# Patient Record
Sex: Female | Born: 1970 | Race: White | Hispanic: No | Smoking: Current every day smoker
Health system: Southern US, Community
[De-identification: ages and names within clinical notes are randomized; demographics above are authoritative.]

## PROBLEM LIST (undated history)

## (undated) DIAGNOSIS — Z720 Tobacco use: Secondary | ICD-10-CM

## (undated) DIAGNOSIS — E785 Hyperlipidemia, unspecified: Secondary | ICD-10-CM

## (undated) DIAGNOSIS — E669 Obesity, unspecified: Secondary | ICD-10-CM

## (undated) DIAGNOSIS — E119 Type 2 diabetes mellitus without complications: Secondary | ICD-10-CM

## (undated) DIAGNOSIS — K219 Gastro-esophageal reflux disease without esophagitis: Secondary | ICD-10-CM

## (undated) DIAGNOSIS — F32A Depression, unspecified: Secondary | ICD-10-CM

---

## 2020-11-25 HISTORY — PX: ABCESS DRAINAGE: SHX399

## 2020-12-28 ENCOUNTER — Inpatient Hospital Stay (HOSPITAL_COMMUNITY)
Admission: AD | Admit: 2020-12-28 | Discharge: 2021-01-19 | DRG: 853 | Disposition: A | Discharge status: Home Health | Payer: Medicaid Other | Source: Other Acute Inpatient Hospital | Attending: Family Medicine | Admitting: Family Medicine

## 2020-12-28 ENCOUNTER — Inpatient Hospital Stay (HOSPITAL_COMMUNITY): Payer: Medicaid Other | Admitting: Anesthesiology

## 2020-12-28 ENCOUNTER — Inpatient Hospital Stay (HOSPITAL_COMMUNITY): Payer: Medicaid Other

## 2020-12-28 ENCOUNTER — Encounter (HOSPITAL_COMMUNITY): Payer: Self-pay | Admitting: Critical Care Medicine

## 2020-12-28 ENCOUNTER — Encounter (HOSPITAL_COMMUNITY)
Admission: AD | Disposition: A | Discharge status: Home Health | Payer: Self-pay | Source: Other Acute Inpatient Hospital | Attending: Pulmonary Disease

## 2020-12-28 DIAGNOSIS — I071 Rheumatic tricuspid insufficiency: Secondary | ICD-10-CM | POA: Diagnosis present

## 2020-12-28 DIAGNOSIS — F17210 Nicotine dependence, cigarettes, uncomplicated: Secondary | ICD-10-CM | POA: Diagnosis present

## 2020-12-28 DIAGNOSIS — L03317 Cellulitis of buttock: Secondary | ICD-10-CM | POA: Diagnosis present

## 2020-12-28 DIAGNOSIS — A483 Toxic shock syndrome: Secondary | ICD-10-CM | POA: Diagnosis present

## 2020-12-28 DIAGNOSIS — B9620 Unspecified Escherichia coli [E. coli] as the cause of diseases classified elsewhere: Secondary | ICD-10-CM | POA: Diagnosis present

## 2020-12-28 DIAGNOSIS — R0609 Other forms of dyspnea: Secondary | ICD-10-CM | POA: Diagnosis not present

## 2020-12-28 DIAGNOSIS — N170 Acute kidney failure with tubular necrosis: Secondary | ICD-10-CM | POA: Diagnosis present

## 2020-12-28 DIAGNOSIS — Z9289 Personal history of other medical treatment: Secondary | ICD-10-CM | POA: Diagnosis not present

## 2020-12-28 DIAGNOSIS — I4891 Unspecified atrial fibrillation: Secondary | ICD-10-CM | POA: Diagnosis not present

## 2020-12-28 DIAGNOSIS — Y92239 Unspecified place in hospital as the place of occurrence of the external cause: Secondary | ICD-10-CM | POA: Diagnosis not present

## 2020-12-28 DIAGNOSIS — R1312 Dysphagia, oropharyngeal phase: Secondary | ICD-10-CM | POA: Diagnosis present

## 2020-12-28 DIAGNOSIS — E1165 Type 2 diabetes mellitus with hyperglycemia: Secondary | ICD-10-CM | POA: Diagnosis present

## 2020-12-28 DIAGNOSIS — A401 Sepsis due to streptococcus, group B: Principal | ICD-10-CM

## 2020-12-28 DIAGNOSIS — E785 Hyperlipidemia, unspecified: Secondary | ICD-10-CM | POA: Diagnosis present

## 2020-12-28 DIAGNOSIS — M7989 Other specified soft tissue disorders: Secondary | ICD-10-CM | POA: Diagnosis not present

## 2020-12-28 DIAGNOSIS — Z9049 Acquired absence of other specified parts of digestive tract: Secondary | ICD-10-CM

## 2020-12-28 DIAGNOSIS — Z79899 Other long term (current) drug therapy: Secondary | ICD-10-CM

## 2020-12-28 DIAGNOSIS — I5041 Acute combined systolic (congestive) and diastolic (congestive) heart failure: Secondary | ICD-10-CM | POA: Diagnosis not present

## 2020-12-28 DIAGNOSIS — E669 Obesity, unspecified: Secondary | ICD-10-CM

## 2020-12-28 DIAGNOSIS — F1120 Opioid dependence, uncomplicated: Secondary | ICD-10-CM | POA: Diagnosis present

## 2020-12-28 DIAGNOSIS — Z794 Long term (current) use of insulin: Secondary | ICD-10-CM

## 2020-12-28 DIAGNOSIS — R32 Unspecified urinary incontinence: Secondary | ICD-10-CM | POA: Diagnosis not present

## 2020-12-28 DIAGNOSIS — Z0189 Encounter for other specified special examinations: Secondary | ICD-10-CM

## 2020-12-28 DIAGNOSIS — D62 Acute posthemorrhagic anemia: Secondary | ICD-10-CM | POA: Diagnosis not present

## 2020-12-28 DIAGNOSIS — J9811 Atelectasis: Secondary | ICD-10-CM | POA: Diagnosis not present

## 2020-12-28 DIAGNOSIS — F32A Depression, unspecified: Secondary | ICD-10-CM | POA: Diagnosis present

## 2020-12-28 DIAGNOSIS — E871 Hypo-osmolality and hyponatremia: Secondary | ICD-10-CM | POA: Diagnosis present

## 2020-12-28 DIAGNOSIS — R34 Anuria and oliguria: Secondary | ICD-10-CM | POA: Diagnosis not present

## 2020-12-28 DIAGNOSIS — N179 Acute kidney failure, unspecified: Secondary | ICD-10-CM | POA: Diagnosis not present

## 2020-12-28 DIAGNOSIS — Z1611 Resistance to penicillins: Secondary | ICD-10-CM | POA: Diagnosis present

## 2020-12-28 DIAGNOSIS — E875 Hyperkalemia: Secondary | ICD-10-CM

## 2020-12-28 DIAGNOSIS — E874 Mixed disorder of acid-base balance: Secondary | ICD-10-CM | POA: Diagnosis present

## 2020-12-28 DIAGNOSIS — D6489 Other specified anemias: Secondary | ICD-10-CM | POA: Diagnosis present

## 2020-12-28 DIAGNOSIS — R680 Hypothermia, not associated with low environmental temperature: Secondary | ICD-10-CM | POA: Diagnosis not present

## 2020-12-28 DIAGNOSIS — W06XXXA Fall from bed, initial encounter: Secondary | ICD-10-CM | POA: Diagnosis not present

## 2020-12-28 DIAGNOSIS — Z8616 Personal history of COVID-19: Secondary | ICD-10-CM | POA: Diagnosis not present

## 2020-12-28 DIAGNOSIS — R509 Fever, unspecified: Secondary | ICD-10-CM | POA: Diagnosis not present

## 2020-12-28 DIAGNOSIS — A419 Sepsis, unspecified organism: Secondary | ICD-10-CM | POA: Diagnosis present

## 2020-12-28 DIAGNOSIS — E11649 Type 2 diabetes mellitus with hypoglycemia without coma: Secondary | ICD-10-CM | POA: Diagnosis not present

## 2020-12-28 DIAGNOSIS — M726 Necrotizing fasciitis: Secondary | ICD-10-CM | POA: Diagnosis not present

## 2020-12-28 DIAGNOSIS — I483 Typical atrial flutter: Secondary | ICD-10-CM | POA: Diagnosis not present

## 2020-12-28 DIAGNOSIS — Z4659 Encounter for fitting and adjustment of other gastrointestinal appliance and device: Secondary | ICD-10-CM

## 2020-12-28 DIAGNOSIS — K219 Gastro-esophageal reflux disease without esophagitis: Secondary | ICD-10-CM | POA: Diagnosis present

## 2020-12-28 DIAGNOSIS — Z6841 Body Mass Index (BMI) 40.0 and over, adult: Secondary | ICD-10-CM

## 2020-12-28 DIAGNOSIS — F419 Anxiety disorder, unspecified: Secondary | ICD-10-CM | POA: Diagnosis present

## 2020-12-28 DIAGNOSIS — J9601 Acute respiratory failure with hypoxia: Secondary | ICD-10-CM | POA: Diagnosis not present

## 2020-12-28 DIAGNOSIS — R6521 Severe sepsis with septic shock: Secondary | ICD-10-CM | POA: Diagnosis not present

## 2020-12-28 DIAGNOSIS — F1320 Sedative, hypnotic or anxiolytic dependence, uncomplicated: Secondary | ICD-10-CM | POA: Diagnosis present

## 2020-12-28 DIAGNOSIS — G9341 Metabolic encephalopathy: Secondary | ICD-10-CM

## 2020-12-28 DIAGNOSIS — Z452 Encounter for adjustment and management of vascular access device: Secondary | ICD-10-CM

## 2020-12-28 DIAGNOSIS — J9602 Acute respiratory failure with hypercapnia: Secondary | ICD-10-CM

## 2020-12-28 DIAGNOSIS — L0231 Cutaneous abscess of buttock: Secondary | ICD-10-CM | POA: Diagnosis present

## 2020-12-28 DIAGNOSIS — E878 Other disorders of electrolyte and fluid balance, not elsewhere classified: Secondary | ICD-10-CM | POA: Diagnosis present

## 2020-12-28 DIAGNOSIS — I4892 Unspecified atrial flutter: Secondary | ICD-10-CM | POA: Diagnosis not present

## 2020-12-28 DIAGNOSIS — I110 Hypertensive heart disease with heart failure: Secondary | ICD-10-CM | POA: Diagnosis present

## 2020-12-28 DIAGNOSIS — R197 Diarrhea, unspecified: Secondary | ICD-10-CM | POA: Diagnosis not present

## 2020-12-28 HISTORY — DX: Type 2 diabetes mellitus without complications: E11.9

## 2020-12-28 HISTORY — DX: Obesity, unspecified: E66.9

## 2020-12-28 HISTORY — DX: Gastro-esophageal reflux disease without esophagitis: K21.9

## 2020-12-28 HISTORY — PX: IRRIGATION AND DEBRIDEMENT BUTTOCKS: SHX6601

## 2020-12-28 HISTORY — DX: Tobacco use: Z72.0

## 2020-12-28 HISTORY — DX: Hyperlipidemia, unspecified: E78.5

## 2020-12-28 HISTORY — DX: Depression, unspecified: F32.A

## 2020-12-28 LAB — CBC
HCT: 32.7 % — ABNORMAL LOW (ref 36.0–46.0)
Hemoglobin: 11.4 g/dL — ABNORMAL LOW (ref 12.0–15.0)
MCH: 31.6 pg (ref 26.0–34.0)
MCHC: 34.9 g/dL (ref 30.0–36.0)
MCV: 90.6 fL (ref 80.0–100.0)
Platelets: 195 10*3/uL (ref 150–400)
RBC: 3.61 MIL/uL — ABNORMAL LOW (ref 3.87–5.11)
RDW: 13.5 % (ref 11.5–15.5)
WBC: 52.3 10*3/uL (ref 4.0–10.5)
nRBC: 0.1 % (ref 0.0–0.2)

## 2020-12-28 LAB — COMPREHENSIVE METABOLIC PANEL
ALT: 19 U/L (ref 0–44)
AST: 40 U/L (ref 15–41)
Albumin: 1.6 g/dL — ABNORMAL LOW (ref 3.5–5.0)
Alkaline Phosphatase: 422 U/L — ABNORMAL HIGH (ref 38–126)
Anion gap: 19 — ABNORMAL HIGH (ref 5–15)
BUN: 45 mg/dL — ABNORMAL HIGH (ref 6–20)
CO2: 12 mmol/L — ABNORMAL LOW (ref 22–32)
Calcium: 8.7 mg/dL — ABNORMAL LOW (ref 8.9–10.3)
Chloride: 100 mmol/L (ref 98–111)
Creatinine, Ser: 5.97 mg/dL — ABNORMAL HIGH (ref 0.44–1.00)
GFR, Estimated: 8 mL/min — ABNORMAL LOW (ref >60)
Glucose, Bld: 133 mg/dL — ABNORMAL HIGH (ref 70–99)
Potassium: 4 mmol/L (ref 3.5–5.1)
Sodium: 131 mmol/L — ABNORMAL LOW (ref 135–145)
Total Bilirubin: 6.6 mg/dL — ABNORMAL HIGH (ref 0.3–1.2)
Total Protein: 6.1 g/dL — ABNORMAL LOW (ref 6.5–8.1)

## 2020-12-28 LAB — LACTIC ACID, PLASMA
Lactic Acid, Venous: 3.3 mmol/L (ref 0.5–1.9)
Lactic Acid, Venous: 3.4 mmol/L (ref 0.5–1.9)

## 2020-12-28 LAB — POCT I-STAT 7, (LYTES, BLD GAS, ICA,H+H)
Acid-base deficit: 14 mmol/L — ABNORMAL HIGH (ref 0.0–2.0)
Bicarbonate: 16.8 mmol/L — ABNORMAL LOW (ref 20.0–28.0)
Calcium, Ion: 1.12 mmol/L — ABNORMAL LOW (ref 1.15–1.40)
HCT: 36 % (ref 36.0–46.0)
Hemoglobin: 12.2 g/dL (ref 12.0–15.0)
O2 Saturation: 99 %
Patient temperature: 97.6
Potassium: 3.8 mmol/L (ref 3.5–5.1)
Sodium: 135 mmol/L (ref 135–145)
TCO2: 19 mmol/L — ABNORMAL LOW (ref 22–32)
pCO2 arterial: 63.1 mmHg — ABNORMAL HIGH (ref 32.0–48.0)
pH, Arterial: 7.03 — CL (ref 7.350–7.450)
pO2, Arterial: 169 mmHg — ABNORMAL HIGH (ref 83.0–108.0)

## 2020-12-28 LAB — GLUCOSE, CAPILLARY
Glucose-Capillary: 152 mg/dL — ABNORMAL HIGH (ref 70–99)
Glucose-Capillary: 195 mg/dL — ABNORMAL HIGH (ref 70–99)

## 2020-12-28 LAB — HIV ANTIBODY (ROUTINE TESTING W REFLEX): HIV Screen 4th Generation wRfx: NONREACTIVE

## 2020-12-28 LAB — TYPE AND SCREEN
ABO/RH(D): O NEG
Antibody Screen: NEGATIVE

## 2020-12-28 LAB — BRAIN NATRIURETIC PEPTIDE: B Natriuretic Peptide: 1323.6 pg/mL — ABNORMAL HIGH (ref 0.0–100.0)

## 2020-12-28 LAB — PROTIME-INR
INR: 1.3 — ABNORMAL HIGH (ref 0.8–1.2)
Prothrombin Time: 16.4 seconds — ABNORMAL HIGH (ref 11.4–15.2)

## 2020-12-28 LAB — PROCALCITONIN: Procalcitonin: 17.91 ng/mL

## 2020-12-28 LAB — MRSA PCR SCREENING: MRSA by PCR: NEGATIVE

## 2020-12-28 LAB — MAGNESIUM: Magnesium: 1.6 mg/dL — ABNORMAL LOW (ref 1.7–2.4)

## 2020-12-28 LAB — ABO/RH: ABO/RH(D): O NEG

## 2020-12-28 SURGERY — IRRIGATION AND DEBRIDEMENT BUTTOCKS
Anesthesia: General | Site: Buttocks | Laterality: Left

## 2020-12-28 MED ORDER — NOREPINEPHRINE 4 MG/250ML-% IV SOLN: 0.0000 ug/min | INTRAVENOUS | Status: DC

## 2020-12-28 MED ORDER — LIDOCAINE 2% (20 MG/ML) 5 ML SYRINGE
INTRAMUSCULAR | Status: AC
  Filled 2020-12-28: qty 5

## 2020-12-28 MED ORDER — PROPOFOL 10 MG/ML IV BOLUS
INTRAVENOUS | Status: AC
  Filled 2020-12-28: qty 20

## 2020-12-28 MED ORDER — FENTANYL CITRATE (PF) 100 MCG/2ML IJ SOLN
INTRAMUSCULAR | Status: AC
  Administered 2020-12-28: 50 ug
  Filled 2020-12-28: qty 2

## 2020-12-28 MED ORDER — LIDOCAINE HCL (CARDIAC) PF 100 MG/5ML IV SOSY
PREFILLED_SYRINGE | INTRAVENOUS | Status: DC | PRN
  Administered 2020-12-28: 60 mg via INTRAVENOUS

## 2020-12-28 MED ORDER — SODIUM BICARBONATE 8.4 % IV SOLN
200.0000 meq | Freq: Once | INTRAVENOUS | Status: AC
  Administered 2020-12-28: 200 meq via INTRAVENOUS

## 2020-12-28 MED ORDER — SODIUM CHLORIDE 0.9 % IV SOLN
1.0000 g | Freq: Three times a day (TID) | INTRAVENOUS | Status: DC
  Filled 2020-12-28 (×3): qty 1

## 2020-12-28 MED ORDER — MIDAZOLAM HCL 2 MG/2ML IJ SOLN
INTRAMUSCULAR | Status: AC
  Filled 2020-12-28: qty 2

## 2020-12-28 MED ORDER — SODIUM CHLORIDE 0.9 % IV SOLN
250.0000 mL | INTRAVENOUS | Status: DC
  Administered 2020-12-28: 10 mL via INTRAVENOUS
  Administered 2021-01-01: 250 mL via INTRAVENOUS
  Administered 2021-01-01: 1000 mL via INTRAVENOUS
  Administered 2021-01-05 – 2021-01-07 (×2): 250 mL via INTRAVENOUS

## 2020-12-28 MED ORDER — ROCURONIUM BROMIDE 100 MG/10ML IV SOLN
INTRAVENOUS | Status: DC | PRN
  Administered 2020-12-28: 50 mg via INTRAVENOUS
  Administered 2020-12-28: 20 mg via INTRAVENOUS

## 2020-12-28 MED ORDER — PHENYLEPHRINE HCL (PRESSORS) 10 MG/ML IV SOLN
INTRAVENOUS | Status: DC | PRN
  Administered 2020-12-28 (×2): 80 ug via INTRAVENOUS

## 2020-12-28 MED ORDER — DOCUSATE SODIUM 100 MG PO CAPS: 100.0000 mg | ORAL_CAPSULE | Freq: Two times a day (BID) | ORAL | Status: DC | PRN

## 2020-12-28 MED ORDER — SODIUM BICARBONATE 8.4 % IV SOLN
INTRAVENOUS | Status: AC
  Filled 2020-12-28: qty 200

## 2020-12-28 MED ORDER — INSULIN ASPART 100 UNIT/ML IJ SOLN
0.0000 [IU] | INTRAMUSCULAR | Status: DC
  Administered 2020-12-28 – 2020-12-30 (×9): 3 [IU] via SUBCUTANEOUS
  Administered 2020-12-30 (×5): 5 [IU] via SUBCUTANEOUS
  Administered 2020-12-31: 3 [IU] via SUBCUTANEOUS
  Administered 2020-12-31: 8 [IU] via SUBCUTANEOUS

## 2020-12-28 MED ORDER — FENTANYL CITRATE (PF) 250 MCG/5ML IJ SOLN
INTRAMUSCULAR | Status: AC
  Filled 2020-12-28: qty 5

## 2020-12-28 MED ORDER — LINEZOLID 600 MG/300ML IV SOLN
600.0000 mg | Freq: Two times a day (BID) | INTRAVENOUS | Status: DC
  Administered 2020-12-28 – 2021-01-01 (×9): 600 mg via INTRAVENOUS
  Filled 2020-12-28 (×11): qty 300

## 2020-12-28 MED ORDER — HEPARIN (PORCINE) 2000 UNITS/L FOR CRRT
INTRAVENOUS_CENTRAL | Status: DC | PRN
  Filled 2020-12-28 (×5): qty 1000

## 2020-12-28 MED ORDER — ONDANSETRON HCL 4 MG/2ML IJ SOLN: 4.0000 mg | Freq: Four times a day (QID) | INTRAMUSCULAR | Status: DC | PRN

## 2020-12-28 MED ORDER — DEXAMETHASONE SODIUM PHOSPHATE 10 MG/ML IJ SOLN
INTRAMUSCULAR | Status: DC | PRN
  Administered 2020-12-28: 5 mg via INTRAVENOUS

## 2020-12-28 MED ORDER — 0.9 % SODIUM CHLORIDE (POUR BTL) OPTIME
TOPICAL | Status: DC | PRN
  Administered 2020-12-28: 1000 mL

## 2020-12-28 MED ORDER — SODIUM BICARBONATE 8.4 % IV SOLN
50.0000 meq | Freq: Once | INTRAVENOUS | Status: AC
  Administered 2020-12-28: 50 meq via INTRAVENOUS
  Filled 2020-12-28: qty 50

## 2020-12-28 MED ORDER — FENTANYL CITRATE (PF) 100 MCG/2ML IJ SOLN
INTRAMUSCULAR | Status: DC | PRN
  Administered 2020-12-28: 150 ug via INTRAVENOUS
  Administered 2020-12-28: 50 ug via INTRAVENOUS

## 2020-12-28 MED ORDER — NOREPINEPHRINE 4 MG/250ML-% IV SOLN
0.0000 ug/min | INTRAVENOUS | Status: DC
  Administered 2020-12-28: 13 ug/min via INTRAVENOUS

## 2020-12-28 MED ORDER — POLYETHYLENE GLYCOL 3350 17 G PO PACK: 17.0000 g | PACK | Freq: Every day | ORAL | Status: DC | PRN

## 2020-12-28 MED ORDER — STERILE WATER FOR INJECTION IV SOLN
INTRAVENOUS | Status: DC
  Filled 2020-12-28 (×2): qty 1000

## 2020-12-28 MED ORDER — PRISMASOL BGK 4/2.5 32-4-2.5 MEQ/L REPLACEMENT SOLN
Status: DC
  Filled 2020-12-28 (×3): qty 5000

## 2020-12-28 MED ORDER — PROPOFOL 10 MG/ML IV BOLUS
INTRAVENOUS | Status: DC | PRN
  Administered 2020-12-28: 150 mg via INTRAVENOUS

## 2020-12-28 MED ORDER — FENTANYL CITRATE (PF) 100 MCG/2ML IJ SOLN
25.0000 ug | INTRAMUSCULAR | Status: DC | PRN
  Administered 2020-12-28 – 2020-12-29 (×3): 50 ug via INTRAVENOUS

## 2020-12-28 MED ORDER — SODIUM BICARBONATE 8.4 % IV SOLN
INTRAVENOUS | Status: AC
  Filled 2020-12-28: qty 50

## 2020-12-28 MED ORDER — HEPARIN SODIUM (PORCINE) 5000 UNIT/ML IJ SOLN
5000.0000 [IU] | Freq: Three times a day (TID) | INTRAMUSCULAR | Status: DC
  Administered 2020-12-28 – 2021-01-08 (×32): 5000 [IU] via SUBCUTANEOUS
  Filled 2020-12-28 (×32): qty 1

## 2020-12-28 MED ORDER — HEPARIN SODIUM (PORCINE) 1000 UNIT/ML DIALYSIS
1000.0000 [IU] | INTRAMUSCULAR | Status: DC | PRN
  Administered 2020-12-28: 1000 [IU] via INTRAVENOUS_CENTRAL
  Administered 2020-12-31 – 2021-01-03 (×2): 2400 [IU] via INTRAVENOUS_CENTRAL
  Filled 2020-12-28: qty 6
  Filled 2020-12-28: qty 5
  Filled 2020-12-28 (×2): qty 6
  Filled 2020-12-28: qty 3
  Filled 2020-12-28: qty 4
  Filled 2020-12-28: qty 6

## 2020-12-28 MED ORDER — ORAL CARE MOUTH RINSE
15.0000 mL | OROMUCOSAL | Status: DC
  Administered 2020-12-28 – 2021-01-06 (×85): 15 mL via OROMUCOSAL

## 2020-12-28 MED ORDER — SODIUM CHLORIDE 0.9 % IV SOLN
1.0000 g | INTRAVENOUS | Status: DC
  Filled 2020-12-28: qty 1

## 2020-12-28 MED ORDER — VASOPRESSIN 20 UNITS/100 ML INFUSION FOR SHOCK
0.0000 [IU]/min | INTRAVENOUS | Status: DC
Start: 2020-12-28 — End: 2020-12-31
  Administered 2020-12-28 – 2020-12-30 (×4): 0.03 [IU]/min via INTRAVENOUS
  Filled 2020-12-28 (×4): qty 100

## 2020-12-28 MED ORDER — CHLORHEXIDINE GLUCONATE CLOTH 2 % EX PADS
6.0000 | MEDICATED_PAD | Freq: Every day | CUTANEOUS | Status: DC
  Administered 2020-12-28 – 2021-01-18 (×21): 6 via TOPICAL

## 2020-12-28 MED ORDER — STERILE WATER FOR IRRIGATION IR SOLN
Status: DC | PRN
  Administered 2020-12-28: 300 mL

## 2020-12-28 MED ORDER — CHLORHEXIDINE GLUCONATE 0.12% ORAL RINSE (MEDLINE KIT)
15.0000 mL | Freq: Two times a day (BID) | OROMUCOSAL | Status: DC
  Administered 2020-12-28 – 2021-01-06 (×18): 15 mL via OROMUCOSAL

## 2020-12-28 MED ORDER — ONDANSETRON HCL 4 MG/2ML IJ SOLN
INTRAMUSCULAR | Status: DC | PRN
  Administered 2020-12-28: 4 mg via INTRAVENOUS

## 2020-12-28 MED ORDER — FENTANYL 2500MCG IN NS 250ML (10MCG/ML) PREMIX INFUSION
0.0000 ug/h | INTRAVENOUS | Status: DC
  Administered 2020-12-28: 50 ug/h via INTRAVENOUS
  Administered 2020-12-29: 400 ug/h via INTRAVENOUS
  Filled 2020-12-28 (×2): qty 250

## 2020-12-28 MED ORDER — SODIUM CHLORIDE 0.9 % IV SOLN: INTRAVENOUS | Status: DC | PRN

## 2020-12-28 MED ORDER — MIDAZOLAM HCL 5 MG/5ML IJ SOLN
INTRAMUSCULAR | Status: DC | PRN
  Administered 2020-12-28 (×2): 1 mg via INTRAVENOUS
  Administered 2020-12-28: 2 mg via INTRAVENOUS

## 2020-12-28 MED ORDER — PRISMASOL BGK 4/2.5 32-4-2.5 MEQ/L EC SOLN
Status: DC
  Filled 2020-12-28 (×46): qty 5000

## 2020-12-28 MED ORDER — SODIUM BICARBONATE 8.4 % IV SOLN
INTRAVENOUS | Status: DC | PRN
  Administered 2020-12-28: 50 mL via INTRAVENOUS

## 2020-12-28 MED ORDER — SODIUM CHLORIDE 0.9 % IV SOLN
1.0000 g | Freq: Three times a day (TID) | INTRAVENOUS | Status: DC
  Administered 2020-12-28 – 2020-12-31 (×8): 1 g via INTRAVENOUS
  Filled 2020-12-28 (×10): qty 1

## 2020-12-28 MED ORDER — MAGNESIUM SULFATE 2 GM/50ML IV SOLN
2.0000 g | Freq: Once | INTRAVENOUS | Status: AC
  Administered 2020-12-28: 2 g via INTRAVENOUS
  Filled 2020-12-28: qty 50

## 2020-12-28 MED ORDER — NOREPINEPHRINE 4 MG/250ML-% IV SOLN
2.0000 ug/min | INTRAVENOUS | Status: DC
  Administered 2020-12-28: 12 ug/min via INTRAVENOUS
  Filled 2020-12-28 (×2): qty 250

## 2020-12-28 MED ORDER — PRISMASOL BGK 4/2.5 32-4-2.5 MEQ/L REPLACEMENT SOLN
Status: DC
  Filled 2020-12-28 (×12): qty 5000

## 2020-12-28 SURGICAL SUPPLY — 47 items
BLADE SURG 10 STRL SS (BLADE) ×3 IMPLANT
BLADE SURG 15 STRL LF DISP TIS (BLADE) ×1 IMPLANT
BLADE SURG 15 STRL SS (BLADE) ×2
BNDG GAUZE ELAST 4 BULKY (GAUZE/BANDAGES/DRESSINGS) ×3 IMPLANT
CANISTER SUCT 3000ML PPV (MISCELLANEOUS) ×3 IMPLANT
CLEANER TIP ELECTROSURG 2X2 (MISCELLANEOUS) ×3 IMPLANT
CNTNR URN SCR LID CUP LEK RST (MISCELLANEOUS) ×2 IMPLANT
CONT SPEC 4OZ STRL OR WHT (MISCELLANEOUS) ×4
COVER SURGICAL LIGHT HANDLE (MISCELLANEOUS) ×3 IMPLANT
COVER WAND RF STERILE (DRAPES) IMPLANT
DRAPE LAPAROSCOPIC ABDOMINAL (DRAPES) ×3 IMPLANT
DRAPE UTILITY XL STRL (DRAPES) ×3 IMPLANT
DRSG PAD ABDOMINAL 8X10 ST (GAUZE/BANDAGES/DRESSINGS) ×3 IMPLANT
ELECT CAUTERY BLADE 6.4 (BLADE) ×3 IMPLANT
ELECT REM PT RETURN 9FT ADLT (ELECTROSURGICAL) ×3
ELECTRODE REM PT RTRN 9FT ADLT (ELECTROSURGICAL) ×1 IMPLANT
GAUZE 4X4 16PLY RFD (DISPOSABLE) ×3 IMPLANT
GAUZE PACKING IODOFORM 1X5 (PACKING) IMPLANT
GAUZE SPONGE 4X4 12PLY STRL (GAUZE/BANDAGES/DRESSINGS) IMPLANT
GAUZE SPONGE 4X4 12PLY STRL LF (GAUZE/BANDAGES/DRESSINGS) ×6 IMPLANT
GLOVE BIO SURGEON STRL SZ8 (GLOVE) ×3 IMPLANT
GLOVE SRG 8 PF TXTR STRL LF DI (GLOVE) ×2 IMPLANT
GLOVE SURG UNDER POLY LF SZ7 (GLOVE) ×3 IMPLANT
GLOVE SURG UNDER POLY LF SZ8 (GLOVE) ×4
GOWN STRL REUS W/ TWL LRG LVL3 (GOWN DISPOSABLE) ×2 IMPLANT
GOWN STRL REUS W/ TWL XL LVL3 (GOWN DISPOSABLE) ×1 IMPLANT
GOWN STRL REUS W/TWL LRG LVL3 (GOWN DISPOSABLE) ×4
GOWN STRL REUS W/TWL XL LVL3 (GOWN DISPOSABLE) ×2
KIT BASIN OR (CUSTOM PROCEDURE TRAY) ×3 IMPLANT
KIT TURNOVER KIT B (KITS) ×3 IMPLANT
MARKER SKIN DUAL TIP RULER LAB (MISCELLANEOUS) ×3 IMPLANT
NS IRRIG 1000ML POUR BTL (IV SOLUTION) ×3 IMPLANT
PACK LITHOTOMY IV (CUSTOM PROCEDURE TRAY) ×3 IMPLANT
PAD ARMBOARD 7.5X6 YLW CONV (MISCELLANEOUS) ×3 IMPLANT
PENCIL SMOKE EVACUATOR (MISCELLANEOUS) ×3 IMPLANT
SOL PREP POV-IOD 4OZ 10% (MISCELLANEOUS) ×3 IMPLANT
SPONGE LAP 18X18 RF (DISPOSABLE) ×9 IMPLANT
SUT SILK 2 0 SH (SUTURE) ×3 IMPLANT
SWAB COLLECTION DEVICE MRSA (MISCELLANEOUS) ×3 IMPLANT
SWAB CULTURE ESWAB REG 1ML (MISCELLANEOUS) ×3 IMPLANT
SYR BULB IRRIG 60ML STRL (SYRINGE) ×3 IMPLANT
TOWEL GREEN STERILE (TOWEL DISPOSABLE) ×3 IMPLANT
TOWEL GREEN STERILE FF (TOWEL DISPOSABLE) ×3 IMPLANT
TUBE CONNECTING 12'X1/4 (SUCTIONS) ×1
TUBE CONNECTING 12X1/4 (SUCTIONS) ×2 IMPLANT
UNDERPAD 30X36 HEAVY ABSORB (UNDERPADS AND DIAPERS) ×3 IMPLANT
YANKAUER SUCT BULB TIP NO VENT (SUCTIONS) ×3 IMPLANT

## 2020-12-28 NOTE — Anesthesia Procedure Notes (Signed)
Arterial Line Insertion Start/End6/09/2020 7:55 AM, 12/28/2020 8:00 AM Performed by: Atilano Median, DO, anesthesiologist  Patient location: OR. Preanesthetic checklist: patient identified, IV checked, site marked, risks and benefits discussed, surgical consent, monitors and equipment checked, pre-op evaluation, timeout performed and anesthesia consent Lidocaine 1% used for infiltration Left, radial was placed Catheter size: 20 Fr Hand hygiene performed  and maximum sterile barriers used   Attempts: 1 Procedure performed without using ultrasound guided technique. Following insertion, dressing applied and Biopatch. Post procedure assessment: normal and unchanged  Post procedure complications: second provider assisted. Patient tolerated the procedure well with no immediate complications. Additional procedure comments: Initial attempt CRNA team unsuccessful .

## 2020-12-28 NOTE — Progress Notes (Signed)
Patient returned to 2M09 on ventilator and with a Left radial art line. Patient is on Levophed and vaso. Report taken from OR nurse.

## 2020-12-28 NOTE — Consult Note (Signed)
Reason for Consult: Acute kidney injury Referring Physician: (CCM)  HPI:  50 year old Caucasian woman with past medical history significant for type 2 diabetes mellitus not on medications following significant weight loss, obesity, depression/anxiety, gastroesophageal reflux disease and dyslipidemia.  She was admitted to [facility] on 12/23/2020 for management of left buttock cellulitis with associated septic shock.  CT scan of the pelvis with intravenous contrast at that time showed some subcutaneous gas but without an abscess and she had an incision and drainage/wound packing with initiation of intravenous vancomycin and ceftriaxone.  Wound culture grew group B streptococci (strep agalactiae).  It appears that she was taking meloxicam prior to admission that was appropriately held.  She was transferred for worsening renal failure/sepsis.  Renal ultrasound on 6/1 did not show any hydronephrosis or mass.  Urine output and creatinine trend has been as follows: 5/29 0.4 5/30 0.8 5/31 2.6 6/1 4.0 (UOP 125 cc) 6/2 4.3 (UOP 17 cc) 6/3 5.1 (UOP 20 cc)   Past medical history: 1.  Type 2 diabetes mellitus-diet controlled 2.  Obesity 3.  Depression 4.  Dyslipidemia 5.  Gastroesophageal reflux disease 6.  Ongoing tobacco use  Past surgical history: History of cholecystectomy  No family history on file.  Social History: Active cigarette smoker  Allergies: No known drug allergies  Medications:  Scheduled: . heparin  5,000 Units Subcutaneous Q8H  . insulin aspart  0-15 Units Subcutaneous Q4H    No results found for this or any previous visit (from the past 48 hour(s)). Labs from earlier today: Sodium 132, potassium 3.3, bicarbonate 10, BUN 40, creatinine 5.1, glucose 110, osmolality 266, calcium 8.5, lactic acid 3.9, hemoglobin 11.9, hematocrit 35.3, WBC 34,100, platelets 157,000  No results found.  Review of Systems  Unable to perform ROS: Acuity of condition (Patient  intermittently crying out in pain)   Blood pressure (!) 104/48, pulse 87, temperature 98.3 F (36.8 C), temperature source Oral, resp. rate 20, height 5\' 2"  (1.575 m), weight 104.3 kg, SpO2 96 %. Physical Exam Vitals and nursing note reviewed.  Constitutional:      Appearance: She is obese. She is ill-appearing.     Comments: Crying out intermittently in pain  HENT:     Head: Normocephalic and atraumatic.     Right Ear: External ear normal.     Left Ear: External ear normal.     Nose: Nose normal.     Mouth/Throat:     Mouth: Mucous membranes are dry.  Eyes:     General: No scleral icterus.    Extraocular Movements: Extraocular movements intact.     Conjunctiva/sclera: Conjunctivae normal.  Cardiovascular:     Rate and Rhythm: Normal rate and regular rhythm.     Pulses: Normal pulses.     Heart sounds: No murmur heard.   Pulmonary:     Effort: Pulmonary effort is normal.     Breath sounds: Normal breath sounds. No wheezing or rales.  Abdominal:     General: Abdomen is flat.     Palpations: Abdomen is soft.     Tenderness: There is no abdominal tenderness. There is no guarding.  Musculoskeletal:     Cervical back: Normal range of motion and neck supple.     Right lower leg: Edema present.     Left lower leg: Edema present.     Comments: Trace bilateral lower extremity edema  Skin:    General: Skin is warm and dry.     Findings: Lesion and rash present.  Comments: Large area of erythema over left buttock extending into lower back and down thigh     Assessment/Plan: 1.  Acute kidney injury: Appears to be from the predominating etiology of ATN associated with sepsis.  She has had some iodinated intravenous contrast exposure as well as vancomycin and these may have contributed to injury as well.  I will check a random vancomycin level.  Renal ultrasound did not show any obstruction and I do not have a urinalysis available to me.  I have ordered urinalysis/urine  electrolytes but this may be difficult to obtain with her anuric state.  Given her profound metabolic acidosis and hemodynamic state, will begin CRRT. Avoid nephrotoxic medications including NSAIDs and iodinated intravenous contrast exposure unless the latter is absolutely indicated.  Preferred narcotic agents for pain control are hydromorphone, fentanyl, and methadone. Morphine should not be used. Avoid Baclofen and avoid oral sodium phosphate and magnesium citrate based laxatives / bowel preps. Continue strict Input and Output monitoring. Will monitor the patient closely with you and intervene or adjust therapy as indicated by changes in clinical status/labs. 2.  Anion gap metabolic acidosis: Secondary to acute kidney injury/lactic acidosis and associated sepsis.  Management with CRRT. 3.  Hyponatremia: Secondary to acute kidney injury and impaired free water handling.  Monitor with CRRT. 4.  Left gluteal cellulitis/possible abscess:  Awaiting surgical evaluation for incision and drainage.  She has been switched to linezolid and meropenem and remains on Levophed for sepsis/hypotension. 5.  Type 2 diabetes mellitus: Management per critical care protocol/service.   [name] Mannina K. 12/28/2020, 4:42 PM

## 2020-12-28 NOTE — H&P (Signed)
Please see CCM consult note dated 12/28/20.  Steffanie Dunn, DO 12/28/20 6:54 PM [facility]  For contact information, see Amion. If no response to pager, please call PCCM consult pager. After hours, 7PM- 7AM, please call Elink.

## 2020-12-28 NOTE — Consult Note (Signed)
[name] [name] [birth year]  [id number].    Requesting MD: Dr. Karie Fetch Chief Complaint/Reason for Consult: Soft tissue infection of the left buttock  HPI: [name] [name] is a 50 y.o. female w/ a hx of HTN, DM2 (not taking any current medications prior to admission) and GERD who was transferred from [facility] for soft tissue infection.   Patient reportedly admitted to [facility] on 5/29 after presenting with fevers and pain/ulceration to her left buttocks that had been gradually worsening over the 3 weeks prior to presentation. She did not try anything for this at home. No hx of similar in the past. She had a CT of the pelvis that subq gas, no fluid collection. An I&D was performed, cx sent and started on IV Vanc and grew Group B strep. During admission 1/4 blood cx's grew GPC in anaerobic blood bottle. Wound cx w/ GBS. Abx changed to IV Rocephin on 6/1. During admission she had worsening leukocytosis, renal failure, metabolic acidosis and encephalopathy for which she was transferred to the [facility] service at [facility] on 6/3.Marland Kitchen Upon arrival, our team was notified and asked to see. Currently the patient is confused, and unable to give hx. She is hypotensive -> being started on Levo and IVF resucitation. Abx have been changed to Linezolid.   Blood thinners: None  ROS: Review of Systems  Unable to perform ROS: Mental status change    No family history on file.  No past medical history on file. Past Medical Hx: HTN, DM2 (not taking any current medications prior to admission) and GERD  Past Surgical Hx: Cholecystectomy  Social History:  has no history on file for tobacco use, alcohol use, and drug use.  Allergies: Not on File  No medications prior to admission.     Physical Exam: Blood pressure (!) 104/48, pulse 87, temperature 98.3 F (36.8 C), temperature source Oral, resp. rate 20, height 5\' 2"  (1.575 m), weight 104.3 kg, SpO2 96 %. General: ill appearing WD/WN,  obese female who is laying confused HEENT: head is normocephalic, atraumatic.  Sclera are mildly injected  PERRL.  Ears and nose without any masses or lesions.  Mouth is pink and moist. Dentition fair Heart: regular, rate, and rhythm.  Normal s1,s2. No obvious murmurs, gallops, or rubs noted.  Palpable pedal pulses bilaterally  Lungs: CTAB, no wheezes, rhonchi, or rales noted.  Respiratory effort nonlabored Abd: Soft, mildly tender, but difficult to really tell due to mental status, +BS, no masses, hernias, or organomegaly MS: all 4 extremities are symmetrical with no cyanosis, clubbing, but does have BLE edema. Psych: Confused Neuro: cranial nerves grossly intact, moves all extremites, encephalopathic. Skin: Proximal to the gluteal fold there is an area of ischemia and ulcerations.  This is draining purple/reddish fluid.  The area pictured below is boggy and macerated.  She has erythema and induration that extends all the way up her left gluteus.      No results found for this or any previous visit (from the past 48 hour(s)). No results found.    Assessment/Plan Necrotizing soft tissue infection of the left buttock/posterior thigh  This is a 50 y.o. female admitted to [facility] on 5/29 after presenting w/ 3 weeks of left buttock pain/ulceration and CT of the pelvis showed subq gas, no fluid collection. An I&D was performed, cx sent and started on IV Vanc. During admission 1/4 blood cx's grew GPC in anaerobic blood bottle. Wound cx w/ Group B strep. Abx changed to IV  Rocephin on 6/1. Patient had worsening leukocytosis, renal failure, metabolic acidosis and encephalopathy for which she was transferred to the [facility] service at [facility] on 6/3. Currently is confused being started on Levo and IVF resucitation. Abx have been changed to Linezolid.  - Agree with IV abx - Will plan for OR tonight for I&D with Dr. Janee Morn - Please keep NPO - Continue resuscitation per PCCM  FEN - NPO, IVF per  PCCM VTE - SCDs, heparin subq ID - Per notes (Vanc 5/29 - 6/1. Rocephin 6/1 - 6/3.) Linezolid 6/3 >>. Cx from I&D at [facility] w/ GBS Foley - Per CCM  Hx HTN Hx of DM2  Hx of GERD AKI  Encephalopathy  Anion gap metabolic acidosis  Letha Cape, [facility] 12/28/2020, 4:27 PM Please see Amion for pager number during day hours 7:00am-4:30pm

## 2020-12-28 NOTE — Op Note (Signed)
  12/28/2020  8:55 PM  PATIENT:  [name] [name]  50 y.o. female  PRE-OPERATIVE DIAGNOSIS:  NECROTIZING SOFT TISSUE INFECTION OF LEFT BUTTOCK  POST-OPERATIVE DIAGNOSIS: NECROTIZING SOFT TISSUE INFECTION OF LEFT BUTTOCK  PROCEDURE:  Procedure(s): DEBRIDEMENT LEFT BUTTOCK NECROTIZING SOFT TISSUE INFECTION - TOTAL TISSUE VOLUME 14CM X 12CM X 3CM DEEP  SURGEON:  Surgeon(s): Violeta Gelinas, MD  ASSISTANTS: none   ANESTHESIA:   general  EBL:  No intake/output data recorded.  BLOOD ADMINISTERED:none  DRAINS: none   SPECIMEN:  Excision  DISPOSITION OF SPECIMEN:  PATHOLOGY  COUNTS:  YES  DICTATION: .Dragon Dictation Excisional debridement:  1.  Patient is brought for emergent debridement of necrotizing soft tissue infection of left buttock.  Her site was marked.  Informed consent was obtained.  She is on IV antibiotics and being cared for by the critical care medicine team.  She was brought directly from the ICU to the operating room.  General anesthesia was administered by the anesthesia staff.  She was placed in lithotomy position and her buttocks area was prepped and draped in a sterile fashion.  We did a timeout procedure.  I used cautery to make a circular incision over the initial 6 x 6 cm area of necrotic skin.  I entered a pocket of pus which I sent for cultures.  I continued to debride further as I encountered more dead fat and pockets of purulence.  Hemostasis was obtained with cautery and I also placed a couple 2-0 silk figure-of-eight sutures medially.  I finally got back to viable tissue all around.  This was superficial to the fascia.  I got down to some viable fat all around.  Area was copiously irrigated with a large volume of saline.  Hemostasis was ensured.  There was no further pockets of purulence and remaining tissue was viable.  Total volume of the wound at the end of this was 14 cm x 12 cm x 3 cm deep.  It was packed with saline soaked Kerlix followed by sterile  dressing.  All counts were correct.  She tolerated the procedure without apparent complication and was taken directly back to the ICU in critical condition.  2.  Tool used for debridement (curette, scapel, etc.)  cautery  3.  Frequency of surgical debridement.   first  4.  Measurement of total devitalized tissue (wound surface) before and after surgical debridement.   6cm x 6cm at skin surface, 14cm x 12cm x 3cm deep at end  5.  Area and depth of devitalized tissue removed from wound.  14cm x 12cm x 3cm deep  6.  Blood loss and description of tissue removed.  200cc, dead purulent skin and fat  7.  Evidence of the progress of the wound's response to treatment.  A.  Current wound volume (current dimensions and depth).  14cm x 12cm x 3cm deep  B.  Presence (and extent of) of infection.  present  C.  Presence (and extent of) of non viable tissue.  All debrided  D.  Other material in the wound that is expected to inhibit healing.  none  8.  Was there any viable tissue removed (measurements): minimal along edges  PATIENT DISPOSITION:  ICU   Delay start of Pharmacological VTE agent (>24hrs) due to surgical blood loss or risk of bleeding:  no  Violeta Gelinas, MD, MPH, FACS Pager: [phone]  6/3/20228:55 PM

## 2020-12-28 NOTE — Consult Note (Addendum)
NAME:  [name] [name], MRN:  [id number], DOB:  [birth year], LOS: 0 ADMISSION DATE:  12/28/2020, CONSULTATION DATE:  6/3 REFERRING MD:  Duke Salvia MD, CHIEF COMPLAINT:     History of Present Illness:  50 year old female who was initially admitted to [facility] on 5/29 w/ cc: fever,  pain and ulceration involving fluid filled mass on left buttocks. She has hx of DM type 2 not on medication. Initially noted about 3 weeks prior. In ER at [facility] a CT of pelvis was obtained. This showed [place] gas but no abscess. An I&D was obtained by EDP, culture sent, IV vanc started. IVFs started. Admitted.  On admission: 1/4 GPC in anaerobic bottle blood.  Marked leukocytosis Na 134 + anion gap metabolic acidosis & + elevated lactic acid 5/31 culture from wound growing group B strep, vanc changed to Ceftriaxone.  Over course of hospital stay had progressive leukocytosis, worsening renal failure, metabolic acidosis, encephalopathy. She was to be transferred to [facility] and accepted to medical service as she was continuing to decline. Overnight on 6/2 into the morning of 6/3 developed hypotension requiring pressors. PCCM asked to admit   Pertinent  Medical History  Recent Vulvar irritation and Bartholin Cyst seen at [facility]  HTN DM type II Cholecystectomy  GERD Anxiety    Significant Hospital Events: Including procedures, antibiotic start and stop dates in addition to other pertinent events   . 5/29 admitted to [place] sepsis felt 2/2 cellulitis on buttocks. Cultures sent. CT abd/pelvis + [place] gas but no drainable abscess. Started on Vanc. IVFs. Wound also cultured.  . 6/1 wound culture back + group B strep. 1 of 4 blood cultures + GPC. ABX changed to rocephin . 6/3 Arrived at  [facility] from [place] w/ new hypotension, worsening renal failure, worsening leukocytosis, poorly controlled pain and new encephalopathy. Admitted to ICU. Started on zyvox and meropenem. Surgical consult requested.   Interim History /  Subjective:  Admitted to [facility]  Objective   Blood pressure (Abnormal) 81/65, pulse 87, temperature 98.3 F (36.8 C), temperature source Oral, resp. rate (Abnormal) 25, height 5\' 2"  (1.575 m), weight 104.3 kg, SpO2 95 %.       No intake or output data in the 24 hours ending 12/28/20 1558 Filed Weights   12/28/20 1515  Weight: 104.3 kg    Examination: General: acutely ill appearing white female. She is confused. Restless has intolerable pain involving the left buttocks HENT: MM dry. Neck veins flat.  Lungs: clear. No accessory use  Cardiovascular: RRR soft systolic HM Abdomen: soft not tender.  Extremities: warm the left erythematous fluctuant wound w/ ulcerations just above the gluteal fold that are not stageable. The redness extends down the left leg to medial thigh.  Neuro: awake  GU: foley cath w/ decreased UOP  Labs/imaging that I havepersonally reviewed  (right click and "Reselect all SmartList Selections" daily)  See below     Resolved Hospital Problem list    Assessment & Plan:   Septic shock w/ multiple organ failure 2/2 strep B soft tissue infection (POA) involving the left gluteal fold and extending onto left thigh.  -had [place] gas on imaging from [place] -rising WBCs, uncontrolled pain, worsening organ failure suggests incomplete source control.  Plan Cont MIVFs Central access.  CVP goal 8-12 norepi for MAP > 65 Change abx coverage to zyvox and meropenem.  Serial lactates Surgery consulted will go to OR.   AKI (POA)  Due to sepsis but also had IV dye load  and vanc -renal US done at [place] and neg Plan  Keep euvolemic Renal adjust meds Strict I&O Serial chems  Anion gap metabolic acidosis (POA). Favor worsening lactic acidosis from sepsis Plan Ck lactate Supportive care  Acute metabolic encephalopathy (POA) from sepsis and acidosis. H/o anxiety  Plan Supportive care Treat pain   Diabetes w/ hyperglycemia Plan ssi   H/o  hypertension Plan Holding home meds   Best practice (right click and "Reselect all SmartList Selections" daily)  Diet:  NPO Pain/Anxiety/Delirium protocol (if indicated): No VAP protocol (if indicated): Not indicated DVT prophylaxis: Subcutaneous Heparin GI prophylaxis: N/A Glucose control:  SSI Yes Central venous access:  Yes, and it is still needed Arterial line:  N/A Foley:  Yes, and it is still needed Mobility:  bed rest  PT consulted: Yes Last date of multidisciplinary goals of care discussion [pending] Code Status:  full code Disposition: ICU surgical consult pending   Labs   CBC: No results for input(s): WBC, NEUTROABS, HGB, HCT, MCV, PLT in the last 168 hours.  Basic Metabolic Panel: No results for input(s): NA, K, CL, CO2, GLUCOSE, BUN, CREATININE, CALCIUM, MG, PHOS in the last 168 hours. GFR: CrCl cannot be calculated (No successful lab value found.). No results for input(s): PROCALCITON, WBC, LATICACIDVEN in the last 168 hours.  Liver Function Tests: No results for input(s): AST, ALT, ALKPHOS, BILITOT, PROT, ALBUMIN in the last 168 hours. No results for input(s): LIPASE, AMYLASE in the last 168 hours. No results for input(s): AMMONIA in the last 168 hours.  ABG No results found for: PHART, PCO2ART, PO2ART, HCO3, TCO2, ACIDBASEDEF, O2SAT   Coagulation Profile: No results for input(s): INR, PROTIME in the last 168 hours.  Cardiac Enzymes: No results for input(s): CKTOTAL, CKMB, CKMBINDEX, TROPONINI in the last 168 hours.  HbA1C: No results found for: HGBA1C  CBG: No results for input(s): GLUCAP in the last 168 hours.  Review of Systems:   Not able   Past Medical History:  HTN depression, anxiety diabetes  Surgical History:  Prior cholecystectomy   Social History:      Family History:  Her family history is not on file.   Allergies Not on File   Home Medications  Prior to Admission medications   Not on File     Critical care time: 45  min     Simonne Martinet ACNP-BC [facility] Pulmonary/Critical Care Pager # [phone] OR # [phone] if no answer

## 2020-12-28 NOTE — Progress Notes (Signed)
eLink Physician-Brief Progress Note Patient Name: [name] [name] DOB: [birth year] MRN: [id number]   Date of Service  12/28/2020  HPI/Events of Note  Request to review abdominal film for gastric tube placement reveals advancement of the NG tube into the distal stomach or proximal duodenum.  eICU Interventions  OK to use gastric tube.      Intervention Category Major Interventions: Other:  Lenell Antu 12/28/2020, 11:23 PM

## 2020-12-28 NOTE — Anesthesia Preprocedure Evaluation (Addendum)
Anesthesia Evaluation  Patient identified by MRN, date of birth, ID band Patient awake    Reviewed: Allergy & Precautions, Patient's Chart, lab work & pertinent test results  Airway Mallampati: II  TM Distance: >3 FB Neck ROM: Full    Dental  (+) Edentulous Upper, Edentulous Lower   Pulmonary Current Smoker,    Pulmonary exam normal        Cardiovascular  Rhythm:Regular Rate:Normal     Neuro/Psych    GI/Hepatic negative GI ROS, Neg liver ROS,   Endo/Other  diabetes  Renal/GU negative Renal ROS  Female GU complaint Infected bartholin gland cyst    Musculoskeletal   Abdominal (+)  Abdomen: soft. Bowel sounds: normal.  Peds  Hematology  (+) Blood dyscrasia, ,   Anesthesia Other Findings   Reproductive/Obstetrics                          Anesthesia Physical Anesthesia Plan  ASA: III and emergent  Anesthesia Plan: General   Post-op Pain Management:    Induction: Intravenous  PONV Risk Score and Plan: 4 or greater and Ondansetron, Dexamethasone, Midazolam and Scopolamine patch - Pre-op  Airway Management Planned: Oral ETT  Additional Equipment: Arterial line  Intra-op Plan:   Post-operative Plan: Extubation in OR  Informed Consent: I have reviewed the patients History and Physical, chart, labs and discussed the procedure including the risks, benefits and alternatives for the proposed anesthesia with the patient or authorized representative who has indicated his/her understanding and acceptance.     Dental advisory given  Plan Discussed with: CRNA  Anesthesia Plan Comments: (Possible arterial line Lab Results      Component                Value               Date                      WBC                      52.3 (HH)           12/28/2020                HGB                      11.4 (L)            12/28/2020                HCT                      32.7 (L)             12/28/2020                MCV                      90.6                12/28/2020                PLT                      195                 12/28/2020  Lab Results      Component                Value               Date                      NA                       131 (L)             12/28/2020                K                        4.0                 12/28/2020                CO2                      12 (L)              12/28/2020                GLUCOSE                  133 (H)             12/28/2020                BUN                      45 (H)              12/28/2020                CREATININE               5.97 (H)            12/28/2020                CALCIUM                  8.7 (L)             12/28/2020                GFRNONAA                 8 (L)               12/28/2020          )      Anesthesia Quick Evaluation

## 2020-12-28 NOTE — Progress Notes (Signed)
Labs resulted.  Mg+ 1.7, bicarb 12. INR 1.3, platelets 195.  Starting bicarb infusion at 150cc/hr after 1 amp bolus. If there will be a several hour delay going to OR, go ahead and start CRRT now. Planning for OR tonight for debridement when they are able.  Repeat labs at midnight-- CBC, CMP, INR, lactate. D/w nephrology.  Steffanie Dunn, DO 12/28/20 6:35 PM [facility]

## 2020-12-28 NOTE — Procedures (Signed)
Central Venous Catheter Insertion Procedure Note  [name] [name]  [id number]  [birth year]  Date:12/28/20  Time:5:10 PM   Provider Performing:Pete Bea Laura Tanja Port   Procedure: Insertion of Non-tunneled Central Venous Catheter(36556)with US guidance (20947)    Indication(s) Difficult access and Hemodialysis  Consent Risks of the procedure as well as the alternatives and risks of each were explained to the patient and/or caregiver.  Consent for the procedure was obtained and is signed in the bedside chart  Anesthesia Topical only with 1% lidocaine   Timeout Verified patient identification, verified procedure, site/side was marked, verified correct patient position, special equipment/implants available, medications/allergies/relevant history reviewed, required imaging and test results available.  Sterile Technique Maximal sterile technique including full sterile barrier drape, hand hygiene, sterile gown, sterile gloves, mask, hair covering, sterile ultrasound probe cover (if used).  Procedure Description Area of catheter insertion was cleaned with chlorhexidine and draped in sterile fashion.   With real-time ultrasound guidance a HD catheter was placed into the right internal jugular vein.  Nonpulsatile blood flow and easy flushing noted in all ports.  The catheter was sutured in place and sterile dressing applied.  Complications/Tolerance None; patient tolerated the procedure well. Chest X-ray is ordered to verify placement for internal jugular or subclavian cannulation.  Chest x-ray is not ordered for femoral cannulation.  EBL Minimal  Specimen(s) None Simonne Martinet ACNP-BC [facility] Pulmonary/Critical Care Pager # [phone] OR # [phone] if no answer

## 2020-12-28 NOTE — Transfer of Care (Signed)
Immediate Anesthesia Transfer of Care Note  Patient: [name] [name]  Procedure(s) Performed: IRRIGATION AND DEBRIDEMENT BUTTOCKS (N/A )  Patient Location: ICU  Anesthesia Type:General  Level of Consciousness: Patient remains intubated per anesthesia plan  Airway & Oxygen Therapy: Patient remains intubated per anesthesia plan and Patient placed on Ventilator (see vital sign flow sheet for setting)  Post-op Assessment: Report given to RN and Post -op Vital signs reviewed and stable  Post vital signs: Reviewed and stable  Last Vitals:  Vitals Value Taken Time  BP    Temp    Pulse 98 12/28/20 2128  Resp 16 12/28/20 2128  SpO2 97 % 12/28/20 2128  Vitals shown include unvalidated device data.  Last Pain:  Vitals:   12/28/20 1515  TempSrc: Oral  PainSc: 10-Worst pain ever         Complications: No complications documented.

## 2020-12-28 NOTE — Progress Notes (Signed)
eLink Physician-Brief Progress Note Patient Name: [name] [name] DOB: 1970-10-07 MRN: [id number]   Date of Service  12/28/2020  HPI/Events of Note  Patient returns from OR intubated and ventilated. Nursing request for sedation orders and for review of abdominal film for gastric tube placement. Review of repeat CXR reveals the ETT 4 cm above the carina.   eICU Interventions  Plan: 1. Fentanyl IV infusion. Titrate to RASS = 0 to -1. 2. Advance gastric tube 5-6 cm and repeat abdominal film.  3. Ventilator orders: 60%/PRVC 16/TV 400/P 5. 4. ABG at 11 PM.      Intervention Category Major Interventions: Other:;Delirium, psychosis, severe agitation - evaluation and management  [name] 12/28/2020, 10:18 PM

## 2020-12-28 NOTE — Progress Notes (Addendum)
Pharmacy Antibiotic Note  [name] [name] is a 50 y.o. female admitted on 12/28/2020 with sepsis w/ multiple organ failure 2/2 strep B soft tissue infection of left gluteal fold and left thigh. Pharmacy has been consulted for meropenem dosing.  Patient transferred from [facility] following complex admission (admit date 5/29) secondary to pain and ulceration involving fluid filled mass on left buttocks. CT from [place] showing gas but w/o abscess. I&D performed there and cultures collected. Patient with AKI and requiring hemodynamic support with norepinephrine.  SCr at [facility] was 5.1. CRRT orders placed.  [place] culture data: 1/4 GPC in anaerobic bottle blood. 5/31 culture from wound growing group B strep.  Plan: Meropenem 1000 mg every 8 hours Follow up with cultures  Height: 5\' 2"  (157.5 cm) Weight: 104.3 kg (229 lb 15 oz) IBW/kg (Calculated) : 50.1  Temp (24hrs), Avg:98.3 F (36.8 C), Min:98.3 F (36.8 C), Max:98.3 F (36.8 C)  No results for input(s): WBC, CREATININE, LATICACIDVEN, VANCOTROUGH, VANCOPEAK, VANCORANDOM, GENTTROUGH, GENTPEAK, GENTRANDOM, TOBRATROUGH, TOBRAPEAK, TOBRARND, AMIKACINPEAK, AMIKACINTROU, AMIKACIN in the last 168 hours.  CrCl cannot be calculated (No successful lab value found.).    Not on File  Antimicrobials this admission: On vancomycin at [place] and transitioned to rocephin. Linezolid 6/3 >>  Meropenem 6/3 >>  Thank you for allowing pharmacy to be a part of this patient's care.  8/3, PharmD, BCPS Emergency Medicine Clinical Pharmacist 12/28/2020 4:39 PM

## 2020-12-28 NOTE — Progress Notes (Signed)
eLink Physician-Brief Progress Note Patient Name: [name] [name] DOB: 06-17-1971 MRN: [id number]   Date of Service  12/28/2020  HPI/Events of Note  ABG on 60%/PRVC 16/TV 400/P 5 = 7.030/63.1/169/16.8  eICU Interventions  Plan: 1. NaHCO3 200 meq IV now. 2. Increase PRVC rate to 30. 3. Repeat ABG at 2 AM. 4. Wean Norepinephrine IV infusion as tolerated.      Intervention Category Major Interventions: Acid-Base disturbance - evaluation and management;Respiratory failure - evaluation and management  Lenell Antu 12/28/2020, 11:33 PM

## 2020-12-28 NOTE — Progress Notes (Signed)
Patient left for OR. Consents in chart, sent with patient.

## 2020-12-28 NOTE — Anesthesia Procedure Notes (Signed)
Procedure Name: Intubation Date/Time: 12/28/2020 7:50 PM Performed by: Anes Rigel T, CRNA Pre-anesthesia Checklist: Patient identified, Emergency Drugs available, Suction available and Patient being monitored Patient Re-evaluated:Patient Re-evaluated prior to induction Oxygen Delivery Method: Circle system utilized Preoxygenation: Pre-oxygenation with 100% oxygen Induction Type: IV induction Ventilation: Mask ventilation without difficulty Laryngoscope Size: Mac and 4 Grade View: Grade I Tube type: Oral Tube size: 7.5 mm Number of attempts: 1 Airway Equipment and Method: Stylet and Oral airway Placement Confirmation: ETT inserted through vocal cords under direct vision,  positive ETCO2 and breath sounds checked- equal and bilateral Secured at: 22 cm Tube secured with: Tape Dental Injury: Teeth and Oropharynx as per pre-operative assessment

## 2020-12-29 ENCOUNTER — Other Ambulatory Visit: Payer: Self-pay

## 2020-12-29 ENCOUNTER — Encounter (HOSPITAL_COMMUNITY): Payer: Self-pay | Admitting: Critical Care Medicine

## 2020-12-29 DIAGNOSIS — E1165 Type 2 diabetes mellitus with hyperglycemia: Secondary | ICD-10-CM

## 2020-12-29 DIAGNOSIS — J9602 Acute respiratory failure with hypercapnia: Secondary | ICD-10-CM | POA: Diagnosis not present

## 2020-12-29 DIAGNOSIS — A401 Sepsis due to streptococcus, group B: Secondary | ICD-10-CM

## 2020-12-29 DIAGNOSIS — M7989 Other specified soft tissue disorders: Secondary | ICD-10-CM

## 2020-12-29 DIAGNOSIS — A419 Sepsis, unspecified organism: Secondary | ICD-10-CM

## 2020-12-29 DIAGNOSIS — N179 Acute kidney failure, unspecified: Secondary | ICD-10-CM

## 2020-12-29 DIAGNOSIS — E669 Obesity, unspecified: Secondary | ICD-10-CM | POA: Diagnosis not present

## 2020-12-29 DIAGNOSIS — R6521 Severe sepsis with septic shock: Secondary | ICD-10-CM

## 2020-12-29 LAB — COMPREHENSIVE METABOLIC PANEL
ALT: 20 U/L (ref 0–44)
ALT: 19 U/L (ref 0–44)
AST: 40 U/L (ref 15–41)
AST: 35 U/L (ref 15–41)
Albumin: 1.6 g/dL — ABNORMAL LOW (ref 3.5–5.0)
Albumin: 1.4 g/dL — ABNORMAL LOW (ref 3.5–5.0)
Alkaline Phosphatase: 449 U/L — ABNORMAL HIGH (ref 38–126)
Alkaline Phosphatase: 353 U/L — ABNORMAL HIGH (ref 38–126)
Anion gap: 14 (ref 5–15)
Anion gap: 19 — ABNORMAL HIGH (ref 5–15)
BUN: 43 mg/dL — ABNORMAL HIGH (ref 6–20)
BUN: 35 mg/dL — ABNORMAL HIGH (ref 6–20)
CO2: 16 mmol/L — ABNORMAL LOW (ref 22–32)
CO2: 16 mmol/L — ABNORMAL LOW (ref 22–32)
Calcium: 8.2 mg/dL — ABNORMAL LOW (ref 8.9–10.3)
Calcium: 7.9 mg/dL — ABNORMAL LOW (ref 8.9–10.3)
Chloride: 102 mmol/L (ref 98–111)
Chloride: 100 mmol/L (ref 98–111)
Creatinine, Ser: 5.55 mg/dL — ABNORMAL HIGH (ref 0.44–1.00)
Creatinine, Ser: 4.66 mg/dL — ABNORMAL HIGH (ref 0.44–1.00)
GFR, Estimated: 9 mL/min — ABNORMAL LOW (ref >60)
GFR, Estimated: 11 mL/min — ABNORMAL LOW (ref >60)
Glucose, Bld: 200 mg/dL — ABNORMAL HIGH (ref 70–99)
Glucose, Bld: 185 mg/dL — ABNORMAL HIGH (ref 70–99)
Potassium: 3.8 mmol/L (ref 3.5–5.1)
Potassium: 3.6 mmol/L (ref 3.5–5.1)
Sodium: 132 mmol/L — ABNORMAL LOW (ref 135–145)
Sodium: 135 mmol/L (ref 135–145)
Total Bilirubin: 6 mg/dL — ABNORMAL HIGH (ref 0.3–1.2)
Total Bilirubin: 6 mg/dL — ABNORMAL HIGH (ref 0.3–1.2)
Total Protein: 6.2 g/dL — ABNORMAL LOW (ref 6.5–8.1)
Total Protein: 5.4 g/dL — ABNORMAL LOW (ref 6.5–8.1)

## 2020-12-29 LAB — POCT I-STAT 7, (LYTES, BLD GAS, ICA,H+H)
Acid-base deficit: 14 mmol/L — ABNORMAL HIGH (ref 0.0–2.0)
Acid-base deficit: 6 mmol/L — ABNORMAL HIGH (ref 0.0–2.0)
Bicarbonate: 15.4 mmol/L — ABNORMAL LOW (ref 20.0–28.0)
Bicarbonate: 19.4 mmol/L — ABNORMAL LOW (ref 20.0–28.0)
Calcium, Ion: 1.16 mmol/L (ref 1.15–1.40)
Calcium, Ion: 0.99 mmol/L — ABNORMAL LOW (ref 1.15–1.40)
HCT: 36 % (ref 36.0–46.0)
HCT: 32 % — ABNORMAL LOW (ref 36.0–46.0)
Hemoglobin: 12.2 g/dL (ref 12.0–15.0)
Hemoglobin: 10.9 g/dL — ABNORMAL LOW (ref 12.0–15.0)
O2 Saturation: 97 %
O2 Saturation: 100 %
Patient temperature: 36.7
Patient temperature: 98.6
Potassium: 3.8 mmol/L (ref 3.5–5.1)
Potassium: 3.5 mmol/L (ref 3.5–5.1)
Sodium: 133 mmol/L — ABNORMAL LOW (ref 135–145)
Sodium: 135 mmol/L (ref 135–145)
TCO2: 17 mmol/L — ABNORMAL LOW (ref 22–32)
TCO2: 20 mmol/L — ABNORMAL LOW (ref 22–32)
pCO2 arterial: 49.4 mmHg — ABNORMAL HIGH (ref 32.0–48.0)
pCO2 arterial: 36.4 mmHg (ref 32.0–48.0)
pH, Arterial: 7.1 — CL (ref 7.350–7.450)
pH, Arterial: 7.333 — ABNORMAL LOW (ref 7.350–7.450)
pO2, Arterial: 119 mmHg — ABNORMAL HIGH (ref 83.0–108.0)
pO2, Arterial: 209 mmHg — ABNORMAL HIGH (ref 83.0–108.0)

## 2020-12-29 LAB — CBC
HCT: 34.5 % — ABNORMAL LOW (ref 36.0–46.0)
HCT: 31.1 % — ABNORMAL LOW (ref 36.0–46.0)
Hemoglobin: 11.6 g/dL — ABNORMAL LOW (ref 12.0–15.0)
Hemoglobin: 11.1 g/dL — ABNORMAL LOW (ref 12.0–15.0)
MCH: 31.7 pg (ref 26.0–34.0)
MCH: 32.3 pg (ref 26.0–34.0)
MCHC: 33.6 g/dL (ref 30.0–36.0)
MCHC: 35.7 g/dL (ref 30.0–36.0)
MCV: 94.3 fL (ref 80.0–100.0)
MCV: 90.4 fL (ref 80.0–100.0)
Platelets: 253 10*3/uL (ref 150–400)
Platelets: 156 10*3/uL (ref 150–400)
RBC: 3.66 MIL/uL — ABNORMAL LOW (ref 3.87–5.11)
RBC: 3.44 MIL/uL — ABNORMAL LOW (ref 3.87–5.11)
RDW: 14.1 % (ref 11.5–15.5)
RDW: 13.5 % (ref 11.5–15.5)
WBC: 77.5 10*3/uL (ref 4.0–10.5)
WBC: 60.9 10*3/uL (ref 4.0–10.5)
nRBC: 0.2 % (ref 0.0–0.2)
nRBC: 0.1 % (ref 0.0–0.2)

## 2020-12-29 LAB — BASIC METABOLIC PANEL
Anion gap: 14 (ref 5–15)
BUN: 31 mg/dL — ABNORMAL HIGH (ref 6–20)
CO2: 21 mmol/L — ABNORMAL LOW (ref 22–32)
Calcium: 7.9 mg/dL — ABNORMAL LOW (ref 8.9–10.3)
Chloride: 99 mmol/L (ref 98–111)
Creatinine, Ser: 3.73 mg/dL — ABNORMAL HIGH (ref 0.44–1.00)
GFR, Estimated: 14 mL/min — ABNORMAL LOW (ref >60)
Glucose, Bld: 196 mg/dL — ABNORMAL HIGH (ref 70–99)
Potassium: 3.7 mmol/L (ref 3.5–5.1)
Sodium: 134 mmol/L — ABNORMAL LOW (ref 135–145)

## 2020-12-29 LAB — RENAL FUNCTION PANEL
Albumin: 1.2 g/dL — ABNORMAL LOW (ref 3.5–5.0)
Anion gap: 12 (ref 5–15)
BUN: 26 mg/dL — ABNORMAL HIGH (ref 6–20)
CO2: 24 mmol/L (ref 22–32)
Calcium: 7.5 mg/dL — ABNORMAL LOW (ref 8.9–10.3)
Chloride: 96 mmol/L — ABNORMAL LOW (ref 98–111)
Creatinine, Ser: 3.24 mg/dL — ABNORMAL HIGH (ref 0.44–1.00)
GFR, Estimated: 17 mL/min — ABNORMAL LOW (ref >60)
Glucose, Bld: 173 mg/dL — ABNORMAL HIGH (ref 70–99)
Phosphorus: 3.1 mg/dL (ref 2.5–4.6)
Potassium: 3.3 mmol/L — ABNORMAL LOW (ref 3.5–5.1)
Sodium: 132 mmol/L — ABNORMAL LOW (ref 135–145)

## 2020-12-29 LAB — GLUCOSE, CAPILLARY
Glucose-Capillary: 187 mg/dL — ABNORMAL HIGH (ref 70–99)
Glucose-Capillary: 173 mg/dL — ABNORMAL HIGH (ref 70–99)
Glucose-Capillary: 193 mg/dL — ABNORMAL HIGH (ref 70–99)
Glucose-Capillary: 167 mg/dL — ABNORMAL HIGH (ref 70–99)
Glucose-Capillary: 178 mg/dL — ABNORMAL HIGH (ref 70–99)
Glucose-Capillary: 197 mg/dL — ABNORMAL HIGH (ref 70–99)

## 2020-12-29 LAB — PROTIME-INR
INR: 1.5 — ABNORMAL HIGH (ref 0.8–1.2)
Prothrombin Time: 17.9 seconds — ABNORMAL HIGH (ref 11.4–15.2)

## 2020-12-29 LAB — MAGNESIUM
Magnesium: 1.9 mg/dL (ref 1.7–2.4)
Magnesium: 2 mg/dL (ref 1.7–2.4)
Magnesium: 1.9 mg/dL (ref 1.7–2.4)

## 2020-12-29 LAB — PHOSPHORUS
Phosphorus: 3.7 mg/dL (ref 2.5–4.6)
Phosphorus: 3 mg/dL (ref 2.5–4.6)

## 2020-12-29 LAB — LACTIC ACID, PLASMA: Lactic Acid, Venous: 5.1 mmol/L (ref 0.5–1.9)

## 2020-12-29 MED ORDER — MAGNESIUM SULFATE 2 GM/50ML IV SOLN
2.0000 g | Freq: Once | INTRAVENOUS | Status: AC
  Administered 2020-12-29: 2 g via INTRAVENOUS
  Filled 2020-12-29: qty 50

## 2020-12-29 MED ORDER — IMMUNE GLOBULIN (HUMAN) 10 GM/100ML IV SOLN
0.5000 g/kg | INTRAVENOUS | Status: AC
  Administered 2020-12-30 – 2020-12-31 (×2): 50 g via INTRAVENOUS
  Filled 2020-12-29 (×2): qty 400

## 2020-12-29 MED ORDER — INSULIN ASPART 100 UNIT/ML IJ SOLN: 5.0000 [IU] | INTRAMUSCULAR | Status: DC

## 2020-12-29 MED ORDER — INSULIN ASPART 100 UNIT/ML IJ SOLN
5.0000 [IU] | INTRAMUSCULAR | Status: DC
  Administered 2020-12-29 – 2020-12-30 (×6): 5 [IU] via SUBCUTANEOUS

## 2020-12-29 MED ORDER — FAMOTIDINE IN NACL 20-0.9 MG/50ML-% IV SOLN
20.0000 mg | INTRAVENOUS | Status: DC
  Administered 2020-12-29: 20 mg via INTRAVENOUS
  Filled 2020-12-29 (×2): qty 50

## 2020-12-29 MED ORDER — AMIODARONE HCL IN DEXTROSE 360-4.14 MG/200ML-% IV SOLN
60.0000 mg/h | INTRAVENOUS | Status: AC
  Administered 2020-12-29 (×2): 60 mg/h via INTRAVENOUS
  Filled 2020-12-29 (×2): qty 200

## 2020-12-29 MED ORDER — SODIUM CHLORIDE 0.9 % IV BOLUS
500.0000 mL | Freq: Once | INTRAVENOUS | Status: AC
  Administered 2020-12-29: 500 mL via INTRAVENOUS

## 2020-12-29 MED ORDER — CALCIUM GLUCONATE-NACL 2-0.675 GM/100ML-% IV SOLN
2.0000 g | Freq: Once | INTRAVENOUS | Status: AC
  Administered 2020-12-29: 2000 mg via INTRAVENOUS
  Filled 2020-12-29: qty 100

## 2020-12-29 MED ORDER — MIDAZOLAM HCL 2 MG/2ML IJ SOLN
2.0000 mg | INTRAMUSCULAR | Status: DC | PRN
  Administered 2020-12-29 (×2): 2 mg via INTRAVENOUS
  Administered 2020-12-29: 1 mg via INTRAVENOUS
  Administered 2020-12-29 (×2): 2 mg via INTRAVENOUS
  Administered 2020-12-29: 1 mg via INTRAVENOUS
  Administered 2020-12-30 – 2021-01-02 (×12): 2 mg via INTRAVENOUS
  Filled 2020-12-29 (×18): qty 2

## 2020-12-29 MED ORDER — POLYETHYLENE GLYCOL 3350 17 G PO PACK
17.0000 g | PACK | Freq: Every day | ORAL | Status: DC | PRN
  Administered 2021-01-05: 17 g
  Filled 2020-12-29: qty 1

## 2020-12-29 MED ORDER — AMIODARONE HCL IN DEXTROSE 360-4.14 MG/200ML-% IV SOLN
30.0000 mg/h | INTRAVENOUS | Status: DC
  Administered 2020-12-30: 30 mg/h via INTRAVENOUS
  Filled 2020-12-29: qty 200

## 2020-12-29 MED ORDER — AMIODARONE LOAD VIA INFUSION
150.0000 mg | Freq: Once | INTRAVENOUS | Status: AC
  Administered 2020-12-29: 150 mg via INTRAVENOUS
  Filled 2020-12-29: qty 83.34

## 2020-12-29 MED ORDER — FENTANYL BOLUS VIA INFUSION
50.0000 ug | INTRAVENOUS | Status: DC | PRN
  Administered 2020-12-29 (×3): 100 ug via INTRAVENOUS
  Administered 2020-12-29: 50 ug via INTRAVENOUS
  Administered 2020-12-30 (×2): 100 ug via INTRAVENOUS
  Administered 2020-12-30: 50 ug via INTRAVENOUS
  Administered 2020-12-30 (×2): 100 ug via INTRAVENOUS
  Administered 2020-12-30: 50 ug via INTRAVENOUS
  Administered 2020-12-30: 100 ug via INTRAVENOUS
  Administered 2020-12-30 – 2020-12-31 (×2): 50 ug via INTRAVENOUS
  Administered 2020-12-31 (×2): 100 ug via INTRAVENOUS
  Administered 2020-12-31: 50 ug via INTRAVENOUS
  Administered 2021-01-01 (×4): 100 ug via INTRAVENOUS
  Administered 2021-01-01: 50 ug via INTRAVENOUS
  Administered 2021-01-02 (×4): 100 ug via INTRAVENOUS
  Administered 2021-01-02: 50 ug via INTRAVENOUS
  Administered 2021-01-03 – 2021-01-04 (×3): 100 ug via INTRAVENOUS
  Administered 2021-01-04: 50 ug via INTRAVENOUS
  Filled 2020-12-29: qty 100

## 2020-12-29 MED ORDER — VITAL HIGH PROTEIN PO LIQD
1000.0000 mL | ORAL | Status: DC
  Administered 2020-12-29: 1000 mL

## 2020-12-29 MED ORDER — FENTANYL 2500MCG IN NS 250ML (10MCG/ML) PREMIX INFUSION
50.0000 ug/h | INTRAVENOUS | Status: DC
  Administered 2020-12-29 – 2020-12-31 (×8): 400 ug/h via INTRAVENOUS
  Administered 2020-12-31: 350 ug/h via INTRAVENOUS
  Administered 2020-12-31 (×2): 400 ug/h via INTRAVENOUS
  Administered 2021-01-01: 350 ug/h via INTRAVENOUS
  Administered 2021-01-01: 250 ug/h via INTRAVENOUS
  Administered 2021-01-01: 300 ug/h via INTRAVENOUS
  Administered 2021-01-02: 400 ug/h via INTRAVENOUS
  Administered 2021-01-02: 375 ug/h via INTRAVENOUS
  Administered 2021-01-02 – 2021-01-03 (×4): 400 ug/h via INTRAVENOUS
  Administered 2021-01-03 – 2021-01-04 (×3): 300 ug/h via INTRAVENOUS
  Administered 2021-01-05: 200 ug/h via INTRAVENOUS
  Filled 2020-12-29 (×24): qty 250

## 2020-12-29 MED ORDER — STERILE WATER FOR INJECTION IV SOLN
INTRAVENOUS | Status: DC
  Filled 2020-12-29 (×16): qty 150

## 2020-12-29 MED ORDER — CLINDAMYCIN PHOSPHATE 900 MG/50ML IV SOLN
900.0000 mg | Freq: Three times a day (TID) | INTRAVENOUS | Status: DC
  Filled 2020-12-29 (×2): qty 50

## 2020-12-29 MED ORDER — MIDAZOLAM HCL 2 MG/2ML IJ SOLN
INTRAMUSCULAR | Status: AC
  Filled 2020-12-29: qty 2

## 2020-12-29 MED ORDER — IMMUNE GLOBULIN (HUMAN) 10 GM/100ML IV SOLN
1.0000 g/kg | Freq: Once | INTRAVENOUS | Status: AC
  Administered 2020-12-29: 105 g via INTRAVENOUS
  Filled 2020-12-29: qty 1000

## 2020-12-29 MED ORDER — POTASSIUM CHLORIDE 10 MEQ/50ML IV SOLN
10.0000 meq | INTRAVENOUS | Status: AC
  Administered 2020-12-29 – 2020-12-30 (×3): 10 meq via INTRAVENOUS
  Filled 2020-12-29 (×3): qty 50

## 2020-12-29 MED ORDER — DOCUSATE SODIUM 50 MG/5ML PO LIQD
100.0000 mg | Freq: Two times a day (BID) | ORAL | Status: DC
  Administered 2020-12-29 – 2021-01-06 (×12): 100 mg
  Filled 2020-12-29 (×14): qty 10

## 2020-12-29 MED ORDER — SODIUM CHLORIDE 0.9% FLUSH
10.0000 mL | Freq: Two times a day (BID) | INTRAVENOUS | Status: DC
  Administered 2020-12-29 – 2021-01-11 (×24): 10 mL
  Administered 2021-01-12: 20 mL
  Administered 2021-01-12 – 2021-01-19 (×6): 10 mL

## 2020-12-29 MED ORDER — NOREPINEPHRINE 16 MG/250ML-% IV SOLN
0.0000 ug/min | INTRAVENOUS | Status: DC
  Administered 2020-12-29: 10 ug/min via INTRAVENOUS
  Administered 2020-12-29: 30 ug/min via INTRAVENOUS
  Administered 2020-12-30: 9 ug/min via INTRAVENOUS
  Administered 2021-01-02: 4 ug/min via INTRAVENOUS
  Administered 2021-01-04: 10 ug/min via INTRAVENOUS
  Filled 2020-12-29 (×5): qty 250

## 2020-12-29 MED ORDER — POLYETHYLENE GLYCOL 3350 17 G PO PACK
17.0000 g | PACK | Freq: Every day | ORAL | Status: DC
  Administered 2020-12-29 – 2021-01-06 (×6): 17 g
  Filled 2020-12-29 (×7): qty 1

## 2020-12-29 MED ORDER — FENTANYL CITRATE (PF) 100 MCG/2ML IJ SOLN: 50.0000 ug | Freq: Once | INTRAMUSCULAR | Status: DC

## 2020-12-29 MED ORDER — SODIUM CHLORIDE 0.9% FLUSH
10.0000 mL | INTRAVENOUS | Status: DC | PRN
  Administered 2021-01-19: 10 mL

## 2020-12-29 MED ORDER — PROSOURCE TF PO LIQD
45.0000 mL | Freq: Two times a day (BID) | ORAL | Status: DC
  Administered 2020-12-29 (×2): 45 mL
  Filled 2020-12-29 (×2): qty 45

## 2020-12-29 NOTE — Progress Notes (Signed)
Elink notified of patient reading Afib on cardiac monitor. EKG obtained. Awaiting new orders

## 2020-12-29 NOTE — Progress Notes (Signed)
eLink Physician-Brief Progress Note Patient Name: [name] [name] DOB: 04-Nov-1970 MRN: [id number]   Date of Service  12/29/2020  HPI/Events of Note  Lactic Acid = 3.3 --> 3.4 --> 5.1. No CVL or CVP. Hgb = 11.6. Creatinine = 5.55 and K+ = 3.8 on CRRT. Agitation/  eICU Interventions  Plan: 1. Bolus with 0.9 NaCl 500 mL IV over 30 minutes now.  2. Increase ceiling on Fentanyl IV infusion to 400 mcg/hour.      Intervention Category Major Interventions: Acid-Base disturbance - evaluation and management  Jodee Wagenaar Eugene 12/29/2020, 1:27 AM

## 2020-12-29 NOTE — Progress Notes (Signed)
eLink Physician-Brief Progress Note Patient Name: [name] [name] DOB: 11-Aug-1970 MRN: [id number]   Date of Service  12/29/2020  HPI/Events of Note  Hypothermia - Temp = 94.4 F.  eICU Interventions  Will order Bair Hugger PRN.      Intervention Category Major Interventions: Other:  Tiersa Dayley Dennard Nip 12/29/2020, 2:40 AM

## 2020-12-29 NOTE — Progress Notes (Signed)
Patient ID: [name] [name], female   DOB: 06-Apr-1971, 50 y.o.   MRN: [id number] [facility] Progress Note   Assessment/ Plan:   1.  Acute kidney injury: Likely ATN associated with sepsis with a background of contrast exposure and vancomycin use as well (levels unavailable).  She is anuric and was started on CRRT yesterday for management of multiple metabolic abnormalities. 2.  Anion gap metabolic acidosis: Secondary to acute kidney injury/lactic acidosis and associated sepsis.  She was on bicarbonate drip overnight without significant improvement of metabolic acidosis and I will switch CRRT prescription. 3.  Hyponatremia: Secondary to acute kidney injury and impaired free water handling.  Monitor with CRRT. 4.  Left gluteal cellulitis/possible abscess:   Currently on linezolid and meropenem for broad-spectrum antimicrobial coverage and yesterday had debridement of left buttock necrotizing soft tissue infection.  Leukocytosis persists but appears to be trending down. 5.  Type 2 diabetes mellitus: Management per critical care protocol/service.  Subjective:   Underwent surgical debridement of left buttock soft tissue infection overnight.  With some electrolyte abnormalities and hemodynamic instability/hypothermia overnight.   Objective:   BP 108/61   Pulse (!) 108   Temp (!) 95 F (35 C) (Axillary)   Resp (!) 30   Ht 5' 2"  (1.575 m)   Wt 104.4 kg   SpO2 100%   BMI 42.10 kg/m   Intake/Output Summary (Last 24 hours) at 12/29/2020 2585 Last data filed at 12/29/2020 0700 Gross per 24 hour  Intake 3211.08 ml  Output 2316 ml  Net 895.08 ml   Weight change:   Physical Exam: Gen: Intubated, sedated, appears comfortable on CRRT CVS: Regular tachycardia, S1 and S2 with ejection systolic murmur Resp: Clear to auscultation bilaterally, no rales/rhonchi Abd: Soft, obese, nontender, bowel sounds normal Ext: Trace lower extremity edema  Imaging: DG CHEST PORT 1 VIEW  Result Date:  12/28/2020 CLINICAL DATA:  ET tube adjusted EXAM: PORTABLE CHEST 1 VIEW COMPARISON:  12/28/2020 FINDINGS: Endotracheal tube has been retracted with the tip now approximately 4 cm above the carina. Right Central line and NG tube are unchanged. Increasing bilateral perihilar and right lower lobe airspace disease. Heart is borderline in size. IMPRESSION: Endotracheal tube has been retracted, now 4 cm above the carina. Other support devices stable. Increasing bilateral perihilar and right lower lobe airspace disease. Electronically Signed   By: Rolm Baptise M.D.   On: 12/28/2020 22:11   DG CHEST PORT 1 VIEW  Result Date: 12/28/2020 CLINICAL DATA:  Hypoxia EXAM: PORTABLE CHEST 1 VIEW COMPARISON:  December 28, 2020 study obtained earlier in the day FINDINGS: Endotracheal tube tip is in the proximal right main bronchus. Nasogastric tube tip and side port are in the stomach. Central catheter tip is in the superior vena cava. No pneumothorax. There is mild left base atelectasis. No edema or airspace opacity. There is stable cardiomegaly with pulmonary vascularity normal. No adenopathy. No bone lesions. IMPRESSION: Tube and catheter positions as described without pneumothorax. The endotracheal tube tip is in the proximal right main bronchus. Advise withdrawing endotracheal tube approximately 4 cm. Mild left base atelectasis. No edema or consolidation. Stable cardiomegaly. Critical Value/emergent results were called by telephone at the time of interpretation on 12/28/2020 at 9:49 pm to provider Sandford Craze, RN, who verbally acknowledged these results. Electronically Signed   By: Lowella Grip III M.D.   On: 12/28/2020 21:50   DG CHEST PORT 1 VIEW  Result Date: 12/28/2020 CLINICAL DATA:  Central line placement EXAM: PORTABLE CHEST 1  VIEW COMPARISON:  12/27/2020 FINDINGS: Right dialysis catheter in place with the tip at the cavoatrial junction. No pneumothorax. Cardiomegaly with vascular congestion. Right lower lobe  atelectasis or infiltrate. Possible small layering right effusion. No overt edema. No acute bony abnormality. IMPRESSION: Cardiomegaly, vascular congestion. Right lower lobe atelectasis or infiltrate with suspected small right effusion. Electronically Signed   By: Rolm Baptise M.D.   On: 12/28/2020 17:36   DG Abd Portable 1V  Result Date: 12/28/2020 CLINICAL DATA:  OG tube placement EXAM: PORTABLE ABDOMEN - 1 VIEW COMPARISON:  12/28/2020 FINDINGS: OG tube has been advanced with the tip in the right abdomen. This could be in the distal stomach or proximal duodenum. Prior cholecystectomy. IMPRESSION: Advancement of the NG tube into the distal stomach or proximal duodenum. Electronically Signed   By: Rolm Baptise M.D.   On: 12/28/2020 23:04   DG Abd Portable 1V  Result Date: 12/28/2020 CLINICAL DATA:  OG tube placement EXAM: PORTABLE ABDOMEN - 1 VIEW COMPARISON:  None. FINDINGS: OG tube tip is in the proximal stomach with the side port near the GE junction. Prior cholecystectomy. IMPRESSION: OG tube tip in the proximal stomach. Electronically Signed   By: Rolm Baptise M.D.   On: 12/28/2020 21:45    Labs: BMET Recent Labs  Lab 12/28/20 1641 12/28/20 2236 12/28/20 2326 12/29/20 0217 12/29/20 0253  NA 131* 135 132* 135 135  K 4.0 3.8 3.8 3.5 3.6  CL 100  --  102  --  100  CO2 12*  --  16*  --  16*  GLUCOSE 133*  --  200*  --  185*  BUN 45*  --  43*  --  35*  CREATININE 5.97*  --  5.55*  --  4.66*  CALCIUM 8.7*  --  8.2*  --  7.9*   CBC Recent Labs  Lab 12/28/20 1641 12/28/20 2236 12/28/20 2326 12/29/20 0217 12/29/20 0253  WBC 52.3*  --  77.5*  --  60.9*  HGB 11.4* 12.2 11.6* 10.9* 11.1*  HCT 32.7* 36.0 34.5* 32.0* 31.1*  MCV 90.6  --  94.3  --  90.4  PLT 195  --  253  --  156    Medications:    . chlorhexidine gluconate (MEDLINE KIT)  15 mL Mouth Rinse BID  . Chlorhexidine Gluconate Cloth  6 each Topical Q0600  . heparin  5,000 Units Subcutaneous Q8H  . insulin aspart  0-15  Units Subcutaneous Q4H  . mouth rinse  15 mL Mouth Rinse 10 times per day  . sodium chloride flush  10-40 mL Intracatheter Q12H   Elmarie Shiley, MD 12/29/2020, 7:38 AM

## 2020-12-29 NOTE — Progress Notes (Addendum)
eLink Physician-Brief Progress Note Patient Name: Nattalie Santiesteban DOB: 06/13/1971 MRN: [id number]   Date of Service  12/29/2020  HPI/Events of Note  Multiple issues: 1. ABG on 60%/PRVC 30/TV 400/P 5 = 7.333/36.4/209/20 and 2. Hypocalcemia - Ionized Ca++ = 0.99  eICU Interventions  Plan: 1. Continue present ventilator management. 2. Will replace Ca++.     Intervention Category Major Interventions: Respiratory failure - evaluation and management  Seferino Oscar Eugene 12/29/2020, 3:52 AM/

## 2020-12-29 NOTE — Consult Note (Addendum)
[facility]    Date of Admission:  12/28/2020     Reason for Consult: Necrotizing soft tissue infection     Referring Physician: Dr. Lake Bells  Current antibiotics: Linezolid 6/3--present Meropenem 6/3--present   ASSESSMENT:    Necrotizing skin and soft tissue infection of the left buttock/posterior thigh: Status post debridement 12/28/2020 in the OR.  Cultures currently pending, however, I&D performed at outside hospital with GBS (PCN Sensitive) and 1 out of 4 blood culture bottles with GPC's in anaerobic bottle. Septic shock: secondary to #1 Severe leukocytosis: secondary to #1 Acute kidney injury: Requiring CRRT Ventilatory dependent respiratory failure Type 2 diabetes, obesity  PLAN:    Would favor trial of IVIG in the setting of streptococcal TSS given her critical illness Continue meropenem and linezolid for broad antimicrobial coverage while OR cultures here are pending.  Linezolid also offers antitoxin effect so will not add clindamycin at this time Follow-up cultures Wound care, glycemic control Pressors and ventilatory support per CCM   Principal Problem:   Necrotizing soft tissue infection Active Problems:   Septic shock (HCC)   Sepsis due to Streptococcus, group B (HCC)   Acute kidney injury ([place])   Obesity   Type 2 diabetes mellitus with hyperglycemia (HCC)   MEDICATIONS:    Scheduled Meds: . chlorhexidine gluconate (MEDLINE KIT)  15 mL Mouth Rinse BID  . Chlorhexidine Gluconate Cloth  6 each Topical Q0600  . docusate  100 mg Per Tube BID  . feeding supplement (PROSource TF)  45 mL Per Tube BID  . feeding supplement (VITAL HIGH PROTEIN)  1,000 mL Per Tube Q24H  . fentaNYL (SUBLIMAZE) injection  50 mcg Intravenous Once  . heparin  5,000 Units Subcutaneous Q8H  . insulin aspart  0-15 Units Subcutaneous Q4H  . insulin aspart  5 Units Subcutaneous Q4H  . mouth rinse  15 mL Mouth Rinse 10 times per day  . polyethylene glycol  17  g Per Tube Daily  . sodium chloride flush  10-40 mL Intracatheter Q12H   Continuous Infusions: .  prismasol BGK 4/2.5 400 mL/hr at 12/29/20 1037  . sodium chloride Stopped (12/28/20 1851)  . fentaNYL infusion INTRAVENOUS 400 mcg/hr (12/29/20 1100)  . linezolid (ZYVOX) IV Stopped (12/29/20 1007)  . meropenem (MERREM) IV Stopped (12/29/20 0612)  . norepinephrine (LEVOPHED) Adult infusion 24 mcg/min (12/29/20 1100)  . prismasol BGK 4/2.5 1,500 mL/hr at 12/29/20 1109  . sodium bicarbonate (isotonic) 1000 mL infusion 400 mL/hr at 12/29/20 0944  . vasopressin 0.03 Units/min (12/29/20 1100)   PRN Meds:.fentaNYL, heparin, heparin, midazolam, ondansetron (ZOFRAN) IV, polyethylene glycol, sodium chloride flush  HPI:    [name] [name] is a 50 y.o. female with diabetes, obesity, prior COVID infection 2020 who was admitted at [facility] 12/23/2020 with cellulitis associated with an infected Bartholin gland cyst.  She was recently seen as an outpatient on 12/19/2020 and noted to have a Bartholin gland cyst and vulvar irritation by her gynecologist at that time.  She continued to have progression in this area and subsequently presented to the ED on 5/29.  She underwent incision and drainage at [facility] in the ED upon admission.  Cultures of this wound grew group B strep.  Blood cultures also grew 1 out of 4 GPC's.  She underwent CT scan which demonstrated subcutaneous gas but no abscess and she was transferred to [facility] for further evaluation and escalation of care.  Upon arrival she was hypotensive and started  on norepinephrine.  She had progressive oliguria with increased creatinine.  General surgery was consulted and she was taken to the OR last evening for emergent debridement of her necrotizing soft tissue infection of the left buttocks.  She underwent debridement with total tissue volume measuring 14 cm x 12 cm x 3 cm deep.  Cultures from the OR are currently pending at the moment.   She has been placed on meropenem and linezolid for her infection.  She has been started on CRRT by nephrology in the setting of her profound metabolic acidosis and hemodynamic instability.  She remains intubated post OR, on 2 vasopressors, and sedated with fentanyl.  We have been consulted for further antibiotic recommendations as well as for the consideration of IVIG in the setting of necrotizing streptococcal skin and soft tissue infection.   Past Medical History:  Diagnosis Date  . Depression   . DM (diabetes mellitus) ([place])   . GERD (gastroesophageal reflux disease)   . HLD (hyperlipidemia)   . Obesity   . Tobacco abuse     Social History   Tobacco Use  . Smoking status: Current Every Day Smoker  . Smokeless tobacco: Never Used    History reviewed. No pertinent family history.  No Known Allergies  Review of Systems  Unable to perform ROS: Intubated    OBJECTIVE:   Blood pressure 115/67, pulse (!) 118, temperature (!) 95.9 F (35.5 C), temperature source Axillary, resp. rate (!) 30, height 5' 2" (1.575 m), weight 104.4 kg, SpO2 98 %. Body mass index is 42.1 kg/m.  Physical Exam Constitutional:      Comments: Critically ill appearing, intubated, sedated.   HENT:     Head: Normocephalic and atraumatic.     Mouth/Throat:     Comments: ET Tube in place.  OG Tube in place.  Eyes:     General: No scleral icterus. Neck:     Comments: Right IJ HD catheter. Cardiovascular:     Rate and Rhythm: Regular rhythm. Tachycardia present.     Pulses: Normal pulses.  Pulmonary:     Effort: Pulmonary effort is normal. No respiratory distress.     Comments: Ventilated breath sounds.  Abdominal:     General: There is no distension.     Palpations: Abdomen is soft.     Tenderness: There is no abdominal tenderness.  Musculoskeletal:     Cervical back: Normal range of motion and neck supple.     Right lower leg: No edema.     Left lower leg: No edema.  Skin:    General: Skin  is warm and dry.     Findings: No rash.  Neurological:     Comments: Sedated.       Lab Results: Lab Results  Component Value Date   WBC 60.9 (HH) 12/29/2020   HGB 11.1 (L) 12/29/2020   HCT 31.1 (L) 12/29/2020   MCV 90.4 12/29/2020   PLT 156 12/29/2020    Lab Results  Component Value Date   NA 135 12/29/2020   K 3.6 12/29/2020   CO2 16 (L) 12/29/2020   GLUCOSE 185 (H) 12/29/2020   BUN 35 (H) 12/29/2020   CREATININE 4.66 (H) 12/29/2020   CALCIUM 7.9 (L) 12/29/2020   GFRNONAA 11 (L) 12/29/2020    Lab Results  Component Value Date   ALT 19 12/29/2020   AST 35 12/29/2020   ALKPHOS 353 (H) 12/29/2020   BILITOT 6.0 (H) 12/29/2020    No results found for: CRP  No results found for: ESRSEDRATE  I have reviewed the micro and lab results in Epic.  Imaging: DG CHEST PORT 1 VIEW  Result Date: 12/28/2020 CLINICAL DATA:  ET tube adjusted EXAM: PORTABLE CHEST 1 VIEW COMPARISON:  12/28/2020 FINDINGS: Endotracheal tube has been retracted with the tip now approximately 4 cm above the carina. Right Central line and NG tube are unchanged. Increasing bilateral perihilar and right lower lobe airspace disease. Heart is borderline in size. IMPRESSION: Endotracheal tube has been retracted, now 4 cm above the carina. Other support devices stable. Increasing bilateral perihilar and right lower lobe airspace disease. Electronically Signed   By: Rolm Baptise M.D.   On: 12/28/2020 22:11   DG CHEST PORT 1 VIEW  Result Date: 12/28/2020 CLINICAL DATA:  Hypoxia EXAM: PORTABLE CHEST 1 VIEW COMPARISON:  December 28, 2020 study obtained earlier in the day FINDINGS: Endotracheal tube tip is in the proximal right main bronchus. Nasogastric tube tip and side port are in the stomach. Central catheter tip is in the superior vena cava. No pneumothorax. There is mild left base atelectasis. No edema or airspace opacity. There is stable cardiomegaly with pulmonary vascularity normal. No adenopathy. No bone lesions.  IMPRESSION: Tube and catheter positions as described without pneumothorax. The endotracheal tube tip is in the proximal right main bronchus. Advise withdrawing endotracheal tube approximately 4 cm. Mild left base atelectasis. No edema or consolidation. Stable cardiomegaly. Critical Value/emergent results were called by telephone at the time of interpretation on 12/28/2020 at 9:49 pm to provider Sandford Craze, RN, who verbally acknowledged these results. Electronically Signed   By: Lowella Grip III M.D.   On: 12/28/2020 21:50   DG CHEST PORT 1 VIEW  Result Date: 12/28/2020 CLINICAL DATA:  Central line placement EXAM: PORTABLE CHEST 1 VIEW COMPARISON:  12/27/2020 FINDINGS: Right dialysis catheter in place with the tip at the cavoatrial junction. No pneumothorax. Cardiomegaly with vascular congestion. Right lower lobe atelectasis or infiltrate. Possible small layering right effusion. No overt edema. No acute bony abnormality. IMPRESSION: Cardiomegaly, vascular congestion. Right lower lobe atelectasis or infiltrate with suspected small right effusion. Electronically Signed   By: Rolm Baptise M.D.   On: 12/28/2020 17:36   DG Abd Portable 1V  Result Date: 12/28/2020 CLINICAL DATA:  OG tube placement EXAM: PORTABLE ABDOMEN - 1 VIEW COMPARISON:  12/28/2020 FINDINGS: OG tube has been advanced with the tip in the right abdomen. This could be in the distal stomach or proximal duodenum. Prior cholecystectomy. IMPRESSION: Advancement of the NG tube into the distal stomach or proximal duodenum. Electronically Signed   By: Rolm Baptise M.D.   On: 12/28/2020 23:04   DG Abd Portable 1V  Result Date: 12/28/2020 CLINICAL DATA:  OG tube placement EXAM: PORTABLE ABDOMEN - 1 VIEW COMPARISON:  None. FINDINGS: OG tube tip is in the proximal stomach with the side port near the GE junction. Prior cholecystectomy. IMPRESSION: OG tube tip in the proximal stomach. Electronically Signed   By: Rolm Baptise M.D.   On: 12/28/2020 21:45      Imaging independently reviewed in Epic.  [facility] [phone] pager 12/29/2020, 11:44 AM

## 2020-12-29 NOTE — Progress Notes (Signed)
NAME:  [name] [name], MRN:  [id number], DOB:  04-15-1971, LOS: 1 ADMISSION DATE:  12/28/2020, CONSULTATION DATE:  6/3 REFERRING MD:  [facility] MD, CHIEF COMPLAINT:  Fever, pain and ulceration  History of Present Illness:  50 y/o female initially admitted to [place] on 5/29 complaining of fever and a buttock abscess, moved to [facility] on 6/3 in the setting of worsening renal failure, acidosis, encephaloapthy with known group B strep wound culture.  Pertinent  Medical History  Recent Vulvar irritation and Bartholin Cyst seen at [facility]  HTN DM type II Cholecystectomy  GERD Anxiety  Significant Hospital Events: Including procedures, antibiotic start and stop dates in addition to other pertinent events    5/29 admitted to [place] sepsis felt 2/2 cellulitis on buttocks. Cultures sent. CT abd/pelvis + [place] gas but no drainable abscess. Started on Vanc. IVFs. Wound also cultured.   6/1 wound culture back + group B strep. 1 of 4 blood cultures + GPC. ABX changed to rocephin  6/3 Arrived at  [facility] from [place] w/ new hypotension, worsening renal failure, worsening leukocytosis, poorly controlled pain and new encephalopathy. Admitted to ICU. Started on zyvox and meropenem. Surgical consult requested> to OR for debridement . 6/3 meropenem >  . 6/3 linezolid >  . 6/4 clindamycin >  . 6/4 IVIg >  Interim History / Subjective:   To OR yeserday Agitated Not making urine On crrt On levophed and vasopressin Receiving high dose insulin with accuchecks   Objective   Blood pressure 108/61, pulse (!) 108, temperature (!) 95 F (35 C), temperature source Axillary, resp. rate (!) 30, height 5\' 2"  (1.575 m), weight 104.4 kg, SpO2 100 %.    Vent Mode: PRVC FiO2 (%):  [50 %-60 %] 50 % Set Rate:  [16 bmp-30 bmp] 30 bmp Vt Set:  [400 mL] 400 mL PEEP:  [5 cmH20] 5 cmH20 Plateau Pressure:  [14 cmH20-22 cmH20] 14 cmH20   Intake/Output Summary (Last 24 hours) at 12/29/2020 0737 Last data filed  at 12/29/2020 0700 Gross per 24 hour  Intake 3211.08 ml  Output 2316 ml  Net 895.08 ml   Filed Weights   12/28/20 1515 12/29/20 0208  Weight: 104.3 kg 104.4 kg    Examination:  General:  In bed on vent HENT: NCAT ETT in place PULM: CTA B, vent supported breathing CV: RRR, no mgr GI: BS+, soft, nontender MSK: normal bulk and tone Neuro: sedated on ventilator   Labs/imaging that I havepersonally reviewed  (right click and "Reselect all SmartList Selections" daily)  WBC 60k Cr 3.7   Resolved Hospital Problem list     Assessment & Plan:   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, s/p wound exploration and debridement on 6/3 Change dressing today Maintain intubated today in case she needs to return to OR tomorrow ID consult> add clindamycin and IVIg Continue meropenem and linezolid  Oliguric AKI Continue CRRT F/u nephrology recommendations Monitor BMET and UOP Replace electrolytes as needed  Acute respiratory failure with hypoxemia Full mechanical vent support VAP prevention Daily WUA/SBT CXR prn  Mild normocytic anemia without bleeding Monitor for bleeding Transfuse PRBC for Hgb < 7 gm/dL  DM2 SSI Tube feeding coverage> add today  Acute metabolic encephalopathy Need for sedation for mechanical ventilation RASS goal -2 Add PAD protocol Fentanyl infusion per protocol Add versed prn    Best practice (right click and "Reselect all SmartList Selections" daily)  Diet:  Tube Feed  Pain/Anxiety/Delirium protocol (if indicated): Yes (RASS goal -2) VAP  protocol (if indicated): Yes DVT prophylaxis: Subcutaneous Heparin GI prophylaxis: H2B Glucose control:  SSI Yes Central venous access:  Yes, and it is still needed Arterial line:  Yes, and it is still needed Foley:  Yes, and it is still needed Mobility:  bed rest  PT consulted: Yes Last date of multidisciplinary goals of care discussion [unknown] Code Status:  full code Disposition: remain in  ICU  Labs   CBC: Recent Labs  Lab 12/28/20 1641 12/28/20 2236 12/28/20 2326 12/29/20 0217 12/29/20 0253  WBC 52.3*  --  77.5*  --  60.9*  HGB 11.4* 12.2 11.6* 10.9* 11.1*  HCT 32.7* 36.0 34.5* 32.0* 31.1*  MCV 90.6  --  94.3  --  90.4  PLT 195  --  253  --  156    Basic Metabolic Panel: Recent Labs  Lab 12/28/20 1641 12/28/20 2236 12/28/20 2326 12/29/20 0217 12/29/20 0253  NA 131* 135 132* 135 135  K 4.0 3.8 3.8 3.5 3.6  CL 100  --  102  --  100  CO2 12*  --  16*  --  16*  GLUCOSE 133*  --  200*  --  185*  BUN 45*  --  43*  --  35*  CREATININE 5.97*  --  5.55*  --  4.66*  CALCIUM 8.7*  --  8.2*  --  7.9*  MG 1.6*  --   --   --  1.9   GFR: Estimated Creatinine Clearance: 16.9 mL/min (A) (by C-G formula based on SCr of 4.66 mg/dL (H)). Recent Labs  Lab 12/28/20 1641 12/28/20 1824 12/28/20 2326 12/29/20 0253  PROCALCITON 17.91  --   --   --   WBC 52.3*  --  77.5* 60.9*  LATICACIDVEN 3.3* 3.4* 5.1*  --     Liver Function Tests: Recent Labs  Lab 12/28/20 1641 12/28/20 2326 12/29/20 0253  AST 40 40 35  ALT 19 20 19   ALKPHOS 422* 449* 353*  BILITOT 6.6* 6.0* 6.0*  PROT 6.1* 6.2* 5.4*  ALBUMIN 1.6* 1.6* 1.4*   No results for input(s): LIPASE, AMYLASE in the last 168 hours. No results for input(s): AMMONIA in the last 168 hours.  ABG    Component Value Date/Time   PHART 7.333 (L) 12/29/2020 0217   PCO2ART 36.4 12/29/2020 0217   PO2ART 209 (H) 12/29/2020 0217   HCO3 19.4 (L) 12/29/2020 0217   TCO2 20 (L) 12/29/2020 0217   ACIDBASEDEF 6.0 (H) 12/29/2020 0217   O2SAT 100.0 12/29/2020 0217     Coagulation Profile: Recent Labs  Lab 12/28/20 1641 12/28/20 2326  INR 1.3* 1.5*    Cardiac Enzymes: No results for input(s): CKTOTAL, CKMB, CKMBINDEX, TROPONINI in the last 168 hours.  HbA1C: No results found for: HGBA1C  CBG: Recent Labs  Lab 12/28/20 2122 12/28/20 2325 12/29/20 0259 12/29/20 0714  GLUCAP 152* 195* 187* 173*     Critical  care time: 34 minutes     02/28/21, MD [place] PCCM Pager: [phone] Cell: [phone] If no response, please call [phone] until 7pm After 7:00 pm call Elink  [phone]

## 2020-12-29 NOTE — Progress Notes (Signed)
[facility] P[facility]M  I updated her sister Joylene Igo this evening by phone  Heber Glend[place], MD [place] [facility] Pager: [phone] Cell: [phone] If no response, please call [phone] until 7pm After 7:00 pm call Elink  [phone]

## 2020-12-29 NOTE — Progress Notes (Signed)
CRRT started. Patient resting in bed.

## 2020-12-29 NOTE — Progress Notes (Signed)
1 Day Post-Op   Subjective/Chief Complaint: On vent and on CRRT   Objective: Vital signs in last 24 hours: Temp:  [94.4 F (34.7 C)-98.3 F (36.8 C)] 95.9 F (35.5 C) (06/04 0700) Pulse Rate:  [87-118] 108 (06/04 0700) Resp:  [4-30] 30 (06/04 0700) BP: (81-133)/(42-106) 108/61 (06/04 0700) SpO2:  [93 %-100 %] 100 % (06/04 0700) Arterial Line BP: (94-131)/(47-92) 94/59 (06/04 0700) FiO2 (%):  [50 %-60 %] 50 % (06/04 0300) Weight:  [104.3 kg-104.4 kg] 104.4 kg (06/04 0208) Last BM Date:  (pta)  Intake/Output from previous day: 06/03 0701 - 06/04 0700 In: 3211.1 [I.V.:2623.3; IV Piggyback:587.8] Out: 2316 [Blood:300] Intake/Output this shift: No intake/output data recorded.  Exam: Intubated and sedated Dressing to large left buttock wound changed. No purulence or further necrosis currently.  Moderate surrounding cellulitis  Lab Results:  Recent Labs    12/28/20 2326 12/29/20 0217 12/29/20 0253  WBC 77.5*  --  60.9*  HGB 11.6* 10.9* 11.1*  HCT 34.5* 32.0* 31.1*  PLT 253  --  156   BMET Recent Labs    12/28/20 2326 12/29/20 0217 12/29/20 0253  NA 132* 135 135  K 3.8 3.5 3.6  CL 102  --  100  CO2 16*  --  16*  GLUCOSE 200*  --  185*  BUN 43*  --  35*  CREATININE 5.55*  --  4.66*  CALCIUM 8.2*  --  7.9*   PT/INR Recent Labs    12/28/20 1641 12/28/20 2326  LABPROT 16.4* 17.9*  INR 1.3* 1.5*   ABG Recent Labs    12/28/20 2236 12/29/20 0217  PHART 7.030* 7.333*  HCO3 16.8* 19.4*    Studies/Results: DG CHEST PORT 1 VIEW  Result Date: 12/28/2020 CLINICAL DATA:  ET tube adjusted EXAM: PORTABLE CHEST 1 VIEW COMPARISON:  12/28/2020 FINDINGS: Endotracheal tube has been retracted with the tip now approximately 4 cm above the carina. Right Central line and NG tube are unchanged. Increasing bilateral perihilar and right lower lobe airspace disease. Heart is borderline in size. IMPRESSION: Endotracheal tube has been retracted, now 4 cm above the carina. Other  support devices stable. Increasing bilateral perihilar and right lower lobe airspace disease. Electronically Signed   By: Charlett Nose M.D.   On: 12/28/2020 22:11   DG CHEST PORT 1 VIEW  Result Date: 12/28/2020 CLINICAL DATA:  Hypoxia EXAM: PORTABLE CHEST 1 VIEW COMPARISON:  December 28, 2020 study obtained earlier in the day FINDINGS: Endotracheal tube tip is in the proximal right main bronchus. Nasogastric tube tip and side port are in the stomach. Central catheter tip is in the superior vena cava. No pneumothorax. There is mild left base atelectasis. No edema or airspace opacity. There is stable cardiomegaly with pulmonary vascularity normal. No adenopathy. No bone lesions. IMPRESSION: Tube and catheter positions as described without pneumothorax. The endotracheal tube tip is in the proximal right main bronchus. Advise withdrawing endotracheal tube approximately 4 cm. Mild left base atelectasis. No edema or consolidation. Stable cardiomegaly. Critical Value/emergent results were called by telephone at the time of interpretation on 12/28/2020 at 9:49 pm to provider Lincoln Brigham, RN, who verbally acknowledged these results. Electronically Signed   By: Bretta Bang III M.D.   On: 12/28/2020 21:50   DG CHEST PORT 1 VIEW  Result Date: 12/28/2020 CLINICAL DATA:  Central line placement EXAM: PORTABLE CHEST 1 VIEW COMPARISON:  12/27/2020 FINDINGS: Right dialysis catheter in place with the tip at the cavoatrial junction. No pneumothorax. Cardiomegaly with vascular  congestion. Right lower lobe atelectasis or infiltrate. Possible small layering right effusion. No overt edema. No acute bony abnormality. IMPRESSION: Cardiomegaly, vascular congestion. Right lower lobe atelectasis or infiltrate with suspected small right effusion. Electronically Signed   By: Charlett Nose M.D.   On: 12/28/2020 17:36   DG Abd Portable 1V  Result Date: 12/28/2020 CLINICAL DATA:  OG tube placement EXAM: PORTABLE ABDOMEN - 1 VIEW COMPARISON:   12/28/2020 FINDINGS: OG tube has been advanced with the tip in the right abdomen. This could be in the distal stomach or proximal duodenum. Prior cholecystectomy. IMPRESSION: Advancement of the NG tube into the distal stomach or proximal duodenum. Electronically Signed   By: Charlett Nose M.D.   On: 12/28/2020 23:04   DG Abd Portable 1V  Result Date: 12/28/2020 CLINICAL DATA:  OG tube placement EXAM: PORTABLE ABDOMEN - 1 VIEW COMPARISON:  None. FINDINGS: OG tube tip is in the proximal stomach with the side port near the GE junction. Prior cholecystectomy. IMPRESSION: OG tube tip in the proximal stomach. Electronically Signed   By: Charlett Nose M.D.   On: 12/28/2020 21:45    Anti-infectives: Anti-infectives (From admission, onward)   Start     Dose/Rate Route Frequency Ordered Stop   12/28/20 2200  linezolid (ZYVOX) IVPB 600 mg        600 mg 300 mL/hr over 60 Minutes Intravenous Every 12 hours 12/28/20 1620     12/28/20 1830  meropenem (MERREM) 1 g in sodium chloride 0.9 % 100 mL IVPB        1 g 200 mL/hr over 30 Minutes Intravenous Every 8 hours 12/28/20 1731     12/28/20 1800  meropenem (MERREM) 1 g in sodium chloride 0.9 % 100 mL IVPB  Status:  Discontinued        1 g 200 mL/hr over 30 Minutes Intravenous Every 24 hours 12/28/20 1700 12/28/20 1731   12/28/20 1730  meropenem (MERREM) 1 g in sodium chloride 0.9 % 100 mL IVPB  Status:  Discontinued        1 g 200 mL/hr over 30 Minutes Intravenous Every 8 hours 12/28/20 1639 12/28/20 1700      Assessment/Plan: Necrotizing soft tissue infection of the left buttock/posterior thigh   POD#1  Wound currently stable Continue aggressive wound care Continuing care per CCM Hopefully will not have to return to the OR but will monitor wound closely    LOS: 1 day    Abigail Miyamoto MD 12/29/2020

## 2020-12-30 ENCOUNTER — Inpatient Hospital Stay (HOSPITAL_COMMUNITY): Payer: Medicaid Other

## 2020-12-30 DIAGNOSIS — M7989 Other specified soft tissue disorders: Secondary | ICD-10-CM | POA: Diagnosis not present

## 2020-12-30 DIAGNOSIS — N179 Acute kidney failure, unspecified: Secondary | ICD-10-CM | POA: Diagnosis not present

## 2020-12-30 DIAGNOSIS — J9602 Acute respiratory failure with hypercapnia: Secondary | ICD-10-CM | POA: Diagnosis not present

## 2020-12-30 LAB — CBC WITH DIFFERENTIAL/PLATELET
Abs Immature Granulocytes: 1.78 10*3/uL — ABNORMAL HIGH (ref 0.00–0.07)
Basophils Absolute: 0 10*3/uL (ref 0.0–0.1)
Basophils Relative: 0 %
Eosinophils Absolute: 0 10*3/uL (ref 0.0–0.5)
Eosinophils Relative: 0 %
HCT: 27.2 % — ABNORMAL LOW (ref 36.0–46.0)
Hemoglobin: 9.8 g/dL — ABNORMAL LOW (ref 12.0–15.0)
Immature Granulocytes: 4 %
Lymphocytes Relative: 6 %
Lymphs Abs: 2.6 10*3/uL (ref 0.7–4.0)
MCH: 31.5 pg (ref 26.0–34.0)
MCHC: 36 g/dL (ref 30.0–36.0)
MCV: 87.5 fL (ref 80.0–100.0)
Monocytes Absolute: 1.2 10*3/uL — ABNORMAL HIGH (ref 0.1–1.0)
Monocytes Relative: 3 %
Neutro Abs: 38.4 10*3/uL — ABNORMAL HIGH (ref 1.7–7.7)
Neutrophils Relative %: 87 %
Platelets: 149 10*3/uL — ABNORMAL LOW (ref 150–400)
RBC: 3.11 MIL/uL — ABNORMAL LOW (ref 3.87–5.11)
RDW: 13.2 % (ref 11.5–15.5)
WBC: 44 10*3/uL — ABNORMAL HIGH (ref 4.0–10.5)
nRBC: 0.6 % — ABNORMAL HIGH (ref 0.0–0.2)

## 2020-12-30 LAB — RENAL FUNCTION PANEL
Albumin: 1.2 g/dL — ABNORMAL LOW (ref 3.5–5.0)
Anion gap: 9 (ref 5–15)
BUN: 26 mg/dL — ABNORMAL HIGH (ref 6–20)
CO2: 27 mmol/L (ref 22–32)
Calcium: 7.4 mg/dL — ABNORMAL LOW (ref 8.9–10.3)
Chloride: 94 mmol/L — ABNORMAL LOW (ref 98–111)
Creatinine, Ser: 2.16 mg/dL — ABNORMAL HIGH (ref 0.44–1.00)
GFR, Estimated: 28 mL/min — ABNORMAL LOW (ref >60)
Glucose, Bld: 236 mg/dL — ABNORMAL HIGH (ref 70–99)
Phosphorus: 1.9 mg/dL — ABNORMAL LOW (ref 2.5–4.6)
Potassium: 3.8 mmol/L (ref 3.5–5.1)
Sodium: 130 mmol/L — ABNORMAL LOW (ref 135–145)

## 2020-12-30 LAB — COMPREHENSIVE METABOLIC PANEL
ALT: 14 U/L (ref 0–44)
AST: 29 U/L (ref 15–41)
Albumin: 1.1 g/dL — ABNORMAL LOW (ref 3.5–5.0)
Alkaline Phosphatase: 299 U/L — ABNORMAL HIGH (ref 38–126)
Anion gap: 8 (ref 5–15)
BUN: 27 mg/dL — ABNORMAL HIGH (ref 6–20)
CO2: 28 mmol/L (ref 22–32)
Calcium: 7 mg/dL — ABNORMAL LOW (ref 8.9–10.3)
Chloride: 93 mmol/L — ABNORMAL LOW (ref 98–111)
Creatinine, Ser: 2.58 mg/dL — ABNORMAL HIGH (ref 0.44–1.00)
GFR, Estimated: 22 mL/min — ABNORMAL LOW (ref >60)
Glucose, Bld: 237 mg/dL — ABNORMAL HIGH (ref 70–99)
Potassium: 3.5 mmol/L (ref 3.5–5.1)
Sodium: 129 mmol/L — ABNORMAL LOW (ref 135–145)
Total Bilirubin: 3.8 mg/dL — ABNORMAL HIGH (ref 0.3–1.2)
Total Protein: 7.3 g/dL (ref 6.5–8.1)

## 2020-12-30 LAB — MAGNESIUM
Magnesium: 2.3 mg/dL (ref 1.7–2.4)
Magnesium: 2.3 mg/dL (ref 1.7–2.4)

## 2020-12-30 LAB — GLUCOSE, CAPILLARY
Glucose-Capillary: 223 mg/dL — ABNORMAL HIGH (ref 70–99)
Glucose-Capillary: 232 mg/dL — ABNORMAL HIGH (ref 70–99)
Glucose-Capillary: 218 mg/dL — ABNORMAL HIGH (ref 70–99)
Glucose-Capillary: 216 mg/dL — ABNORMAL HIGH (ref 70–99)
Glucose-Capillary: 230 mg/dL — ABNORMAL HIGH (ref 70–99)
Glucose-Capillary: 225 mg/dL — ABNORMAL HIGH (ref 70–99)

## 2020-12-30 LAB — TROPONIN I (HIGH SENSITIVITY): Troponin I (High Sensitivity): 88 ng/L — ABNORMAL HIGH (ref <18)

## 2020-12-30 LAB — PHOSPHORUS
Phosphorus: 2.4 mg/dL — ABNORMAL LOW (ref 2.5–4.6)
Phosphorus: 1.9 mg/dL — ABNORMAL LOW (ref 2.5–4.6)

## 2020-12-30 MED ORDER — B COMPLEX-C PO TABS
1.0000 | ORAL_TABLET | Freq: Every day | ORAL | Status: DC
  Administered 2020-12-30 – 2021-01-06 (×8): 1
  Filled 2020-12-30 (×8): qty 1

## 2020-12-30 MED ORDER — OXYCODONE HCL 5 MG/5ML PO SOLN
10.0000 mg | Freq: Three times a day (TID) | ORAL | Status: DC
  Administered 2020-12-30 – 2021-01-02 (×11): 10 mg
  Filled 2020-12-30 (×11): qty 10

## 2020-12-30 MED ORDER — K PHOS MONO-SOD PHOS DI & MONO 155-852-130 MG PO TABS
500.0000 mg | ORAL_TABLET | Freq: Two times a day (BID) | ORAL | Status: DC
  Administered 2020-12-30 (×2): 500 mg
  Filled 2020-12-30 (×3): qty 2

## 2020-12-30 MED ORDER — POTASSIUM CHLORIDE 20 MEQ PO PACK
40.0000 meq | PACK | Freq: Once | ORAL | Status: AC
  Administered 2020-12-30: 40 meq
  Filled 2020-12-30: qty 2

## 2020-12-30 MED ORDER — PRISMASOL BGK 4/2.5 32-4-2.5 MEQ/L REPLACEMENT SOLN
Status: DC
  Filled 2020-12-30 (×10): qty 5000

## 2020-12-30 MED ORDER — PANTOPRAZOLE SODIUM 40 MG PO PACK
40.0000 mg | PACK | Freq: Every day | ORAL | Status: DC
  Administered 2020-12-30 – 2021-01-06 (×8): 40 mg
  Filled 2020-12-30 (×8): qty 20

## 2020-12-30 MED ORDER — INSULIN ASPART 100 UNIT/ML IJ SOLN
12.0000 [IU] | INTRAMUSCULAR | Status: DC
  Administered 2020-12-30 – 2020-12-31 (×6): 12 [IU] via SUBCUTANEOUS

## 2020-12-30 MED ORDER — PROSOURCE TF PO LIQD
45.0000 mL | Freq: Four times a day (QID) | ORAL | Status: DC
  Administered 2020-12-30 – 2021-01-06 (×29): 45 mL
  Filled 2020-12-30 (×29): qty 45

## 2020-12-30 MED ORDER — VITAL 1.5 CAL PO LIQD
1000.0000 mL | ORAL | Status: DC
  Administered 2020-12-30 – 2021-01-03 (×5): 1000 mL

## 2020-12-30 NOTE — Progress Notes (Signed)
Update provided to [facility], patients sister, via phone. Sister is grateful for her care.

## 2020-12-30 NOTE — Anesthesia Postprocedure Evaluation (Signed)
Anesthesia Post Note  Patient: [name] [name]  Procedure(s) Performed: DEBRIDEMENT LEFT BUTTOCK NECROTIZING SOFT TISSUE INFECTION - TOTAL TISSUE VOLUME 14CM X 12CM X 3CM DEEP (Left Buttocks)     Patient location during evaluation: SICU Anesthesia Type: General Level of consciousness: sedated Pain management: pain level controlled Vital Signs Assessment: post-procedure vital signs reviewed and stable Respiratory status: patient remains intubated per anesthesia plan Cardiovascular status: blood pressure returned to baseline and stable Postop Assessment: no apparent nausea or vomiting Anesthetic complications: no   No complications documented.  Last Vitals:  Vitals:   12/30/20 0645 12/30/20 0700  BP:    Pulse: 87   Resp: (!) 30   Temp:  36.7 C  SpO2: 98%     Last Pain:  Vitals:   12/30/20 0700  TempSrc: Axillary  PainSc:                  [name] [name]

## 2020-12-30 NOTE — Progress Notes (Signed)
NAME:  [name] [name], MRN:  [id number], DOB:  10/12/1970, LOS: 2 ADMISSION DATE:  12/28/2020, CONSULTATION DATE:  6/3 REFERRING MD:  [facility] MD, CHIEF COMPLAINT:  Fever, pain and ulceration  History of Present Illness:  50 y/o female initially admitted to [place] on 5/29 complaining of fever and a buttock abscess, moved to [facility] on 6/3 in the setting of worsening renal failure, acidosis, encephaloapthy with known group B strep wound culture.  Pertinent  Medical History  Recent Vulvar irritation and Bartholin Cyst seen at [facility]  HTN DM type II Cholecystectomy  GERD Anxiety  Significant Hospital Events: Including procedures, antibiotic start and stop dates in addition to other pertinent events    5/29 admitted to [place] sepsis felt 2/2 cellulitis on buttocks. Cultures sent. CT abd/pelvis + [place] gas but no drainable abscess. Started on Vanc. IVFs. Wound also cultured.   6/1 wound culture back + group B strep. 1 of 4 blood cultures + GPC. ABX changed to rocephin  6/3 Arrived at  [facility] from [place] w/ new hypotension, worsening renal failure, worsening leukocytosis, poorly controlled pain and new encephalopathy. Admitted to ICU. Started on zyvox and meropenem. Surgical consult requested> to OR for debridement . 6/3 meropenem >  . 6/3 linezolid >  . 6/4 clindamycin >  . 6/4 IVIg >  Interim History / Subjective:   Off levophed this morning Not weaning on vent yet No bowel movement Wound change by surgery this moring> no OR today Intermittently agitated Remains on CRRT  Objective   Blood pressure 107/63, pulse 87, temperature 98 F (36.7 C), temperature source Axillary, resp. rate (!) 30, height 5\' 2"  (1.575 m), weight 103.7 kg, SpO2 98 %.    Vent Mode: PRVC FiO2 (%):  [30 %-40 %] 30 % Set Rate:  [30 bmp] 30 bmp Vt Set:  [400 mL] 400 mL PEEP:  [5 cmH20] 5 cmH20 Plateau Pressure:  [16 cmH20-18 cmH20] 18 cmH20   Intake/Output Summary (Last 24 hours) at 12/30/2020  0733 Last data filed at 12/30/2020 0700 Gross per 24 hour  Intake 5631.3 ml  Output 7160 ml  Net -1528.7 ml   Filed Weights   12/28/20 1515 12/29/20 0208 12/30/20 0304  Weight: 104.3 kg 104.4 kg 103.7 kg    Examination:  General:  In bed on vent HENT: NCAT ETT in place PULM: CTA B, vent supported breathing CV: RRR, no mgr GI: BS+, soft, nontender MSK: normal bulk and tone Neuro: sedated on vent    Labs/imaging that I havepersonally reviewed  (right click and "Reselect all SmartList Selections" daily)  WBC 44k Cultures without growth  Resolved Hospital Problem list     Assessment & Plan:   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, s/p wound exploration and debridement on 6/3 Continue wound dressing changes F/u with surgery this week> back to OR? Continue mero/linezolid/clinda/IVIg  Oliguric AKI Continue CRRT Monitor BMET and UOP Replace electrolytes as needed Appreciate renal, removing volume slowly  Acute respiratory failure with hypoxemia Pressure support wean this morning, WUA/SBT now VAP prevention Daily WUA/SBT CXR PRN  Mild normocytic anemia without bleeding Monitor for bleeding Transfuse PRBC for Hgb < 7 gm/dL  DM2 with hyperglycemia SSI Tube feeding coverage > increase tdoay  Acute metabolic encephalopathy Need for sedation for mechanical ventilation RASS goal -2 Add PAD protocol Fentanyl infusion per protocol Versed prn    Best practice (right click and "Reselect all SmartList Selections" daily)  Diet:  Tube Feed  Pain/Anxiety/Delirium protocol (if indicated): Yes (  RASS goal -2) VAP protocol (if indicated): Yes DVT prophylaxis: Subcutaneous Heparin GI prophylaxis: H2B Glucose control:  SSI Yes Central venous access:  Yes, and it is still needed Arterial line:  Yes, and it is still needed Foley:  Yes, and it is still needed Mobility:  bed rest  PT consulted: Yes Last date of multidisciplinary goals of care discussion  [called her sister for an update on 6/5, have not had goals of care conversation at this point] Code Status:  full code Disposition: remain in ICU  Labs   CBC: Recent Labs  Lab 12/28/20 1641 12/28/20 2033 12/28/20 2236 12/28/20 2326 12/29/20 0217 12/29/20 0253 12/30/20 0420  WBC 52.3*  --   --  77.5*  --  60.9* 44.0*  NEUTROABS  --   --   --   --   --   --  38.4*  HGB 11.4*   < > 12.2 11.6* 10.9* 11.1* 9.8*  HCT 32.7*   < > 36.0 34.5* 32.0* 31.1* 27.2*  MCV 90.6  --   --  94.3  --  90.4 87.5  PLT 195  --   --  253  --  156 149*   < > = values in this interval not displayed.    Basic Metabolic Panel: Recent Labs  Lab 12/28/20 1641 12/28/20 2033 12/28/20 2326 12/29/20 0217 12/29/20 0253 12/29/20 1133 12/29/20 1824 12/30/20 0420  NA 131*   < > 132* 135 135 134* 132* 129*  K 4.0   < > 3.8 3.5 3.6 3.7 3.3* 3.5  CL 100  --  102  --  100 99 96* 93*  CO2 12*  --  16*  --  16* 21* 24 28  GLUCOSE 133*  --  200*  --  185* 196* 173* 237*  BUN 45*  --  43*  --  35* 31* 26* 27*  CREATININE 5.97*  --  5.55*  --  4.66* 3.73* 3.24* 2.58*  CALCIUM 8.7*  --  8.2*  --  7.9* 7.9* 7.5* 7.0*  MG 1.6*  --   --   --  1.9 2.0 1.9 2.3  PHOS  --   --   --   --   --  3.7 3.0  3.1 2.4*   < > = values in this interval not displayed.   GFR: Estimated Creatinine Clearance: 30.4 mL/min (A) (by C-G formula based on SCr of 2.58 mg/dL (H)). Recent Labs  Lab 12/28/20 1641 12/28/20 1824 12/28/20 2326 12/29/20 0253 12/30/20 0420  PROCALCITON 17.91  --   --   --   --   WBC 52.3*  --  77.5* 60.9* 44.0*  LATICACIDVEN 3.3* 3.4* 5.1*  --   --     Liver Function Tests: Recent Labs  Lab 12/28/20 1641 12/28/20 2326 12/29/20 0253 12/29/20 1824 12/30/20 0420  AST 40 40 35  --  29  ALT 19 20 19   --  14  ALKPHOS 422* 449* 353*  --  299*  BILITOT 6.6* 6.0* 6.0*  --  3.8*  PROT 6.1* 6.2* 5.4*  --  7.3  ALBUMIN 1.6* 1.6* 1.4* 1.2* 1.1*   No results for input(s): LIPASE, AMYLASE in the last 168  hours. No results for input(s): AMMONIA in the last 168 hours.  ABG    Component Value Date/Time   PHART 7.333 (L) 12/29/2020 0217   PCO2ART 36.4 12/29/2020 0217   PO2ART 209 (H) 12/29/2020 0217   HCO3 19.4 (L) 12/29/2020 0217  TCO2 20 (L) 12/29/2020 0217   ACIDBASEDEF 6.0 (H) 12/29/2020 0217   O2SAT 100.0 12/29/2020 0217     Coagulation Profile: Recent Labs  Lab 12/28/20 1641 12/28/20 2326  INR 1.3* 1.5*    Cardiac Enzymes: No results for input(s): CKTOTAL, CKMB, CKMBINDEX, TROPONINI in the last 168 hours.  HbA1C: No results found for: HGBA1C  CBG: Recent Labs  Lab 12/29/20 1539 12/29/20 1918 12/29/20 2320 12/30/20 0310 12/30/20 0716  GLUCAP 167* 178* 197* 223* 232*     Critical care time: 35 minutes     Heber [place], MD [place] [facility] Pager: [phone] Cell: [phone] If no response, please call [phone] until 7pm After 7:00 pm call Elink  [phone]

## 2020-12-30 NOTE — Progress Notes (Signed)
Patient ID: [name] [name], female   DOB: 1970-07-29, 50 y.o.   MRN: [id number] [facility] Progress Note   Assessment/ Plan:   1.  Acute kidney injury: Likely ATN associated with sepsis with a background of contrast exposure and vancomycin use as well (levels unavailable).  She is anuric and was started on CRRT yesterday for management of multiple metabolic abnormalities.  I have adjusted dialysate as she is now trending towards alkalemia. 2.  Anion gap metabolic acidosis: Secondary to acute kidney injury/lactic acidosis and associated sepsis.  Corrected with CRRT/sodium bicarbonate overnight. 3.  Hyponatremia: Secondary to acute kidney injury and impaired free water handling.  Monitor with CRRT. 4.  Left gluteal cellulitis/possible abscess:   Currently on linezolid and meropenem for broad-spectrum antimicrobial coverage following debridement of left buttock necrotizing soft tissue infection 2 days ago.  Yesterday given IVIG. 5.  Type 2 diabetes mellitus: Management per critical care protocol/service.  Subjective:   No acute clinical events overnight, CRRT filter clotted once yesterday evening at 8:30 PM but able to return blood.   Objective:   BP 107/63   Pulse 87   Temp (!) 97.5 F (36.4 C) (Axillary)   Resp (!) 30   Ht _0  (1.575 m)   Wt 103.7 kg   SpO2 98%   BMI 41.81 kg/m   Intake/Output Summary (Last 24 hours) at 12/30/2020 0641 Last data filed at 12/30/2020 0600 Gross per 24 hour  Intake 5668.12 ml  Output 7152 ml  Net -1483.88 ml   Weight change: -0.6 kg  Physical Exam: Gen: Intubated, sedated, appears comfortable on CRRT CVS: Regular rhythm, normal rate, S1 and S2 with ejection systolic murmur Resp: Anteriorly clear to auscultation bilaterally, no rales/rhonchi Abd: Soft, obese, nontender, bowel sounds normal Ext: Trace-1+ lower extremity edema  Imaging: DG CHEST PORT 1 VIEW  Result Date: 12/28/2020 CLINICAL DATA:  ET tube adjusted EXAM: PORTABLE  CHEST 1 VIEW COMPARISON:  12/28/2020 FINDINGS: Endotracheal tube has been retracted with the tip now approximately 4 cm above the carina. Right Central line and NG tube are unchanged. Increasing bilateral perihilar and right lower lobe airspace disease. Heart is borderline in size. IMPRESSION: Endotracheal tube has been retracted, now 4 cm above the carina. Other support devices stable. Increasing bilateral perihilar and right lower lobe airspace disease. Electronically Signed   By: Rolm Baptise M.D.   On: 12/28/2020 22:11   DG CHEST PORT 1 VIEW  Result Date: 12/28/2020 CLINICAL DATA:  Hypoxia EXAM: PORTABLE CHEST 1 VIEW COMPARISON:  December 28, 2020 study obtained earlier in the day FINDINGS: Endotracheal tube tip is in the proximal right main bronchus. Nasogastric tube tip and side port are in the stomach. Central catheter tip is in the superior vena cava. No pneumothorax. There is mild left base atelectasis. No edema or airspace opacity. There is stable cardiomegaly with pulmonary vascularity normal. No adenopathy. No bone lesions. IMPRESSION: Tube and catheter positions as described without pneumothorax. The endotracheal tube tip is in the proximal right main bronchus. Advise withdrawing endotracheal tube approximately 4 cm. Mild left base atelectasis. No edema or consolidation. Stable cardiomegaly. Critical Value/emergent results were called by telephone at the time of interpretation on 12/28/2020 at 9:49 pm to provider Sandford Craze, RN, who verbally acknowledged these results. Electronically Signed   By: Lowella Grip III M.D.   On: 12/28/2020 21:50   DG CHEST PORT 1 VIEW  Result Date: 12/28/2020 CLINICAL DATA:  Central line placement EXAM: PORTABLE CHEST 1 VIEW COMPARISON:  12/27/2020 FINDINGS: Right dialysis catheter in place with the tip at the cavoatrial junction. No pneumothorax. Cardiomegaly with vascular congestion. Right lower lobe atelectasis or infiltrate. Possible small layering right effusion.  No overt edema. No acute bony abnormality. IMPRESSION: Cardiomegaly, vascular congestion. Right lower lobe atelectasis or infiltrate with suspected small right effusion. Electronically Signed   By: Rolm Baptise M.D.   On: 12/28/2020 17:36   DG Abd Portable 1V  Result Date: 12/28/2020 CLINICAL DATA:  OG tube placement EXAM: PORTABLE ABDOMEN - 1 VIEW COMPARISON:  12/28/2020 FINDINGS: OG tube has been advanced with the tip in the right abdomen. This could be in the distal stomach or proximal duodenum. Prior cholecystectomy. IMPRESSION: Advancement of the NG tube into the distal stomach or proximal duodenum. Electronically Signed   By: Rolm Baptise M.D.   On: 12/28/2020 23:04   DG Abd Portable 1V  Result Date: 12/28/2020 CLINICAL DATA:  OG tube placement EXAM: PORTABLE ABDOMEN - 1 VIEW COMPARISON:  None. FINDINGS: OG tube tip is in the proximal stomach with the side port near the GE junction. Prior cholecystectomy. IMPRESSION: OG tube tip in the proximal stomach. Electronically Signed   By: Rolm Baptise M.D.   On: 12/28/2020 21:45    Labs: BMET Recent Labs  Lab 12/28/20 1641 12/28/20 2033 12/28/20 2236 12/28/20 2326 12/29/20 0217 12/29/20 0253 12/29/20 1133 12/29/20 1824 12/30/20 0420  NA 131*   < > 135 132* 135 135 134* 132* 129*  K 4.0   < > 3.8 3.8 3.5 3.6 3.7 3.3* 3.5  CL 100  --   --  102  --  100 99 96* 93*  CO2 12*  --   --  16*  --  16* 21* 24 28  GLUCOSE 133*  --   --  200*  --  185* 196* 173* 237*  BUN 45*  --   --  43*  --  35* 31* 26* 27*  CREATININE 5.97*  --   --  5.55*  --  4.66* 3.73* 3.24* 2.58*  CALCIUM 8.7*  --   --  8.2*  --  7.9* 7.9* 7.5* 7.0*  PHOS  --   --   --   --   --   --  3.7 3.0  3.1 2.4*   < > = values in this interval not displayed.   CBC Recent Labs  Lab 12/28/20 1641 12/28/20 2033 12/28/20 2326 12/29/20 0217 12/29/20 0253 12/30/20 0420  WBC 52.3*  --  77.5*  --  60.9* 44.0*  NEUTROABS  --   --   --   --   --  38.4*  HGB 11.4*   < > 11.6*  10.9* 11.1* 9.8*  HCT 32.7*   < > 34.5* 32.0* 31.1* 27.2*  MCV 90.6  --  94.3  --  90.4 87.5  PLT 195  --  253  --  156 149*   < > = values in this interval not displayed.    Medications:    . chlorhexidine gluconate (MEDLINE KIT)  15 mL Mouth Rinse BID  . Chlorhexidine Gluconate Cloth  6 each Topical Q0600  . docusate  100 mg Per Tube BID  . feeding supplement (PROSource TF)  45 mL Per Tube BID  . feeding supplement (VITAL HIGH PROTEIN)  1,000 mL Per Tube Q24H  . fentaNYL (SUBLIMAZE) injection  50 mcg Intravenous Once  . heparin  5,000 Units Subcutaneous Q8H  . insulin aspart  0-15 Units Subcutaneous Q4H  .  insulin aspart  5 Units Subcutaneous Q4H  . mouth rinse  15 mL Mouth Rinse 10 times per day  . polyethylene glycol  17 g Per Tube Daily  . sodium chloride flush  10-40 mL Intracatheter Q12H   Elmarie Shiley, MD 12/30/2020, 6:41 AM

## 2020-12-30 NOTE — Progress Notes (Signed)
2 Days Post-Op   Subjective/Chief Complaint: Remains on vent, CRRT   Objective: Vital signs in last 24 hours: Temp:  [96.5 F (35.8 C)-98.4 F (36.9 C)] 98 F (36.7 C) (06/05 0700) Pulse Rate:  [74-144] 87 (06/05 0645) Resp:  [12-32] 30 (06/05 0645) BP: (101-143)/(55-99) 107/63 (06/05 0500) SpO2:  [95 %-100 %] 98 % (06/05 0645) Arterial Line BP: (75-171)/(50-84) 132/65 (06/05 0645) FiO2 (%):  [30 %-40 %] 30 % (06/05 0400) Weight:  [103.7 kg] 103.7 kg (06/05 0304) Last BM Date:  (pta)  Intake/Output from previous day: 06/04 0701 - 06/05 0700 In: 5631.3 [I.V.:3580.1; NG/GT:901.3; IV Piggyback:1149.9] Out: 7160  Intake/Output this shift: No intake/output data recorded.  Exam: Intubated and sedated Dressing changed on large left buttock wound.  Necrotic fat, no purulence.  Some ischemia of lowe skin edge.  Lab Results:  Recent Labs    12/29/20 0253 12/30/20 0420  WBC 60.9* 44.0*  HGB 11.1* 9.8*  HCT 31.1* 27.2*  PLT 156 149*   BMET Recent Labs    12/29/20 1824 12/30/20 0420  NA 132* 129*  K 3.3* 3.5  CL 96* 93*  CO2 24 28  GLUCOSE 173* 237*  BUN 26* 27*  CREATININE 3.24* 2.58*  CALCIUM 7.5* 7.0*   PT/INR Recent Labs    12/28/20 1641 12/28/20 2326  LABPROT 16.4* 17.9*  INR 1.3* 1.5*   ABG Recent Labs    12/28/20 2236 12/29/20 0217  PHART 7.030* 7.333*  HCO3 16.8* 19.4*    Studies/Results: DG Chest Port 1 View  Result Date: 12/30/2020 CLINICAL DATA:  Respiratory failure EXAM: PORTABLE CHEST 1 VIEW COMPARISON:  12/28/2020 FINDINGS: Endotracheal tube and feeding tube unchanged. Central venous line unchanged. Stable cardiac silhouette. There is a moderate RIGHT effusion unchanged. Central venous congestion unchanged. IMPRESSION: 1. Stable support apparatus. 2. No change in RIGHT effusion and central venous congestion. Electronically Signed   By: Genevive Bi M.D.   On: 12/30/2020 07:32   DG CHEST PORT 1 VIEW  Result Date: 12/28/2020 CLINICAL  DATA:  ET tube adjusted EXAM: PORTABLE CHEST 1 VIEW COMPARISON:  12/28/2020 FINDINGS: Endotracheal tube has been retracted with the tip now approximately 4 cm above the carina. Right Central line and NG tube are unchanged. Increasing bilateral perihilar and right lower lobe airspace disease. Heart is borderline in size. IMPRESSION: Endotracheal tube has been retracted, now 4 cm above the carina. Other support devices stable. Increasing bilateral perihilar and right lower lobe airspace disease. Electronically Signed   By: Charlett Nose M.D.   On: 12/28/2020 22:11   DG CHEST PORT 1 VIEW  Result Date: 12/28/2020 CLINICAL DATA:  Hypoxia EXAM: PORTABLE CHEST 1 VIEW COMPARISON:  December 28, 2020 study obtained earlier in the day FINDINGS: Endotracheal tube tip is in the proximal right main bronchus. Nasogastric tube tip and side port are in the stomach. Central catheter tip is in the superior vena cava. No pneumothorax. There is mild left base atelectasis. No edema or airspace opacity. There is stable cardiomegaly with pulmonary vascularity normal. No adenopathy. No bone lesions. IMPRESSION: Tube and catheter positions as described without pneumothorax. The endotracheal tube tip is in the proximal right main bronchus. Advise withdrawing endotracheal tube approximately 4 cm. Mild left base atelectasis. No edema or consolidation. Stable cardiomegaly. Critical Value/emergent results were called by telephone at the time of interpretation on 12/28/2020 at 9:49 pm to provider Lincoln Brigham, RN, who verbally acknowledged these results. Electronically Signed   By: Bretta Bang III M.D.  On: 12/28/2020 21:50   DG CHEST PORT 1 VIEW  Result Date: 12/28/2020 CLINICAL DATA:  Central line placement EXAM: PORTABLE CHEST 1 VIEW COMPARISON:  12/27/2020 FINDINGS: Right dialysis catheter in place with the tip at the cavoatrial junction. No pneumothorax. Cardiomegaly with vascular congestion. Right lower lobe atelectasis or infiltrate.  Possible small layering right effusion. No overt edema. No acute bony abnormality. IMPRESSION: Cardiomegaly, vascular congestion. Right lower lobe atelectasis or infiltrate with suspected small right effusion. Electronically Signed   By: Charlett Nose M.D.   On: 12/28/2020 17:36   DG Abd Portable 1V  Result Date: 12/28/2020 CLINICAL DATA:  OG tube placement EXAM: PORTABLE ABDOMEN - 1 VIEW COMPARISON:  12/28/2020 FINDINGS: OG tube has been advanced with the tip in the right abdomen. This could be in the distal stomach or proximal duodenum. Prior cholecystectomy. IMPRESSION: Advancement of the NG tube into the distal stomach or proximal duodenum. Electronically Signed   By: Charlett Nose M.D.   On: 12/28/2020 23:04   DG Abd Portable 1V  Result Date: 12/28/2020 CLINICAL DATA:  OG tube placement EXAM: PORTABLE ABDOMEN - 1 VIEW COMPARISON:  None. FINDINGS: OG tube tip is in the proximal stomach with the side port near the GE junction. Prior cholecystectomy. IMPRESSION: OG tube tip in the proximal stomach. Electronically Signed   By: Charlett Nose M.D.   On: 12/28/2020 21:45    Anti-infectives: Anti-infectives (From admission, onward)   Start     Dose/Rate Route Frequency Ordered Stop   12/29/20 1400  clindamycin (CLEOCIN) IVPB 900 mg  Status:  Discontinued        900 mg 100 mL/hr over 30 Minutes Intravenous Every 8 hours 12/29/20 1214 12/29/20 1234   12/28/20 2200  linezolid (ZYVOX) IVPB 600 mg        600 mg 300 mL/hr over 60 Minutes Intravenous Every 12 hours 12/28/20 1620     12/28/20 1830  meropenem (MERREM) 1 g in sodium chloride 0.9 % 100 mL IVPB        1 g 200 mL/hr over 30 Minutes Intravenous Every 8 hours 12/28/20 1731     12/28/20 1800  meropenem (MERREM) 1 g in sodium chloride 0.9 % 100 mL IVPB  Status:  Discontinued        1 g 200 mL/hr over 30 Minutes Intravenous Every 24 hours 12/28/20 1700 12/28/20 1731   12/28/20 1730  meropenem (MERREM) 1 g in sodium chloride 0.9 % 100 mL IVPB   Status:  Discontinued        1 g 200 mL/hr over 30 Minutes Intravenous Every 8 hours 12/28/20 1639 12/28/20 1700      Assessment/Plan: s/p Procedure(s): DEBRIDEMENT LEFT BUTTOCK NECROTIZING SOFT TISSUE INFECTION - TOTAL TISSUE VOLUME 14CM X 12CM X 3CM DEEP (Left)  Start bid wet to dry dressing changed. May need hydrotherapy WBC trending down further Still may require a return to the OR for further debridement this week   LOS: 2 days    Abigail Miyamoto MD 12/30/2020

## 2020-12-30 NOTE — Progress Notes (Signed)
Notified Dr. Arlean Hopping the morning lab results. No new orders as of now. Will continue to monitor.

## 2020-12-30 NOTE — Progress Notes (Signed)
Initial Nutrition Assessment  DOCUMENTATION CODES:   Morbid obesity  INTERVENTION:   Continue tube feeding via OG tube: Change to Vital 1.5 at 50 ml/h (1200 ml per day) Prosource TF 45 ml QID  Provides 1960 kcal, 125 gm protein, 917 ml free water daily  B-complex with vitamin C while on CRRT  NUTRITION DIAGNOSIS:   Increased nutrient needs related to wound healing,acute illness (L buttock abscess; CRRT) as evidenced by estimated needs.  GOAL:   Patient will meet greater than or equal to 90% of their needs  MONITOR:   Vent status,TF tolerance,Labs,Skin  REASON FOR ASSESSMENT:   Ventilator,Consult Enteral/tube feeding initiation and management  ASSESSMENT:   50 yo female admitted from [place] with worsening renal function, necrotizing soft tissue infection of left buttock. PMH includes Bartholin cyst, HTN, DM-2, GERD.   S/P debridement of L buttock on 6/3 and 6/5. Received MD Consult for TF initiation and management. OG tube in place. Currently requiring CRRT, started 6/3. Received IVIg 6/4.  Patient is currently intubated on ventilator support MV: 11.7 L/min Temp (24hrs), Avg:98.1 F (36.7 C), Min:96.5 F (35.8 C), Max:98.4 F (36.9 C)    Labs reviewed. Na 129, Phos 2.4 CBG: 223-232  Medications reviewed and include vasopressin, levophed, potassium chloride, colace, miralax, novolog.  No weight history available for review.   I/O -735 ml since admission Anuric Receiving CRRT for volume removal.  NUTRITION - FOCUSED PHYSICAL EXAM:  unable to complete  Diet Order:   Diet Order            Diet NPO time specified  Diet effective now                 EDUCATION NEEDS:   Not appropriate for education at this time  Skin:  Skin Assessment: Skin Integrity Issues: Skin Integrity Issues:: Other (Comment) Other: L buttock soft tissue infection, S/P debridement x 2  Last BM:  no BM recorded  Height:   Ht Readings from Last 1 Encounters:   12/28/20 5\' 2"  (1.575 m)    Weight:   Wt Readings from Last 1 Encounters:  12/30/20 103.7 kg    Ideal Body Weight:  50 kg  BMI:  Body mass index is 41.81 kg/m.  Estimated Nutritional Needs:   Kcal:  1900  Protein:  115-125 gm  Fluid:  >/= 1.9 L    03/01/21, RD, LDN, CNSC Please refer to Amion for contact information.

## 2020-12-30 NOTE — Progress Notes (Signed)
[facility]  QTc prolonged Mg OK K 3.5 supp K Check trop Review medication list with pharmacy  Heber [place], MD [place] [facility] Pager: [phone] Cell: [phone] If no response, please call [phone] until 7pm After 7:00 pm call Elink  [phone]

## 2020-12-30 NOTE — Progress Notes (Signed)
Provided update to [place], patients sister, via phone. Sister is appreciative of the care she is receiving.

## 2020-12-31 ENCOUNTER — Encounter (HOSPITAL_COMMUNITY): Payer: Self-pay | Admitting: General Surgery

## 2020-12-31 ENCOUNTER — Inpatient Hospital Stay (HOSPITAL_COMMUNITY): Payer: Medicaid Other

## 2020-12-31 ENCOUNTER — Inpatient Hospital Stay (HOSPITAL_COMMUNITY): Payer: Medicaid Other | Admitting: Anesthesiology

## 2020-12-31 ENCOUNTER — Encounter (HOSPITAL_COMMUNITY)
Admission: AD | Disposition: A | Discharge status: Home Health | Payer: Self-pay | Source: Other Acute Inpatient Hospital | Attending: Pulmonary Disease

## 2020-12-31 DIAGNOSIS — M7989 Other specified soft tissue disorders: Secondary | ICD-10-CM | POA: Diagnosis not present

## 2020-12-31 DIAGNOSIS — N179 Acute kidney failure, unspecified: Secondary | ICD-10-CM | POA: Diagnosis not present

## 2020-12-31 DIAGNOSIS — J9602 Acute respiratory failure with hypercapnia: Secondary | ICD-10-CM | POA: Diagnosis not present

## 2020-12-31 DIAGNOSIS — A401 Sepsis due to streptococcus, group B: Secondary | ICD-10-CM | POA: Diagnosis not present

## 2020-12-31 DIAGNOSIS — I483 Typical atrial flutter: Secondary | ICD-10-CM

## 2020-12-31 HISTORY — PX: INCISION AND DRAINAGE ABSCESS: SHX5864

## 2020-12-31 LAB — COMPREHENSIVE METABOLIC PANEL
ALT: 19 U/L (ref 0–44)
AST: 41 U/L (ref 15–41)
Albumin: 1.1 g/dL — ABNORMAL LOW (ref 3.5–5.0)
Alkaline Phosphatase: 372 U/L — ABNORMAL HIGH (ref 38–126)
Anion gap: 8 (ref 5–15)
BUN: 27 mg/dL — ABNORMAL HIGH (ref 6–20)
CO2: 26 mmol/L (ref 22–32)
Calcium: 7.3 mg/dL — ABNORMAL LOW (ref 8.9–10.3)
Chloride: 97 mmol/L — ABNORMAL LOW (ref 98–111)
Creatinine, Ser: 1.92 mg/dL — ABNORMAL HIGH (ref 0.44–1.00)
GFR, Estimated: 32 mL/min — ABNORMAL LOW (ref >60)
Glucose, Bld: 268 mg/dL — ABNORMAL HIGH (ref 70–99)
Potassium: 3.6 mmol/L (ref 3.5–5.1)
Sodium: 131 mmol/L — ABNORMAL LOW (ref 135–145)
Total Bilirubin: 2.7 mg/dL — ABNORMAL HIGH (ref 0.3–1.2)
Total Protein: 8 g/dL (ref 6.5–8.1)

## 2020-12-31 LAB — CBC WITH DIFFERENTIAL/PLATELET
Abs Immature Granulocytes: 1.22 10*3/uL — ABNORMAL HIGH (ref 0.00–0.07)
Basophils Absolute: 0.1 10*3/uL (ref 0.0–0.1)
Basophils Relative: 0 %
Eosinophils Absolute: 0 10*3/uL (ref 0.0–0.5)
Eosinophils Relative: 0 %
HCT: 27.7 % — ABNORMAL LOW (ref 36.0–46.0)
Hemoglobin: 9.8 g/dL — ABNORMAL LOW (ref 12.0–15.0)
Lymphocytes Relative: 8 %
Lymphs Abs: 2.9 10*3/uL (ref 0.7–4.0)
MCH: 31.9 pg (ref 26.0–34.0)
MCHC: 35.4 g/dL (ref 30.0–36.0)
MCV: 90.2 fL (ref 80.0–100.0)
Monocytes Absolute: 0.9 10*3/uL (ref 0.1–1.0)
Monocytes Relative: 3 %
Neutro Abs: 29.9 10*3/uL — ABNORMAL HIGH (ref 1.7–7.7)
Neutrophils Relative %: 85 %
Platelets: 144 10*3/uL — ABNORMAL LOW (ref 150–400)
RBC: 3.07 MIL/uL — ABNORMAL LOW (ref 3.87–5.11)
RDW: 14.1 % (ref 11.5–15.5)
WBC: 35.1 10*3/uL — ABNORMAL HIGH (ref 4.0–10.5)
nRBC: 0 /100 WBC
nRBC: 0.7 % — ABNORMAL HIGH (ref 0.0–0.2)

## 2020-12-31 LAB — POCT I-STAT 7, (LYTES, BLD GAS, ICA,H+H)
Acid-Base Excess: 5 mmol/L — ABNORMAL HIGH (ref 0.0–2.0)
Bicarbonate: 28.2 mmol/L — ABNORMAL HIGH (ref 20.0–28.0)
Calcium, Ion: 1.07 mmol/L — ABNORMAL LOW (ref 1.15–1.40)
HCT: 29 % — ABNORMAL LOW (ref 36.0–46.0)
Hemoglobin: 9.9 g/dL — ABNORMAL LOW (ref 12.0–15.0)
O2 Saturation: 99 %
Potassium: 3.7 mmol/L (ref 3.5–5.1)
Sodium: 137 mmol/L (ref 135–145)
TCO2: 29 mmol/L (ref 22–32)
pCO2 arterial: 36.3 mmHg (ref 32.0–48.0)
pH, Arterial: 7.497 — ABNORMAL HIGH (ref 7.350–7.450)
pO2, Arterial: 144 mmHg — ABNORMAL HIGH (ref 83.0–108.0)

## 2020-12-31 LAB — GLUCOSE, CAPILLARY
Glucose-Capillary: 252 mg/dL — ABNORMAL HIGH (ref 70–99)
Glucose-Capillary: 200 mg/dL — ABNORMAL HIGH (ref 70–99)
Glucose-Capillary: 157 mg/dL — ABNORMAL HIGH (ref 70–99)
Glucose-Capillary: 171 mg/dL — ABNORMAL HIGH (ref 70–99)
Glucose-Capillary: 144 mg/dL — ABNORMAL HIGH (ref 70–99)
Glucose-Capillary: 160 mg/dL — ABNORMAL HIGH (ref 70–99)

## 2020-12-31 LAB — RENAL FUNCTION PANEL
Albumin: 1.1 g/dL — ABNORMAL LOW (ref 3.5–5.0)
Anion gap: 7 (ref 5–15)
BUN: 28 mg/dL — ABNORMAL HIGH (ref 6–20)
CO2: 23 mmol/L (ref 22–32)
Calcium: 7.3 mg/dL — ABNORMAL LOW (ref 8.9–10.3)
Chloride: 100 mmol/L (ref 98–111)
Creatinine, Ser: 1.83 mg/dL — ABNORMAL HIGH (ref 0.44–1.00)
GFR, Estimated: 34 mL/min — ABNORMAL LOW (ref >60)
Glucose, Bld: 173 mg/dL — ABNORMAL HIGH (ref 70–99)
Phosphorus: 3.7 mg/dL (ref 2.5–4.6)
Potassium: 3.8 mmol/L (ref 3.5–5.1)
Sodium: 130 mmol/L — ABNORMAL LOW (ref 135–145)

## 2020-12-31 LAB — TSH: TSH: 11.849 u[IU]/mL — ABNORMAL HIGH (ref 0.350–4.500)

## 2020-12-31 LAB — HEMOGLOBIN A1C
Hgb A1c MFr Bld: 11.1 % — ABNORMAL HIGH (ref 4.8–5.6)
Mean Plasma Glucose: 272 mg/dL

## 2020-12-31 LAB — MAGNESIUM: Magnesium: 2.3 mg/dL (ref 1.7–2.4)

## 2020-12-31 SURGERY — INCISION AND DRAINAGE, ABSCESS
Anesthesia: General | Site: Buttocks | Laterality: Left

## 2020-12-31 MED ORDER — PHENYLEPHRINE 40 MCG/ML (10ML) SYRINGE FOR IV PUSH (FOR BLOOD PRESSURE SUPPORT)
PREFILLED_SYRINGE | INTRAVENOUS | Status: DC | PRN
  Administered 2020-12-31 (×2): 80 ug via INTRAVENOUS

## 2020-12-31 MED ORDER — ROCURONIUM BROMIDE 10 MG/ML (PF) SYRINGE
PREFILLED_SYRINGE | INTRAVENOUS | Status: DC | PRN
  Administered 2020-12-31: 50 mg via INTRAVENOUS
  Administered 2020-12-31: 20 mg via INTRAVENOUS

## 2020-12-31 MED ORDER — MIDAZOLAM HCL 2 MG/2ML IJ SOLN
INTRAMUSCULAR | Status: DC | PRN
  Administered 2020-12-31: 2 mg via INTRAVENOUS

## 2020-12-31 MED ORDER — SODIUM CHLORIDE 0.9 % IR SOLN
Status: DC | PRN
  Administered 2020-12-31: 3000 mL

## 2020-12-31 MED ORDER — DAKINS (1/2 STRENGTH) 0.25 % EX SOLN
Freq: Once | CUTANEOUS | Status: AC
  Filled 2020-12-31: qty 473

## 2020-12-31 MED ORDER — PROPOFOL 10 MG/ML IV BOLUS
INTRAVENOUS | Status: AC
  Filled 2020-12-31: qty 20

## 2020-12-31 MED ORDER — PIPERACILLIN-TAZOBACTAM 3.375 G IVPB
3.3750 g | Freq: Four times a day (QID) | INTRAVENOUS | Status: DC
  Administered 2020-12-31 – 2021-01-01 (×4): 3.375 g via INTRAVENOUS
  Filled 2020-12-31 (×5): qty 50

## 2020-12-31 MED ORDER — INSULIN DETEMIR 100 UNIT/ML ~~LOC~~ SOLN
20.0000 [IU] | Freq: Two times a day (BID) | SUBCUTANEOUS | Status: DC
  Administered 2020-12-31 – 2021-01-02 (×6): 20 [IU] via SUBCUTANEOUS
  Filled 2020-12-31 (×8): qty 0.2

## 2020-12-31 MED ORDER — POTASSIUM PHOSPHATES 15 MMOLE/5ML IV SOLN
30.0000 mmol | Freq: Once | INTRAVENOUS | Status: AC
  Administered 2020-12-31: 30 mmol via INTRAVENOUS
  Filled 2020-12-31: qty 10

## 2020-12-31 MED ORDER — AMIODARONE LOAD VIA INFUSION
150.0000 mg | Freq: Once | INTRAVENOUS | Status: AC
  Administered 2020-12-31: 150 mg via INTRAVENOUS
  Filled 2020-12-31: qty 83.34

## 2020-12-31 MED ORDER — INSULIN ASPART 100 UNIT/ML IJ SOLN
0.0000 [IU] | INTRAMUSCULAR | Status: DC
  Administered 2020-12-31: 3 [IU] via SUBCUTANEOUS
  Administered 2020-12-31 – 2021-01-01 (×5): 4 [IU] via SUBCUTANEOUS
  Administered 2021-01-01: 3 [IU] via SUBCUTANEOUS
  Administered 2021-01-01: 4 [IU] via SUBCUTANEOUS
  Administered 2021-01-01: 7 [IU] via SUBCUTANEOUS
  Administered 2021-01-01 – 2021-01-02 (×2): 4 [IU] via SUBCUTANEOUS
  Administered 2021-01-02 (×2): 3 [IU] via SUBCUTANEOUS
  Administered 2021-01-02 (×3): 4 [IU] via SUBCUTANEOUS
  Administered 2021-01-03: 3 [IU] via SUBCUTANEOUS
  Administered 2021-01-04: 4 [IU] via SUBCUTANEOUS
  Administered 2021-01-04: 3 [IU] via SUBCUTANEOUS
  Administered 2021-01-04 (×2): 4 [IU] via SUBCUTANEOUS
  Administered 2021-01-04: 3 [IU] via SUBCUTANEOUS
  Administered 2021-01-04: 7 [IU] via SUBCUTANEOUS
  Administered 2021-01-04: 4 [IU] via SUBCUTANEOUS
  Administered 2021-01-05: 7 [IU] via SUBCUTANEOUS
  Administered 2021-01-05 (×2): 4 [IU] via SUBCUTANEOUS
  Administered 2021-01-05 (×3): 7 [IU] via SUBCUTANEOUS
  Administered 2021-01-06 (×2): 4 [IU] via SUBCUTANEOUS
  Administered 2021-01-06 (×2): 3 [IU] via SUBCUTANEOUS
  Administered 2021-01-07 – 2021-01-08 (×4): 4 [IU] via SUBCUTANEOUS
  Administered 2021-01-08: 7 [IU] via SUBCUTANEOUS
  Administered 2021-01-08: 4 [IU] via SUBCUTANEOUS
  Administered 2021-01-08: 7 [IU] via SUBCUTANEOUS
  Administered 2021-01-08: 11 [IU] via SUBCUTANEOUS
  Administered 2021-01-08: 7 [IU] via SUBCUTANEOUS
  Administered 2021-01-09: 4 [IU] via SUBCUTANEOUS
  Administered 2021-01-09: 3 [IU] via SUBCUTANEOUS
  Administered 2021-01-09 (×3): 7 [IU] via SUBCUTANEOUS
  Administered 2021-01-09 – 2021-01-10 (×5): 4 [IU] via SUBCUTANEOUS
  Administered 2021-01-10: 3 [IU] via SUBCUTANEOUS
  Administered 2021-01-11 (×4): 4 [IU] via SUBCUTANEOUS
  Administered 2021-01-11: 3 [IU] via SUBCUTANEOUS
  Administered 2021-01-11 – 2021-01-12 (×3): 4 [IU] via SUBCUTANEOUS
  Administered 2021-01-12: 3 [IU] via SUBCUTANEOUS
  Administered 2021-01-12: 4 [IU] via SUBCUTANEOUS

## 2020-12-31 MED ORDER — LIDOCAINE HCL (PF) 2 % IJ SOLN
INTRAMUSCULAR | Status: AC
  Filled 2020-12-31: qty 10

## 2020-12-31 MED ORDER — 0.9 % SODIUM CHLORIDE (POUR BTL) OPTIME
TOPICAL | Status: DC | PRN
  Administered 2020-12-31: 1000 mL

## 2020-12-31 MED ORDER — INSULIN ASPART 100 UNIT/ML IJ SOLN
5.0000 [IU] | INTRAMUSCULAR | Status: DC
  Administered 2020-12-31 – 2021-01-02 (×16): 5 [IU] via SUBCUTANEOUS

## 2020-12-31 MED ORDER — FENTANYL CITRATE (PF) 250 MCG/5ML IJ SOLN
INTRAMUSCULAR | Status: AC
  Filled 2020-12-31: qty 5

## 2020-12-31 MED ORDER — FENTANYL CITRATE (PF) 250 MCG/5ML IJ SOLN
INTRAMUSCULAR | Status: DC | PRN
  Administered 2020-12-31: 150 ug via INTRAVENOUS
  Administered 2020-12-31: 100 ug via INTRAVENOUS

## 2020-12-31 MED ORDER — ROCURONIUM BROMIDE 10 MG/ML (PF) SYRINGE
PREFILLED_SYRINGE | INTRAVENOUS | Status: AC
  Filled 2020-12-31: qty 30

## 2020-12-31 MED ORDER — AMIODARONE HCL IN DEXTROSE 360-4.14 MG/200ML-% IV SOLN
30.0000 mg/h | INTRAVENOUS | Status: DC
  Administered 2021-01-01 – 2021-01-04 (×9): 30 mg/h via INTRAVENOUS
  Filled 2020-12-31 (×8): qty 200

## 2020-12-31 MED ORDER — AMIODARONE HCL IN DEXTROSE 360-4.14 MG/200ML-% IV SOLN
60.0000 mg/h | INTRAVENOUS | Status: AC
  Administered 2020-12-31 (×2): 60 mg/h via INTRAVENOUS
  Filled 2020-12-31: qty 200

## 2020-12-31 MED ORDER — DAKINS (1/2 STRENGTH) 0.25 % EX SOLN
CUTANEOUS | Status: DC | PRN
  Administered 2020-12-31: 1 via TOPICAL

## 2020-12-31 MED ORDER — MIDAZOLAM HCL 2 MG/2ML IJ SOLN
INTRAMUSCULAR | Status: AC
  Filled 2020-12-31: qty 2

## 2020-12-31 SURGICAL SUPPLY — 28 items
BNDG GAUZE ELAST 4 BULKY (GAUZE/BANDAGES/DRESSINGS) ×3 IMPLANT
CANISTER SUCT 3000ML PPV (MISCELLANEOUS) ×3 IMPLANT
COVER SURGICAL LIGHT HANDLE (MISCELLANEOUS) ×3 IMPLANT
COVER WAND RF STERILE (DRAPES) ×3 IMPLANT
DRAPE LAPAROSCOPIC ABDOMINAL (DRAPES) ×3 IMPLANT
DRSG PAD ABDOMINAL 8X10 ST (GAUZE/BANDAGES/DRESSINGS) ×6 IMPLANT
ELECT CAUTERY BLADE 6.4 (BLADE) IMPLANT
ELECT REM PT RETURN 9FT ADLT (ELECTROSURGICAL) ×3
ELECTRODE REM PT RTRN 9FT ADLT (ELECTROSURGICAL) ×1 IMPLANT
GAUZE SPONGE 4X4 12PLY STRL (GAUZE/BANDAGES/DRESSINGS) IMPLANT
GLOVE BIO SURGEON STRL SZ7.5 (GLOVE) ×3 IMPLANT
GLOVE SRG 8 PF TXTR STRL LF DI (GLOVE) ×1 IMPLANT
GLOVE SURG UNDER POLY LF SZ8 (GLOVE) ×2
GOWN STRL REUS W/ TWL LRG LVL3 (GOWN DISPOSABLE) ×1 IMPLANT
GOWN STRL REUS W/TWL LRG LVL3 (GOWN DISPOSABLE) ×2
HANDPIECE INTERPULSE COAX TIP (DISPOSABLE) ×2
KIT BASIN OR (CUSTOM PROCEDURE TRAY) ×3 IMPLANT
KIT TURNOVER KIT B (KITS) ×3 IMPLANT
NS IRRIG 1000ML POUR BTL (IV SOLUTION) ×3 IMPLANT
PACK GENERAL/GYN (CUSTOM PROCEDURE TRAY) ×3 IMPLANT
PAD ARMBOARD 7.5X6 YLW CONV (MISCELLANEOUS) ×3 IMPLANT
PENCIL SMOKE EVACUATOR (MISCELLANEOUS) ×3 IMPLANT
SET HNDPC FAN SPRY TIP SCT (DISPOSABLE) ×1 IMPLANT
SPONGE LAP 18X18 X RAY DECT (DISPOSABLE) ×9 IMPLANT
SWAB COLLECTION DEVICE MRSA (MISCELLANEOUS) IMPLANT
SWAB CULTURE ESWAB REG 1ML (MISCELLANEOUS) IMPLANT
TOWEL GREEN STERILE (TOWEL DISPOSABLE) ×3 IMPLANT
TOWEL GREEN STERILE FF (TOWEL DISPOSABLE) ×3 IMPLANT

## 2020-12-31 NOTE — Progress Notes (Signed)
Attending:    Subjective: Blood pressures soft overnight Tachycardia overnight Remains on levophed Vasopressin off Intermittent periods of agitation Remains on mechanical ventilation  Objective: Vitals:   12/31/20 0702 12/31/20 0745 12/31/20 0800 12/31/20 0815  BP: 93/60  117/84   Pulse: (!) 130 (!) 130 (!) 127 (!) 131  Resp: (!) 21 (!) 30 15 (!) 30  Temp:      TempSrc:      SpO2: 100% 100% 100% 100%  Weight:      Height:       Vent Mode: PRVC FiO2 (%):  [30 %] 30 % Set Rate:  [30 bmp] 30 bmp Vt Set:  [400 mL] 400 mL PEEP:  [5 cmH20] 5 cmH20 Plateau Pressure:  [14 cmH20-18 cmH20] 14 cmH20  Intake/Output Summary (Last 24 hours) at 12/31/2020 0904 Last data filed at 12/31/2020 0700 Gross per 24 hour  Intake 4019.45 ml  Output 5928 ml  Net -1908.55 ml    General:  In bed on vent HENT: NCAT ETT in place PULM: CTA B, vent supported breathing CV: RRR, no mgr GI: BS+, soft, nontender MSK: normal bulk and tone Derm: see below Neuro: sedated on vent      CBC    Component Value Date/Time   WBC 35.1 (H) 12/31/2020 0330   RBC 3.07 (L) 12/31/2020 0330   HGB 9.9 (L) 12/31/2020 0753   HCT 29.0 (L) 12/31/2020 0753   PLT 144 (L) 12/31/2020 0330   MCV 90.2 12/31/2020 0330   MCH 31.9 12/31/2020 0330   MCHC 35.4 12/31/2020 0330   RDW 14.1 12/31/2020 0330   LYMPHSABS 2.9 12/31/2020 0330   MONOABS 0.9 12/31/2020 0330   EOSABS 0.0 12/31/2020 0330   BASOSABS 0.1 12/31/2020 0330    BMET    Component Value Date/Time   NA 137 12/31/2020 0753   K 3.7 12/31/2020 0753   CL 97 (L) 12/31/2020 0330   CO2 26 12/31/2020 0330   GLUCOSE 268 (H) 12/31/2020 0330   BUN 27 (H) 12/31/2020 0330   CREATININE 1.92 (H) 12/31/2020 0330   CALCIUM 7.3 (L) 12/31/2020 0330   GFRNONAA 32 (L) 12/31/2020 0330    CXR images reviewed, ETT, HD cath in place, OG in place  Impression/Plan:  QTc prolongation> Keep K > 4, supplement today Hypophosphatemia> KPhos supp today Necrotizing buttock  infection> back to OR today for debridement; change from meropenem to zosyn, continue linezolid AKI> continue CRRT monitor UOP Septic > wean off levophed for MAP > 65 Acute respiratory failure with hypoxemia> maintain intubated today for OR, wean to extubate post op Sedation >  Hyperglycemia with DM2 > reduce tube feeding coverage, add levemir, increase sliding scale to resistant Atrial flutter> cardiology consult given QTc prolongation on   My cc time 40 minutes  Heber Dana, MD [facility] Pager: [phone] Cell: [phone] After 3pm or if no response, call [phone]

## 2020-12-31 NOTE — Progress Notes (Signed)
[facility] Progress Note  3 Days Post-Op  Subjective: CC:  Intubated, sedated.  CCM Weaning pressors, on very low doses of levophed On CRRT  TF at 50 cc/hr -- held during my exam at 0900  WBC 35 from 44   Objective: Vital signs in last 24 hours: Temp:  [97.5 F (36.4 C)-98.3 F (36.8 C)] 98.3 F (36.8 C) (06/06 0340) Pulse Rate:  [108-131] 131 (06/06 0815) Resp:  [0-35] 30 (06/06 0815) BP: (83-130)/(49-84) 117/84 (06/06 0800) SpO2:  [96 %-100 %] 100 % (06/06 0815) Arterial Line BP: (93-176)/(47-91) 109/57 (06/06 0815) FiO2 (%):  [30 %] 30 % (06/06 0742) Weight:  [102.2 kg] 102.2 kg (06/06 0334) Last BM Date: (S) 12/31/20  Intake/Output from previous day: 06/05 0701 - 06/06 0700 In: 4241 [I.V.:1913.3; NG/GT:1427.5; IV Piggyback:900.2] Out: 6363  Intake/Output this shift: No intake/output data recorded.  PE: Gen: intubated, sedated  Card:  Sinus tachycardia  Abd: Soft, obese Skin: left buttock  Wound roughly  14 x 10 x 3 cm, base >80% fibrinous exudate. There is fat necrosis around the wound perimeter and frankly necrotic skin of the superior and medial aspect of the wound.   Psych - unable to assess, sedated.   Lab Results:  Recent Labs    12/30/20 0420 12/31/20 0330 12/31/20 0753  WBC 44.0* 35.1*  --   HGB 9.8* 9.8* 9.9*  HCT 27.2* 27.7* 29.0*  PLT 149* 144*  --    BMET Recent Labs    12/30/20 1601 12/31/20 0330 12/31/20 0753  NA 130* 131* 137  K 3.8 3.6 3.7  CL 94* 97*  --   CO2 27 26  --   GLUCOSE 236* 268*  --   BUN 26* 27*  --   CREATININE 2.16* 1.92*  --   CALCIUM 7.4* 7.3*  --    PT/INR Recent Labs    12/28/20 1641 12/28/20 2326  LABPROT 16.4* 17.9*  INR 1.3* 1.5*   CMP     Component Value Date/Time   NA 137 12/31/2020 0753   K 3.7 12/31/2020 0753   CL 97 (L) 12/31/2020 0330   CO2 26 12/31/2020 0330   GLUCOSE 268 (H) 12/31/2020 0330   BUN 27 (H) 12/31/2020 0330   CREATININE 1.92 (H) 12/31/2020 0330   CALCIUM  7.3 (L) 12/31/2020 0330   PROT 8.0 12/31/2020 0330   ALBUMIN 1.1 (L) 12/31/2020 0330   AST 41 12/31/2020 0330   ALT 19 12/31/2020 0330   ALKPHOS 372 (H) 12/31/2020 0330   BILITOT 2.7 (H) 12/31/2020 0330   GFRNONAA 32 (L) 12/31/2020 0330   Lipase  No results found for: LIPASE     Studies/Results: DG Chest Port 1 View  Result Date: 12/31/2020 CLINICAL DATA:  Hypoxia EXAM: PORTABLE CHEST 1 VIEW COMPARISON:  December 30, 2020 FINDINGS: Endotracheal tube tip is 7.5 cm above carina. Nasogastric tube tip and side port are below the diaphragm. Central catheter tip is in the superior vena cava. No pneumothorax. No edema or airspace opacity. There is cardiomegaly with pulmonary vascularity normal. No adenopathy. No bone lesions. IMPRESSION: Tube and catheter positions as described without pneumothorax. No edema or airspace opacity. Stable cardiac enlargement. Electronically Signed   By: Bretta Bang III M.D.   On: 12/31/2020 07:50   DG Chest Port 1 View  Result Date: 12/30/2020 CLINICAL DATA:  Respiratory failure EXAM: PORTABLE CHEST 1 VIEW COMPARISON:  12/28/2020 FINDINGS: Endotracheal tube and feeding tube unchanged. Central venous line unchanged. Stable cardiac silhouette.  There is a moderate RIGHT effusion unchanged. Central venous congestion unchanged. IMPRESSION: 1. Stable support apparatus. 2. No change in RIGHT effusion and central venous congestion. Electronically Signed   By: Genevive Bi M.D.   On: 12/30/2020 07:32    Anti-infectives: Anti-infectives (From admission, onward)   Start     Dose/Rate Route Frequency Ordered Stop   12/29/20 1400  clindamycin (CLEOCIN) IVPB 900 mg  Status:  Discontinued        900 mg 100 mL/hr over 30 Minutes Intravenous Every 8 hours 12/29/20 1214 12/29/20 1234   12/28/20 2200  linezolid (ZYVOX) IVPB 600 mg        600 mg 300 mL/hr over 60 Minutes Intravenous Every 12 hours 12/28/20 1620     12/28/20 1830  meropenem (MERREM) 1 g in sodium chloride 0.9  % 100 mL IVPB        1 g 200 mL/hr over 30 Minutes Intravenous Every 8 hours 12/28/20 1731     12/28/20 1800  meropenem (MERREM) 1 g in sodium chloride 0.9 % 100 mL IVPB  Status:  Discontinued        1 g 200 mL/hr over 30 Minutes Intravenous Every 24 hours 12/28/20 1700 12/28/20 1731   12/28/20 1730  meropenem (MERREM) 1 g in sodium chloride 0.9 % 100 mL IVPB  Status:  Discontinued        1 g 200 mL/hr over 30 Minutes Intravenous Every 8 hours 12/28/20 1639 12/28/20 1700     Assessment/Plan Necrotizing soft tissue infection left buttock  S/P DEBRIDEMENT LEFT BUTTOCK NECROTIZING SOFT TISSUE INFECTION - TOTAL TISSUE VOLUME 14CM X 12CM X 3CM DEEP 12/28/2020  Dr. Violeta Gelinas POD#3 - afebrile, WBC downtrending  - intra-op cultures with GNR - wound with ongoing necrosis, I think she needs to go back to the OR for further debridement today vs tomorrow AM.  - TF held today at 0900.  - continue IV abx (linezolid)   Below per CCM:  VDRF Septic shock Renal failure  DM2 Obesity   FEN: NPO, IVF, TF previously at 50 cc/hr and tolerating, having bowel function.  ID: outside hospital Cx with GBS, intra-op cultures pending (GNR); merrem, linezolid 6/3 >> abx per ID VTE: SCD's, SQH  Foley: in place for strict I&O's, placed 6/3 Dispo: ICU, recommend return to OR for repeat I&D    LOS: 3 days    Hosie Spangle, [facility] Please see Amion for pager number during day hours 7:00am-4:30pm

## 2020-12-31 NOTE — Op Note (Signed)
12/31/2020  3:31 PM  PATIENT:  [name] [name]  50 y.o. female  PRE-OPERATIVE DIAGNOSIS:  Necrotizing Fasciitis  POST-OPERATIVE DIAGNOSIS:  Necrotizing Fasciitis 16x15x3cm wound  PROCEDURE:  Procedure(s): INCISION AND DRAINAGE ABSCESS BUTTOCK (Left)  SURGEON:  Surgeon(s) and Role:    Axel Filler, MD - Primary  PHYSICIAN ASSISTANT: Bailey Mech, PA-C  ASSISTANTS: none   ANESTHESIA:   general  EBL:  10 mL   BLOOD ADMINISTERED:none  DRAINS: none   LOCAL MEDICATIONS USED:  NONE  SPECIMEN:  No Specimen  DISPOSITION OF SPECIMEN:  N/A  COUNTS:  YES  TOURNIQUET:  * No tourniquets in log *  DICTATION: .Dragon Dictation  PLAN OF CARE: Admit to inpatient  Indication procedure: Patient is a 50 year old female, with a left gluteal soft tissue infection.  Patient had continued ischemia of the wound.  Patient was taken back to the operating room urgently for further debridement.  Findings: Patient had a 16 x 15 x 3 cm wound at the end of debridement.  There was some necrotic debridement of soft tissue that was seen.  Details of procedure: After the patient consented patient was taken back to the OR and placed in the prone position.  Patient was placed under general anesthesia.  Patient was then prepped and draped in standard fashion.  A timeout was called and all facts verified.  Patient had some easily seen necrotic tissue.  This was debrided sharply.  Pulse lavage was used to debride the soft tissue that was seen to be necrotic.  The areas circumferentially were debrided back to healthy bleeding tissue.  Some areas of fat necrosis were debrided sharply.  At this time a pull/was used to irrigate the entire area.  At this time half Dakin's solution was used to soaked Kerlix.  This was packed to the wound.  The wound was then dressed with ABD pad and tape.  Patient taught procedure well.  Patient is taken back to the ICU intubated and in stable condition.  Excisional  debridement:  1.  Progress note or procedure note with a detailed description of the procedure.  2.  Tool used for debridement (curette, scapel, etc.)  Scapel, curette, cautery  3.  Frequency of surgical debridement.   2nd debridement  4.  Measurement of total devitalized tissue (wound surface) before and after surgical debridement.   8x8cm  5.  Area and depth of devitalized tissue removed from wound.  16x15x3cm  6.  Blood loss and description of tissue removed.  50cc  7.  Evidence of the progress of the wound's response to treatment.  A.  Current wound volume (current dimensions and depth).  16x15x3cm  B.  Presence (and extent of) of infection.  yes  C.  Presence (and extent of) of non viable tissue.  yes  D.  Other material in the wound that is expected to inhibit healing.  non  8.  Was there any viable tissue removed (measurements): none    PATIENT DISPOSITION:  ICU- hemodynamically stable.   Delay start of Pharmacological VTE agent (>24hrs) due to surgical blood loss or risk of bleeding: not applicable

## 2020-12-31 NOTE — Anesthesia Procedure Notes (Addendum)
Date/Time: 12/31/2020 2:45 PM Performed by: Marena Chancy, CRNA Pre-anesthesia Checklist: Patient identified, Emergency Drugs available, Suction available, Patient being monitored and Timeout performed Patient Re-evaluated:Patient Re-evaluated prior to induction Oxygen Delivery Method: Circle system utilized Induction Type: Inhalational induction Placement Confirmation: positive ETCO2 and breath sounds checked- equal and bilateral

## 2020-12-31 NOTE — Anesthesia Postprocedure Evaluation (Signed)
Anesthesia Post Note  Patient: Redina Zeller  Procedure(s) Performed: INCISION AND DRAINAGE ABSCESS BUTTOCK (Left Buttocks)     Patient location during evaluation: SICU Anesthesia Type: General Level of consciousness: sedated and patient remains intubated per anesthesia plan Pain management: pain level controlled Vital Signs Assessment: post-procedure vital signs reviewed and stable Respiratory status: patient remains intubated per anesthesia plan Cardiovascular status: stable Postop Assessment: no apparent nausea or vomiting Anesthetic complications: no   No complications documented.  Last Vitals:  Vitals:   12/31/20 1300 12/31/20 1400  BP: 91/68 (!) 93/57  Pulse: (!) 133 (!) 129  Resp: (!) 28 (!) 29  Temp: 36.7 C   SpO2: 100% 100%    Last Pain:  Vitals:   12/31/20 0930  TempSrc: Axillary  PainSc:                  Lucretia Kern

## 2020-12-31 NOTE — Anesthesia Preprocedure Evaluation (Signed)
Anesthesia Evaluation  Patient identified by MRN, date of birth, ID bandGeneral Assessment Comment:Intubated, sedated  Reviewed: Allergy & Precautions, NPO status , Patient's Chart, lab work & pertinent test results  History of Anesthesia Complications Negative for: history of anesthetic complications  Airway Mallampati: Intubated       Dental   Pulmonary Current Smoker,       + intubated    Cardiovascular + dysrhythmias Atrial Fibrillation  Rhythm:Regular Rate:Tachycardia     Neuro/Psych negative neurological ROS     GI/Hepatic Neg liver ROS, GERD  ,  Endo/Other  diabetes, Poorly Controlled, Type obesity  Renal/GU ARF and DialysisRenal disease  negative genitourinary   Musculoskeletal negative musculoskeletal ROS (+)   Abdominal   Peds  Hematology  (+) anemia ,   Anesthesia Other Findings  Admitted to ICU with necrotizing fasciitis of buttock and associated septic shock, acute respiratory failure requiring mechanical ventilation, and AKI on CRRT.  Reproductive/Obstetrics                           Anesthesia Physical Anesthesia Plan  ASA: IV  Anesthesia Plan: General   Post-op Pain Management:    Induction: Intravenous and Inhalational  PONV Risk Score and Plan: 2 and Treatment may vary due to age or medical condition and Midazolam  Airway Management Planned: Oral ETT  Additional Equipment:   Intra-op Plan:   Post-operative Plan: Post-operative intubation/ventilation  Informed Consent: I have reviewed the patients History and Physical, chart, labs and discussed the procedure including the risks, benefits and alternatives for the proposed anesthesia with the patient or authorized representative who has indicated his/her understanding and acceptance.     Consent reviewed with POA  Plan Discussed with:   Anesthesia Plan Comments:        Anesthesia Quick  Evaluation

## 2020-12-31 NOTE — Transfer of Care (Signed)
Immediate Anesthesia Transfer of Care Note  Patient: [name] [name]  Procedure(s) Performed: INCISION AND DRAINAGE ABSCESS BUTTOCK (Left Buttocks)  Patient Location: ICU  Anesthesia Type:General  Level of Consciousness: sedated and unresponsive  Airway & Oxygen Therapy: Patient remains intubated per anesthesia plan and Patient placed on Ventilator (see vital sign flow sheet for setting)  Post-op Assessment: Report given to RN and Post -op Vital signs reviewed and stable  Post vital signs: Reviewed and stable  Last Vitals:  Vitals Value Taken Time  BP    Temp    Pulse 125 12/31/20 1555  Resp 30 12/31/20 1557  SpO2 100 % 12/31/20 1555  Vitals shown include unvalidated device data.  Last Pain:  Vitals:   12/31/20 0930  TempSrc: Axillary  PainSc:          Complications: No complications documented.

## 2020-12-31 NOTE — Consult Note (Signed)
Cardiology Consultation:   Patient ID: [name] [name] MRN: [id number]; DOB: September 14, 1970  Admit date: 12/28/2020 Date of Consult: 12/31/2020  PCP:  [name] [name] No   [facility] Providers Cardiologist:  [name] Latch, [name] new  Patient Profile:   [name] [name] is a 50 y.o. female with a PMH significant for DM not on medications after weight loss, GERD, HLD, HTN, anxiety/depression, obesity,and previous COVID infection (2020) who is being seen 12/31/2020 for the evaluation of prolonged QT on EKG at the request of [name] [name].  History of Present Illness:   [name] [name] saw GYN on 12/19/20 for vulvar irritation following an injury that resulted on periods of immobility. She unfortunately presented to [facility] on 12/23/20 with cellulitis on left buttock. I&D grew GBS. CT imaging with [place] gas but no abscess. She was transferred to [facility] for further management. Prior to transport, she was found hypotensive and oliguric with encephalopathy. Norepi was started and she was admitted to [facility]. She was transferred on 12/28/20 and treated for septic shock. ID and nephrology consulted. She complained of buttock pain. Surgery was consulted and performed debridement of left buttock necrotizing soft tissue infection. EKG on 12/28/20 appears to have some p waves, but unreliable and irregular - suspect Afib with controlled ventricular response. She was started on CRRT. She remains intubated. EKG on 12/29/20 with atrial flutter with RVR. She will be taken back to the OR today for debridement of necrotizing buttock infection.   EKG with atrial flutter and ventricular rate 114.   Pt remains intubated. Level 5 caveat.   Past Medical History:  Diagnosis Date  . Depression   . DM (diabetes mellitus) ([place])   . GERD (gastroesophageal reflux disease)   . HLD (hyperlipidemia)   . Obesity   . Tobacco abuse     Past Surgical History:  Procedure Laterality Date  . ABCESS DRAINAGE  11/2020   bartholin gland cyst  .  IRRIGATION AND DEBRIDEMENT BUTTOCKS Left 12/28/2020   Procedure: DEBRIDEMENT LEFT BUTTOCK NECROTIZING SOFT TISSUE INFECTION - TOTAL TISSUE VOLUME 14CM X 12CM X 3CM DEEP;  Surgeon: [name] [name];  Location: [facility];  Service: General;  Laterality: Left;     Home Medications:  Prior to Admission medications   Medication Sig Start Date End Date Taking? Authorizing Provider  amphetamine-dextroamphetamine (ADDERALL) 20 MG tablet Take 20 mg by mouth 2 (two) times daily. 11/27/20  Yes Provider, Historical, [name]  DULoxetine (CYMBALTA) 60 MG capsule Take 60 mg by mouth daily. 12/23/20  Yes Provider, Historical, [name]  esomeprazole (NEXIUM) 40 MG capsule Take 40 mg by mouth daily. 12/08/20  Yes Provider, Historical, [name]  meloxicam (MOBIC) 15 MG tablet Take 15 mg by mouth daily. 12/23/20  Yes Provider, Historical, [name]  ACCU-CHEK AVIVA PLUS test strip 1 each by Other route 3 (three) times daily. 10/26/20   Provider, Historical, [name]  ALPRAZolam Duanne Moron) 1 MG tablet Take 1 mg by mouth 2 (two) times daily as needed for anxiety or sleep. 11/27/20   Provider, Historical, [name]  amoxicillin-clavulanate (AUGMENTIN) 875-125 MG tablet Take 1 tablet by mouth 2 (two) times daily. 11/07/20   Provider, Historical, [name]  cephALEXin (KEFLEX) 500 MG capsule Take 500 mg by mouth 3 (three) times daily. 12/25/20   Provider, Historical, [name]  clotrimazole-betamethasone (LOTRISONE) cream Apply topically 2 (two) times daily. 12/19/20   Provider, Historical, [name]  fluconazole (DIFLUCAN) 150 MG tablet Take 150 mg by mouth once. 12/21/20   Provider, Historical, [name]  glimepiride (AMARYL) 4 MG tablet Take 4  mg by mouth 2 (two) times daily. 08/27/20   Provider, Historical, [name]  lisinopril-hydrochlorothiazide (ZESTORETIC) 10-12.5 MG tablet Take 1 tablet by mouth daily. 08/28/20   Provider, Historical, [name]  meclizine (ANTIVERT) 25 MG tablet Take 25 mg by mouth 2 (two) times daily as needed. 11/27/20   Provider, Historical, [name]  metroNIDAZOLE (FLAGYL) 500 MG tablet Take 500  mg by mouth 2 (two) times daily. 09/28/20   Provider, Historical, [name]  Oxycodone HCl 10 MG TABS Take 10 mg by mouth 3 (three) times daily as needed (pain). 11/27/20   Provider, Historical, [name]  potassium chloride (KLOR-CON) 10 MEQ tablet Take 10 mEq by mouth 2 (two) times daily. 08/27/20   Provider, Historical, [name]  predniSONE (DELTASONE) 20 MG tablet Take 20 mg by mouth 2 (two) times daily. 12/03/20   Provider, Historical, [name]  pregabalin (LYRICA) 100 MG capsule Take 100 mg by mouth 3 (three) times daily. 12/03/20   Provider, Historical, [name]  promethazine (PHENERGAN) 25 MG tablet Take 25 mg by mouth every 6 (six) hours as needed for nausea or vomiting. 11/02/20   Provider, Historical, [name]  tobramycin (TOBREX) 0.3 % ophthalmic solution Place 1 drop into the right eye every 4 (four) hours. 11/07/20   Provider, Historical, [name]  traMADol (ULTRAM) 50 MG tablet Take 50 mg by mouth 4 (four) times daily as needed for moderate pain. 12/03/20   Provider, Historical, [name]    Inpatient Medications: Scheduled Meds: . B-complex with vitamin C  1 tablet Per Tube Daily  . chlorhexidine gluconate (MEDLINE KIT)  15 mL Mouth Rinse BID  . Chlorhexidine Gluconate Cloth  6 each Topical Q0600  . docusate  100 mg Per Tube BID  . feeding supplement (PROSource TF)  45 mL Per Tube QID  . heparin  5,000 Units Subcutaneous Q8H  . insulin aspart  0-20 Units Subcutaneous Q4H  . insulin aspart  5 Units Subcutaneous Q4H  . insulin detemir  20 Units Subcutaneous BID  . mouth rinse  15 mL Mouth Rinse 10 times per day  . oxyCODONE  10 mg Per Tube Q8H  . pantoprazole sodium  40 mg Per Tube Daily  . polyethylene glycol  17 g Per Tube Daily  . sodium chloride flush  10-40 mL Intracatheter Q12H   Continuous Infusions: .  prismasol BGK 4/2.5 400 mL/hr at 12/31/20 0057  .  prismasol BGK 4/2.5 400 mL/hr at 12/31/20 0800  . sodium chloride Stopped (12/28/20 1851)  . amiodarone 60 mg/hr (12/31/20 1300)   Followed by  . amiodarone    . feeding  supplement (VITAL 1.5 CAL) Stopped (12/31/20 1100)  . fentaNYL infusion INTRAVENOUS 400 mcg/hr (12/31/20 1300)  . Immune Globulin 10% Stopped (12/30/20 2000)  . linezolid (ZYVOX) IV Stopped (12/31/20 1118)  . norepinephrine (LEVOPHED) Adult infusion Stopped (12/31/20 1141)  . piperacillin-tazobactam (ZOSYN)  IV    . potassium PHOSPHATE IVPB (in mmol) 85 mL/hr at 12/31/20 1300  . prismasol BGK 4/2.5 1,500 mL/hr at 12/31/20 0930   PRN Meds: fentaNYL, heparin, heparin, midazolam, ondansetron (ZOFRAN) IV, polyethylene glycol, sodium chloride flush  Allergies:   No Known Allergies  Social History:   Social History   Socioeconomic History  . Marital status: Widowed    Spouse name: Not on file  . Number of children: Not on file  . Years of education: Not on file  . Highest education level: Not on file  Occupational History  . Not on file  Tobacco Use  . Smoking status: Current Every  Day Smoker  . Smokeless tobacco: Never Used  Substance and Sexual Activity  . Alcohol use: Not on file  . Drug use: Not on file  . Sexual activity: Not on file  Other Topics Concern  . Not on file  Social History Narrative  . Not on file   Social Determinants of Health   Financial Resource Strain: Not on file  Food Insecurity: Not on file  Transportation Needs: Not on file  Physical Activity: Not on file  Stress: Not on file  Social Connections: Not on file  Intimate Partner Violence: Not on file    Family History:   History reviewed. No pertinent family history.  Unable to fully collect due to level 5 caveat - pt intubated  ROS:  Please see the history of present illness.   All other ROS reviewed and negative.     Physical Exam/Data:   Vitals:   12/31/20 1115 12/31/20 1200 12/31/20 1300 12/31/20 1400  BP:  92/65 91/68 (!) 93/57  Pulse: (!) 132 (!) 134 (!) 133 (!) 129  Resp: (!) 22 (!) 27 (!) 28 (!) 29  Temp:   98 F (36.7 C)   TempSrc:      SpO2: 100% 100% 100% 100%  Weight:       Height:        Intake/Output Summary (Last 24 hours) at 12/31/2020 1413 Last data filed at 12/31/2020 1300 Gross per 24 hour  Intake 4017.75 ml  Output 6082 ml  Net -2064.25 ml   Last 3 Weights 12/31/2020 12/30/2020 12/29/2020  Weight (lbs) 225 lb 5 oz 228 lb 9.9 oz 230 lb 2.6 oz  Weight (kg) 102.2 kg 103.7 kg 104.4 kg     Body mass index is 41.21 kg/m.  General:  Obese female, intubated and sedated Cardiac:  Regular rhythm, tachycardic rate, no murmur Lungs:  intubated Ext: mild LE edema Musculoskeletal:  In mittens  Skin: warm and dry  Neuro:  Intubated and sedated Psych:  sedated  EKG:  The EKG was personally reviewed and demonstrates: atrial flutter with ventricular rate  114 with QTcB 595 Telemetry:  Telemetry was personally reviewed and demonstrates:  Atrial flutter with RVR in the 130s  Relevant CV Studies:  none  Laboratory Data:  High Sensitivity Troponin:   Recent Labs  Lab 12/30/20 1023  TROPONINIHS 88*     Chemistry Recent Labs  Lab 12/30/20 0420 12/30/20 1601 12/31/20 0330 12/31/20 0753  NA 129* 130* 131* 137  K 3.5 3.8 3.6 3.7  CL 93* 94* 97*  --   CO2 28 27 26   --   GLUCOSE 237* 236* 268*  --   BUN 27* 26* 27*  --   CREATININE 2.58* 2.16* 1.92*  --   CALCIUM 7.0* 7.4* 7.3*  --   GFRNONAA 22* 28* 32*  --   ANIONGAP 8 9 8   --     Recent Labs  Lab 12/29/20 0253 12/29/20 1824 12/30/20 0420 12/30/20 1601 12/31/20 0330  PROT 5.4*  --  7.3  --  8.0  ALBUMIN 1.4*   < > 1.1* 1.2* 1.1*  AST 35  --  29  --  41  ALT 19  --  14  --  19  ALKPHOS 353*  --  299*  --  372*  BILITOT 6.0*  --  3.8*  --  2.7*   < > = values in this interval not displayed.   Hematology Recent Labs  Lab 12/29/20 0253 12/30/20 0420 12/31/20  0330 12/31/20 0753  WBC 60.9* 44.0* 35.1*  --   RBC 3.44* 3.11* 3.07*  --   HGB 11.1* 9.8* 9.8* 9.9*  HCT 31.1* 27.2* 27.7* 29.0*  MCV 90.4 87.5 90.2  --   MCH 32.3 31.5 31.9  --   MCHC 35.7 36.0 35.4  --   RDW 13.5 13.2 14.1   --   PLT 156 149* 144*  --    BNP Recent Labs  Lab 12/28/20 1641  BNP 1,323.6*    DDimer No results for input(s): DDIMER in the last 168 hours.   Radiology/Studies:  DG Chest Port 1 View  Result Date: 12/31/2020 CLINICAL DATA:  Hypoxia EXAM: PORTABLE CHEST 1 VIEW COMPARISON:  December 30, 2020 FINDINGS: Endotracheal tube tip is 7.5 cm above carina. Nasogastric tube tip and side port are below the diaphragm. Central catheter tip is in the superior vena cava. No pneumothorax. No edema or airspace opacity. There is cardiomegaly with pulmonary vascularity normal. No adenopathy. No bone lesions. IMPRESSION: Tube and catheter positions as described without pneumothorax. No edema or airspace opacity. Stable cardiac enlargement. Electronically Signed   By: Lowella Grip III M.D.   On: 12/31/2020 07:50   DG Chest Port 1 View  Result Date: 12/30/2020 CLINICAL DATA:  Respiratory failure EXAM: PORTABLE CHEST 1 VIEW COMPARISON:  12/28/2020 FINDINGS: Endotracheal tube and feeding tube unchanged. Central venous line unchanged. Stable cardiac silhouette. There is a moderate RIGHT effusion unchanged. Central venous congestion unchanged. IMPRESSION: 1. Stable support apparatus. 2. No change in RIGHT effusion and central venous congestion. Electronically Signed   By: Suzy Bouchard M.D.   On: 12/30/2020 07:32   DG CHEST PORT 1 VIEW  Result Date: 12/28/2020 CLINICAL DATA:  ET tube adjusted EXAM: PORTABLE CHEST 1 VIEW COMPARISON:  12/28/2020 FINDINGS: Endotracheal tube has been retracted with the tip now approximately 4 cm above the carina. Right Central line and NG tube are unchanged. Increasing bilateral perihilar and right lower lobe airspace disease. Heart is borderline in size. IMPRESSION: Endotracheal tube has been retracted, now 4 cm above the carina. Other support devices stable. Increasing bilateral perihilar and right lower lobe airspace disease. Electronically Signed   By: Rolm Baptise M.D.   On:  12/28/2020 22:11   DG CHEST PORT 1 VIEW  Result Date: 12/28/2020 CLINICAL DATA:  Hypoxia EXAM: PORTABLE CHEST 1 VIEW COMPARISON:  December 28, 2020 study obtained earlier in the day FINDINGS: Endotracheal tube tip is in the proximal right main bronchus. Nasogastric tube tip and side port are in the stomach. Central catheter tip is in the superior vena cava. No pneumothorax. There is mild left base atelectasis. No edema or airspace opacity. There is stable cardiomegaly with pulmonary vascularity normal. No adenopathy. No bone lesions. IMPRESSION: Tube and catheter positions as described without pneumothorax. The endotracheal tube tip is in the proximal right main bronchus. Advise withdrawing endotracheal tube approximately 4 cm. Mild left base atelectasis. No edema or consolidation. Stable cardiomegaly. Critical Value/emergent results were called by telephone at the time of interpretation on 12/28/2020 at 9:49 pm to provider Sandford Craze, RN, who verbally acknowledged these results. Electronically Signed   By: Lowella Grip III M.D.   On: 12/28/2020 21:50   DG CHEST PORT 1 VIEW  Result Date: 12/28/2020 CLINICAL DATA:  Central line placement EXAM: PORTABLE CHEST 1 VIEW COMPARISON:  12/27/2020 FINDINGS: Right dialysis catheter in place with the tip at the cavoatrial junction. No pneumothorax. Cardiomegaly with vascular congestion. Right lower lobe atelectasis or  infiltrate. Possible small layering right effusion. No overt edema. No acute bony abnormality. IMPRESSION: Cardiomegaly, vascular congestion. Right lower lobe atelectasis or infiltrate with suspected small right effusion. Electronically Signed   By: Rolm Baptise M.D.   On: 12/28/2020 17:36   DG Abd Portable 1V  Result Date: 12/28/2020 CLINICAL DATA:  OG tube placement EXAM: PORTABLE ABDOMEN - 1 VIEW COMPARISON:  12/28/2020 FINDINGS: OG tube has been advanced with the tip in the right abdomen. This could be in the distal stomach or proximal duodenum. Prior  cholecystectomy. IMPRESSION: Advancement of the NG tube into the distal stomach or proximal duodenum. Electronically Signed   By: Rolm Baptise M.D.   On: 12/28/2020 23:04   DG Abd Portable 1V  Result Date: 12/28/2020 CLINICAL DATA:  OG tube placement EXAM: PORTABLE ABDOMEN - 1 VIEW COMPARISON:  None. FINDINGS: OG tube tip is in the proximal stomach with the side port near the GE junction. Prior cholecystectomy. IMPRESSION: OG tube tip in the proximal stomach. Electronically Signed   By: Rolm Baptise M.D.   On: 12/28/2020 21:45     Assessment and Plan:   Atrial flutter / fib with RVR - pt converted from fib to flutter - rates in the 130s - suspect this is in response to her infection and shock, hopefully  will improve as infection is controlled - BNP on admission was 1300 in renal failure, on CRRT - unable to evaluate for symptoms, consider echocardiogram - flutter may be difficult to rate control -agree with amiodarone gtt - given AAT and flutter for at least 2 days, consider anticoagulate given stroke risk   Prolonged QTcB read on EKG - EKG on arrival with scattered p waves, but not regular - no QT prolongation - pt converted to atrial flutter with an EKG read of prolonged QTc - unable to calculate due to flutter waves - Mg 1.6 --> improved to 2.3 with supplementation - K 3.6 from nadir of 3.3   Septic shock - intubated AKI - on CRRT Necrotizing buttock infection - ABX - per PCCM, nephrology, and ID - lactic acid trended up to 5.1, WBC trending down to 52 from 77   DM with hyperglycemia - A1c 11.1% - on insulin   Hypertension - home lisinopril-HCTZ    Risk Assessment/Risk Scores:   [id number]-QMGN Score = 3  {Click here to calculate score.  This indicates a 3.2% annual risk of stroke. The patient's score is based upon: CHF History: No HTN History: Yes Diabetes History: Yes Stroke History: No Vascular Disease History: No Age Score: 0 Gender Score: 1       For  questions or updates, please contact [place] Please consult [email or web address] for contact info under    Signed, Ledora Bottcher, PA  12/31/2020 2:13 PM

## 2020-12-31 NOTE — Progress Notes (Signed)
[facility] for Infectious Disease  Date of Admission:  12/28/2020           Reason for visit: Follow up on necrotizing soft tissue infection  Current antibiotics: Linezolid 6/3--present Meropenem 6/3--present  ASSESSMENT:    Necrotizing skin and soft tissue infection of the left buttock/posterior thigh: Status postdebridement 12/28/2020 with intraoperative cultures growing group B strep as well as gram-negative rods yet to be identified.  I&D cultures performed at outside hospital also with GBS and 1 out of 4 blood culture bottles positive for GPC's in and anaerobic bottle not yet identified. Septic shock: Secondary to #1 with improved hemodynamics Severe leukocytosis: WBC improved to 35.1 today Acute kidney injury: Remains on CRRT Ventilatory dependent respiratory failure Diabetes, obesity  PLAN:    Based on her culture results will narrow from meropenem to piperacillin tazobactam Continue linezolid which also offers antitoxin effect.  Interestingly her GBS from outside hospital was resistant to clindamycin as well Follow-up cultures Continue IVIG in the setting of streptococcal TSS Likely to need further debridement in the OR Wound care, glycemic control Pressors and ventilatory support per CCM   Principal Problem:   Necrotizing soft tissue infection Active Problems:   Septic shock ([place])   Sepsis due to Streptococcus, group B ([place])   Acute kidney injury ([place])   Obesity   Type 2 diabetes mellitus with hyperglycemia ([place])    MEDICATIONS:    Scheduled Meds: . B-complex with vitamin C  1 tablet Per Tube Daily  . chlorhexidine gluconate (MEDLINE KIT)  15 mL Mouth Rinse BID  . Chlorhexidine Gluconate Cloth  6 each Topical Q0600  . docusate  100 mg Per Tube BID  . feeding supplement (PROSource TF)  45 mL Per Tube QID  . heparin  5,000 Units Subcutaneous Q8H  . insulin aspart  0-20 Units Subcutaneous Q4H  . insulin aspart  5 Units Subcutaneous Q4H  . insulin  detemir  20 Units Subcutaneous BID  . mouth rinse  15 mL Mouth Rinse 10 times per day  . oxyCODONE  10 mg Per Tube Q8H  . pantoprazole sodium  40 mg Per Tube Daily  . polyethylene glycol  17 g Per Tube Daily  . sodium chloride flush  10-40 mL Intracatheter Q12H   Continuous Infusions: .  prismasol BGK 4/2.5 400 mL/hr at 12/31/20 0057  .  prismasol BGK 4/2.5 400 mL/hr at 12/30/20 1925  . sodium chloride Stopped (12/28/20 1851)  . feeding supplement (VITAL 1.5 CAL) 1,000 mL (12/30/20 1639)  . fentaNYL infusion INTRAVENOUS 400 mcg/hr (12/31/20 0939)  . Immune Globulin 10% Stopped (12/30/20 2000)  . linezolid (ZYVOX) IV Stopped (12/30/20 2337)  . norepinephrine (LEVOPHED) Adult infusion 2 mcg/min (12/31/20 0900)  . piperacillin-tazobactam (ZOSYN)  IV    . potassium PHOSPHATE IVPB (in mmol)    . prismasol BGK 4/2.5 1,500 mL/hr at 12/31/20 0615   PRN Meds:.fentaNYL, heparin, heparin, midazolam, ondansetron (ZOFRAN) IV, polyethylene glycol, sodium chloride flush  SUBJECTIVE:   24 hour events:  No acute events Afebrile  Improved WBC Remains on CRRT Improved hemodynamics Discussed with surgery and PCCM    Review of Systems  Unable to perform ROS: Intubated      OBJECTIVE:   Blood pressure 117/84, pulse (!) 131, temperature 98.3 F (36.8 C), temperature source Axillary, resp. rate (!) 30, height _0  (1.575 m), weight 102.2 kg, SpO2 100 %. Body mass index is 41.21 kg/m.  Physical Exam Constitutional:  Comments: Ill appearing, grimacing on vent, not following commands.   HENT:     Head: Normocephalic and atraumatic.  Eyes:     Comments: Eyes open.   Neck:     Comments: ET tube in place. Pulmonary:     Comments: Ventilated breath sounds coarse bilaterally.  Abdominal:     Palpations: Abdomen is soft.     Tenderness: There is no abdominal tenderness. There is no guarding or rebound.  Musculoskeletal:     Cervical back: Normal range of motion and neck supple.   Neurological:     Comments: Not following commands.       Lab Results: Lab Results  Component Value Date   WBC 35.1 (H) 12/31/2020   HGB 9.9 (L) 12/31/2020   HCT 29.0 (L) 12/31/2020   MCV 90.2 12/31/2020   PLT 144 (L) 12/31/2020    Lab Results  Component Value Date   NA 137 12/31/2020   K 3.7 12/31/2020   CO2 26 12/31/2020   GLUCOSE 268 (H) 12/31/2020   BUN 27 (H) 12/31/2020   CREATININE 1.92 (H) 12/31/2020   CALCIUM 7.3 (L) 12/31/2020   GFRNONAA 32 (L) 12/31/2020    Lab Results  Component Value Date   ALT 19 12/31/2020   AST 41 12/31/2020   ALKPHOS 372 (H) 12/31/2020   BILITOT 2.7 (H) 12/31/2020    No results found for: CRP  No results found for: ESRSEDRATE   I have reviewed the micro and lab results in Epic.  Imaging: DG Chest Port 1 View  Result Date: 12/31/2020 CLINICAL DATA:  Hypoxia EXAM: PORTABLE CHEST 1 VIEW COMPARISON:  December 30, 2020 FINDINGS: Endotracheal tube tip is 7.5 cm above carina. Nasogastric tube tip and side port are below the diaphragm. Central catheter tip is in the superior vena cava. No pneumothorax. No edema or airspace opacity. There is cardiomegaly with pulmonary vascularity normal. No adenopathy. No bone lesions. IMPRESSION: Tube and catheter positions as described without pneumothorax. No edema or airspace opacity. Stable cardiac enlargement. Electronically Signed   By: Lowella Grip III M.D.   On: 12/31/2020 07:50   DG Chest Port 1 View  Result Date: 12/30/2020 CLINICAL DATA:  Respiratory failure EXAM: PORTABLE CHEST 1 VIEW COMPARISON:  12/28/2020 FINDINGS: Endotracheal tube and feeding tube unchanged. Central venous line unchanged. Stable cardiac silhouette. There is a moderate RIGHT effusion unchanged. Central venous congestion unchanged. IMPRESSION: 1. Stable support apparatus. 2. No change in RIGHT effusion and central venous congestion. Electronically Signed   By: Suzy Bouchard M.D.   On: 12/30/2020 07:32     Imaging   independently reviewed in Epic.    Raynelle [facility] for Infectious Disease [facility] [phone] pager 12/31/2020, 10:04 AM

## 2020-12-31 NOTE — Progress Notes (Signed)
Patient ID: [name] [name], female   DOB: 08-10-1970, 50 y.o.   MRN: [id number] S: No events overnight. O:BP 103/70   Pulse (!) 132   Temp 99 F (37.2 C) (Axillary)   Resp (!) 24   Ht _0  (1.575 m)   Wt 102.2 kg   SpO2 100%   BMI 41.21 kg/m   Intake/Output Summary (Last 24 hours) at 12/31/2020 1059 Last data filed at 12/31/2020 1000 Gross per 24 hour  Intake 4086.52 ml  Output 6375 ml  Net -2288.48 ml   Intake/Output: I/O last 3 completed shifts: In: 6991.3 [I.V.:3423.7; OY/DX:4128.7; IV Piggyback:1600.1] Out: 86767 [Other:10244]  Intake/Output this shift:  Total I/O In: 288.8 [I.V.:138.8; NG/GT:150] Out: 672 [Other:672] Weight change: -1.5 kg Gen: intubated and sedated CVS: tachy at 132 Resp: CTA Abd: +BS, soft Ext: no edema  Recent Labs  Lab 12/28/20 1641 12/28/20 2033 12/28/20 2326 12/29/20 0217 12/29/20 0253 12/29/20 1133 12/29/20 1824 12/30/20 0420 12/30/20 1601 12/31/20 0330 12/31/20 0753  NA 131*   < > 132*   < > 135 134* 132* 129* 130* 131* 137  K 4.0   < > 3.8   < > 3.6 3.7 3.3* 3.5 3.8 3.6 3.7  CL 100  --  102  --  100 99 96* 93* 94* 97*  --   CO2 12*  --  16*  --  16* 21* _1 --   GLUCOSE 133*  --  200*  --  185* 196* 173* 237* 236* 268*  --   BUN 45*  --  43*  --  35* 31* 26* 27* 26* 27*  --   CREATININE 5.97*  --  5.55*  --  4.66* 3.73* 3.24* 2.58* 2.16* 1.92*  --   ALBUMIN 1.6*  --  1.6*  --  1.4*  --  1.2* 1.1* 1.2* 1.1*  --   CALCIUM 8.7*  --  8.2*  --  7.9* 7.9* 7.5* 7.0* 7.4* 7.3*  --   PHOS  --   --   --   --   --  3.7 3.0  3.1 2.4* 1.9*  1.9*  --   --   AST 40  --  40  --  35  --   --  29  --  41  --   ALT 19  --  20  --  19  --   --  14  --  19  --    < > = values in this interval not displayed.   Liver Function Tests: Recent Labs  Lab 12/29/20 0253 12/29/20 1824 12/30/20 0420 12/30/20 1601 12/31/20 0330  AST 35  --  29  --  41  ALT 19  --  14  --  19  ALKPHOS 353*  --  299*  --  372*  BILITOT 6.0*  --  3.8*  --   2.7*  PROT 5.4*  --  7.3  --  8.0  ALBUMIN 1.4*   < > 1.1* 1.2* 1.1*   < > = values in this interval not displayed.   No results for input(s): LIPASE, AMYLASE in the last 168 hours. No results for input(s): AMMONIA in the last 168 hours. CBC: Recent Labs  Lab 12/28/20 1641 12/28/20 2033 12/28/20 2326 12/29/20 0217 12/29/20 0253 12/30/20 0420 12/31/20 0330 12/31/20 0753  WBC 52.3*  --  77.5*  --  60.9* 44.0* 35.1*  --   NEUTROABS  --   --   --   --   --  38.4* 29.9*  --   HGB 11.4*   < > 11.6*   < > 11.1* 9.8* 9.8* 9.9*  HCT 32.7*   < > 34.5*   < > 31.1* 27.2* 27.7* 29.0*  MCV 90.6  --  94.3  --  90.4 87.5 90.2  --   PLT 195  --  253  --  156 149* 144*  --    < > = values in this interval not displayed.   Cardiac Enzymes: No results for input(s): CKTOTAL, CKMB, CKMBINDEX, TROPONINI in the last 168 hours. CBG: Recent Labs  Lab 12/30/20 1122 12/30/20 1558 12/30/20 1913 12/30/20 2315 12/31/20 0333  GLUCAP 218* 216* 230* 225* 252*    Iron Studies: No results for input(s): IRON, TIBC, TRANSFERRIN, FERRITIN in the last 72 hours. Studies/Results: DG Chest Port 1 View  Result Date: 12/31/2020 CLINICAL DATA:  Hypoxia EXAM: PORTABLE CHEST 1 VIEW COMPARISON:  December 30, 2020 FINDINGS: Endotracheal tube tip is 7.5 cm above carina. Nasogastric tube tip and side port are below the diaphragm. Central catheter tip is in the superior vena cava. No pneumothorax. No edema or airspace opacity. There is cardiomegaly with pulmonary vascularity normal. No adenopathy. No bone lesions. IMPRESSION: Tube and catheter positions as described without pneumothorax. No edema or airspace opacity. Stable cardiac enlargement. Electronically Signed   By: Lowella Grip III M.D.   On: 12/31/2020 07:50   DG Chest Port 1 View  Result Date: 12/30/2020 CLINICAL DATA:  Respiratory failure EXAM: PORTABLE CHEST 1 VIEW COMPARISON:  12/28/2020 FINDINGS: Endotracheal tube and feeding tube unchanged. Central venous line  unchanged. Stable cardiac silhouette. There is a moderate RIGHT effusion unchanged. Central venous congestion unchanged. IMPRESSION: 1. Stable support apparatus. 2. No change in RIGHT effusion and central venous congestion. Electronically Signed   By: Suzy Bouchard M.D.   On: 12/30/2020 07:32   . B-complex with vitamin C  1 tablet Per Tube Daily  . chlorhexidine gluconate (MEDLINE KIT)  15 mL Mouth Rinse BID  . Chlorhexidine Gluconate Cloth  6 each Topical Q0600  . docusate  100 mg Per Tube BID  . feeding supplement (PROSource TF)  45 mL Per Tube QID  . heparin  5,000 Units Subcutaneous Q8H  . insulin aspart  0-20 Units Subcutaneous Q4H  . insulin aspart  5 Units Subcutaneous Q4H  . insulin detemir  20 Units Subcutaneous BID  . mouth rinse  15 mL Mouth Rinse 10 times per day  . oxyCODONE  10 mg Per Tube Q8H  . pantoprazole sodium  40 mg Per Tube Daily  . polyethylene glycol  17 g Per Tube Daily  . sodium chloride flush  10-40 mL Intracatheter Q12H    BMET    Component Value Date/Time   NA 137 12/31/2020 0753   K 3.7 12/31/2020 0753   CL 97 (L) 12/31/2020 0330   CO2 26 12/31/2020 0330   GLUCOSE 268 (H) 12/31/2020 0330   BUN 27 (H) 12/31/2020 0330   CREATININE 1.92 (H) 12/31/2020 0330   CALCIUM 7.3 (L) 12/31/2020 0330   GFRNONAA 32 (L) 12/31/2020 0330   CBC    Component Value Date/Time   WBC 35.1 (H) 12/31/2020 0330   RBC 3.07 (L) 12/31/2020 0330   HGB 9.9 (L) 12/31/2020 0753   HCT 29.0 (L) 12/31/2020 0753   PLT 144 (L) 12/31/2020 0330   MCV 90.2 12/31/2020 0330   MCH 31.9 12/31/2020 0330   MCHC 35.4 12/31/2020 0330   RDW 14.1 12/31/2020 0330  LYMPHSABS 2.9 12/31/2020 0330   MONOABS 0.9 12/31/2020 0330   EOSABS 0.0 12/31/2020 0330   BASOSABS 0.1 12/31/2020 0330     Assessment/Plan:  1. AKI, oliguric/anuric - presumably ischemic ATN in setting of severe sepsis syndrome and hypotension requiring multiple pressors.  She was started on CRRT 12/28/20 vir RIJ HD catheter  placed by PCCM.  Remains anuric and on CRRT.   1. All fluids 4K/2.5Ca, pre and post filter fluids at 400 ml/hr, dialysate at 1500 ml/hr, no anticoagulation. 2. Keep even. 2. 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 - currently on levophed, s/p debridement on 12/28/20. 3. VDRF - per PCCM 4. DM type 2 - per primary 5. Acute metabolic encephalopathy 6. Anemia of critical illness - transfuse prn.  7. Hypophosphatemia - replete IV and follow.  Donetta Potts, MD [employer] [phone]

## 2021-01-01 ENCOUNTER — Encounter (HOSPITAL_COMMUNITY): Payer: Self-pay | Admitting: General Surgery

## 2021-01-01 DIAGNOSIS — I483 Typical atrial flutter: Secondary | ICD-10-CM

## 2021-01-01 LAB — RENAL FUNCTION PANEL
Albumin: <1 g/dL — ABNORMAL LOW (ref 3.5–5.0)
Albumin: 1 g/dL — ABNORMAL LOW (ref 3.5–5.0)
Anion gap: 8 (ref 5–15)
Anion gap: 5 (ref 5–15)
BUN: 26 mg/dL — ABNORMAL HIGH (ref 6–20)
BUN: 23 mg/dL — ABNORMAL HIGH (ref 6–20)
CO2: 22 mmol/L (ref 22–32)
CO2: 24 mmol/L (ref 22–32)
Calcium: 6.9 mg/dL — ABNORMAL LOW (ref 8.9–10.3)
Calcium: 7.2 mg/dL — ABNORMAL LOW (ref 8.9–10.3)
Chloride: 98 mmol/L (ref 98–111)
Chloride: 99 mmol/L (ref 98–111)
Creatinine, Ser: 1.62 mg/dL — ABNORMAL HIGH (ref 0.44–1.00)
Creatinine, Ser: 1.46 mg/dL — ABNORMAL HIGH (ref 0.44–1.00)
GFR, Estimated: 39 mL/min — ABNORMAL LOW (ref >60)
GFR, Estimated: 44 mL/min — ABNORMAL LOW (ref >60)
Glucose, Bld: 212 mg/dL — ABNORMAL HIGH (ref 70–99)
Glucose, Bld: 163 mg/dL — ABNORMAL HIGH (ref 70–99)
Phosphorus: 3.6 mg/dL (ref 2.5–4.6)
Phosphorus: 3.3 mg/dL (ref 2.5–4.6)
Potassium: 3.9 mmol/L (ref 3.5–5.1)
Potassium: 4.2 mmol/L (ref 3.5–5.1)
Sodium: 128 mmol/L — ABNORMAL LOW (ref 135–145)
Sodium: 128 mmol/L — ABNORMAL LOW (ref 135–145)

## 2021-01-01 LAB — AEROBIC/ANAEROBIC CULTURE W GRAM STAIN (SURGICAL/DEEP WOUND)

## 2021-01-01 LAB — T4, FREE: Free T4: 0.85 ng/dL (ref 0.61–1.12)

## 2021-01-01 LAB — GLUCOSE, CAPILLARY
Glucose-Capillary: 164 mg/dL — ABNORMAL HIGH (ref 70–99)
Glucose-Capillary: 167 mg/dL — ABNORMAL HIGH (ref 70–99)
Glucose-Capillary: 162 mg/dL — ABNORMAL HIGH (ref 70–99)
Glucose-Capillary: 146 mg/dL — ABNORMAL HIGH (ref 70–99)
Glucose-Capillary: 155 mg/dL — ABNORMAL HIGH (ref 70–99)
Glucose-Capillary: 214 mg/dL — ABNORMAL HIGH (ref 70–99)

## 2021-01-01 LAB — MAGNESIUM: Magnesium: 2.1 mg/dL (ref 1.7–2.4)

## 2021-01-01 MED ORDER — NON FORMULARY: 1.0000 | Freq: Two times a day (BID) | Status: DC

## 2021-01-01 MED ORDER — PIPERACILLIN-TAZOBACTAM 3.375 G IVPB
3.3750 g | Freq: Four times a day (QID) | INTRAVENOUS | Status: DC
  Administered 2021-01-01 – 2021-01-03 (×7): 3.375 g via INTRAVENOUS
  Filled 2021-01-01 (×8): qty 50

## 2021-01-01 MED ORDER — SODIUM CHLORIDE 0.9 % IV SOLN
0.5000 mg/kg/h | INTRAVENOUS | Status: DC
  Administered 2021-01-01 (×3): 1 mg/kg/h via INTRAVENOUS
  Administered 2021-01-02 (×4): 1.5 mg/kg/h via INTRAVENOUS
  Administered 2021-01-02: 1 mg/kg/h via INTRAVENOUS
  Administered 2021-01-03 – 2021-01-04 (×9): 1.5 mg/kg/h via INTRAVENOUS
  Filled 2021-01-01 (×22): qty 5

## 2021-01-01 MED ORDER — DAKINS (FULL STRENGTH) SOLUTION 0.5%
Freq: Two times a day (BID) | CUTANEOUS | Status: DC
  Filled 2021-01-01 (×3): qty 1000

## 2021-01-01 MED ORDER — POTASSIUM CHLORIDE 20 MEQ PO PACK
20.0000 meq | PACK | Freq: Once | ORAL | Status: AC
  Administered 2021-01-01: 20 meq
  Filled 2021-01-01: qty 1

## 2021-01-01 NOTE — Progress Notes (Signed)
Progress Note  Patient Name: [name] [name] Date of Encounter: 01/01/2021  [facility] Cardiologist: Skeet Latch, MD 9new)  Subjective   Unable to assess.  Intubated and sedated.  Inpatient Medications    Scheduled Meds: . B-complex with vitamin C  1 tablet Per Tube Daily  . chlorhexidine gluconate (MEDLINE KIT)  15 mL Mouth Rinse BID  . Chlorhexidine Gluconate Cloth  6 each Topical Q0600  . docusate  100 mg Per Tube BID  . feeding supplement (PROSource TF)  45 mL Per Tube QID  . heparin  5,000 Units Subcutaneous Q8H  . insulin aspart  0-20 Units Subcutaneous Q4H  . insulin aspart  5 Units Subcutaneous Q4H  . insulin detemir  20 Units Subcutaneous BID  . mouth rinse  15 mL Mouth Rinse 10 times per day  . oxyCODONE  10 mg Per Tube Q8H  . pantoprazole sodium  40 mg Per Tube Daily  . polyethylene glycol  17 g Per Tube Daily  . sodium chloride flush  10-40 mL Intracatheter Q12H  . sodium hypochlorite   Irrigation BID   Continuous Infusions: .  prismasol BGK 4/2.5 400 mL/hr at 01/01/21 0513  .  prismasol BGK 4/2.5 400 mL/hr at 12/31/20 2335  . sodium chloride 10 mL/hr at 01/01/21 0800  . amiodarone 30 mg/hr (01/01/21 0800)  . feeding supplement (VITAL 1.5 CAL) 1,000 mL (12/31/20 1647)  . fentaNYL infusion INTRAVENOUS 250 mcg/hr (01/01/21 0800)  . linezolid (ZYVOX) IV Stopped (12/31/20 2320)  . norepinephrine (LEVOPHED) Adult infusion 5 mcg/min (01/01/21 0800)  . piperacillin-tazobactam (ZOSYN)  IV 12.5 mL/hr at 01/01/21 0800  . prismasol BGK 4/2.5 1,500 mL/hr at 01/01/21 0810   PRN Meds: fentaNYL, heparin, heparin, midazolam, ondansetron (ZOFRAN) IV, polyethylene glycol, sodium chloride flush   Vital Signs    Vitals:   01/01/21 0804 01/01/21 0830 01/01/21 0845 01/01/21 0900  BP:    113/75  Pulse:  (!) 124 (!) 123 (!) 126  Resp:      Temp: 98.8 F (37.1 C)     TempSrc: Oral     SpO2:  99% 98% 100%  Weight:      Height:        Intake/Output Summary  (Last 24 hours) at 01/01/2021 0909 Last data filed at 01/01/2021 0800 Gross per 24 hour  Intake 3690.37 ml  Output 3552 ml  Net 138.37 ml   Last 3 Weights 01/01/2021 12/31/2020 12/30/2020  Weight (lbs) 220 lb 10.9 oz 225 lb 5 oz 228 lb 9.9 oz  Weight (kg) 100.1 kg 102.2 kg 103.7 kg      Telemetry    Atrial flutter.  Rates 110s to 120s.- Personally Reviewed  ECG    01/01/2021: Atrial flutter.  Rate 122 bpm.  2-1 AV conduction. - Personally Reviewed  Physical Exam   VS:  BP 113/75   Pulse (!) 126   Temp 98.8 F (37.1 C) (Oral)   Resp (!) 30   Ht _0  (1.575 m)   Wt 100.1 kg   SpO2 100%   BMI 40.36 kg/m  , BMI Body mass index is 40.36 kg/m. GENERAL: Critically ill-appearing.  Intubated and sedated. HEENT: Pupils equal round and reactive, fundi not visualized, oral mucosa unremarkable NECK:  No jugular venous distention, waveform within normal limits, carotid upstroke brisk and symmetric, no bruits LUNGS:  Clear to auscultation bilaterally HEART: Tachycardic.  Regular rhythm PMI not displaced or sustained,S1 and S2 within normal limits, no S3, no S4, no clicks, no  rubs, no murmurs ABD:  Flat, positive bowel sounds normal in frequency in pitch, no bruits, no rebound, no guarding, no midline pulsatile mass, no hepatomegaly, no splenomegaly EXT:  2 plus pulses throughout, no edema, no cyanosis no clubbing SKIN:  No rashes no nodules NEURO: Opens eyes to touch and voice but does not localize.  Moves all extremities. PSYCH:  Cognitively intact, oriented to person place and time   Labs    High Sensitivity Troponin:   Recent Labs  Lab 12/30/20 1023  TROPONINIHS 88*      Chemistry Recent Labs  Lab 12/29/20 0253 12/29/20 1133 12/30/20 0420 12/30/20 1601 12/31/20 0330 12/31/20 0753 12/31/20 1600 01/01/21 0348  NA 135   < > 129*   < > 131* 137 130* 128*  K 3.6   < > 3.5   < > 3.6 3.7 3.8 3.9  CL 100   < > 93*   < > 97*  --  100 98  CO2 16*   < > 28   < > 26  --  23 22   GLUCOSE 185*   < > 237*   < > 268*  --  173* 212*  BUN 35*   < > 27*   < > 27*  --  28* 26*  CREATININE 4.66*   < > 2.58*   < > 1.92*  --  1.83* 1.62*  CALCIUM 7.9*   < > 7.0*   < > 7.3*  --  7.3* 6.9*  PROT 5.4*  --  7.3  --  8.0  --   --   --   ALBUMIN 1.4*   < > 1.1*   < > 1.1*  --  1.1* <1.0*  AST 35  --  29  --  41  --   --   --   ALT 19  --  14  --  19  --   --   --   ALKPHOS 353*  --  299*  --  372*  --   --   --   BILITOT 6.0*  --  3.8*  --  2.7*  --   --   --   GFRNONAA 11*   < > 22*   < > 32*  --  34* 39*  ANIONGAP 19*   < > 8   < > 8  --  7 8   < > = values in this interval not displayed.     Hematology Recent Labs  Lab 12/29/20 0253 12/30/20 0420 12/31/20 0330 12/31/20 0753  WBC 60.9* 44.0* 35.1*  --   RBC 3.44* 3.11* 3.07*  --   HGB 11.1* 9.8* 9.8* 9.9*  HCT 31.1* 27.2* 27.7* 29.0*  MCV 90.4 87.5 90.2  --   MCH 32.3 31.5 31.9  --   MCHC 35.7 36.0 35.4  --   RDW 13.5 13.2 14.1  --   PLT 156 149* 144*  --     BNP Recent Labs  Lab 12/28/20 1641  BNP 1,323.6*     DDimer No results for input(s): DDIMER in the last 168 hours.   Radiology    DG Chest Port 1 View  Result Date: 12/31/2020 CLINICAL DATA:  Hypoxia EXAM: PORTABLE CHEST 1 VIEW COMPARISON:  December 30, 2020 FINDINGS: Endotracheal tube tip is 7.5 cm above carina. Nasogastric tube tip and side port are below the diaphragm. Central catheter tip is in the superior vena cava. No pneumothorax. No edema or airspace opacity.  There is cardiomegaly with pulmonary vascularity normal. No adenopathy. No bone lesions. IMPRESSION: Tube and catheter positions as described without pneumothorax. No edema or airspace opacity. Stable cardiac enlargement. Electronically Signed   By: Lowella Grip III M.D.   On: 12/31/2020 07:50    Cardiac Studies   n/a  Patient Profile     [name] [name] is a 69F with diabetes, hypertension, hyperlipidemia, GERD, and morbid obesity admitted with septic shock secondary to cellulitis and  left buttock abscess.  Her hospitalization has been complicated by acute oliguric renal failure and encephalopathy.    Cardiology was consulted for atrial flutter with RVR and prolonged QTc.  Assessment & Plan    # Atrial flutter:  New this admission in the setting of critical illness and sepsis.  She does not appear to be volume overloaded.  It will be good to get an echo but unless critical, would be helpful to wait until her heart rates are better controlled to get a better assessment.  Per her nurse she will likely go back to the OR again.  Would start IV heparin once no further surgical interventions will be required.  If she remains in atrial flutter once more medically stable, will plan for TEE/cardioversion this admission.  No other other options for her at this time.  She was just weaned off of Levophed.  If her blood pressure remained stable, would schedule IV metoprolol to see if this helps with her rate control.  She is not a candidate for digoxin given her renal dysfunction.  Continue IV amiodarone for now.  TSH was markedly elevated.  This could be due to acute illness.  We will add free T4 and T3.    For questions or updates, please contact [place] Please consult [email or web address] for contact info under        Signed, Skeet Latch, MD  01/01/2021, 9:09 AM

## 2021-01-01 NOTE — Progress Notes (Signed)
[facility] Progress Note  1 Day Post-Op  Subjective: CC:  Intubated, sedated. On CRRT. WBC 35 yesterday from 44   Objective: Vital signs in last 24 hours: Temp:  [98 F (36.7 C)-99.1 F (37.3 C)] 98.5 F (36.9 C) (06/07 0400) Pulse Rate:  [111-135] 121 (06/07 0700) Resp:  [5-33] 30 (06/07 0302) BP: (89-133)/(52-91) 111/91 (06/07 0500) SpO2:  [98 %-100 %] 100 % (06/07 0700) Arterial Line BP: (86-167)/(43-78) 122/60 (06/07 0700) FiO2 (%):  [30 %] 30 % (06/07 0400) Weight:  [100.1 kg] 100.1 kg (06/07 0500) Last BM Date: 12/31/20  Intake/Output from previous day: 06/06 0701 - 06/07 0700 In: 3775.5 [I.V.:1604.4; NG/GT:1060.8; IV Piggyback:990.2] Out: 3895 [Blood:10] Intake/Output this shift: No intake/output data recorded.  PE: Gen: intubated, sedated  Card:  Sinus tachycardia  Abd: Soft, obese Skin: buttock wound not examined this morning. Psych - unable to assess, sedated.   Lab Results:  Recent Labs    12/30/20 0420 12/31/20 0330 12/31/20 0753  WBC 44.0* 35.1*  --   HGB 9.8* 9.8* 9.9*  HCT 27.2* 27.7* 29.0*  PLT 149* 144*  --    BMET Recent Labs    12/31/20 1600 01/01/21 0348  NA 130* 128*  K 3.8 3.9  CL 100 98  CO2 23 22  GLUCOSE 173* 212*  BUN 28* 26*  CREATININE 1.83* 1.62*  CALCIUM 7.3* 6.9*   PT/INR No results for input(s): LABPROT, INR in the last 72 hours. CMP     Component Value Date/Time   NA 128 (L) 01/01/2021 0348   K 3.9 01/01/2021 0348   CL 98 01/01/2021 0348   CO2 22 01/01/2021 0348   GLUCOSE 212 (H) 01/01/2021 0348   BUN 26 (H) 01/01/2021 0348   CREATININE 1.62 (H) 01/01/2021 0348   CALCIUM 6.9 (L) 01/01/2021 0348   PROT 8.0 12/31/2020 0330   ALBUMIN <1.0 (L) 01/01/2021 0348   AST 41 12/31/2020 0330   ALT 19 12/31/2020 0330   ALKPHOS 372 (H) 12/31/2020 0330   BILITOT 2.7 (H) 12/31/2020 0330   GFRNONAA 39 (L) 01/01/2021 0348   Lipase  No results found for: LIPASE     Studies/Results: DG Chest Port 1  View  Result Date: 12/31/2020 CLINICAL DATA:  Hypoxia EXAM: PORTABLE CHEST 1 VIEW COMPARISON:  December 30, 2020 FINDINGS: Endotracheal tube tip is 7.5 cm above carina. Nasogastric tube tip and side port are below the diaphragm. Central catheter tip is in the superior vena cava. No pneumothorax. No edema or airspace opacity. There is cardiomegaly with pulmonary vascularity normal. No adenopathy. No bone lesions. IMPRESSION: Tube and catheter positions as described without pneumothorax. No edema or airspace opacity. Stable cardiac enlargement. Electronically Signed   By: Bretta Bang III M.D.   On: 12/31/2020 07:50    Anti-infectives: Anti-infectives (From admission, onward)   Start     Dose/Rate Route Frequency Ordered Stop   12/31/20 1400  piperacillin-tazobactam (ZOSYN) IVPB 3.375 g        3.375 g 12.5 mL/hr over 240 Minutes Intravenous Every 6 hours 12/31/20 0918     12/29/20 1400  clindamycin (CLEOCIN) IVPB 900 mg  Status:  Discontinued        900 mg 100 mL/hr over 30 Minutes Intravenous Every 8 hours 12/29/20 1214 12/29/20 1234   12/28/20 2200  linezolid (ZYVOX) IVPB 600 mg        600 mg 300 mL/hr over 60 Minutes Intravenous Every 12 hours 12/28/20 1620     12/28/20 1830  meropenem (MERREM) 1 g in sodium chloride 0.9 % 100 mL IVPB  Status:  Discontinued        1 g 200 mL/hr over 30 Minutes Intravenous Every 8 hours 12/28/20 1731 12/31/20 0918   12/28/20 1800  meropenem (MERREM) 1 g in sodium chloride 0.9 % 100 mL IVPB  Status:  Discontinued        1 g 200 mL/hr over 30 Minutes Intravenous Every 24 hours 12/28/20 1700 12/28/20 1731   12/28/20 1730  meropenem (MERREM) 1 g in sodium chloride 0.9 % 100 mL IVPB  Status:  Discontinued        1 g 200 mL/hr over 30 Minutes Intravenous Every 8 hours 12/28/20 1639 12/28/20 1700     Assessment/Plan Necrotizing soft tissue infection left buttock  POD#3 S/P DEBRIDEMENT LEFT BUTTOCK NECROTIZING SOFT TISSUE INFECTION - TOTAL TISSUE VOLUME 14CM X  12CM X 3CM DEEP 12/28/2020  Dr. Violeta Gelinas POD#1 S/P INCISION AND DRAINAGE ABSCESS BUTTOCK (Left), 16x15x3cm wound - afebrile, WBC downtrending  - intra-op cultures with GNR, E. Coli, resistant to ampicillin and timeth/sulfa  - Dakins dressing changes were started intra-operatively 6/6 due to suspicion for pseudomonas with plan to continue for 72 hours. Given above culture results, will confirm with MD that he wishes to continue Dakins. - continue TF - continue IV abx per ID - patient will likely need to be taken back to the operating room later this week (thurs or Friday) for another look and potentially more debridement.   Below per CCM:  VDRF Septic shock Renal failure  DM2 Obesity   FEN: NPO, IVF, TF ID: outside hospital Cx with GBS, intra-op cultures pending (GNR); merrem, linezolid 6/3 >> abx per ID VTE: SCD's, SQH  Foley: in place for strict I&O's, placed 6/3 Dispo: ICU    LOS: 4 days    Hosie Spangle, [facility] Please see Amion for pager number during day hours 7:00am-4:30pm

## 2021-01-01 NOTE — Progress Notes (Signed)
Attending:    Subjective: Went back to the OR yesterday for further excisional debridement Off levophed this morning Has been back and forth in and out of flutter Remains on CRRT No urine output Intermittently agitated  Objective: Vitals:   01/01/21 1000 01/01/21 1015 01/01/21 1030 01/01/21 1032  BP: 113/80   113/80  Pulse: (!) 125 (!) 122 (!) 125 (!) 126  Resp:    (!) 37  Temp:      TempSrc:      SpO2: 99% 100% 100% 100%  Weight:      Height:       Vent Mode: PRVC FiO2 (%):  [30 %] 30 % Set Rate:  [30 bmp] 30 bmp Vt Set:  [400 mL] 400 mL PEEP:  [5 cmH20] 5 cmH20 Plateau Pressure:  [14 cmH20-22 cmH20] 22 cmH20  Intake/Output Summary (Last 24 hours) at 01/01/2021 1049 Last data filed at 01/01/2021 1000 Gross per 24 hour  Intake 4077.62 ml  Output 3773 ml  Net 304.62 ml    General:  In bed on vent HENT: NCAT ETT in place PULM: CTA B, vent supported breathing CV: RRR, no mgr GI: BS+, soft, nontender MSK: normal bulk and tone Neuro: sedated on vent   CBC    Component Value Date/Time   WBC 35.1 (H) 12/31/2020 0330   RBC 3.07 (L) 12/31/2020 0330   HGB 9.9 (L) 12/31/2020 0753   HCT 29.0 (L) 12/31/2020 0753   PLT 144 (L) 12/31/2020 0330   MCV 90.2 12/31/2020 0330   MCH 31.9 12/31/2020 0330   MCHC 35.4 12/31/2020 0330   RDW 14.1 12/31/2020 0330   LYMPHSABS 2.9 12/31/2020 0330   MONOABS 0.9 12/31/2020 0330   EOSABS 0.0 12/31/2020 0330   BASOSABS 0.1 12/31/2020 0330    BMET    Component Value Date/Time   NA 128 (L) 01/01/2021 0348   K 3.9 01/01/2021 0348   CL 98 01/01/2021 0348   CO2 22 01/01/2021 0348   GLUCOSE 212 (H) 01/01/2021 0348   BUN 26 (H) 01/01/2021 0348   CREATININE 1.62 (H) 01/01/2021 0348   CALCIUM 6.9 (L) 01/01/2021 0348   GFRNONAA 39 (L) 01/01/2021 0348    CXR images reviewed, ETT in good position, lungs clear  Impression/Plan: Necrotizing buttock skin/soft tissue infection, e.coli from wound culture> agree with treating as a  polymicrobial infection with zosyn, linezolid.  Back to OR later this week, continue dressing changes, appreciate ID/general surgery AKI> CRRT to continue, keeping net even Acute respiratory failure with hypoxemia> wean today, extubate? Atrial fib/flutter> appreciate cardiology, continue amiodarone, monitor tele, may need heparin/TEE cardioversion later this hospitalization.  My cc time 33 minutes  [name] Pablo, MD [facility] Pager: [phone] Cell: [phone] After 3pm or if no response, call [phone]

## 2021-01-01 NOTE — Progress Notes (Signed)
[facility]  Date of Admission:  12/28/2020           Reason for visit: Follow up on necrotizing soft tissue infection  Current antibiotics: Linezolid 6/3--present Pip-tazo 6/6--present   Previous antibiotics: Meropenem 6/3--6/6   ASSESSMENT:    Necrotizing skin and soft tissue infection of the left buttock/posterior thigh with TSS: Status postdebridement 12/28/2020 and 12/31/2020.  Intraoperative cultures growing GBS, E. coli, and possible anaerobe.  Currently on pip tazo and linezolid.  Completed IVIG x 3 days.  Likely will need further debridement later this week. Septic shock: Secondary to #1 with improved hemodynamics currently off Levophed. Severe leukocytosis: WBC improving AKI: Remains on CRRT Ventilatory dependent respiratory failure Diabetes, obesity Atrial flutter with RVR: Cardiology following  PLAN:    Continue piperacillin tazobactam and linezolid Follow-up cultures Follow-up further OR planning Has completed IVIG in the setting of streptococcal toxic shock syndrome Wound care, glycemic control, rest of care per primary team and other specialists   Principal Problem:   Necrotizing soft tissue infection Active Problems:   Septic shock ([place])   Sepsis due to Streptococcus, group B ([place])   Acute kidney injury ([place])   Obesity   Type 2 diabetes mellitus with hyperglycemia ([place])   Acute hypercapnic respiratory failure ([place])   Typical atrial flutter ([place])    MEDICATIONS:    Scheduled Meds: . B-complex with vitamin C  1 tablet Per Tube Daily  . chlorhexidine gluconate (MEDLINE KIT)  15 mL Mouth Rinse BID  . Chlorhexidine Gluconate Cloth  6 each Topical Q0600  . docusate  100 mg Per Tube BID  . feeding supplement (PROSource TF)  45 mL Per Tube QID  . heparin  5,000 Units Subcutaneous Q8H  . insulin aspart  0-20 Units Subcutaneous Q4H  . insulin aspart  5 Units Subcutaneous Q4H  . insulin detemir  20 Units Subcutaneous BID  .  mouth rinse  15 mL Mouth Rinse 10 times per day  . oxyCODONE  10 mg Per Tube Q8H  . pantoprazole sodium  40 mg Per Tube Daily  . polyethylene glycol  17 g Per Tube Daily  . sodium chloride flush  10-40 mL Intracatheter Q12H  . sodium hypochlorite   Irrigation BID   Continuous Infusions: .  prismasol BGK 4/2.5 400 mL/hr at 01/01/21 0513  .  prismasol BGK 4/2.5 400 mL/hr at 12/31/20 2335  . sodium chloride 10 mL/hr at 01/01/21 0800  . amiodarone 30 mg/hr (01/01/21 0800)  . feeding supplement (VITAL 1.5 CAL) 1,000 mL (12/31/20 1647)  . fentaNYL infusion INTRAVENOUS 250 mcg/hr (01/01/21 0800)  . linezolid (ZYVOX) IV Stopped (12/31/20 2320)  . norepinephrine (LEVOPHED) Adult infusion 5 mcg/min (01/01/21 0800)  . piperacillin-tazobactam (ZOSYN)  IV 12.5 mL/hr at 01/01/21 0800  . prismasol BGK 4/2.5 1,500 mL/hr at 01/01/21 0810   PRN Meds:.fentaNYL, heparin, heparin, midazolam, ondansetron (ZOFRAN) IV, polyethylene glycol, sodium chloride flush  SUBJECTIVE:   24 hour events:  Status post repeat debridement in the OR yesterday Current wound measuring 16 x 15 x 3 cm Afebrile, T-max 99.1 Tachycardia Remains ventilated Currently off pressors this AM On amiodarone for atrial flutter with RVR OR Cx 6/4 = E coli, GBS, possible anaerobe No CBC drawn today Completed IVIG today   Review of Systems  Unable to perform ROS: Intubated      OBJECTIVE:   Blood pressure (!) 146/80, pulse (!) 121, temperature 98.8 F (37.1 C), temperature source Oral, resp.  rate (!) 30, height 5' 2"  (1.575 m), weight 100.1 kg, SpO2 100 %. Body mass index is 40.36 kg/m.  Physical Exam Constitutional:      Comments: Ill-appearing, intubated, sedated  HENT:     Head: Normocephalic and atraumatic.  Neck:     Comments: Right nontunneled HD catheter Cardiovascular:     Rate and Rhythm: Tachycardia present. Rhythm irregular.  Pulmonary:     Comments: Coarse bilateral breath sounds, ventilated Abdominal:      General: There is distension.     Palpations: Abdomen is soft.     Tenderness: There is no abdominal tenderness.  Skin:    General: Skin is warm and dry.     Findings: No rash.  Neurological:     Comments: Sedated, not following commands, intermittently grimaces      Lab Results: Lab Results  Component Value Date   WBC 35.1 (H) 12/31/2020   HGB 9.9 (L) 12/31/2020   HCT 29.0 (L) 12/31/2020   MCV 90.2 12/31/2020   PLT 144 (L) 12/31/2020    Lab Results  Component Value Date   NA 128 (L) 01/01/2021   K 3.9 01/01/2021   CO2 22 01/01/2021   GLUCOSE 212 (H) 01/01/2021   BUN 26 (H) 01/01/2021   CREATININE 1.62 (H) 01/01/2021   CALCIUM 6.9 (L) 01/01/2021   GFRNONAA 39 (L) 01/01/2021    Lab Results  Component Value Date   ALT 19 12/31/2020   AST 41 12/31/2020   ALKPHOS 372 (H) 12/31/2020   BILITOT 2.7 (H) 12/31/2020    No results found for: CRP  No results found for: ESRSEDRATE   I have reviewed the micro and lab results in Epic.  Imaging: DG Chest Port 1 View  Result Date: 12/31/2020 CLINICAL DATA:  Hypoxia EXAM: PORTABLE CHEST 1 VIEW COMPARISON:  December 30, 2020 FINDINGS: Endotracheal tube tip is 7.5 cm above carina. Nasogastric tube tip and side port are below the diaphragm. Central catheter tip is in the superior vena cava. No pneumothorax. No edema or airspace opacity. There is cardiomegaly with pulmonary vascularity normal. No adenopathy. No bone lesions. IMPRESSION: Tube and catheter positions as described without pneumothorax. No edema or airspace opacity. Stable cardiac enlargement. Electronically Signed   By: Lowella Grip III M.D.   On: 12/31/2020 07:50     Imaging independently reviewed in Epic.    Raynelle [facility] for Infectious [facility] [phone] pager 01/01/2021, 8:34 AM

## 2021-01-01 NOTE — Progress Notes (Signed)
Nutrition Follow-up  DOCUMENTATION CODES:   Morbid obesity  INTERVENTION:   Continue tube feeding via OG tube: Vital 1.5 at 50 ml/h (1200 ml per day) Prosource TF 45 ml QID  Provides 1960 kcal, 125 gm protein, 917 ml free water daily  Continue B-complex with vitamin C while on CRRT  NUTRITION DIAGNOSIS:   Increased nutrient needs related to wound healing,acute illness (L buttock abscess; CRRT) as evidenced by estimated needs.  Ongoing  GOAL:   Patient will meet greater than or equal to 90% of their needs   Met with TF  MONITOR:   Vent status,TF tolerance,Labs,Skin  REASON FOR ASSESSMENT:   Ventilator,Consult Enteral/tube feeding initiation and management  ASSESSMENT:   50 yo female admitted from [place] with worsening renal function, necrotizing soft tissue infection of left buttock. PMH includes Bartholin cyst, HTN, DM-2, GERD.   Discussed patient in ICU rounds and with RN today. Tolerating TF at goal rate without difficulty. Starting Ketamine today, off Levophed. CRRT started 6/3, ongoing. S/P I&D buttock abscess for necrotizing fasciitis 6/6.  Plans to return to the OR later this week.  Patient remains intubated on ventilator support MV: 11.2 L/min Temp (24hrs), Avg:98.7 F (37.1 C), Min:98.2 F (36.8 C), Max:99.1 F (37.3 C)    Labs reviewed. Na 128 CBG: [phone]  Medications reviewed and include B-complex with vitamin C, Novolog, Levemir, Ketamine, Levophed.  Weight down to 100.1 kg today. I/O -2.9 L since admission Anuric Receiving CRRT for volume removal: 3,885 ml removed x 24 hours.  NUTRITION - FOCUSED PHYSICAL EXAM:  unable to complete  Diet Order:   Diet Order            Diet NPO time specified  Diet effective now                 EDUCATION NEEDS:   Not appropriate for education at this time  Skin:  Skin Assessment: Skin Integrity Issues: Skin Integrity Issues:: Other (Comment) Other: L buttock soft tissue infection,  S/P debridement x 2  Last BM:  6/7 type 7  Height:   Ht Readings from Last 1 Encounters:  12/28/20 5' 2"  (1.575 m)    Weight:   Wt Readings from Last 1 Encounters:  01/01/21 100.1 kg    Ideal Body Weight:  50 kg  BMI:  Body mass index is 40.36 kg/m.  Estimated Nutritional Needs:   Kcal:  1900  Protein:  115-125 gm  Fluid:  >/= 1.9 L    Lucas Mallow, RD, LDN, CNSC Please refer to Amion for contact information.

## 2021-01-01 NOTE — Progress Notes (Signed)
eLink Physician-Brief Progress Note Patient Name: Rena Hunke DOB: [birth year] MRN: [id number]   Date of Service  01/01/2021  HPI/Events of Note  Reginia Forts is in the room. Patient comfortable on the ventilator currently, no issues.  eICU Interventions  No intervention.        Thomasene Lot Jermany Rimel 01/01/2021, 9:03 PM

## 2021-01-01 NOTE — Plan of Care (Signed)
  Problem: Education: Goal: Knowledge of General Education information will improve Description Including pain rating scale, medication(s)/side effects and non-pharmacologic comfort measures Outcome: Progressing   Problem: Health Behavior/Discharge Planning: Goal: Ability to manage health-related needs will improve Outcome: Progressing   

## 2021-01-01 NOTE — Progress Notes (Signed)
eLink Physician-Brief Progress Note Patient Name: [name] [name] DOB: 10-27-1970 MRN: [id number]   Date of Service  01/01/2021  HPI/Events of Note  Bedside RN concerned with [facility] showing ST Elev,     ECG performed with results in Epic.  On Vent. S/p necrotizing fascitis I and D.  EKG reviewed and compared to 5 th is showing sinus tachy , ? A flutter. No obvious ST elevation. Not on pressors.  eICU Interventions  Discussed with RN Continue care.      Intervention Category Intermediate Interventions: Diagnostic test evaluation  Ranee Gosselin 01/01/2021, 2:15 AM

## 2021-01-01 NOTE — Progress Notes (Signed)
Patient ID: Shajuana Mclucas, female   DOB: 1970-11-22, 50 y.o.   MRN: [id number] S: Taken to OR yesterday for debridement.  Then developed a flutter with tachycardia. O:BP 113/80   Pulse (!) 124   Temp 98.9 F (37.2 C) (Oral)   Resp (!) 37   Ht 5' 2"  (1.575 m)   Wt 100.1 kg   SpO2 100%   BMI 40.36 kg/m   Intake/Output Summary (Last 24 hours) at 01/01/2021 1206 Last data filed at 01/01/2021 1200 Gross per 24 hour  Intake 3983.31 ml  Output 3710 ml  Net 273.31 ml   Intake/Output: I/O last 3 completed shifts: In: 5774.2 [I.V.:2417.9; Other:120; OQ/HU:7654.6; IV Piggyback:1490.4] Out: 7202 [Other:7192; Blood:10]  Intake/Output this shift:  Total I/O In: 1082.4 [I.V.:282.5; NG/GT:450; IV Piggyback:349.9] Out: 1148 [Other:1148] Weight change: -2.1 kg Gen: intubated and sedated CVS: tachy at 124 Resp: mechanically ventilated BS bilaterally Abd: +BS, soft, NT Ext: no edema  Recent Labs  Lab 12/28/20 1641 12/28/20 2033 12/28/20 2326 12/29/20 0217 12/29/20 0253 12/29/20 1133 12/29/20 1824 12/30/20 0420 12/30/20 1601 12/31/20 0330 12/31/20 0753 12/31/20 1600 01/01/21 0348  NA 131*   < > 132*   < > 135 134* 132* 129* 130* 131* 137 130* 128*  K 4.0   < > 3.8   < > 3.6 3.7 3.3* 3.5 3.8 3.6 3.7 3.8 3.9  CL 100  --  102  --  100 99 96* 93* 94* 97*  --  100 98  CO2 12*  --  16*  --  16* 21* 24 28 27 26   --  23 22  GLUCOSE 133*  --  200*  --  185* 196* 173* 237* 236* 268*  --  173* 212*  BUN 45*  --  43*  --  35* 31* 26* 27* 26* 27*  --  28* 26*  CREATININE 5.97*  --  5.55*  --  4.66* 3.73* 3.24* 2.58* 2.16* 1.92*  --  1.83* 1.62*  ALBUMIN 1.6*  --  1.6*  --  1.4*  --  1.2* 1.1* 1.2* 1.1*  --  1.1* <1.0*  CALCIUM 8.7*  --  8.2*  --  7.9* 7.9* 7.5* 7.0* 7.4* 7.3*  --  7.3* 6.9*  PHOS  --   --   --   --   --  3.7 3.0  3.1 2.4* 1.9*  1.9*  --   --  3.7 3.6  AST 40  --  40  --  35  --   --  29  --  41  --   --   --   ALT 19  --  20  --  19  --   --  14  --  19  --   --   --    < >  = values in this interval not displayed.   Liver Function Tests: Recent Labs  Lab 12/29/20 0253 12/29/20 1824 12/30/20 0420 12/30/20 1601 12/31/20 0330 12/31/20 1600 01/01/21 0348  AST 35  --  29  --  41  --   --   ALT 19  --  14  --  19  --   --   ALKPHOS 353*  --  299*  --  372*  --   --   BILITOT 6.0*  --  3.8*  --  2.7*  --   --   PROT 5.4*  --  7.3  --  8.0  --   --   ALBUMIN  1.4*   < > 1.1*   < > 1.1* 1.1* <1.0*   < > = values in this interval not displayed.   No results for input(s): LIPASE, AMYLASE in the last 168 hours. No results for input(s): AMMONIA in the last 168 hours. CBC: Recent Labs  Lab 12/28/20 1641 12/28/20 2033 12/28/20 2326 12/29/20 0217 12/29/20 0253 12/30/20 0420 12/31/20 0330 12/31/20 0753  WBC 52.3*  --  77.5*  --  60.9* 44.0* 35.1*  --   NEUTROABS  --   --   --   --   --  38.4* 29.9*  --   HGB 11.4*   < > 11.6*   < > 11.1* 9.8* 9.8* 9.9*  HCT 32.7*   < > 34.5*   < > 31.1* 27.2* 27.7* 29.0*  MCV 90.6  --  94.3  --  90.4 87.5 90.2  --   PLT 195  --  253  --  156 149* 144*  --    < > = values in this interval not displayed.   Cardiac Enzymes: No results for input(s): CKTOTAL, CKMB, CKMBINDEX, TROPONINI in the last 168 hours. CBG: Recent Labs  Lab 12/31/20 1950 12/31/20 2327 01/01/21 0339 01/01/21 0744 01/01/21 1103  GLUCAP 144* 160* 164* 167* 162*    Iron Studies: No results for input(s): IRON, TIBC, TRANSFERRIN, FERRITIN in the last 72 hours. Studies/Results: DG Chest Port 1 View  Result Date: 12/31/2020 CLINICAL DATA:  Hypoxia EXAM: PORTABLE CHEST 1 VIEW COMPARISON:  December 30, 2020 FINDINGS: Endotracheal tube tip is 7.5 cm above carina. Nasogastric tube tip and side port are below the diaphragm. Central catheter tip is in the superior vena cava. No pneumothorax. No edema or airspace opacity. There is cardiomegaly with pulmonary vascularity normal. No adenopathy. No bone lesions. IMPRESSION: Tube and catheter positions as described  without pneumothorax. No edema or airspace opacity. Stable cardiac enlargement. Electronically Signed   By: Lowella Grip III M.D.   On: 12/31/2020 07:50   . B-complex with vitamin C  1 tablet Per Tube Daily  . chlorhexidine gluconate (MEDLINE KIT)  15 mL Mouth Rinse BID  . Chlorhexidine Gluconate Cloth  6 each Topical Q0600  . docusate  100 mg Per Tube BID  . feeding supplement (PROSource TF)  45 mL Per Tube QID  . heparin  5,000 Units Subcutaneous Q8H  . insulin aspart  0-20 Units Subcutaneous Q4H  . insulin aspart  5 Units Subcutaneous Q4H  . insulin detemir  20 Units Subcutaneous BID  . mouth rinse  15 mL Mouth Rinse 10 times per day  . oxyCODONE  10 mg Per Tube Q8H  . pantoprazole sodium  40 mg Per Tube Daily  . polyethylene glycol  17 g Per Tube Daily  . sodium chloride flush  10-40 mL Intracatheter Q12H  . sodium hypochlorite   Irrigation BID    BMET    Component Value Date/Time   NA 128 (L) 01/01/2021 0348   K 3.9 01/01/2021 0348   CL 98 01/01/2021 0348   CO2 22 01/01/2021 0348   GLUCOSE 212 (H) 01/01/2021 0348   BUN 26 (H) 01/01/2021 0348   CREATININE 1.62 (H) 01/01/2021 0348   CALCIUM 6.9 (L) 01/01/2021 0348   GFRNONAA 39 (L) 01/01/2021 0348   CBC    Component Value Date/Time   WBC 35.1 (H) 12/31/2020 0330   RBC 3.07 (L) 12/31/2020 0330   HGB 9.9 (L) 12/31/2020 0753   HCT 29.0 (L) 12/31/2020 0370  PLT 144 (L) 12/31/2020 0330   MCV 90.2 12/31/2020 0330   MCH 31.9 12/31/2020 0330   MCHC 35.4 12/31/2020 0330   RDW 14.1 12/31/2020 0330   LYMPHSABS 2.9 12/31/2020 0330   MONOABS 0.9 12/31/2020 0330   EOSABS 0.0 12/31/2020 0330   BASOSABS 0.1 12/31/2020 0330    Assessment/Plan:  1. AKI, oliguric/anuric - presumably ischemic ATN in setting of severe sepsis syndrome and hypotension requiring multiple pressors.  She was started on CRRT 12/28/20 vir RIJ HD catheter placed by PCCM.  Remains anuric and on CRRT.   1. All fluids 4K/2.5Ca, pre and post filter fluids  at 400 ml/hr, dialysate at 1500 ml/hr, no anticoagulation. 2. Keep even. 2. 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 - currently off levophed, s/p debridement on 12/28/20 and again on 12/31/20.  Will likely need another per surgery.  3. VDRF - per PCCM 4. Atrial flutter with RVR:  Cardiology following.  On amiodarone and off levophed. 5. DM type 2 - per primary 6. Acute metabolic encephalopathy 7. Anemia of critical illness - transfuse prn.  8. Hypophosphatemia - repleted IV and follow.   Donetta Potts, MD [employer] [phone]

## 2021-01-02 ENCOUNTER — Inpatient Hospital Stay (HOSPITAL_COMMUNITY): Payer: Medicaid Other

## 2021-01-02 DIAGNOSIS — R0609 Other forms of dyspnea: Secondary | ICD-10-CM

## 2021-01-02 LAB — RENAL FUNCTION PANEL
Albumin: 1.1 g/dL — ABNORMAL LOW (ref 3.5–5.0)
Albumin: 1 g/dL — ABNORMAL LOW (ref 3.5–5.0)
Anion gap: 7 (ref 5–15)
Anion gap: 7 (ref 5–15)
BUN: 23 mg/dL — ABNORMAL HIGH (ref 6–20)
BUN: 22 mg/dL — ABNORMAL HIGH (ref 6–20)
CO2: 23 mmol/L (ref 22–32)
CO2: 23 mmol/L (ref 22–32)
Calcium: 7.5 mg/dL — ABNORMAL LOW (ref 8.9–10.3)
Calcium: 7.2 mg/dL — ABNORMAL LOW (ref 8.9–10.3)
Chloride: 100 mmol/L (ref 98–111)
Chloride: 100 mmol/L (ref 98–111)
Creatinine, Ser: 1.44 mg/dL — ABNORMAL HIGH (ref 0.44–1.00)
Creatinine, Ser: 1.36 mg/dL — ABNORMAL HIGH (ref 0.44–1.00)
GFR, Estimated: 45 mL/min — ABNORMAL LOW (ref >60)
GFR, Estimated: 48 mL/min — ABNORMAL LOW (ref >60)
Glucose, Bld: 194 mg/dL — ABNORMAL HIGH (ref 70–99)
Glucose, Bld: 168 mg/dL — ABNORMAL HIGH (ref 70–99)
Phosphorus: 3.2 mg/dL (ref 2.5–4.6)
Phosphorus: 3.3 mg/dL (ref 2.5–4.6)
Potassium: 4.6 mmol/L (ref 3.5–5.1)
Potassium: 4.5 mmol/L (ref 3.5–5.1)
Sodium: 130 mmol/L — ABNORMAL LOW (ref 135–145)
Sodium: 130 mmol/L — ABNORMAL LOW (ref 135–145)

## 2021-01-02 LAB — COMPREHENSIVE METABOLIC PANEL
ALT: 21 U/L (ref 0–44)
AST: 42 U/L — ABNORMAL HIGH (ref 15–41)
Albumin: 1.1 g/dL — ABNORMAL LOW (ref 3.5–5.0)
Alkaline Phosphatase: 408 U/L — ABNORMAL HIGH (ref 38–126)
Anion gap: 7 (ref 5–15)
BUN: 23 mg/dL — ABNORMAL HIGH (ref 6–20)
CO2: 22 mmol/L (ref 22–32)
Calcium: 7.6 mg/dL — ABNORMAL LOW (ref 8.9–10.3)
Chloride: 100 mmol/L (ref 98–111)
Creatinine, Ser: 1.43 mg/dL — ABNORMAL HIGH (ref 0.44–1.00)
GFR, Estimated: 46 mL/min — ABNORMAL LOW (ref >60)
Glucose, Bld: 194 mg/dL — ABNORMAL HIGH (ref 70–99)
Potassium: 4.5 mmol/L (ref 3.5–5.1)
Sodium: 129 mmol/L — ABNORMAL LOW (ref 135–145)
Total Bilirubin: 2 mg/dL — ABNORMAL HIGH (ref 0.3–1.2)
Total Protein: 7.8 g/dL (ref 6.5–8.1)

## 2021-01-02 LAB — ECHOCARDIOGRAM COMPLETE
Height: 62 in
S' Lateral: 4.4 cm
Weight: 3492.09 oz

## 2021-01-02 LAB — MAGNESIUM: Magnesium: 2.2 mg/dL (ref 1.7–2.4)

## 2021-01-02 LAB — CBC WITH DIFFERENTIAL/PLATELET
Abs Immature Granulocytes: 0.93 10*3/uL — ABNORMAL HIGH (ref 0.00–0.07)
Basophils Absolute: 0.1 10*3/uL (ref 0.0–0.1)
Basophils Relative: 0 %
Eosinophils Absolute: 0.1 10*3/uL (ref 0.0–0.5)
Eosinophils Relative: 0 %
HCT: 22.2 % — ABNORMAL LOW (ref 36.0–46.0)
Hemoglobin: 7.3 g/dL — ABNORMAL LOW (ref 12.0–15.0)
Immature Granulocytes: 4 %
Lymphocytes Relative: 10 %
Lymphs Abs: 2.3 10*3/uL (ref 0.7–4.0)
MCH: 31.2 pg (ref 26.0–34.0)
MCHC: 32.9 g/dL (ref 30.0–36.0)
MCV: 94.9 fL (ref 80.0–100.0)
Monocytes Absolute: 1 10*3/uL (ref 0.1–1.0)
Monocytes Relative: 4 %
Neutro Abs: 19.1 10*3/uL — ABNORMAL HIGH (ref 1.7–7.7)
Neutrophils Relative %: 82 %
Platelets: UNDETERMINED 10*3/uL (ref 150–400)
RBC: 2.34 MIL/uL — ABNORMAL LOW (ref 3.87–5.11)
RDW: 14.8 % (ref 11.5–15.5)
WBC: 23.5 10*3/uL — ABNORMAL HIGH (ref 4.0–10.5)
nRBC: 0.1 % (ref 0.0–0.2)

## 2021-01-02 LAB — GLUCOSE, CAPILLARY
Glucose-Capillary: 167 mg/dL — ABNORMAL HIGH (ref 70–99)
Glucose-Capillary: 165 mg/dL — ABNORMAL HIGH (ref 70–99)
Glucose-Capillary: 180 mg/dL — ABNORMAL HIGH (ref 70–99)
Glucose-Capillary: 150 mg/dL — ABNORMAL HIGH (ref 70–99)
Glucose-Capillary: 156 mg/dL — ABNORMAL HIGH (ref 70–99)
Glucose-Capillary: 145 mg/dL — ABNORMAL HIGH (ref 70–99)

## 2021-01-02 LAB — CULTURE, BLOOD (ROUTINE X 2)
Culture: NO GROWTH
Culture: NO GROWTH
Special Requests: ADEQUATE
Special Requests: ADEQUATE

## 2021-01-02 LAB — T3: T3, Total: 95 ng/dL (ref 71–180)

## 2021-01-02 MED ORDER — LEVALBUTEROL HCL 0.63 MG/3ML IN NEBU
0.6300 mg | INHALATION_SOLUTION | Freq: Four times a day (QID) | RESPIRATORY_TRACT | Status: DC | PRN
  Filled 2021-01-02: qty 3

## 2021-01-02 MED ORDER — METOPROLOL TARTRATE 5 MG/5ML IV SOLN
2.5000 mg | Freq: Four times a day (QID) | INTRAVENOUS | Status: DC
  Administered 2021-01-02 – 2021-01-04 (×8): 2.5 mg via INTRAVENOUS
  Filled 2021-01-02 (×8): qty 5

## 2021-01-02 MED ORDER — PERFLUTREN LIPID MICROSPHERE
1.0000 mL | INTRAVENOUS | Status: AC | PRN
Start: 2021-01-02 — End: 2021-01-02
  Administered 2021-01-02: 2 mL via INTRAVENOUS
  Filled 2021-01-02: qty 10

## 2021-01-02 NOTE — Plan of Care (Signed)

## 2021-01-02 NOTE — Progress Notes (Signed)
Progress Note  Patient Name: [name] [name] Date of Encounter: 01/02/2021  [facility] Cardiologist: Skeet Latch, MD (new)  Subjective   Unable to assess.  Intubated and sedated.  Inpatient Medications    Scheduled Meds: . B-complex with vitamin C  1 tablet Per Tube Daily  . chlorhexidine gluconate (MEDLINE KIT)  15 mL Mouth Rinse BID  . Chlorhexidine Gluconate Cloth  6 each Topical Q0600  . docusate  100 mg Per Tube BID  . feeding supplement (PROSource TF)  45 mL Per Tube QID  . heparin  5,000 Units Subcutaneous Q8H  . insulin aspart  0-20 Units Subcutaneous Q4H  . insulin aspart  5 Units Subcutaneous Q4H  . insulin detemir  20 Units Subcutaneous BID  . mouth rinse  15 mL Mouth Rinse 10 times per day  . oxyCODONE  10 mg Per Tube Q8H  . pantoprazole sodium  40 mg Per Tube Daily  . polyethylene glycol  17 g Per Tube Daily  . sodium chloride flush  10-40 mL Intracatheter Q12H  . sodium hypochlorite   Irrigation BID   Continuous Infusions: .  prismasol BGK 4/2.5 400 mL/hr at 01/02/21 0611  .  prismasol BGK 4/2.5 400 mL/hr at 01/02/21 0103  . sodium chloride Stopped (01/01/21 2325)  . amiodarone 30 mg/hr (01/02/21 0800)  . feeding supplement (VITAL 1.5 CAL) 1,000 mL (12/31/20 1647)  . fentaNYL infusion INTRAVENOUS 400 mcg/hr (01/02/21 0800)  . ketamine (KETALAR) Adult IV Infusion 1 mg/kg/hr (01/02/21 0800)  . linezolid (ZYVOX) IV Stopped (01/01/21 2322)  . norepinephrine (LEVOPHED) Adult infusion 4 mcg/min (01/02/21 0600)  . piperacillin-tazobactam (ZOSYN)  IV 12.5 mL/hr at 01/02/21 0800  . prismasol BGK 4/2.5 1,500 mL/hr at 01/02/21 0812   PRN Meds: fentaNYL, heparin, heparin, levalbuterol, midazolam, ondansetron (ZOFRAN) IV, polyethylene glycol, sodium chloride flush   Vital Signs    Vitals:   01/02/21 0700 01/02/21 0754 01/02/21 0800 01/02/21 0827  BP:   120/79   Pulse: (!) 116  (!) 115   Resp:      Temp:  (!) 97.1 F (36.2 C)    TempSrc:  Axillary     SpO2: 100%  100% 100%  Weight:      Height:        Intake/Output Summary (Last 24 hours) at 01/02/2021 0833 Last data filed at 01/02/2021 0800 Gross per 24 hour  Intake 4118.19 ml  Output 4419 ml  Net -300.81 ml   Last 3 Weights 01/02/2021 01/01/2021 12/31/2020  Weight (lbs) 218 lb 4.1 oz 220 lb 10.9 oz 225 lb 5 oz  Weight (kg) 99 kg 100.1 kg 102.2 kg      Telemetry    Atrial flutter.  Rates 110s to 120s.- Personally Reviewed  ECG    01/01/2021: Atrial flutter.  Rate 122 bpm.  2-1 AV conduction. - Personally Reviewed  Physical Exam   VS:  BP 120/79 (BP Location: Left Arm)   Pulse (!) 115   Temp (!) 97.1 F (36.2 C) (Axillary)   Resp (!) 30   Ht _0  (1.575 m)   Wt 99 kg   SpO2 100%   BMI 39.92 kg/m  , BMI Body mass index is 39.92 kg/m. GENERAL: Critically ill-appearing.  Intubated and sedated. HEENT: Pupils equal round and reactive, fundi not visualized, oral mucosa unremarkable NECK:  No jugular venous distention, waveform within normal limits, carotid upstroke brisk and symmetric, no bruits LUNGS:  Clear to auscultation bilaterally HEART: Tachycardic.  Regular rhythm. PMI not  displaced or sustained,S1 and S2 within normal limits, no S3, no S4, no clicks, no rubs, no murmurs ABD:  Flat, positive bowel sounds normal in frequency in pitch, no bruits, no rebound, no guarding, no midline pulsatile mass, no hepatomegaly, no splenomegaly EXT:  2 plus pulses throughout, no edema, no cyanosis no clubbing SKIN:  No rashes no nodules NEURO: Opens eyes to touch and voice but does not localize.  Moves all extremities. PSYCH:  Cognitively intact, oriented to person place and time   Labs    High Sensitivity Troponin:   Recent Labs  Lab 12/30/20 1023  TROPONINIHS 88*      Chemistry Recent Labs  Lab 12/30/20 0420 12/30/20 1601 12/31/20 0330 12/31/20 0753 01/01/21 0348 01/01/21 1530 01/02/21 0510  NA 129*   < > 131*   < > 128* 128* 129*  130*  K 3.5   < > 3.6   < > 3.9  4.2 4.5  4.6  CL 93*   < > 97*   < > 98 99 100  100  CO2 28   < > 26   < > _0 GLUCOSE 237*   < > 268*   < > 212* 163* 194*  194*  BUN 27*   < > 27*   < > 26* 23* 23*  23*  CREATININE 2.58*   < > 1.92*   < > 1.62* 1.46* 1.43*  1.44*  CALCIUM 7.0*   < > 7.3*   < > 6.9* 7.2* 7.6*  7.5*  PROT 7.3  --  8.0  --   --   --  7.8  ALBUMIN 1.1*   < > 1.1*   < > <1.0* 1.0* 1.1*  1.1*  AST 29  --  41  --   --   --  42*  ALT 14  --  19  --   --   --  21  ALKPHOS 299*  --  372*  --   --   --  408*  BILITOT 3.8*  --  2.7*  --   --   --  2.0*  GFRNONAA 22*   < > 32*   < > 39* 44* 46*  45*  ANIONGAP 8   < > 8   < > _1 < > = values in this interval not displayed.     Hematology Recent Labs  Lab 12/30/20 0420 12/31/20 0330 12/31/20 0753 01/02/21 0510  WBC 44.0* 35.1*  --  23.5*  RBC 3.11* 3.07*  --  2.34*  HGB 9.8* 9.8* 9.9* 7.3*  HCT 27.2* 27.7* 29.0* 22.2*  MCV 87.5 90.2  --  94.9  MCH 31.5 31.9  --  31.2  MCHC 36.0 35.4  --  32.9  RDW 13.2 14.1  --  14.8  PLT 149* 144*  --  PLATELET CLUMPS NOTED ON SMEAR, UNABLE TO ESTIMATE    BNP Recent Labs  Lab 12/28/20 1641  BNP 1,323.6*     DDimer No results for input(s): DDIMER in the last 168 hours.   Radiology    No results found.  Cardiac Studies   n/a  Patient Profile     [name] [name] is a 41F with diabetes, hypertension, hyperlipidemia, GERD, and morbid obesity admitted with septic shock secondary to cellulitis and left buttock abscess.  Her hospitalization has been complicated by acute oliguric renal failure and encephalopathy.    Cardiology was consulted for  atrial flutter with RVR and prolonged QTc.  Assessment & Plan    # Atrial flutter:  New this admission in the setting of critical illness and sepsis.  She does not appear to be volume overloaded.  She will go back to the OR tomorrow.  Leukocytosis is improving.  Heart rates are minimally improved on amiodarone but mostly in the 110s.  She has  been mostly off pressors but briefly required levophed.  Will try metoprolol 2.34m IV q6h and see how she responds.  Plan for TEE/DCCV once she can be on anticoagulation.  Would start heparin as soon as she is OK per surgery.  Will get an echo once heart rate is better controlled.    For questions or updates, please contact [place] consult [email or web address] for contact info under        Signed, TSkeet Latch MD  01/02/2021, 8:33 AM

## 2021-01-02 NOTE — Progress Notes (Signed)
RT note-Attempted to change dressing and line for Aline, RN had called me earlier to check for dampenedd waveform. Catheter was kinked at the end near the hub. Tried to correct and re dress, line was lost, pulled and pressure held at site, RN aware.

## 2021-01-02 NOTE — Progress Notes (Signed)
NAME:  [name] [name], MRN:  [id number], DOB:  08-20-1970, LOS: 5 ADMISSION DATE:  12/28/2020, CONSULTATION DATE:  6/3 REFERRING MD:  [facility] MD, CHIEF COMPLAINT:  Fever, pain and ulceration  History of Present Illness:  50 y/o female initially admitted to [place] on 5/29 complaining of fever and a buttock abscess, moved to [facility] on 6/3 in the setting of worsening renal failure, acidosis, encephaloapthy with known group B strep wound culture.  Pertinent  Medical History  Recent Vulvar irritation and Bartholin Cyst seen at [facility]  HTN DM type II Cholecystectomy  GERD Anxiety  Significant Hospital Events: Including procedures, antibiotic start and stop dates in addition to other pertinent events    5/29 admitted to [place] sepsis felt 2/2 cellulitis on buttocks. Cultures sent. CT abd/pelvis + Williams gas but no drainable abscess. Started on Vanc. IVFs. Wound also cultured.   6/1 wound culture back + group B strep. 1 of 4 blood cultures + GPC. ABX changed to rocephin  6/3 Arrived at  [facility] from [place] w/ new hypotension, worsening renal failure, worsening leukocytosis, poorly controlled pain and new encephalopathy. Admitted to ICU. Started on zyvox and meropenem. Surgical consult requested> to OR for debridement  6/3 started CRRT . 6/3 meropenem > 6/5 . 6/3 linezolid >  . 6/4 IVIg >6/6 . 6/6 zosyn >  . 6/6 back to OR for debridement, in and out of atrial flutter; QTc 590; cardiology consult> amiodarone . 6/7 off vasopressors, continues to have atrial flutter, started ketamine  Interim History / Subjective:  Resting better on ketamine Off vasopressors No urine output Back to OR tomorrow   Objective   Blood pressure 102/72, pulse (!) 101, temperature (!) 96.2 F (35.7 C), temperature source Axillary, resp. rate (!) 30, height 5\' 2"  (1.575 m), weight 99 kg, SpO2 100 %.    Vent Mode: PRVC FiO2 (%):  [30 %] 30 % Set Rate:  [30 bmp] 30 bmp Vt Set:  [400 mL] 400 mL PEEP:   [5 cmH20] 5 cmH20 Pressure Support:  [5 cmH20-8 cmH20] 8 cmH20 Plateau Pressure:  [15 cmH20-18 cmH20] 15 cmH20   Intake/Output Summary (Last 24 hours) at 01/02/2021 1511 Last data filed at 01/02/2021 1500 Gross per 24 hour  Intake 3821.38 ml  Output 4125 ml  Net -303.62 ml   Filed Weights   12/31/20 0334 01/01/21 0500 01/02/21 0500  Weight: 102.2 kg 100.1 kg 99 kg    Examination:  General:  In bed on vent HENT: NCAT ETT in place PULM: CTA B, vent supported breathing CV: RRR, no mgr GI: BS+, soft, nontender MSK: normal bulk and tone Neuro: sedated on vent    Labs/imaging that I havepersonally reviewed  (right click and "Reselect all SmartList Selections" daily)  WBC 23.5 K Wound culture here > e.coli  Resolved Hospital Problem list     Assessment & Plan:   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, s/p wound exploration and debridement on 6/3 Back to OR tomorrow Continue zosyn Monitor off levophed  Oliguric AKI Continue CRRT Monitor BMET and UOP Replace electrolytes as needed Remove volume slowly  Atrial flutter Tele Start metoprolol Continue amiodarone Appreciate cardiology  Acute respiratory failure with hypoxemia Full mechanical vent support VAP prevention Daily WUA/SBT Wean after back from OR   Mild normocytic anemia without bleeding Monitor for bleeding Transfuse PRBC for Hgb < 7 gm/dL  DM2 with hyperglycemia SSI Tube feeding to continue Continue glargine and SSI  Acute metabolic encephalopathy Baseline narcotic dependence, high  pain medication needs this admission Need for sedation for mechanical ventilation RASS goal -2 Continue ketamine and fentanyl  PAD protocol     Best practice (right click and "Reselect all SmartList Selections" daily)  Diet:  Tube Feed  Pain/Anxiety/Delirium protocol (if indicated): Yes (RASS goal -2) VAP protocol (if indicated): Yes DVT prophylaxis: Subcutaneous Heparin GI prophylaxis: H2B Glucose  control:  SSI Yes Central venous access:  Yes, and it is still needed Arterial line:  Yes, and it is still needed Foley:  Yes, and it is still needed Mobility:  bed rest  PT consulted: Yes Last date of multidisciplinary goals of care discussion [called her sister for an update on 6/5, have not had goals of care conversation at this point] Code Status:  full code Disposition: remain in ICU  Labs   CBC: Recent Labs  Lab 12/28/20 2326 12/29/20 0217 12/29/20 0253 12/30/20 0420 12/31/20 0330 12/31/20 0753 01/02/21 0510  WBC 77.5*  --  60.9* 44.0* 35.1*  --  23.5*  NEUTROABS  --   --   --  38.4* 29.9*  --  19.1*  HGB 11.6*   < > 11.1* 9.8* 9.8* 9.9* 7.3*  HCT 34.5*   < > 31.1* 27.2* 27.7* 29.0* 22.2*  MCV 94.3  --  90.4 87.5 90.2  --  94.9  PLT 253  --  156 149* 144*  --  PLATELET CLUMPS NOTED ON SMEAR, UNABLE TO ESTIMATE   < > = values in this interval not displayed.    Basic Metabolic Panel: Recent Labs  Lab 12/30/20 0420 12/30/20 1601 12/31/20 0330 12/31/20 0753 12/31/20 1600 01/01/21 0348 01/01/21 1530 01/02/21 0510  NA 129* 130* 131* 137 130* 128* 128* 129*  130*  K 3.5 3.8 3.6 3.7 3.8 3.9 4.2 4.5  4.6  CL 93* 94* 97*  --  100 98 99 100  100  CO2 28 27 26   --  23 22 24 22  23   GLUCOSE 237* 236* 268*  --  173* 212* 163* 194*  194*  BUN 27* 26* 27*  --  28* 26* 23* 23*  23*  CREATININE 2.58* 2.16* 1.92*  --  1.83* 1.62* 1.46* 1.43*  1.44*  CALCIUM 7.0* 7.4* 7.3*  --  7.3* 6.9* 7.2* 7.6*  7.5*  MG 2.3 2.3 2.3  --   --  2.1  --  2.2  PHOS 2.4* 1.9*  1.9*  --   --  3.7 3.6 3.3 3.2   GFR: Estimated Creatinine Clearance: 53.1 mL/min (A) (by C-G formula based on SCr of 1.44 mg/dL (H)). Recent Labs  Lab 12/28/20 1641 12/28/20 1824 12/28/20 2326 12/29/20 0253 12/30/20 0420 12/31/20 0330 01/02/21 0510  PROCALCITON 17.91  --   --   --   --   --   --   WBC 52.3*  --  77.5* 60.9* 44.0* 35.1* 23.5*  LATICACIDVEN 3.3* 3.4* 5.1*  --   --   --   --     Liver  Function Tests: Recent Labs  Lab 12/28/20 2326 12/29/20 0253 12/29/20 1824 12/30/20 0420 12/30/20 1601 12/31/20 0330 12/31/20 1600 01/01/21 0348 01/01/21 1530 01/02/21 0510  AST 40 35  --  29  --  41  --   --   --  42*  ALT 20 19  --  14  --  19  --   --   --  21  ALKPHOS 449* 353*  --  299*  --  372*  --   --   --  408*  BILITOT 6.0* 6.0*  --  3.8*  --  2.7*  --   --   --  2.0*  PROT 6.2* 5.4*  --  7.3  --  8.0  --   --   --  7.8  ALBUMIN 1.6* 1.4*   < > 1.1*   < > 1.1* 1.1* <1.0* 1.0* 1.1*  1.1*   < > = values in this interval not displayed.   No results for input(s): LIPASE, AMYLASE in the last 168 hours. No results for input(s): AMMONIA in the last 168 hours.  ABG    Component Value Date/Time   PHART 7.497 (H) 12/31/2020 0753   PCO2ART 36.3 12/31/2020 0753   PO2ART 144 (H) 12/31/2020 0753   HCO3 28.2 (H) 12/31/2020 0753   TCO2 29 12/31/2020 0753   ACIDBASEDEF 6.0 (H) 12/29/2020 0217   O2SAT 99.0 12/31/2020 0753     Coagulation Profile: Recent Labs  Lab 12/28/20 1641 12/28/20 2326  INR 1.3* 1.5*    Cardiac Enzymes: No results for input(s): CKTOTAL, CKMB, CKMBINDEX, TROPONINI in the last 168 hours.  HbA1C: Hgb A1c MFr Bld  Date/Time Value Ref Range Status  12/28/2020 06:48 PM 11.1 (H) 4.8 - 5.6 % Final    Comment:    (NOTE)         Prediabetes: 5.7 - 6.4         Diabetes: >6.4         Glycemic control for adults with diabetes: <7.0     CBG: Recent Labs  Lab 01/01/21 2302 01/02/21 0310 01/02/21 0726 01/02/21 1128 01/02/21 1506  GLUCAP 214* 167* 165* 180* 150*     Critical care time: 35 minutes     Heber , MD [facility] Pager: [phone] Cell: [phone] If no response, please call [phone] until 7pm After 7:00 pm call Elink  [phone]

## 2021-01-02 NOTE — Progress Notes (Signed)
Patient ID: [name] [name], female   DOB: 11/19/1970, 50 y.o.   MRN: [id number] S: Pt was seen and examined while on CVVHD.  Orders reviewed and no changes made to CRRT at this time. O:BP 120/79 (BP Location: Left Arm)   Pulse (!) 115   Temp (!) 97.1 F (36.2 C) (Axillary)   Resp (!) 30   Ht _0  (1.575 m)   Wt 99 kg   SpO2 100%   BMI 39.92 kg/m   Intake/Output Summary (Last 24 hours) at 01/02/2021 1048 Last data filed at 01/02/2021 1022 Gross per 24 hour  Intake 3933.97 ml  Output 4271 ml  Net -337.03 ml   Intake/Output: I/O last 3 completed shifts: In: 6002.9 [I.V.:2560.2; NG/GT:2250; IV Piggyback:1192.7] Out: 7482 [Urine:125; Other:6402]  Intake/Output this shift:  Total I/O In: 553.3 [I.V.:234.7; NG/GT:290; IV Piggyback:28.6] Out: 428 [Other:428] Weight change: -1.1 kg Gen: intubated and unresponsive LMB:EMLJQ at 115 Resp: mechanically ventilated BS bilaterally Abd: +BS, soft, NT Ext: no edema  Recent Labs  Lab 12/28/20 1641 12/28/20 2033 12/28/20 2326 12/29/20 0217 12/29/20 0253 12/29/20 1133 12/29/20 1824 12/30/20 0420 12/30/20 1601 12/31/20 0330 12/31/20 0753 12/31/20 1600 01/01/21 0348 01/01/21 1530 01/02/21 0510  NA 131*   < > 132*   < > 135   < > 132* 129* 130* 131* 137 130* 128* 128* 129*  130*  K 4.0   < > 3.8   < > 3.6   < > 3.3* 3.5 3.8 3.6 3.7 3.8 3.9 4.2 4.5  4.6  CL 100  --  102  --  100   < > 96* 93* 94* 97*  --  100 98 99 100  100  CO2 12*  --  16*  --  16*   < > _1 --  _2 GLUCOSE 133*  --  200*  --  185*   < > 173* 237* 236* 268*  --  173* 212* 163* 194*  194*  BUN 45*  --  43*  --  35*   < > 26* 27* 26* 27*  --  28* 26* 23* 23*  23*  CREATININE 5.97*  --  5.55*  --  4.66*   < > 3.24* 2.58* 2.16* 1.92*  --  1.83* 1.62* 1.46* 1.43*  1.44*  ALBUMIN 1.6*  --  1.6*  --  1.4*  --  1.2* 1.1* 1.2* 1.1*  --  1.1* <1.0* 1.0* 1.1*  1.1*  CALCIUM 8.7*  --  8.2*  --  7.9*   < > 7.5* 7.0* 7.4* 7.3*  --  7.3* 6.9* 7.2* 7.6*   7.5*  PHOS  --   --   --   --   --    < > 3.0  3.1 2.4* 1.9*  1.9*  --   --  3.7 3.6 3.3 3.2  AST 40  --  40  --  35  --   --  29  --  41  --   --   --   --  42*  ALT 19  --  20  --  19  --   --  14  --  19  --   --   --   --  21   < > = values in this interval not displayed.   Liver Function Tests: Recent Labs  Lab 12/30/20 0420 12/30/20 1601 12/31/20 0330 12/31/20 1600 01/01/21 0348 01/01/21 1530 01/02/21 0510  AST 29  --  41  --   --   --  42*  ALT 14  --  19  --   --   --  21  ALKPHOS 299*  --  372*  --   --   --  408*  BILITOT 3.8*  --  2.7*  --   --   --  2.0*  PROT 7.3  --  8.0  --   --   --  7.8  ALBUMIN 1.1*   < > 1.1*   < > <1.0* 1.0* 1.1*  1.1*   < > = values in this interval not displayed.   No results for input(s): LIPASE, AMYLASE in the last 168 hours. No results for input(s): AMMONIA in the last 168 hours. CBC: Recent Labs  Lab 12/28/20 2326 12/29/20 0217 12/29/20 0253 12/30/20 0420 12/31/20 0330 12/31/20 0753 01/02/21 0510  WBC 77.5*  --  60.9* 44.0* 35.1*  --  23.5*  NEUTROABS  --   --   --  38.4* 29.9*  --  19.1*  HGB 11.6*   < > 11.1* 9.8* 9.8* 9.9* 7.3*  HCT 34.5*   < > 31.1* 27.2* 27.7* 29.0* 22.2*  MCV 94.3  --  90.4 87.5 90.2  --  94.9  PLT 253  --  156 149* 144*  --  PLATELET CLUMPS NOTED ON SMEAR, UNABLE TO ESTIMATE   < > = values in this interval not displayed.   Cardiac Enzymes: No results for input(s): CKTOTAL, CKMB, CKMBINDEX, TROPONINI in the last 168 hours. CBG: Recent Labs  Lab 01/01/21 1525 01/01/21 1904 01/01/21 2302 01/02/21 0310 01/02/21 0726  GLUCAP 146* 155* 214* 167* 165*    Iron Studies: No results for input(s): IRON, TIBC, TRANSFERRIN, FERRITIN in the last 72 hours. Studies/Results: No results found. . B-complex with vitamin C  1 tablet Per Tube Daily  . chlorhexidine gluconate (MEDLINE KIT)  15 mL Mouth Rinse BID  . Chlorhexidine Gluconate Cloth  6 each Topical Q0600  . docusate  100 mg Per Tube BID  .  feeding supplement (PROSource TF)  45 mL Per Tube QID  . heparin  5,000 Units Subcutaneous Q8H  . insulin aspart  0-20 Units Subcutaneous Q4H  . insulin aspart  5 Units Subcutaneous Q4H  . insulin detemir  20 Units Subcutaneous BID  . mouth rinse  15 mL Mouth Rinse 10 times per day  . metoprolol tartrate  2.5 mg Intravenous Q6H  . oxyCODONE  10 mg Per Tube Q8H  . pantoprazole sodium  40 mg Per Tube Daily  . polyethylene glycol  17 g Per Tube Daily  . sodium chloride flush  10-40 mL Intracatheter Q12H  . sodium hypochlorite   Irrigation BID    BMET    Component Value Date/Time   NA 130 (L) 01/02/2021 0510   NA 129 (L) 01/02/2021 0510   K 4.6 01/02/2021 0510   K 4.5 01/02/2021 0510   CL 100 01/02/2021 0510   CL 100 01/02/2021 0510   CO2 23 01/02/2021 0510   CO2 22 01/02/2021 0510   GLUCOSE 194 (H) 01/02/2021 0510   GLUCOSE 194 (H) 01/02/2021 0510   BUN 23 (H) 01/02/2021 0510   BUN 23 (H) 01/02/2021 0510   CREATININE 1.44 (H) 01/02/2021 0510   CREATININE 1.43 (H) 01/02/2021 0510   CALCIUM 7.5 (L) 01/02/2021 0510   CALCIUM 7.6 (L) 01/02/2021 0510   GFRNONAA 45 (L) 01/02/2021 0510   GFRNONAA 46 (L)  01/02/2021 0510   CBC    Component Value Date/Time   WBC 23.5 (H) 01/02/2021 0510   RBC 2.34 (L) 01/02/2021 0510   HGB 7.3 (L) 01/02/2021 0510   HCT 22.2 (L) 01/02/2021 0510   PLT PLATELET CLUMPS NOTED ON SMEAR, UNABLE TO ESTIMATE 01/02/2021 0510   MCV 94.9 01/02/2021 0510   MCH 31.2 01/02/2021 0510   MCHC 32.9 01/02/2021 0510   RDW 14.8 01/02/2021 0510   LYMPHSABS 2.3 01/02/2021 0510   MONOABS 1.0 01/02/2021 0510   EOSABS 0.1 01/02/2021 0510   BASOSABS 0.1 01/02/2021 0510     Assessment/Plan:  1. AKI, oliguric/anuric - presumably ischemic ATN in setting of severe sepsis syndrome and hypotension requiring multiple pressors. She was started on CRRT 12/28/20 vir RIJ HD catheter placed by PCCM. Remains anuric and on CRRT.  1. All fluids 4K/2.5Ca, pre and post filter  fluids at 400 ml/hr, dialysate at 1500 ml/hr, no anticoagulation. 2. Keep even. 3. Off of pressors so may be able to transition to IHD in the next 24 hours 2. 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 - currently off levophed, s/p debridement on 12/28/20 and again on 12/31/20.  Will likely need another per surgery.  3. VDRF - per PCCM 4. Atrial flutter with RVR:  Cardiology following.  On amiodarone and off levophed. 5. DM type 2 - per primary 6. Acute metabolic encephalopathy 7. Anemia of critical illness - transfuse prn.  8. Hypophosphatemia - repleted IV and follow.   Donetta Potts, MD [employer] [phone]

## 2021-01-02 NOTE — Progress Notes (Signed)
eLink Physician-Brief Progress Note Patient Name: [name] [name] DOB: 1971-03-25 MRN: [id number]   Date of Service  01/02/2021  HPI/Events of Note  Hemoglobin 7.3 gm  eICU Interventions  Stool occult blood test ordered.        Thomasene Lot Rosco Harriott 01/02/2021, 6:33 AM

## 2021-01-02 NOTE — Progress Notes (Signed)
Attending: Medical student note reviewed  Subjective: Started ketamine No fever WBC down  Objective: Vitals:   01/02/21 0754 01/02/21 0800 01/02/21 0827 01/02/21 0900  BP:  120/79    Pulse:  (!) 115  (!) 117  Resp:      Temp: (!) 97.1 F (36.2 C)     TempSrc: Axillary     SpO2:  100% 100% 100%  Weight:      Height:       Vent Mode: PSV;CPAP FiO2 (%):  [30 %] 30 % Set Rate:  [30 bmp] 30 bmp Vt Set:  [400 mL] 400 mL PEEP:  [5 cmH20] 5 cmH20 Pressure Support:  [5 cmH20-8 cmH20] 8 cmH20 Plateau Pressure:  [15 cmH20-22 cmH20] 15 cmH20  Intake/Output Summary (Last 24 hours) at 01/02/2021 0921 Last data filed at 01/02/2021 0900 Gross per 24 hour  Intake 3988.29 ml  Output 4446 ml  Net -457.71 ml    General:  In bed on vent HENT: NCAT ETT in place PULM: CTA B, vent supported breathing CV: RRR, no mgr GI: BS+, soft, nontender MSK: normal bulk and tone Neuro: sedated on vent    CBC    Component Value Date/Time   WBC 23.5 (H) 01/02/2021 0510   RBC 2.34 (L) 01/02/2021 0510   HGB 7.3 (L) 01/02/2021 0510   HCT 22.2 (L) 01/02/2021 0510   PLT PLATELET CLUMPS NOTED ON SMEAR, UNABLE TO ESTIMATE 01/02/2021 0510   MCV 94.9 01/02/2021 0510   MCH 31.2 01/02/2021 0510   MCHC 32.9 01/02/2021 0510   RDW 14.8 01/02/2021 0510   LYMPHSABS 2.3 01/02/2021 0510   MONOABS 1.0 01/02/2021 0510   EOSABS 0.1 01/02/2021 0510   BASOSABS 0.1 01/02/2021 0510    BMET    Component Value Date/Time   NA 130 (L) 01/02/2021 0510   NA 129 (L) 01/02/2021 0510   K 4.6 01/02/2021 0510   K 4.5 01/02/2021 0510   CL 100 01/02/2021 0510   CL 100 01/02/2021 0510   CO2 23 01/02/2021 0510   CO2 22 01/02/2021 0510   GLUCOSE 194 (H) 01/02/2021 0510   GLUCOSE 194 (H) 01/02/2021 0510   BUN 23 (H) 01/02/2021 0510   BUN 23 (H) 01/02/2021 0510   CREATININE 1.44 (H) 01/02/2021 0510   CREATININE 1.43 (H) 01/02/2021 0510   CALCIUM 7.5 (L) 01/02/2021 0510   CALCIUM 7.6 (L) 01/02/2021 0510   GFRNONAA 45  (L) 01/02/2021 0510   GFRNONAA 46 (L) 01/02/2021 0510    CXR images none today  Impression/Plan: Necrotizing facsci/abscess> OR tomorrow, stop linezolid, continue mero Acute respiratory failure with hypoxemia> wean ventilator today with pressure support but no intent to extubate as OR tomorrow Aflutter> start metoprolol 2.5 mg q6 Aki> continue CRRT Hyperglycemia> maintain current regimen Acute metabolic encephalopathy/need for sedation > continue fentanyl/ketamine, increase ketamine infusion today   My cc time 35 minutes  Heber [place], MD [facility] Pager: [phone] Cell: [phone] After 3pm or if no response, call [phone]

## 2021-01-02 NOTE — Progress Notes (Signed)
2 Days Post-Op   Subjective/Chief Complaint: Pt doing well no acute changes o/n Bowel fxn this AM   Objective: Vital signs in last 24 hours: Temp:  [98.5 F (36.9 C)-98.9 F (37.2 C)] 98.5 F (36.9 C) (06/08 0330) Pulse Rate:  [97-131] 116 (06/08 0700) Resp:  [30-37] 30 (06/08 0413) BP: (88-146)/(51-90) 106/76 (06/08 0413) SpO2:  [84 %-100 %] 100 % (06/08 0700) Arterial Line BP: (81-301)/(49-262) 123/105 (06/08 0700) FiO2 (%):  [30 %] 30 % (06/08 0413) Weight:  [99 kg] 99 kg (06/08 0500) Last BM Date: 01/02/21  Intake/Output from previous day: 06/07 0701 - 06/08 0700 In: 4091.6 [I.V.:1821; NG/GT:1500; IV Piggyback:770.7] Out: 4244 [Urine:125] Intake/Output this shift: No intake/output data recorded.  PE:  Constitutional: No acute distress, sedated, appears states age. Eyes: Anicteric sclerae, moist conjunctiva, no lid lag Lungs: Clear to auscultation bilaterally, normal respiratory effort, on Vent CV: regular rate and rhythm, no murmurs, no peripheral edema, pedal pulses 2+ GI: Soft, no masses or hepatosplenomegaly, non-tender to palpation Skin: Pt with some necrosis around wound, contaminated with stool presently    Lab Results:  Recent Labs    12/31/20 0330 12/31/20 0753 01/02/21 0510  WBC 35.1*  --  23.5*  HGB 9.8* 9.9* 7.3*  HCT 27.7* 29.0* 22.2*  PLT 144*  --  PLATELET CLUMPS NOTED ON SMEAR, UNABLE TO ESTIMATE   BMET Recent Labs    01/01/21 1530 01/02/21 0510  NA 128* 129*  130*  K 4.2 4.5  4.6  CL 99 100  100  CO2 24 22  23   GLUCOSE 163* 194*  194*  BUN 23* 23*  23*  CREATININE 1.46* 1.43*  1.44*  CALCIUM 7.2* 7.6*  7.5*   PT/INR No results for input(s): LABPROT, INR in the last 72 hours. ABG Recent Labs    12/31/20 0753  PHART 7.497*  HCO3 28.2*    Studies/Results: No results found.  Anti-infectives: Anti-infectives (From admission, onward)   Start     Dose/Rate Route Frequency Ordered Stop   01/01/21 1800   piperacillin-tazobactam (ZOSYN) IVPB 3.375 g        3.375 g 12.5 mL/hr over 240 Minutes Intravenous Every 6 hours 01/01/21 1059     12/31/20 1400  piperacillin-tazobactam (ZOSYN) IVPB 3.375 g  Status:  Discontinued        3.375 g 12.5 mL/hr over 240 Minutes Intravenous Every 6 hours 12/31/20 0918 01/01/21 1059   12/29/20 1400  clindamycin (CLEOCIN) IVPB 900 mg  Status:  Discontinued        900 mg 100 mL/hr over 30 Minutes Intravenous Every 8 hours 12/29/20 1214 12/29/20 1234   12/28/20 2200  linezolid (ZYVOX) IVPB 600 mg        600 mg 300 mL/hr over 60 Minutes Intravenous Every 12 hours 12/28/20 1620     12/28/20 1830  meropenem (MERREM) 1 g in sodium chloride 0.9 % 100 mL IVPB  Status:  Discontinued        1 g 200 mL/hr over 30 Minutes Intravenous Every 8 hours 12/28/20 1731 12/31/20 0918   12/28/20 1800  meropenem (MERREM) 1 g in sodium chloride 0.9 % 100 mL IVPB  Status:  Discontinued        1 g 200 mL/hr over 30 Minutes Intravenous Every 24 hours 12/28/20 1700 12/28/20 1731   12/28/20 1730  meropenem (MERREM) 1 g in sodium chloride 0.9 % 100 mL IVPB  Status:  Discontinued        1 g 200  mL/hr over 30 Minutes Intravenous Every 8 hours 12/28/20 1639 12/28/20 1700      Assessment/Plan: Necrotizing soft tissue infection left buttock  POD#4S/P DEBRIDEMENTLEFTBUTTOCK NECROTIZING SOFT TISSUE INFECTION - TOTAL TISSUE VOLUME 14CM X 12CM X 3CM DEEP 12/28/2020  Dr. Violeta Gelinas POD#2 S/P INCISION AND DRAINAGE ABSCESS BUTTOCK (Left), 16x15x3cm wound - afebrile, WBC downtrending  - intra-op cultures with GNR, E. Coli, resistant to ampicillin and timeth/sulfa  - Dakins dressing changes were started intra-operatively 6/6 due to suspicion for pseudomonas with plan to continue for 72 hours.  - continue TF - continue IV abx per ID - patient will likely to be taken back to the OR Thurs for debridement  Below per CCM:  VDRF Septic shock Renal failure  DM2 Obesity   FEN: NPO, IVF,  TF ID: outside hospital Cx with GBS, intra-op cultures pending (GNR); merrem, linezolid 6/3 >> abx per ID VTE: SCD's, SQH  Foley: in place for strict I&O's, placed 6/3 Dispo: ICU    LOS: 5 days    Axel Filler 01/02/2021

## 2021-01-02 NOTE — Progress Notes (Signed)
  Echocardiogram 2D Echocardiogram has been performed.  Augustine Radar 01/02/2021, 3:58 PM

## 2021-01-02 NOTE — Progress Notes (Signed)
[facility] Disease  Date of Admission:  12/28/2020           Reason for visit: Follow up on necrotizing soft tissue infection  Current antibiotics: Linezolid 6/3--present Pip-tazo 6/6--present   Previous antibiotics: Meropenem 6/3--6/6   ASSESSMENT:    Polymicrobial necrotizing skin and soft tissue infection of the left buttock/posterior thigh with TSS: She is status postdebridement 6/3 and 6/6.  Most recent intraoperative cultures grew GBS, E. coli, and Prevotella.  Currently on piperacillin tazobactam and linezolid after also completing 3 days of IVIG.  Planning for return to the OR tomorrow. Septic shock: Secondary to #1 with improved hemodynamics and currently off Levophed after briefly requiring support this morning for 15 minutes per her RN. Severe leukocytosis: Improving and secondary to #1 AKI: Remains on CRRT Ventilatory dependent respiratory failure: Remains intubated Diabetes, obesity Atrial flutter with RVR: Cardiology following  PLAN:    Continue piperacillin tazobactam which will cover all bacteria isolated thus far Can discontinue linezolid as this was added for toxin effect and she is completing greater than 72 hours with no MRSA isolated on cultures Return to the OR tomorrow Has completed IVIG in the setting of streptococcal toxic shock Wound care, glycemic control.  Rest of care per primary team and other specialists.   Principal Problem:   Necrotizing soft tissue infection Active Problems:   Septic shock ([place])   Sepsis due to Streptococcus, group B (HCC)   Acute kidney injury ([place])   Obesity   Type 2 diabetes mellitus with hyperglycemia (HCC)   Acute hypercapnic respiratory failure (HCC)   Typical atrial flutter (HCC)    MEDICATIONS:    Scheduled Meds: . B-complex with vitamin C  1 tablet Per Tube Daily  . chlorhexidine gluconate (MEDLINE KIT)  15 mL Mouth Rinse BID  . Chlorhexidine Gluconate Cloth  6 each Topical  Q0600  . docusate  100 mg Per Tube BID  . feeding supplement (PROSource TF)  45 mL Per Tube QID  . heparin  5,000 Units Subcutaneous Q8H  . insulin aspart  0-20 Units Subcutaneous Q4H  . insulin aspart  5 Units Subcutaneous Q4H  . insulin detemir  20 Units Subcutaneous BID  . mouth rinse  15 mL Mouth Rinse 10 times per day  . oxyCODONE  10 mg Per Tube Q8H  . pantoprazole sodium  40 mg Per Tube Daily  . polyethylene glycol  17 g Per Tube Daily  . sodium chloride flush  10-40 mL Intracatheter Q12H  . sodium hypochlorite   Irrigation BID   Continuous Infusions: .  prismasol BGK 4/2.5 400 mL/hr at 01/02/21 0611  .  prismasol BGK 4/2.5 400 mL/hr at 01/02/21 0103  . sodium chloride Stopped (01/01/21 2325)  . amiodarone 30 mg/hr (01/02/21 0800)  . feeding supplement (VITAL 1.5 CAL) 1,000 mL (12/31/20 1647)  . fentaNYL infusion INTRAVENOUS 400 mcg/hr (01/02/21 0800)  . ketamine (KETALAR) Adult IV Infusion 1 mg/kg/hr (01/02/21 0800)  . linezolid (ZYVOX) IV Stopped (01/01/21 2322)  . norepinephrine (LEVOPHED) Adult infusion 4 mcg/min (01/02/21 0600)  . piperacillin-tazobactam (ZOSYN)  IV 12.5 mL/hr at 01/02/21 0800  . prismasol BGK 4/2.5 1,500 mL/hr at 01/02/21 0812   PRN Meds:.fentaNYL, heparin, heparin, levalbuterol, midazolam, ondansetron (ZOFRAN) IV, polyethylene glycol, sodium chloride flush  SUBJECTIVE:   24 hour events:  NAEO Cx = E coli, GBS, Prevotella WBC improved CVVH continued Improved hemodynamics Afebrile Tachy Right IJ HD cath flexiseal placed due to stool contaminating  wound  Review of Systems  Unable to perform ROS: Intubated      OBJECTIVE:   Blood pressure 106/76, pulse (!) 116, temperature (!) 97.1 F (36.2 C), temperature source Axillary, resp. rate (!) 30, height 5' 2"  (1.575 m), weight 99 kg, SpO2 100 %. Body mass index is 39.92 kg/m.  Physical Exam Constitutional:      Comments: Ill appearing, ventilated, sedated.   HENT:     Head: Normocephalic  and atraumatic.  Neck:     Comments: Right IJ HD cath ET tube Cardiovascular:     Rate and Rhythm: Tachycardia present.  Pulmonary:     Comments: Ventilated breath sounds. Diminished at bases.  Abdominal:     Palpations: Abdomen is soft.     Tenderness: There is no abdominal tenderness.  Skin:    General: Skin is warm and dry.     Findings: No rash.     Comments: Wound media pictures reviewed.   Neurological:     Comments: Sedated. Grimacing intermittently.  Able to squeeze my hand on command.       Lab Results: Lab Results  Component Value Date   WBC 23.5 (H) 01/02/2021   HGB 7.3 (L) 01/02/2021   HCT 22.2 (L) 01/02/2021   MCV 94.9 01/02/2021   PLT PLATELET CLUMPS NOTED ON SMEAR, UNABLE TO ESTIMATE 01/02/2021    Lab Results  Component Value Date   NA 130 (L) 01/02/2021   NA 129 (L) 01/02/2021   K 4.6 01/02/2021   K 4.5 01/02/2021   CO2 23 01/02/2021   CO2 22 01/02/2021   GLUCOSE 194 (H) 01/02/2021   GLUCOSE 194 (H) 01/02/2021   BUN 23 (H) 01/02/2021   BUN 23 (H) 01/02/2021   CREATININE 1.44 (H) 01/02/2021   CREATININE 1.43 (H) 01/02/2021   CALCIUM 7.5 (L) 01/02/2021   CALCIUM 7.6 (L) 01/02/2021   GFRNONAA 45 (L) 01/02/2021   GFRNONAA 46 (L) 01/02/2021    Lab Results  Component Value Date   ALT 21 01/02/2021   AST 42 (H) 01/02/2021   ALKPHOS 408 (H) 01/02/2021   BILITOT 2.0 (H) 01/02/2021    No results found for: CRP  No results found for: ESRSEDRATE   I have reviewed the micro and lab results in Epic.  Imaging: No results found.   Imaging independently reviewed in Epic.    Raynelle Highland for Infectious [facility] [phone] pager 01/02/2021, 8:17 AM

## 2021-01-02 NOTE — Progress Notes (Deleted)
Had a long d/w the pt's son and daughter in re: to the pts CT and drainage from the abd wall Pt is currently 14d from her original operation Some succuss drainage from midline wound I d/w the options on operative vs non op mgmt at this point post op.  I did d/w that there may be more harm at this time if we were to operate. Eakins pouch may help control drainage.  I did d/w them that there could still be some intraabd leakage as well.  This may result in abscess and or sepsis.  I think either way pt is at high risk of sepsis and a poor surgical candidate at this time. We will con't to monitor her output and condition 

## 2021-01-03 ENCOUNTER — Inpatient Hospital Stay (HOSPITAL_COMMUNITY): Payer: Medicaid Other | Admitting: Anesthesiology

## 2021-01-03 ENCOUNTER — Encounter (HOSPITAL_COMMUNITY)
Admission: AD | Disposition: A | Discharge status: Home Health | Payer: Self-pay | Source: Other Acute Inpatient Hospital | Attending: Pulmonary Disease

## 2021-01-03 ENCOUNTER — Inpatient Hospital Stay (HOSPITAL_COMMUNITY): Payer: Medicaid Other

## 2021-01-03 DIAGNOSIS — E669 Obesity, unspecified: Secondary | ICD-10-CM | POA: Diagnosis not present

## 2021-01-03 HISTORY — PX: WOUND DEBRIDEMENT: SHX247

## 2021-01-03 LAB — GLUCOSE, CAPILLARY
Glucose-Capillary: 99 mg/dL (ref 70–99)
Glucose-Capillary: 50 mg/dL — ABNORMAL LOW (ref 70–99)
Glucose-Capillary: 137 mg/dL — ABNORMAL HIGH (ref 70–99)
Glucose-Capillary: 78 mg/dL (ref 70–99)
Glucose-Capillary: 136 mg/dL — ABNORMAL HIGH (ref 70–99)
Glucose-Capillary: 131 mg/dL — ABNORMAL HIGH (ref 70–99)
Glucose-Capillary: 140 mg/dL — ABNORMAL HIGH (ref 70–99)

## 2021-01-03 LAB — CBC WITH DIFFERENTIAL/PLATELET
Abs Immature Granulocytes: 0.29 10*3/uL — ABNORMAL HIGH (ref 0.00–0.07)
Basophils Absolute: 0.1 10*3/uL (ref 0.0–0.1)
Basophils Relative: 0 %
Eosinophils Absolute: 0.1 10*3/uL (ref 0.0–0.5)
Eosinophils Relative: 1 %
HCT: 26.3 % — ABNORMAL LOW (ref 36.0–46.0)
Hemoglobin: 8.7 g/dL — ABNORMAL LOW (ref 12.0–15.0)
Immature Granulocytes: 2 %
Lymphocytes Relative: 14 %
Lymphs Abs: 2.2 10*3/uL (ref 0.7–4.0)
MCH: 31.4 pg (ref 26.0–34.0)
MCHC: 33.1 g/dL (ref 30.0–36.0)
MCV: 94.9 fL (ref 80.0–100.0)
Monocytes Absolute: 1.3 10*3/uL — ABNORMAL HIGH (ref 0.1–1.0)
Monocytes Relative: 9 %
Neutro Abs: 11.5 10*3/uL — ABNORMAL HIGH (ref 1.7–7.7)
Neutrophils Relative %: 74 %
Platelets: DECREASED 10*3/uL (ref 150–400)
RBC: 2.77 MIL/uL — ABNORMAL LOW (ref 3.87–5.11)
RDW: 14.7 % (ref 11.5–15.5)
WBC: 15.4 10*3/uL — ABNORMAL HIGH (ref 4.0–10.5)
nRBC: 0 % (ref 0.0–0.2)

## 2021-01-03 LAB — PHOSPHORUS: Phosphorus: 2.8 mg/dL (ref 2.5–4.6)

## 2021-01-03 LAB — CULTURE, BLOOD (ROUTINE X 2)
Culture: NO GROWTH
Culture: NO GROWTH
Special Requests: ADEQUATE
Special Requests: ADEQUATE

## 2021-01-03 LAB — MAGNESIUM: Magnesium: 2.1 mg/dL (ref 1.7–2.4)

## 2021-01-03 LAB — COMPREHENSIVE METABOLIC PANEL
ALT: 21 U/L (ref 0–44)
AST: 38 U/L (ref 15–41)
Albumin: 1.1 g/dL — ABNORMAL LOW (ref 3.5–5.0)
Alkaline Phosphatase: 389 U/L — ABNORMAL HIGH (ref 38–126)
Anion gap: 9 (ref 5–15)
BUN: 20 mg/dL (ref 6–20)
CO2: 25 mmol/L (ref 22–32)
Calcium: 7.5 mg/dL — ABNORMAL LOW (ref 8.9–10.3)
Chloride: 98 mmol/L (ref 98–111)
Creatinine, Ser: 1.22 mg/dL — ABNORMAL HIGH (ref 0.44–1.00)
GFR, Estimated: 55 mL/min — ABNORMAL LOW (ref >60)
Glucose, Bld: 69 mg/dL — ABNORMAL LOW (ref 70–99)
Potassium: 4.3 mmol/L (ref 3.5–5.1)
Sodium: 132 mmol/L — ABNORMAL LOW (ref 135–145)
Total Bilirubin: 1.6 mg/dL — ABNORMAL HIGH (ref 0.3–1.2)
Total Protein: 8 g/dL (ref 6.5–8.1)

## 2021-01-03 LAB — AEROBIC/ANAEROBIC CULTURE W GRAM STAIN (SURGICAL/DEEP WOUND): Gram Stain: NONE SEEN

## 2021-01-03 SURGERY — DEBRIDEMENT, WOUND
Anesthesia: General | Site: Buttocks | Laterality: Left

## 2021-01-03 MED ORDER — QUETIAPINE FUMARATE 25 MG PO TABS
25.0000 mg | ORAL_TABLET | Freq: Every day | ORAL | Status: DC
  Administered 2021-01-03 – 2021-01-05 (×3): 25 mg
  Filled 2021-01-03 (×3): qty 1

## 2021-01-03 MED ORDER — PHENYLEPHRINE 40 MCG/ML (10ML) SYRINGE FOR IV PUSH (FOR BLOOD PRESSURE SUPPORT)
PREFILLED_SYRINGE | INTRAVENOUS | Status: AC
  Filled 2021-01-03: qty 10

## 2021-01-03 MED ORDER — ACETAMINOPHEN 325 MG PO TABS
650.0000 mg | ORAL_TABLET | ORAL | Status: AC | PRN
  Administered 2021-01-04 (×2): 650 mg via NASOGASTRIC
  Filled 2021-01-03 (×2): qty 2

## 2021-01-03 MED ORDER — OXYCODONE HCL 5 MG/5ML PO SOLN
10.0000 mg | ORAL | Status: DC
  Administered 2021-01-03 – 2021-01-06 (×20): 10 mg
  Filled 2021-01-03 (×20): qty 10

## 2021-01-03 MED ORDER — MIDAZOLAM HCL 2 MG/2ML IJ SOLN
INTRAMUSCULAR | Status: AC
  Filled 2021-01-03: qty 2

## 2021-01-03 MED ORDER — LIDOCAINE HCL (PF) 2 % IJ SOLN
INTRAMUSCULAR | Status: AC
  Filled 2021-01-03: qty 5

## 2021-01-03 MED ORDER — PROPOFOL 10 MG/ML IV BOLUS
INTRAVENOUS | Status: AC
  Filled 2021-01-03: qty 20

## 2021-01-03 MED ORDER — DAKINS (1/4 STRENGTH) 0.125 % EX SOLN
Freq: Once | CUTANEOUS | Status: DC
  Filled 2021-01-03 (×2): qty 473

## 2021-01-03 MED ORDER — 0.9 % SODIUM CHLORIDE (POUR BTL) OPTIME
TOPICAL | Status: DC | PRN
  Administered 2021-01-03: 1000 mL

## 2021-01-03 MED ORDER — CLONAZEPAM 0.5 MG PO TBDP
1.0000 mg | ORAL_TABLET | Freq: Three times a day (TID) | ORAL | Status: DC
  Administered 2021-01-03 – 2021-01-06 (×11): 1 mg
  Filled 2021-01-03 (×6): qty 2
  Filled 2021-01-03 (×2): qty 4
  Filled 2021-01-03 (×3): qty 2

## 2021-01-03 MED ORDER — ROCURONIUM BROMIDE 10 MG/ML (PF) SYRINGE
PREFILLED_SYRINGE | INTRAVENOUS | Status: DC | PRN
  Administered 2021-01-03: 100 mg via INTRAVENOUS

## 2021-01-03 MED ORDER — FENTANYL CITRATE (PF) 250 MCG/5ML IJ SOLN
INTRAMUSCULAR | Status: AC
  Filled 2021-01-03: qty 5

## 2021-01-03 MED ORDER — PIPERACILLIN-TAZOBACTAM 3.375 G IVPB
3.3750 g | Freq: Three times a day (TID) | INTRAVENOUS | Status: DC
  Administered 2021-01-03 – 2021-01-04 (×3): 3.375 g via INTRAVENOUS
  Filled 2021-01-03 (×4): qty 50

## 2021-01-03 MED ORDER — MIDAZOLAM HCL 2 MG/2ML IJ SOLN
1.0000 mg | INTRAMUSCULAR | Status: DC | PRN
  Administered 2021-01-03 – 2021-01-06 (×9): 2 mg via INTRAVENOUS
  Administered 2021-01-06 (×2): 1 mg via INTRAVENOUS
  Filled 2021-01-03 (×11): qty 2

## 2021-01-03 MED ORDER — INSULIN DETEMIR 100 UNIT/ML ~~LOC~~ SOLN
10.0000 [IU] | Freq: Two times a day (BID) | SUBCUTANEOUS | Status: DC
  Administered 2021-01-03: 10 [IU] via SUBCUTANEOUS
  Filled 2021-01-03 (×3): qty 0.1

## 2021-01-03 MED ORDER — ROCURONIUM BROMIDE 10 MG/ML (PF) SYRINGE
PREFILLED_SYRINGE | INTRAVENOUS | Status: AC
  Filled 2021-01-03: qty 20

## 2021-01-03 MED ORDER — DEXTROSE 50 % IV SOLN
INTRAVENOUS | Status: AC
  Administered 2021-01-03: 50 mL
  Filled 2021-01-03: qty 50

## 2021-01-03 MED ORDER — ALBUMIN HUMAN 5 % IV SOLN: INTRAVENOUS | Status: DC | PRN

## 2021-01-03 MED ORDER — MIDAZOLAM HCL 5 MG/5ML IJ SOLN
INTRAMUSCULAR | Status: DC | PRN
  Administered 2021-01-03: 2 mg via INTRAVENOUS

## 2021-01-03 MED ORDER — INSULIN ASPART 100 UNIT/ML IJ SOLN
3.0000 [IU] | INTRAMUSCULAR | Status: DC
  Administered 2021-01-04 – 2021-01-06 (×15): 3 [IU] via SUBCUTANEOUS

## 2021-01-03 MED ORDER — SODIUM CHLORIDE 0.9 % IV SOLN: INTRAVENOUS | Status: DC | PRN

## 2021-01-03 MED ORDER — MIDAZOLAM HCL 2 MG/2ML IJ SOLN: 1.0000 mg | INTRAMUSCULAR | Status: DC | PRN

## 2021-01-03 MED ORDER — PHENYLEPHRINE 40 MCG/ML (10ML) SYRINGE FOR IV PUSH (FOR BLOOD PRESSURE SUPPORT)
PREFILLED_SYRINGE | INTRAVENOUS | Status: DC | PRN
  Administered 2021-01-03 (×2): 120 ug via INTRAVENOUS
  Administered 2021-01-03 (×2): 80 ug via INTRAVENOUS

## 2021-01-03 SURGICAL SUPPLY — 31 items
BNDG GAUZE ELAST 4 BULKY (GAUZE/BANDAGES/DRESSINGS) ×2 IMPLANT
CANISTER SUCT 3000ML PPV (MISCELLANEOUS) ×3 IMPLANT
COVER SURGICAL LIGHT HANDLE (MISCELLANEOUS) ×3 IMPLANT
COVER WAND RF STERILE (DRAPES) ×3 IMPLANT
DRAPE LAPAROSCOPIC ABDOMINAL (DRAPES) ×2 IMPLANT
DRSG PAD ABDOMINAL 8X10 ST (GAUZE/BANDAGES/DRESSINGS) ×3 IMPLANT
ELECT REM PT RETURN 9FT ADLT (ELECTROSURGICAL) ×3
ELECTRODE REM PT RTRN 9FT ADLT (ELECTROSURGICAL) ×1 IMPLANT
GAUZE SPONGE 4X4 12PLY STRL (GAUZE/BANDAGES/DRESSINGS) ×3 IMPLANT
GLOVE BIO SURGEON STRL SZ7.5 (GLOVE) ×3 IMPLANT
GLOVE SRG 8 PF TXTR STRL LF DI (GLOVE) ×1 IMPLANT
GLOVE SURG UNDER POLY LF SZ8 (GLOVE) ×3
GOWN STRL REUS W/ TWL LRG LVL3 (GOWN DISPOSABLE) ×1 IMPLANT
GOWN STRL REUS W/ TWL XL LVL3 (GOWN DISPOSABLE) ×1 IMPLANT
GOWN STRL REUS W/TWL LRG LVL3 (GOWN DISPOSABLE) ×3
GOWN STRL REUS W/TWL XL LVL3 (GOWN DISPOSABLE) ×3
KIT BASIN OR (CUSTOM PROCEDURE TRAY) ×3 IMPLANT
KIT TURNOVER KIT B (KITS) ×3 IMPLANT
NS IRRIG 1000ML POUR BTL (IV SOLUTION) ×3 IMPLANT
PACK GENERAL/GYN (CUSTOM PROCEDURE TRAY) ×2 IMPLANT
PAD ARMBOARD 7.5X6 YLW CONV (MISCELLANEOUS) ×3 IMPLANT
PENCIL SMOKE EVACUATOR (MISCELLANEOUS) ×3 IMPLANT
SUT VIC AB 0 CT1 27 (SUTURE) ×3
SUT VIC AB 0 CT1 27XBRD ANBCTR (SUTURE) IMPLANT
SWAB COLLECTION DEVICE MRSA (MISCELLANEOUS) IMPLANT
TAPE CLOTH SURG 6X10 WHT LF (GAUZE/BANDAGES/DRESSINGS) ×2 IMPLANT
TOWEL GREEN STERILE (TOWEL DISPOSABLE) ×3 IMPLANT
TOWEL GREEN STERILE FF (TOWEL DISPOSABLE) ×3 IMPLANT
TUBE CONNECTING 12'X1/4 (SUCTIONS) ×1
TUBE CONNECTING 12X1/4 (SUCTIONS) ×2 IMPLANT
UNDERPAD 30X36 HEAVY ABSORB (UNDERPADS AND DIAPERS) ×3 IMPLANT

## 2021-01-03 NOTE — Anesthesia Preprocedure Evaluation (Signed)
Anesthesia Evaluation  Patient identified by MRN, date of birth, ID bandGeneral Assessment Comment:Intubated, sedated  Reviewed: Allergy & Precautions, NPO status , Patient's Chart, lab work & pertinent test results  History of Anesthesia Complications Negative for: history of anesthetic complications  Airway Mallampati: Intubated       Dental   Pulmonary Current Smoker and Patient abstained from smoking.,       + intubated    Cardiovascular + dysrhythmias Atrial Fibrillation  Rhythm:Regular Rate:Tachycardia     Neuro/Psych PSYCHIATRIC DISORDERS Depression negative neurological ROS     GI/Hepatic Neg liver ROS, GERD  ,  Endo/Other  diabetes, Poorly Controlled, Type obesity  Renal/GU ARF and DialysisRenal disease  negative genitourinary   Musculoskeletal negative musculoskeletal ROS (+)   Abdominal   Peds  Hematology  (+) anemia ,   Anesthesia Other Findings  Admitted to ICU with necrotizing fasciitis of buttock and associated septic shock, acute respiratory failure requiring mechanical ventilation, and AKI on CRRT.  Reproductive/Obstetrics                             Anesthesia Physical Anesthesia Plan  ASA: 3  Anesthesia Plan: General   Post-op Pain Management:    Induction: Intravenous  PONV Risk Score and Plan: 2 and Treatment may vary due to age or medical condition  Airway Management Planned: Oral ETT  Additional Equipment: None  Intra-op Plan:   Post-operative Plan: Post-operative intubation/ventilation  Informed Consent:     History available from chart only  Plan Discussed with: CRNA and Surgeon  Anesthesia Plan Comments:         Anesthesia Quick Evaluation

## 2021-01-03 NOTE — Progress Notes (Signed)
PHARMACY NOTE:  ANTIMICROBIAL RENAL DOSAGE ADJUSTMENT  Current antimicrobial regimen includes a mismatch between antimicrobial dosage and estimated renal function.  As per policy approved by the Pharmacy & Therapeutics and Medical Executive Committees, the antimicrobial dosage will be adjusted accordingly.  Current antimicrobial dosage:  Zosyn 3.375 gm IV q 6 hrs  Indication: Necrotizing soft tissue infection  Renal Function: Estimated Creatinine Clearance: 62.5 mL/min (A) (by C-G formula based on SCr of 1.22 mg/dL (H)). []      On intermittent HD, scheduled: []      On CRRT  Antimicrobial dosage has been changed to:  Zosyn 3.375 gm IV q8 hrs (EI)  Additional comments: Now off CRRT this morning with plans for iHD in the next 24-48 hrs. Will monitor and adjust dose accordingly.  , PharmD, BCPS PGY2 Cardiology Pharmacy Resident Phone: [phone] 01/03/2021  11:28 AM  Please check [email or web address] for unit-specific pharmacy phone numbers.

## 2021-01-03 NOTE — Progress Notes (Addendum)
eLink Physician-Brief Progress Note Patient Name: [name] [name] DOB: 25-Dec-1970 MRN: [id number]   Date of Service  01/03/2021  HPI/Events of Note  Patient with sub-optimal sedation on 400 mcg of Fentanyl gtt and Ketamine gtt at 1.5, patient is trashing around violently and interfering with CRRT.  eICU Interventions  Versed 1-2 mg iv Q 1 hour ordered PRN severe agitation.        [name] [name] 01/03/2021, 12:31 AM

## 2021-01-03 NOTE — Progress Notes (Signed)
eLink Physician-Brief Progress Note Patient Name: [name] [name] DOB: December 31, 1970 MRN: [id number]   Date of Service  01/03/2021  HPI/Events of Note  Temp 100.5  eICU Interventions  PRN Tylenol ordered.        Thomasene Lot Benaiah Behan 01/03/2021, 11:31 PM

## 2021-01-03 NOTE — Progress Notes (Signed)
Patient ID: [name] [name], female   DOB: March 30, 1971, 50 y.o.   MRN: [id number] S:CRRT stopped this morning since she is going back to OR for another debridement. O:BP 136/71   Pulse (!) 112   Temp (!) 96 F (35.6 C) (Axillary)   Resp (!) 30   Ht 5' 2"  (1.575 m)   Wt 98.4 kg   SpO2 100%   BMI 39.68 kg/m   Intake/Output Summary (Last 24 hours) at 01/03/2021 1113 Last data filed at 01/03/2021 1046 Gross per 24 hour  Intake 3339.16 ml  Output 3596 ml  Net -256.84 ml   Intake/Output: I/O last 3 completed shifts: In: 5338.9 [I.V.:3048.4; NG/GT:1690; IV Piggyback:600.5] Out: 6263 [Urine:200; XNATF:5732; Stool:200]  Intake/Output this shift:  Total I/O In: 647.4 [I.V.:384.9; IV Piggyback:262.5] Out: 130 [Urine:30; Blood:100] Weight change: -0.6 kg KGU:[id number] and sedated SEG:BTDVV Resp: mechanical ventilated BS bilaterally Abd:+BS, soft Ext: no edema  Recent Labs  Lab 12/28/20 1641 12/28/20 2033 12/28/20 2326 12/29/20 0217 12/29/20 0253 12/29/20 1133 12/30/20 0420 12/30/20 1601 12/31/20 0330 12/31/20 0753 12/31/20 1600 01/01/21 0348 01/01/21 1530 01/02/21 0510 01/02/21 1545 01/03/21 0638  NA 131*   < > 132*   < > 135   < > 129* 130* 131* 137 130* 128* 128* 129*  130* 130* 132*  K 4.0   < > 3.8   < > 3.6   < > 3.5 3.8 3.6 3.7 3.8 3.9 4.2 4.5  4.6 4.5 4.3  CL 100  --  102  --  100   < > 93* 94* 97*  --  100 98 99 100  100 100 98  CO2 12*  --  16*  --  16*   < > 28 27 26   --  23 22 24 22  23 23 25   GLUCOSE 133*  --  200*  --  185*   < > 237* 236* 268*  --  173* 212* 163* 194*  194* 168* 69*  BUN 45*  --  43*  --  35*   < > 27* 26* 27*  --  28* 26* 23* 23*  23* 22* 20  CREATININE 5.97*  --  5.55*  --  4.66*   < > 2.58* 2.16* 1.92*  --  1.83* 1.62* 1.46* 1.43*  1.44* 1.36* 1.22*  ALBUMIN 1.6*  --  1.6*  --  1.4*   < > 1.1* 1.2* 1.1*  --  1.1* <1.0* 1.0* 1.1*  1.1* 1.0* 1.1*  CALCIUM 8.7*  --  8.2*  --  7.9*   < > 7.0* 7.4* 7.3*  --  7.3* 6.9* 7.2* 7.6*  7.5*  7.2* 7.5*  PHOS  --   --   --   --   --    < > 2.4* 1.9*  1.9*  --   --  3.7 3.6 3.3 3.2 3.3 2.8  AST 40  --  40  --  35  --  29  --  41  --   --   --   --  42*  --  38  ALT 19  --  20  --  19  --  14  --  19  --   --   --   --  21  --  21   < > = values in this interval not displayed.   Liver Function Tests: Recent Labs  Lab 12/31/20 0330 12/31/20 1600 01/02/21 0510 01/02/21 1545 01/03/21 0638  AST 41  --  42*  --  38  ALT 19  --  21  --  21  ALKPHOS 372*  --  408*  --  389*  BILITOT 2.7*  --  2.0*  --  1.6*  PROT 8.0  --  7.8  --  8.0  ALBUMIN 1.1*   < > 1.1*  1.1* 1.0* 1.1*   < > = values in this interval not displayed.   No results for input(s): LIPASE, AMYLASE in the last 168 hours. No results for input(s): AMMONIA in the last 168 hours. CBC: Recent Labs  Lab 12/29/20 0253 12/29/20 0253 12/30/20 0420 12/31/20 0330 12/31/20 0753 01/02/21 0510 01/03/21 0638  WBC 60.9*  --  44.0* 35.1*  --  23.5* 15.4*  NEUTROABS  --    < > 38.4* 29.9*  --  19.1* 11.5*  HGB 11.1*  --  9.8* 9.8* 9.9* 7.3* 8.7*  HCT 31.1*  --  27.2* 27.7* 29.0* 22.2* 26.3*  MCV 90.4  --  87.5 90.2  --  94.9 94.9  PLT 156  --  149* 144*  --  PLATELET CLUMPS NOTED ON SMEAR, UNABLE TO ESTIMATE PLATELET CLUMPS NOTED ON SMEAR, COUNT APPEARS DECREASED   < > = values in this interval not displayed.   Cardiac Enzymes: No results for input(s): CKTOTAL, CKMB, CKMBINDEX, TROPONINI in the last 168 hours. CBG: Recent Labs  Lab 01/02/21 1904 01/02/21 2304 01/03/21 0309 01/03/21 0710 01/03/21 0745  GLUCAP 156* 145* 99 50* 137*    Iron Studies: No results for input(s): IRON, TIBC, TRANSFERRIN, FERRITIN in the last 72 hours. Studies/Results: DG Chest Port 1 View  Result Date: 01/03/2021 CLINICAL DATA:  Respiratory failure. EXAM: PORTABLE CHEST 1 VIEW COMPARISON:  CT 12/31/2020.  12/30/2020. FINDINGS: Endotracheal tube, NG tube, right IJ line stable position. Stable cardiomegaly. Mild bibasilar atelectasis  again noted. No pleural effusion or pneumothorax. IMPRESSION: 1.  Lines and tubes in stable position. 2. Stable cardiomegaly. Mild bibasilar atelectasis. Chest is stable from prior exam. Electronically Signed   By: Marcello Moores  Register   On: 01/03/2021 07:46   ECHOCARDIOGRAM COMPLETE  Result Date: 01/02/2021    ECHOCARDIOGRAM REPORT   Patient Name:   [name] [name] Date of Exam: 01/02/2021 Medical Rec #:  [id number]       Height:       62.0 in Accession #:    [id number]      Weight:       218.3 lb Date of Birth:  1971/06/04       BSA:          1.984 m Patient Age:    16 years        BP:           102/72 mmHg Patient Gender: F               HR:           100 bpm. Exam Location:  Inpatient Procedure: 2D Echo, Color Doppler, Cardiac Doppler and Intracardiac            Opacification Agent Indications:    Dyspnea  History:        Patient has no prior history of Echocardiogram examinations.                 Risk Factors:Current Smoker, Dyslipidemia and Diabetes.  Sonographer:    Bernadene Person RDCS Referring Phys: [place]  1. Left ventricular ejection fraction, by estimation, is 45 to 50%. The left  ventricle has mildly decreased function. The left ventricle demonstrates global hypokinesis. Left ventricular diastolic parameters were normal.  2. Right ventricular systolic function is normal. The right ventricular size is normal. There is normal pulmonary artery systolic pressure.  3. The mitral valve is normal in structure. Mild mitral valve regurgitation. No evidence of mitral stenosis.  4. The aortic valve is normal in structure. Aortic valve regurgitation is not visualized. No aortic stenosis is present.  5. The inferior vena cava is normal in size with greater than 50% respiratory variability, suggesting right atrial pressure of 3 mmHg. FINDINGS  Left Ventricle: Left ventricular ejection fraction, by estimation, is 45 to 50%. The left ventricle has mildly decreased function. The left ventricle  demonstrates global hypokinesis. Definity contrast agent was given IV to delineate the left ventricular  endocardial borders. The left ventricular internal cavity size was normal in size. There is no left ventricular hypertrophy. Left ventricular diastolic parameters were normal. Right Ventricle: The right ventricular size is normal. No increase in right ventricular wall thickness. Right ventricular systolic function is normal. There is normal pulmonary artery systolic pressure. The tricuspid regurgitant velocity is 1.61 m/s, and  with an assumed right atrial pressure of 3 mmHg, the estimated right ventricular systolic pressure is 87.8 mmHg. Left Atrium: Left atrial size was normal in size. Right Atrium: Right atrial size was normal in size. Pericardium: There is no evidence of pericardial effusion. Mitral Valve: The mitral valve is normal in structure. Mild mitral valve regurgitation. No evidence of mitral valve stenosis. Tricuspid Valve: The tricuspid valve is normal in structure. Tricuspid valve regurgitation is mild . No evidence of tricuspid stenosis. Aortic Valve: The aortic valve is normal in structure. Aortic valve regurgitation is not visualized. No aortic stenosis is present. Pulmonic Valve: The pulmonic valve was normal in structure. Pulmonic valve regurgitation is not visualized. No evidence of pulmonic stenosis. Aorta: The aortic root is normal in size and structure. Venous: The inferior vena cava is normal in size with greater than 50% respiratory variability, suggesting right atrial pressure of 3 mmHg. IAS/Shunts: No atrial level shunt detected by color flow Doppler.  LEFT VENTRICLE PLAX 2D LVIDd:         5.50 cm LVIDs:         4.40 cm LV PW:         1.00 cm LV IVS:        0.90 cm LVOT diam:     1.80 cm LV SV:         41 LV SV Index:   20 LVOT Area:     2.54 cm  LEFT ATRIUM             Index       RIGHT ATRIUM           Index LA diam:        3.80 cm 1.92 cm/m  RA Area:     14.00 cm LA Vol (A2C):    53.0 ml 26.71 ml/m RA Volume:   30.90 ml  15.57 ml/m LA Vol (A4C):   47.4 ml 23.89 ml/m LA Biplane Vol: 53.0 ml 26.71 ml/m  AORTIC VALVE LVOT Vmax:   103.17 cm/s LVOT Vmean:  65.633 cm/s LVOT VTI:    0.160 m  AORTA Ao Root diam: 2.80 cm Ao Asc diam:  2.90 cm TRICUSPID VALVE TR Peak grad:   10.4 mmHg TR Vmax:        161.00 cm/s  SHUNTS Systemic VTI:  0.16  m Systemic Diam: 1.80 cm Candee Furbish MD Electronically signed by Candee Furbish MD Signature Date/Time: 01/02/2021/4:06:25 PM    Final     B-complex with vitamin C  1 tablet Per Tube Daily   chlorhexidine gluconate (MEDLINE KIT)  15 mL Mouth Rinse BID   Chlorhexidine Gluconate Cloth  6 each Topical Q0600   clonazepam  1 mg Per Tube TID   docusate  100 mg Per Tube BID   feeding supplement (PROSource TF)  45 mL Per Tube QID   heparin  5,000 Units Subcutaneous Q8H   insulin aspart  0-20 Units Subcutaneous Q4H   insulin aspart  3 Units Subcutaneous Q4H   insulin detemir  10 Units Subcutaneous BID   mouth rinse  15 mL Mouth Rinse 10 times per day   metoprolol tartrate  2.5 mg Intravenous Q6H   oxyCODONE  10 mg Per Tube Q4H   pantoprazole sodium  40 mg Per Tube Daily   polyethylene glycol  17 g Per Tube Daily   QUEtiapine  25 mg Per Tube QHS   sodium chloride flush  10-40 mL Intracatheter Q12H   sodium hypochlorite   Irrigation Once   sodium hypochlorite   Irrigation BID    BMET    Component Value Date/Time   NA 132 (L) 01/03/2021 0638   K 4.3 01/03/2021 0638   CL 98 01/03/2021 0638   CO2 25 01/03/2021 0638   GLUCOSE 69 (L) 01/03/2021 0638   BUN 20 01/03/2021 0638   CREATININE 1.22 (H) 01/03/2021 0638   CALCIUM 7.5 (L) 01/03/2021 0638   GFRNONAA 55 (L) 01/03/2021 0638   CBC    Component Value Date/Time   WBC 15.4 (H) 01/03/2021 0638   RBC 2.77 (L) 01/03/2021 0638   HGB 8.7 (L) 01/03/2021 0638   HCT 26.3 (L) 01/03/2021 0638   PLT  01/03/2021 0638    PLATELET CLUMPS NOTED ON SMEAR, COUNT APPEARS DECREASED   MCV 94.9 01/03/2021 0638    MCH 31.4 01/03/2021 0638   MCHC 33.1 01/03/2021 0638   RDW 14.7 01/03/2021 0638   LYMPHSABS 2.2 01/03/2021 0638   MONOABS 1.3 (H) 01/03/2021 0638   EOSABS 0.1 01/03/2021 0638   BASOSABS 0.1 01/03/2021 3220      Assessment/Plan:   AKI, oliguric/anuric - presumably ischemic ATN in setting of severe sepsis syndrome and hypotension requiring multiple pressors.  She was started on CRRT 12/28/20 vir RIJ HD catheter placed by PCCM.  Remains anuric and on CRRT.   All fluids 4K/2.5Ca, pre and post filter fluids at 400 ml/hr, dialysate at 1500 ml/hr, no anticoagulation. Keep even. Off of pressors so will not resume CRRT today and transition to IHD in the next 24 to 48 hours depending on labs. Starting to make some urine but still oliguric 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 - currently off levophed, s/p debridement on 12/28/20 and again on 12/31/20.  Will likely need another per surgery. VDRF - per PCCM Atrial flutter with RVR:  Cardiology following.  On amiodarone and off levophed. DM type 2 - per primary Acute metabolic encephalopathy Anemia of critical illness - transfuse prn. Hypophosphatemia - repleted IV and follow.    Donetta Potts, MD [employer] [phone]

## 2021-01-03 NOTE — Progress Notes (Deleted)
NAME:  [name] [name], MRN:  [id number], DOB:  30-Sep-1970, LOS: 6 ADMISSION DATE:  12/28/2020, CONSULTATION DATE:  6/3 REFERRING MD:  [facility] MD, CHIEF COMPLAINT:  Fever, pain and ulceration  History of Present Illness:  50 y/o female initially admitted to [place] on 5/29 complaining of fever and a buttock abscess, moved to [facility] on 6/3 in the setting of worsening renal failure, acidosis, encephaloapthy with known group B strep wound culture.  Pertinent  Medical History  Recent Vulvar irritation and Bartholin Cyst seen at [facility]  HTN DM type II Cholecystectomy  GERD Anxiety  Significant Hospital Events: Including procedures, antibiotic start and stop dates in addition to other pertinent events   5/29 admitted to [facility] sepsis felt 2/2 cellulitis on buttocks. Cultures sent. CT abd/pelvis + [place] gas but no drainable abscess. Started on Vanc. IVFs. Wound also cultured.  6/1 wound culture back + group B strep. 1 of 4 blood cultures + GPC. ABX changed to rocephin 6/3 Arrived at  [facility] from [place] w/ new hypotension, worsening renal failure, worsening leukocytosis, poorly controlled pain and new encephalopathy. Admitted to ICU. Started on zyvox and meropenem. Surgical consult requested> to OR for debridement 6/3 started CRRT 6/3 meropenem > 6/5 6/3 linezolid >  6/4 IVIg >6/6 6/6 zosyn >  6/6 back to OR for debridement, in and out of atrial flutter; QTc 590; cardiology consult> amiodarone 6/7 off vasopressors, continues to have atrial flutter, started ketamine 6/8 started on metoprolol, echo with LVEF 45-50%.   Interim History / Subjective:  Overnight - Patient was agitated and was given Versed 1-2 mg iv Off vasopressors Patient is going to the OR for debridement.  Will attempt to Wean patient when she returns form OR.    Objective   Blood pressure 136/71, pulse (!) 112, temperature (!) 96 F (35.6 C), temperature source Axillary, resp. rate (!) 30, height 5\' 2"  (1.575 m),  weight 98.4 kg, SpO2 100 %.    Vent Mode: PRVC FiO2 (%):  [30 %] 30 % Set Rate:  [30 bmp] 30 bmp Vt Set:  [400 mL] 400 mL PEEP:  [5 cmH20] 5 cmH20 Pressure Support:  [8 cmH20] 8 cmH20 Plateau Pressure:  [15 cmH20-16 cmH20] 15 cmH20   Intake/Output Summary (Last 24 hours) at 01/03/2021 1017 Last data filed at 01/03/2021 0955 Gross per 24 hour  Intake 3314.46 ml  Output 3867 ml  Net -552.54 ml   Filed Weights   01/01/21 0500 01/02/21 0500 01/03/21 0408  Weight: 100.1 kg 99 kg 98.4 kg    Examination:  General:  In bed on vent HENT: NCAT ETT in place PULM: CTA B, vent supported breathing CV: RRR, no mgr GI: BS+, soft, nontender MSK: normal bulk and tone Neuro: sedated on vent.   Labs/imaging that I havepersonally reviewed  (right click and "Reselect all SmartList Selections" daily)  WBC 15.4-->23.5 K yesterday Na 132 --> 129 yesterday Cr - 1.22 --> 1.36 yesterday WBC -15.4 --> 23.5 yesterday Hgb - 8.7 --> 7.3 yesterday   Resolved Hospital Problem list     Assessment & Plan:   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, s/p wound exploration and debridement on 6/3 Back to OR today Continue zosyn Monitor; off levophed  Oliguric AKI Continue CRRT Monitor BMET and UOP Replace electrolytes as needed Remove volume slowly  Atrial flutter Acute systolic and diastolic heart failure Tele Continue metoprolol Continue amiodarone Appreciate cardiology 6/8 LVEF 45-50%.  Acute respiratory failure with hypoxemia Full mechanical vent support VAP  prevention Daily WUA/SBT Wean after back from OR Added Seroquel for agitation at night   Mild normocytic anemia without bleeding Monitor for bleeding  6/9 Hemoglobin of 8.7 Transfuse PRBC for Hgb < 7 gm/dL  DM2 with hyperglycemia SSI Glucose is 137 ; target between 140 - < 200 to keeping blood glucose levels high enough to avoid hypoglycemia. Tube feeding to continue Levmir 10 units and TF 3-4 units   Acute  metabolic encephalopathy Baseline narcotic dependence, high pain medication needs this admission Need for sedation for mechanical ventilation RASS goal -2 Continue ketamine and fentanyl  PAD protocol  Best practice (right click and "Reselect all SmartList Selections" daily)  Diet:  Tube Feed  Pain/Anxiety/Delirium protocol (if indicated): Yes (RASS goal -2) VAP protocol (if indicated): Yes DVT prophylaxis: Subcutaneous Heparin GI prophylaxis: H2B Glucose control:  SSI Yes Central venous access:  Yes, and it is still needed Arterial line:  Yes, and it is still needed Foley:  Yes, and it is still needed Mobility:  bed rest  PT consulted: Yes Last date of multidisciplinary goals of care discussion [called her sister for an update on 6/5, have not had goals of care conversation at this point] Code Status:  full code Disposition: remain in ICU  Labs   CBC: Recent Labs  Lab 12/29/20 0253 12/30/20 0420 12/31/20 0330 12/31/20 0753 01/02/21 0510 01/03/21 0638  WBC 60.9* 44.0* 35.1*  --  23.5* 15.4*  NEUTROABS  --  38.4* 29.9*  --  19.1* 11.5*  HGB 11.1* 9.8* 9.8* 9.9* 7.3* 8.7*  HCT 31.1* 27.2* 27.7* 29.0* 22.2* 26.3*  MCV 90.4 87.5 90.2  --  94.9 94.9  PLT 156 149* 144*  --  PLATELET CLUMPS NOTED ON SMEAR, UNABLE TO ESTIMATE PLATELET CLUMPS NOTED ON SMEAR, COUNT APPEARS DECREASED    Basic Metabolic Panel: Recent Labs  Lab 12/30/20 1601 12/31/20 0330 12/31/20 0753 01/01/21 0348 01/01/21 1530 01/02/21 0510 01/02/21 1545 01/03/21 0638  NA 130* 131*   < > 128* 128* 129*  130* 130* 132*  K 3.8 3.6   < > 3.9 4.2 4.5  4.6 4.5 4.3  CL 94* 97*   < > 98 99 100  100 100 98  CO2 27 26   < > 22 24 22  23 23 25   GLUCOSE 236* 268*   < > 212* 163* 194*  194* 168* 69*  BUN 26* 27*   < > 26* 23* 23*  23* 22* 20  CREATININE 2.16* 1.92*   < > 1.62* 1.46* 1.43*  1.44* 1.36* 1.22*  CALCIUM 7.4* 7.3*   < > 6.9* 7.2* 7.6*  7.5* 7.2* 7.5*  MG 2.3 2.3  --  2.1  --  2.2  --  2.1   PHOS 1.9*  1.9*  --    < > 3.6 3.3 3.2 3.3 2.8   < > = values in this interval not displayed.   GFR: Estimated Creatinine Clearance: 62.5 mL/min (A) (by C-G formula based on SCr of 1.22 mg/dL (H)). Recent Labs  Lab 12/28/20 1641 12/28/20 1824 12/28/20 2326 12/29/20 0253 12/30/20 0420 12/31/20 0330 01/02/21 0510 01/03/21 03/05/21  PROCALCITON 17.91  --   --   --   --   --   --   --   WBC 52.3*  --  77.5*   < > 44.0* 35.1* 23.5* 15.4*  LATICACIDVEN 3.3* 3.4* 5.1*  --   --   --   --   --    < > =  values in this interval not displayed.    Liver Function Tests: Recent Labs  Lab 12/29/20 0253 12/29/20 1824 12/30/20 0420 12/30/20 1601 12/31/20 0330 12/31/20 1600 01/01/21 0348 01/01/21 1530 01/02/21 0510 01/02/21 1545 01/03/21 0638  AST 35  --  29  --  41  --   --   --  42*  --  38  ALT 19  --  14  --  19  --   --   --  21  --  21  ALKPHOS 353*  --  299*  --  372*  --   --   --  408*  --  389*  BILITOT 6.0*  --  3.8*  --  2.7*  --   --   --  2.0*  --  1.6*  PROT 5.4*  --  7.3  --  8.0  --   --   --  7.8  --  8.0  ALBUMIN 1.4*   < > 1.1*   < > 1.1*   < > <1.0* 1.0* 1.1*  1.1* 1.0* 1.1*   < > = values in this interval not displayed.   No results for input(s): LIPASE, AMYLASE in the last 168 hours. No results for input(s): AMMONIA in the last 168 hours.  ABG    Component Value Date/Time   PHART 7.497 (H) 12/31/2020 0753   PCO2ART 36.3 12/31/2020 0753   PO2ART 144 (H) 12/31/2020 0753   HCO3 28.2 (H) 12/31/2020 0753   TCO2 29 12/31/2020 0753   ACIDBASEDEF 6.0 (H) 12/29/2020 0217   O2SAT 99.0 12/31/2020 0753     Coagulation Profile: Recent Labs  Lab 12/28/20 1641 12/28/20 2326  INR 1.3* 1.5*    Cardiac Enzymes: No results for input(s): CKTOTAL, CKMB, CKMBINDEX, TROPONINI in the last 168 hours.  HbA1C: Hgb A1c MFr Bld  Date/Time Value Ref Range Status  12/28/2020 06:48 PM 11.1 (H) 4.8 - 5.6 % Final    Comment:    (NOTE)         Prediabetes: 5.7 - 6.4          Diabetes: >6.4         Glycemic control for adults with diabetes: <7.0     CBG: Recent Labs  Lab 01/02/21 1904 01/02/21 2304 01/03/21 0309 01/03/21 0710 01/03/21 0745  GLUCAP 156* 145* 99 50* 137*   Dennie Bible MS4

## 2021-01-03 NOTE — Progress Notes (Signed)
Attending:  Discussed with MS4    Subjective: Received versed some overnight Remains out of atrial flutter   Objective: Vitals:   01/03/21 0701 01/03/21 0702 01/03/21 0730 01/03/21 0800  BP: 94/68  136/71   Pulse: 88 88 (!) 104 (!) 112  Resp:      Temp:  (!) 96 F (35.6 C)    TempSrc:  Axillary    SpO2: 100% 100% 100% 100%  Weight:      Height:       Vent Mode: PRVC FiO2 (%):  [30 %] 30 % Set Rate:  [30 bmp] 30 bmp Vt Set:  [400 mL] 400 mL PEEP:  [5 cmH20] 5 cmH20 Pressure Support:  [8 cmH20] 8 cmH20 Plateau Pressure:  [15 cmH20-16 cmH20] 15 cmH20  Intake/Output Summary (Last 24 hours) at 01/03/2021 0914 Last data filed at 01/03/2021 0800 Gross per 24 hour  Intake 3199.81 ml  Output 3914 ml  Net -714.19 ml    General:  In bed on vent HENT: NCAT ETT in place PULM: CTA B, vent supported breathing CV: RRR, no mgr GI: BS+, soft, nontender MSK: normal bulk and tone Neuro: sedated on vent   6/8 TTE> LVEF 45-50%   CBC    Component Value Date/Time   WBC 15.4 (H) 01/03/2021 0638   RBC 2.77 (L) 01/03/2021 0638   HGB 8.7 (L) 01/03/2021 0638   HCT 26.3 (L) 01/03/2021 0638   PLT  01/03/2021 0638    PLATELET CLUMPS NOTED ON SMEAR, COUNT APPEARS DECREASED   MCV 94.9 01/03/2021 0638   MCH 31.4 01/03/2021 0638   MCHC 33.1 01/03/2021 0638   RDW 14.7 01/03/2021 0638   LYMPHSABS 2.2 01/03/2021 0638   MONOABS 1.3 (H) 01/03/2021 0638   EOSABS 0.1 01/03/2021 0638   BASOSABS 0.1 01/03/2021 0638    BMET    Component Value Date/Time   NA 132 (L) 01/03/2021 0638   K 4.3 01/03/2021 0638   CL 98 01/03/2021 0638   CO2 25 01/03/2021 0638   GLUCOSE 69 (L) 01/03/2021 0638   BUN 20 01/03/2021 0638   CREATININE 1.22 (H) 01/03/2021 0638   CALCIUM 7.5 (L) 01/03/2021 0638   GFRNONAA 55 (L) 01/03/2021 0638    CXR images personally reviewed, atelectasis left lung, ETT in place, cvl in place  Impression/Plan: Atrial flutter > continue metoprolol, telemetry, could change  metoprolol to via tube today, continue amiodarone Acute respiratory failure with hypoxemia> continue full vent support, but pressure control/wean after OR, VAP prevention AKI>holding CRRT, f/u with renal, continue to check labs Necrotizing abscess buttock> back to OR for debridement today, continue zosyn, off linezolid Acute metabolic encephalopathy complicated by narcotic dependence> increase oxycodone to 10mg  qh4, add seroquel 25mg  qHS tonight,  Hyperglycemia> adjust insulin if she remains off CRRT today   My cc time 35 minutes  , MD [place] [facility] Pager: [phone] Cell: [phone] After 3pm or if no response, call [phone]

## 2021-01-03 NOTE — Progress Notes (Signed)
[place] for Infectious Disease  Date of Admission:  12/28/2020           Reason for visit: Follow up on necrotizing soft tissue infection  Current antibiotics: Pip-tazo 6/6--present     Previous antibiotics: Meropenem 6/3--6/6 Linezolid 6/3--6/8  ASSESSMENT:    Polymicrobial necrotizing skin and soft tissue infection of the left buttock/posterior thigh with TSS: Status postdebridement 6/3, 6/6, and 6/9.  Intraoperative cultures with GBS, E. coli, and Prevotella.  Currently on piperacillin tazobactam monotherapy after completing 3 days of IVIG and a course of linezolid for antitoxin effect in the setting of toxic shock syndrome. Septic shock: Secondary to #1 with improved hemodynamics.  Currently off Levophed. Severe leukocytosis: Improving and secondary to #1 AKI: Was previously on CRRT.  Nephrology is following and will consider transition to intermittent HD in the next 24 to 48 hours depending on her progress. Ventilatory dependent respiratory failure: Remains intubated on minimal vent settings Diabetes, obesity Atrial flutter with RVR: Cardiology following  PLAN:    Continue piperacillin tazobactam which covers bacteria isolated thus far.  Her dose has been adjusted for renal function today by pharmacy Follow-up further possible surgical debridements Wound care, glycemic control Rest of care per primary team and other specialists   Principal Problem:   Necrotizing soft tissue infection Active Problems:   Septic shock ([place])   Sepsis due to Streptococcus, group B ([place])   Acute kidney injury ([place])   Obesity   Type 2 diabetes mellitus with hyperglycemia ([place])   Acute hypercapnic respiratory failure ([place])   Typical atrial flutter ([place])    MEDICATIONS:    Scheduled Meds: . B-complex with vitamin C  1 tablet Per Tube Daily  . chlorhexidine gluconate (MEDLINE KIT)  15 mL Mouth Rinse BID  . Chlorhexidine Gluconate Cloth  6 each Topical Q0600  . clonazepam  1  mg Per Tube TID  . docusate  100 mg Per Tube BID  . feeding supplement (PROSource TF)  45 mL Per Tube QID  . heparin  5,000 Units Subcutaneous Q8H  . insulin aspart  0-20 Units Subcutaneous Q4H  . insulin aspart  3 Units Subcutaneous Q4H  . insulin detemir  10 Units Subcutaneous BID  . mouth rinse  15 mL Mouth Rinse 10 times per day  . metoprolol tartrate  2.5 mg Intravenous Q6H  . oxyCODONE  10 mg Per Tube Q4H  . pantoprazole sodium  40 mg Per Tube Daily  . polyethylene glycol  17 g Per Tube Daily  . QUEtiapine  25 mg Per Tube QHS  . sodium chloride flush  10-40 mL Intracatheter Q12H  . sodium hypochlorite   Irrigation Once  . sodium hypochlorite   Irrigation BID   Continuous Infusions: . sodium chloride Stopped (01/01/21 2325)  . amiodarone 30 mg/hr (01/03/21 1200)  . feeding supplement (VITAL 1.5 CAL) 1,000 mL (01/02/21 1541)  . fentaNYL infusion INTRAVENOUS 200 mcg/hr (01/03/21 0939)  . ketamine (KETALAR) Adult IV Infusion 1.5 mg/kg/hr (01/03/21 1103)  . norepinephrine (LEVOPHED) Adult infusion 10 mcg/min (01/03/21 0923)  . piperacillin-tazobactam (ZOSYN)  IV     PRN Meds:.fentaNYL, levalbuterol, midazolam, ondansetron (ZOFRAN) IV, polyethylene glycol, sodium chloride flush  SUBJECTIVE:   24 hour events:  Agitation noted overnight Afebrile Improved hemodynamics Back to the OR today No new cultures Improving leukocytosis   Review of Systems  Unable to perform ROS: Intubated     OBJECTIVE:   Blood pressure 121/76, pulse (!) 112, temperature 98.3  F (36.8 C), resp. rate (!) 30, height _0  (1.575 m), weight 98.4 kg, SpO2 100 %. Body mass index is 39.68 kg/m.  Physical Exam Constitutional:      Comments: Chronically ill-appearing woman, lying in bed, on the ventilator  HENT:     Head: Normocephalic and atraumatic.  Neck:     Comments: ET tube in place Right HD IJ line Pulmonary:     Comments: Ventilated breath sounds, diminished at the bases Abdominal:      General: Abdomen is flat.     Palpations: Abdomen is soft.     Tenderness: There is no abdominal tenderness.  Musculoskeletal:     Right lower leg: No edema.     Left lower leg: No edema.  Skin:    General: Skin is warm and dry.     Findings: No rash.  Neurological:     Comments: Sedated, able to wiggle her toes on command     Lab Results: Lab Results  Component Value Date   WBC 15.4 (H) 01/03/2021   HGB 8.7 (L) 01/03/2021   HCT 26.3 (L) 01/03/2021   MCV 94.9 01/03/2021   PLT  01/03/2021    PLATELET CLUMPS NOTED ON SMEAR, COUNT APPEARS DECREASED    Lab Results  Component Value Date   NA 132 (L) 01/03/2021   K 4.3 01/03/2021   CO2 25 01/03/2021   GLUCOSE 69 (L) 01/03/2021   BUN 20 01/03/2021   CREATININE 1.22 (H) 01/03/2021   CALCIUM 7.5 (L) 01/03/2021   GFRNONAA 55 (L) 01/03/2021    Lab Results  Component Value Date   ALT 21 01/03/2021   AST 38 01/03/2021   ALKPHOS 389 (H) 01/03/2021   BILITOT 1.6 (H) 01/03/2021    No results found for: CRP  No results found for: ESRSEDRATE   I have reviewed the micro and lab results in Epic.  Imaging: DG Chest Port 1 View  Result Date: 01/03/2021 CLINICAL DATA:  Respiratory failure. EXAM: PORTABLE CHEST 1 VIEW COMPARISON:  CT 12/31/2020.  12/30/2020. FINDINGS: Endotracheal tube, NG tube, right IJ line stable position. Stable cardiomegaly. Mild bibasilar atelectasis again noted. No pleural effusion or pneumothorax. IMPRESSION: 1.  Lines and tubes in stable position. 2. Stable cardiomegaly. Mild bibasilar atelectasis. Chest is stable from prior exam. Electronically Signed   By: Marcello Moores  Register   On: 01/03/2021 07:46   ECHOCARDIOGRAM COMPLETE  Result Date: 01/02/2021    ECHOCARDIOGRAM REPORT   Patient Name:   [name] [name] Date of Exam: 01/02/2021 Medical Rec #:  [id number]       Height:       62.0 in Accession #:    [id number]      Weight:       218.3 lb Date of Birth:  [birth year]       BSA:          1.984 m Patient Age:    50  years        BP:           102/72 mmHg Patient Gender: F               HR:           100 bpm. Exam Location:  Inpatient Procedure: 2D Echo, Color Doppler, Cardiac Doppler and Intracardiac            Opacification Agent Indications:    Dyspnea  History:        Patient has no prior history of Echocardiogram  examinations.                 Risk Factors:Current Smoker, Dyslipidemia and Diabetes.  Sonographer:    Bernadene Person RDCS Referring Phys: [place]  1. Left ventricular ejection fraction, by estimation, is 45 to 50%. The left ventricle has mildly decreased function. The left ventricle demonstrates global hypokinesis. Left ventricular diastolic parameters were normal.  2. Right ventricular systolic function is normal. The right ventricular size is normal. There is normal pulmonary artery systolic pressure.  3. The mitral valve is normal in structure. Mild mitral valve regurgitation. No evidence of mitral stenosis.  4. The aortic valve is normal in structure. Aortic valve regurgitation is not visualized. No aortic stenosis is present.  5. The inferior vena cava is normal in size with greater than 50% respiratory variability, suggesting right atrial pressure of 3 mmHg. FINDINGS  Left Ventricle: Left ventricular ejection fraction, by estimation, is 45 to 50%. The left ventricle has mildly decreased function. The left ventricle demonstrates global hypokinesis. Definity contrast agent was given IV to delineate the left ventricular  endocardial borders. The left ventricular internal cavity size was normal in size. There is no left ventricular hypertrophy. Left ventricular diastolic parameters were normal. Right Ventricle: The right ventricular size is normal. No increase in right ventricular wall thickness. Right ventricular systolic function is normal. There is normal pulmonary artery systolic pressure. The tricuspid regurgitant velocity is 1.61 m/s, and  with an assumed right atrial pressure of  3 mmHg, the estimated right ventricular systolic pressure is 70.4 mmHg. Left Atrium: Left atrial size was normal in size. Right Atrium: Right atrial size was normal in size. Pericardium: There is no evidence of pericardial effusion. Mitral Valve: The mitral valve is normal in structure. Mild mitral valve regurgitation. No evidence of mitral valve stenosis. Tricuspid Valve: The tricuspid valve is normal in structure. Tricuspid valve regurgitation is mild . No evidence of tricuspid stenosis. Aortic Valve: The aortic valve is normal in structure. Aortic valve regurgitation is not visualized. No aortic stenosis is present. Pulmonic Valve: The pulmonic valve was normal in structure. Pulmonic valve regurgitation is not visualized. No evidence of pulmonic stenosis. Aorta: The aortic root is normal in size and structure. Venous: The inferior vena cava is normal in size with greater than 50% respiratory variability, suggesting right atrial pressure of 3 mmHg. IAS/Shunts: No atrial level shunt detected by color flow Doppler.  LEFT VENTRICLE PLAX 2D LVIDd:         5.50 cm LVIDs:         4.40 cm LV PW:         1.00 cm LV IVS:        0.90 cm LVOT diam:     1.80 cm LV SV:         41 LV SV Index:   20 LVOT Area:     2.54 cm  LEFT ATRIUM             Index       RIGHT ATRIUM           Index LA diam:        3.80 cm 1.92 cm/m  RA Area:     14.00 cm LA Vol (A2C):   53.0 ml 26.71 ml/m RA Volume:   30.90 ml  15.57 ml/m LA Vol (A4C):   47.4 ml 23.89 ml/m LA Biplane Vol: 53.0 ml 26.71 ml/m  AORTIC VALVE LVOT Vmax:   103.17 cm/s  LVOT Vmean:  65.633 cm/s LVOT VTI:    0.160 m  AORTA Ao Root diam: 2.80 cm Ao Asc diam:  2.90 cm TRICUSPID VALVE TR Peak grad:   10.4 mmHg TR Vmax:        161.00 cm/s  SHUNTS Systemic VTI:  0.16 m Systemic Diam: 1.80 cm Candee Furbish MD Electronically signed by Candee Furbish MD Signature Date/Time: 01/02/2021/4:06:25 PM    Final      Imaging  independently reviewed in Epic.    Raynelle Highland  for Infectious Disease [facility] [phone] pager 01/03/2021, 12:54 PM

## 2021-01-03 NOTE — Progress Notes (Signed)
Progress Note  Patient Name: [name] [name] Date of Encounter: 01/03/2021  [facility] Cardiologist: Skeet Latch, MD (new)  Subjective   Unable to assess.  Intubated and sedated.  Inpatient Medications    Scheduled Meds:  B-complex with vitamin C  1 tablet Per Tube Daily   chlorhexidine gluconate (MEDLINE KIT)  15 mL Mouth Rinse BID   Chlorhexidine Gluconate Cloth  6 each Topical Q0600   docusate  100 mg Per Tube BID   feeding supplement (PROSource TF)  45 mL Per Tube QID   heparin  5,000 Units Subcutaneous Q8H   insulin aspart  0-20 Units Subcutaneous Q4H   insulin aspart  5 Units Subcutaneous Q4H   insulin detemir  20 Units Subcutaneous BID   mouth rinse  15 mL Mouth Rinse 10 times per day   metoprolol tartrate  2.5 mg Intravenous Q6H   oxyCODONE  10 mg Per Tube Q8H   pantoprazole sodium  40 mg Per Tube Daily   polyethylene glycol  17 g Per Tube Daily   sodium chloride flush  10-40 mL Intracatheter Q12H   sodium hypochlorite   Irrigation BID   Continuous Infusions:   prismasol BGK 4/2.5 400 mL/hr at 01/02/21 1941    prismasol BGK 4/2.5 400 mL/hr at 01/03/21 0115   sodium chloride Stopped (01/01/21 2325)   amiodarone 30 mg/hr (01/03/21 0800)   feeding supplement (VITAL 1.5 CAL) 1,000 mL (01/02/21 1541)   fentaNYL infusion INTRAVENOUS 300 mcg/hr (01/03/21 0800)   ketamine (KETALAR) Adult IV Infusion 1.5 mg/kg/hr (01/03/21 0800)   norepinephrine (LEVOPHED) Adult infusion 4 mcg/min (01/02/21 0600)   piperacillin-tazobactam (ZOSYN)  IV 12.5 mL/hr at 01/03/21 0700   prismasol BGK 4/2.5 1,500 mL/hr at 01/03/21 0051   PRN Meds: fentaNYL, heparin, heparin, levalbuterol, midazolam, ondansetron (ZOFRAN) IV, polyethylene glycol, sodium chloride flush   Vital Signs    Vitals:   01/03/21 0701 01/03/21 0702 01/03/21 0730 01/03/21 0800  BP: 94/68  136/71   Pulse: 88 88 (!) 104 (!) 112  Resp:      Temp:  (!) 96 F (35.6 C)    TempSrc:  Axillary    SpO2: 100% 100%  100% 100%  Weight:      Height:        Intake/Output Summary (Last 24 hours) at 01/03/2021 0848 Last data filed at 01/03/2021 0800 Gross per 24 hour  Intake 3338.82 ml  Output 4064 ml  Net -725.18 ml   Last 3 Weights 01/03/2021 01/02/2021 01/01/2021  Weight (lbs) 216 lb 14.9 oz 218 lb 4.1 oz 220 lb 10.9 oz  Weight (kg) 98.4 kg 99 kg 100.1 kg      Telemetry    Atrial flutter.  Rates 90-110s.- Personally Reviewed  ECG    01/01/2021: Atrial flutter.  Rate 122 bpm.  2-1 AV conduction. - Personally Reviewed  Physical Exam   VS:  BP 136/71   Pulse (!) 112   Temp (!) 96 F (35.6 C) (Axillary)   Resp (!) 30   Ht 5' 2"  (1.575 m)   Wt 98.4 kg   SpO2 100%   BMI 39.68 kg/m  , BMI Body mass index is 39.68 kg/m. GENERAL: Critically ill-appearing.  Intubated and sedated. HEENT: Pupils equal round and reactive, fundi not visualized, oral mucosa unremarkable NECK:  No jugular venous distention, waveform within normal limits, carotid upstroke brisk and symmetric, no bruits LUNGS:  Clear to auscultation bilaterally HEART: Tachycardic.  Regular rhythm. PMI not displaced or sustained,S1 and S2  within normal limits, no S3, no S4, no clicks, no rubs, no murmurs ABD:  Flat, positive bowel sounds normal in frequency in pitch, no bruits, no rebound, no guarding, no midline pulsatile mass, no hepatomegaly, no splenomegaly EXT:  2 plus pulses throughout, no edema, no cyanosis no clubbing SKIN:  No rashes no nodules NEURO: Opens eyes to touch and voice but does not localize.  Moves all extremities. PSYCH:  Cognitively intact, oriented to person place and time   Labs    High Sensitivity Troponin:   Recent Labs  Lab 12/30/20 1023  TROPONINIHS 88*      Chemistry Recent Labs  Lab 12/31/20 0330 12/31/20 0753 01/02/21 0510 01/02/21 1545 01/03/21 0638  NA 131*   < > 129*  130* 130* 132*  K 3.6   < > 4.5  4.6 4.5 4.3  CL 97*   < > 100  100 100 98  CO2 26   < > 22  23 23 25   GLUCOSE 268*   <  > 194*  194* 168* 69*  BUN 27*   < > 23*  23* 22* 20  CREATININE 1.92*   < > 1.43*  1.44* 1.36* 1.22*  CALCIUM 7.3*   < > 7.6*  7.5* 7.2* 7.5*  PROT 8.0  --  7.8  --  8.0  ALBUMIN 1.1*   < > 1.1*  1.1* 1.0* 1.1*  AST 41  --  42*  --  38  ALT 19  --  21  --  21  ALKPHOS 372*  --  408*  --  389*  BILITOT 2.7*  --  2.0*  --  1.6*  GFRNONAA 32*   < > 46*  45* 48* 55*  ANIONGAP 8   < > 7  7 7 9    < > = values in this interval not displayed.     Hematology Recent Labs  Lab 12/31/20 0330 12/31/20 0753 01/02/21 0510 01/03/21 0638  WBC 35.1*  --  23.5* 15.4*  RBC 3.07*  --  2.34* 2.77*  HGB 9.8* 9.9* 7.3* 8.7*  HCT 27.7* 29.0* 22.2* 26.3*  MCV 90.2  --  94.9 94.9  MCH 31.9  --  31.2 31.4  MCHC 35.4  --  32.9 33.1  RDW 14.1  --  14.8 14.7  PLT 144*  --  PLATELET CLUMPS NOTED ON SMEAR, UNABLE TO ESTIMATE PLATELET CLUMPS NOTED ON SMEAR, COUNT APPEARS DECREASED    BNP Recent Labs  Lab 12/28/20 1641  BNP 1,323.6*     DDimer No results for input(s): DDIMER in the last 168 hours.   Radiology    DG Chest Port 1 View  Result Date: 01/03/2021 CLINICAL DATA:  Respiratory failure. EXAM: PORTABLE CHEST 1 VIEW COMPARISON:  CT 12/31/2020.  12/30/2020. FINDINGS: Endotracheal tube, NG tube, right IJ line stable position. Stable cardiomegaly. Mild bibasilar atelectasis again noted. No pleural effusion or pneumothorax. IMPRESSION: 1.  Lines and tubes in stable position. 2. Stable cardiomegaly. Mild bibasilar atelectasis. Chest is stable from prior exam. Electronically Signed   By: Marcello Moores  Register   On: 01/03/2021 07:46   ECHOCARDIOGRAM COMPLETE  Result Date: 01/02/2021    ECHOCARDIOGRAM REPORT   Patient Name:   [name] [name] Date of Exam: 01/02/2021 Medical Rec #:  [id number]       Height:       62.0 in Accession #:    [id number]      Weight:       218.3 lb Date  of Birth:  Nov 01, 1970       BSA:          1.984 m Patient Age:    72 years        BP:           102/72 mmHg Patient Gender: F                HR:           100 bpm. Exam Location:  Inpatient Procedure: 2D Echo, Color Doppler, Cardiac Doppler and Intracardiac            Opacification Agent Indications:    Dyspnea  History:        Patient has no prior history of Echocardiogram examinations.                 Risk Factors:Current Smoker, Dyslipidemia and Diabetes.  Sonographer:    Bernadene Person RDCS Referring Phys: [place]  1. Left ventricular ejection fraction, by estimation, is 45 to 50%. The left ventricle has mildly decreased function. The left ventricle demonstrates global hypokinesis. Left ventricular diastolic parameters were normal.  2. Right ventricular systolic function is normal. The right ventricular size is normal. There is normal pulmonary artery systolic pressure.  3. The mitral valve is normal in structure. Mild mitral valve regurgitation. No evidence of mitral stenosis.  4. The aortic valve is normal in structure. Aortic valve regurgitation is not visualized. No aortic stenosis is present.  5. The inferior vena cava is normal in size with greater than 50% respiratory variability, suggesting right atrial pressure of 3 mmHg. FINDINGS  Left Ventricle: Left ventricular ejection fraction, by estimation, is 45 to 50%. The left ventricle has mildly decreased function. The left ventricle demonstrates global hypokinesis. Definity contrast agent was given IV to delineate the left ventricular  endocardial borders. The left ventricular internal cavity size was normal in size. There is no left ventricular hypertrophy. Left ventricular diastolic parameters were normal. Right Ventricle: The right ventricular size is normal. No increase in right ventricular wall thickness. Right ventricular systolic function is normal. There is normal pulmonary artery systolic pressure. The tricuspid regurgitant velocity is 1.61 m/s, and  with an assumed right atrial pressure of 3 mmHg, the estimated right ventricular systolic pressure is  92.9 mmHg. Left Atrium: Left atrial size was normal in size. Right Atrium: Right atrial size was normal in size. Pericardium: There is no evidence of pericardial effusion. Mitral Valve: The mitral valve is normal in structure. Mild mitral valve regurgitation. No evidence of mitral valve stenosis. Tricuspid Valve: The tricuspid valve is normal in structure. Tricuspid valve regurgitation is mild . No evidence of tricuspid stenosis. Aortic Valve: The aortic valve is normal in structure. Aortic valve regurgitation is not visualized. No aortic stenosis is present. Pulmonic Valve: The pulmonic valve was normal in structure. Pulmonic valve regurgitation is not visualized. No evidence of pulmonic stenosis. Aorta: The aortic root is normal in size and structure. Venous: The inferior vena cava is normal in size with greater than 50% respiratory variability, suggesting right atrial pressure of 3 mmHg. IAS/Shunts: No atrial level shunt detected by color flow Doppler.  LEFT VENTRICLE PLAX 2D LVIDd:         5.50 cm LVIDs:         4.40 cm LV PW:         1.00 cm LV IVS:        0.90 cm LVOT diam:  1.80 cm LV SV:         41 LV SV Index:   20 LVOT Area:     2.54 cm  LEFT ATRIUM             Index       RIGHT ATRIUM           Index LA diam:        3.80 cm 1.92 cm/m  RA Area:     14.00 cm LA Vol (A2C):   53.0 ml 26.71 ml/m RA Volume:   30.90 ml  15.57 ml/m LA Vol (A4C):   47.4 ml 23.89 ml/m LA Biplane Vol: 53.0 ml 26.71 ml/m  AORTIC VALVE LVOT Vmax:   103.17 cm/s LVOT Vmean:  65.633 cm/s LVOT VTI:    0.160 m  AORTA Ao Root diam: 2.80 cm Ao Asc diam:  2.90 cm TRICUSPID VALVE TR Peak grad:   10.4 mmHg TR Vmax:        161.00 cm/s  SHUNTS Systemic VTI:  0.16 m Systemic Diam: 1.80 cm Candee Furbish MD Electronically signed by Candee Furbish MD Signature Date/Time: 01/02/2021/4:06:25 PM    Final     Cardiac Studies   Echo 01/02/21: 1. Left ventricular ejection fraction, by estimation, is 45 to 50%. The  left ventricle has mildly  decreased function. The left ventricle  demonstrates global hypokinesis. Left ventricular diastolic parameters  were normal.   2. Right ventricular systolic function is normal. The right ventricular  size is normal. There is normal pulmonary artery systolic pressure.   3. The mitral valve is normal in structure. Mild mitral valve  regurgitation. No evidence of mitral stenosis.   4. The aortic valve is normal in structure. Aortic valve regurgitation is  not visualized. No aortic stenosis is present.   5. The inferior vena cava is normal in size with greater than 50%  respiratory variability, suggesting right atrial pressure of 3 mmHg.   Patient Profile     Ms. Watkin is a 65F with diabetes, hypertension, hyperlipidemia, GERD, and morbid obesity admitted with septic shock secondary to cellulitis and left buttock abscess.  Her hospitalization has been complicated by acute oliguric renal failure and encephalopathy.    Cardiology was consulted for atrial flutter with RVR and prolonged QTc.  Assessment & Plan    # Atrial flutter:  New this admission in the setting of critical illness and sepsis. She has been on amiodarone and HR improving on metoprolol.  Plan for TEE/DCCV once she can be on anticoagulation.  Would start heparin as soon as she is OK per surgery.    # Acute systolic and diastolic heart failure:  LVEF 45-50% this admission.  Likely 2/2 acute illness and tachycardia but given her risk factors she will need an ischemic evaluation once medically stable.  BP too low for GDMT.  Continue metoprolol for now.   For questions or updates, please contact [place] Please consult [email or web address] for contact info under        Signed, Skeet Latch, MD  01/03/2021, 8:48 AM

## 2021-01-03 NOTE — Transfer of Care (Signed)
Immediate Anesthesia Transfer of Care Note  Patient: [name] [name]  Procedure(s) Performed: IRRIGATION AND DEBRIDEMENT LEFT BUTTOCK (Left: Buttocks)  Patient Location: PACU  Anesthesia Type:General  Level of Consciousness: sedated  Airway & Oxygen Therapy: Patient remains intubated per anesthesia plan and Patient placed on Ventilator (see vital sign flow sheet for setting)  Post-op Assessment: Report given to RN and Post -op Vital signs reviewed and stable  Post vital signs: Reviewed and stable  Last Vitals:  Vitals Value Taken Time  BP 149/87 01/03/21 1040  Temp    Pulse 112 01/03/21 1045  Resp    SpO2 100 % 01/03/21 1045  Vitals shown include unvalidated device data.  Last Pain:  Vitals:   01/03/21 0702  TempSrc: Axillary  PainSc:          Complications: No notable events documented.

## 2021-01-03 NOTE — Op Note (Signed)
01/03/2021  10:10 AM  PATIENT:  [name] [name]  50 y.o. female  PRE-OPERATIVE DIAGNOSIS:  necrotizing soft tissue infection  POST-OPERATIVE DIAGNOSIS:  necrotizing soft tissue infection  PROCEDURE:  Procedure(s): IRRIGATION AND DEBRIDEMENT LEFT BUTTOCK (Left)  SURGEON:  Surgeon(s) and Role:    Axel Filler, MD - Primary  ASSISTANTS: Ruel Favors, RNFA   ANESTHESIA:   general  EBL:  100 mL   BLOOD ADMINISTERED:none  DRAINS: none   LOCAL MEDICATIONS USED:  NONE  SPECIMEN:  No Specimen  DISPOSITION OF SPECIMEN:  N/A  COUNTS:  YES  TOURNIQUET:  * No tourniquets in log *  DICTATION: .Dragon Dictation Findings: Patient had acute area of viable tissue on her left [place].  There was an area that was tracking to the left labia anteriorly.  This was debrided as far as I could debride.  There was some necrotic liquefied fat necrosis.  The wound was dressed with Dakin soaked Kerlix.  Details procedure: The patient was consented she was taken back to the OR and placed in the prone position and under general anesthesia.  Patient was prepped and draped in standard fashion.  A timeout was called and all facts verified.  At this time the area of the left knee was visualized.  There was a small amount of superficial necrotic fat that was debrided.  The area of further necrosis appeared to be inferior and anterior to the left labia area.  This was debrided in its entirety.  This is more necrotic liquefied fat necrosis.  The area appeared to be oozing from the left gluteal area.  A rim of tissue was also debrided from the left superior gluteal area.  This did appear to be necrotic fat as well.  At this time Dakin soaked Kerlix was placed into the left labial area as well as the left gluteal area.  The area was dressed with ABD pad and tape.  Patient taught the procedure well.  Patient was taken back to the ICU intubated in stable condition.   PLAN OF CARE: Admit to inpatient    PATIENT DISPOSITION:  ICU - intubated and critically ill.   Delay start of Pharmacological VTE agent (>24hrs) due to surgical blood loss or risk of bleeding: no

## 2021-01-03 NOTE — Progress Notes (Deleted)
NAME:  [name] [name], MRN:  [id number], DOB:  [birth year], LOS: 6 ADMISSION DATE:  12/28/2020, CONSULTATION DATE:  6/3 REFERRING MD:  [facility] MD, CHIEF COMPLAINT:  Fever, pain and ulceration  History of Present Illness:  50 y/o female initially admitted to [place] [facility] on 5/29 complaining of fever and a buttock abscess, moved to [facility] on 6/3 in the setting of worsening renal failure, acidosis, encephaloapthy with known group B strep wound culture.  Pertinent  Medical History  Recent Vulvar irritation and Bartholin Cyst seen at [facility]  HTN DM type II Cholecystectomy  GERD Anxiety  Significant Hospital Events: Including procedures, antibiotic start and stop dates in addition to other pertinent events   5/29 admitted to [facility] sepsis felt 2/2 cellulitis on buttocks. Cultures sent. CT abd/pelvis + [place] gas but no drainable abscess. Started on Vanc. IVFs. Wound also cultured.  6/1 wound culture back + group B strep. 1 of 4 blood cultures + GPC. ABX changed to rocephin 6/3 Arrived at  [facility] from [place] w/ new hypotension, worsening renal failure, worsening leukocytosis, poorly controlled pain and new encephalopathy. Admitted to ICU. Started on zyvox and meropenem. Surgical consult requested> to OR for debridement 6/3 started CRRT 6/3 meropenem > 6/5 6/3 linezolid >  6/4 IVIg >6/6 6/6 zosyn >  6/6 back to OR for debridement, in and out of atrial flutter; QTc 590; cardiology consult> amiodarone 6/7 off vasopressors, continues to have atrial flutter, started ketamine 6/8 started on metoprolol, echo with LVEF 45-50%.   Interim History / Subjective:  Overnight - Patient was agitated and was given Versed Off vasopressors Patient is going to the OR for debridement.  Will attempt to Wean patient when she returns form OR.    Objective   Blood pressure 136/71, pulse (!) 112, temperature (!) 96 F (35.6 C), temperature source Axillary, resp. rate (!) 30, height 5\' 2"  (1.575 m), weight  98.4 kg, SpO2 100 %.    Vent Mode: PRVC FiO2 (%):  [30 %] 30 % Set Rate:  [30 bmp] 30 bmp Vt Set:  [400 mL] 400 mL PEEP:  [5 cmH20] 5 cmH20 Pressure Support:  [8 cmH20] 8 cmH20 Plateau Pressure:  [15 cmH20-16 cmH20] 15 cmH20   Intake/Output Summary (Last 24 hours) at 01/03/2021 1113 Last data filed at 01/03/2021 1046 Gross per 24 hour  Intake 3339.16 ml  Output 3596 ml  Net -256.84 ml    Filed Weights   01/01/21 0500 01/02/21 0500 01/03/21 0408  Weight: 100.1 kg 99 kg 98.4 kg    Examination:  General:  In bed on vent HENT: NCAT ETT in place PULM: CTA B, vent supported breathing CV: RRR, no mgr GI: BS+, soft, nontender MSK: normal bulk and tone Neuro: sedated on vent.   Labs/imaging that I havepersonally reviewed  (right click and "Reselect all SmartList Selections" daily)  WBC 15.4-->23.5 K yesterday Na 132 --> 129 yesterday Cr - 1.22 --> 1.36 yesterday WBC -15.4 --> 23.5 yesterday Hgb - 8.7 --> 7.3 yesterday   Resolved Hospital Problem list     Assessment & Plan:   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, s/p wound exploration and debridement on 6/3 Back to OR today Continue zosyn Monitor; off levophed  Oliguric AKI Continue CRRT Monitor BMET and UOP Replace electrolytes as needed Remove volume slowly  Atrial flutter Acute systolic and diastolic heart failure Tele Continue metoprolol Continue amiodarone Appreciate cardiology 6/8 LVEF 45-50%.  Acute respiratory failure with hypoxemia Full mechanical vent support VAP prevention Daily  WUA/SBT Wean after back from OR Added Seroquel for agitation at night   Mild normocytic anemia without bleeding Monitor for bleeding  6/9 Hemoglobin of 8.7 Transfuse PRBC for Hgb < 7 gm/dL  DM2 with hyperglycemia SSI Glucose is 137 ; target between 140 - < 200 to keeping blood glucose levels high enough to avoid hypoglycemia. Tube feeding to continue Levmir 10 units and TF 3-4 units   Acute  metabolic encephalopathy Baseline narcotic dependence, high pain medication needs this admission Need for sedation for mechanical ventilation RASS goal -2 Continue ketamine and fentanyl  PAD protocol  Best practice (right click and "Reselect all SmartList Selections" daily)  Diet:  Tube Feed  Pain/Anxiety/Delirium protocol (if indicated): Yes (RASS goal -2) VAP protocol (if indicated): Yes DVT prophylaxis: Subcutaneous Heparin GI prophylaxis: H2B Glucose control:  SSI Yes Central venous access:  Yes, and it is still needed Arterial line:  Yes, and it is still needed Foley:  Yes, and it is still needed Mobility:  bed rest  PT consulted: Yes Last date of multidisciplinary goals of care discussion [called her sister for an update on 6/5, have not had goals of care conversation at this point] Code Status:  full code Disposition: remain in ICU  Labs   CBC: Recent Labs  Lab 12/29/20 0253 12/30/20 0420 12/31/20 0330 12/31/20 0753 01/02/21 0510 01/03/21 0638  WBC 60.9* 44.0* 35.1*  --  23.5* 15.4*  NEUTROABS  --  38.4* 29.9*  --  19.1* 11.5*  HGB 11.1* 9.8* 9.8* 9.9* 7.3* 8.7*  HCT 31.1* 27.2* 27.7* 29.0* 22.2* 26.3*  MCV 90.4 87.5 90.2  --  94.9 94.9  PLT 156 149* 144*  --  PLATELET CLUMPS NOTED ON SMEAR, UNABLE TO ESTIMATE PLATELET CLUMPS NOTED ON SMEAR, COUNT APPEARS DECREASED     Basic Metabolic Panel: Recent Labs  Lab 12/30/20 1601 12/31/20 0330 12/31/20 0753 01/01/21 0348 01/01/21 1530 01/02/21 0510 01/02/21 1545 01/03/21 0638  NA 130* 131*   < > 128* 128* 129*  130* 130* 132*  K 3.8 3.6   < > 3.9 4.2 4.5  4.6 4.5 4.3  CL 94* 97*   < > 98 99 100  100 100 98  CO2 27 26   < > 22 24 22  23 23 25   GLUCOSE 236* 268*   < > 212* 163* 194*  194* 168* 69*  BUN 26* 27*   < > 26* 23* 23*  23* 22* 20  CREATININE 2.16* 1.92*   < > 1.62* 1.46* 1.43*  1.44* 1.36* 1.22*  CALCIUM 7.4* 7.3*   < > 6.9* 7.2* 7.6*  7.5* 7.2* 7.5*  MG 2.3 2.3  --  2.1  --  2.2  --  2.1   PHOS 1.9*  1.9*  --    < > 3.6 3.3 3.2 3.3 2.8   < > = values in this interval not displayed.    GFR: Estimated Creatinine Clearance: 62.5 mL/min (A) (by C-G formula based on SCr of 1.22 mg/dL (H)). Recent Labs  Lab 12/28/20 1641 12/28/20 1824 12/28/20 2326 12/29/20 0253 12/30/20 0420 12/31/20 0330 01/02/21 0510 01/03/21 03/05/21  PROCALCITON 17.91  --   --   --   --   --   --   --   WBC 52.3*  --  77.5*   < > 44.0* 35.1* 23.5* 15.4*  LATICACIDVEN 3.3* 3.4* 5.1*  --   --   --   --   --    < > =  values in this interval not displayed.     Liver Function Tests: Recent Labs  Lab 12/29/20 0253 12/29/20 1824 12/30/20 0420 12/30/20 1601 12/31/20 0330 12/31/20 1600 01/01/21 0348 01/01/21 1530 01/02/21 0510 01/02/21 1545 01/03/21 0638  AST 35  --  29  --  41  --   --   --  42*  --  38  ALT 19  --  14  --  19  --   --   --  21  --  21  ALKPHOS 353*  --  299*  --  372*  --   --   --  408*  --  389*  BILITOT 6.0*  --  3.8*  --  2.7*  --   --   --  2.0*  --  1.6*  PROT 5.4*  --  7.3  --  8.0  --   --   --  7.8  --  8.0  ALBUMIN 1.4*   < > 1.1*   < > 1.1*   < > <1.0* 1.0* 1.1*  1.1* 1.0* 1.1*   < > = values in this interval not displayed.    No results for input(s): LIPASE, AMYLASE in the last 168 hours. No results for input(s): AMMONIA in the last 168 hours.  ABG    Component Value Date/Time   PHART 7.497 (H) 12/31/2020 0753   PCO2ART 36.3 12/31/2020 0753   PO2ART 144 (H) 12/31/2020 0753   HCO3 28.2 (H) 12/31/2020 0753   TCO2 29 12/31/2020 0753   ACIDBASEDEF 6.0 (H) 12/29/2020 0217   O2SAT 99.0 12/31/2020 0753     Coagulation Profile: Recent Labs  Lab 12/28/20 1641 12/28/20 2326  INR 1.3* 1.5*     Cardiac Enzymes: No results for input(s): CKTOTAL, CKMB, CKMBINDEX, TROPONINI in the last 168 hours.  HbA1C: Hgb A1c MFr Bld  Date/Time Value Ref Range Status  12/28/2020 06:48 PM 11.1 (H) 4.8 - 5.6 % Final    Comment:    (NOTE)         Prediabetes: 5.7 -  6.4         Diabetes: >6.4         Glycemic control for adults with diabetes: <7.0     CBG: Recent Labs  Lab 01/02/21 1904 01/02/21 2304 01/03/21 0309 01/03/21 0710 01/03/21 0745  GLUCAP 156* 145* 99 50* 137*    Dennie Bible MS4

## 2021-01-04 ENCOUNTER — Encounter (HOSPITAL_COMMUNITY): Payer: Self-pay | Admitting: General Surgery

## 2021-01-04 ENCOUNTER — Inpatient Hospital Stay (HOSPITAL_COMMUNITY): Payer: Medicaid Other

## 2021-01-04 DIAGNOSIS — R509 Fever, unspecified: Secondary | ICD-10-CM | POA: Diagnosis not present

## 2021-01-04 DIAGNOSIS — E669 Obesity, unspecified: Secondary | ICD-10-CM | POA: Diagnosis not present

## 2021-01-04 LAB — CBC
HCT: 24.5 % — ABNORMAL LOW (ref 36.0–46.0)
Hemoglobin: 8.3 g/dL — ABNORMAL LOW (ref 12.0–15.0)
MCH: 31.8 pg (ref 26.0–34.0)
MCHC: 33.9 g/dL (ref 30.0–36.0)
MCV: 93.9 fL (ref 80.0–100.0)
Platelets: 165 10*3/uL (ref 150–400)
RBC: 2.61 MIL/uL — ABNORMAL LOW (ref 3.87–5.11)
RDW: 14.7 % (ref 11.5–15.5)
WBC: 15.1 10*3/uL — ABNORMAL HIGH (ref 4.0–10.5)
nRBC: 0 % (ref 0.0–0.2)

## 2021-01-04 LAB — COMPREHENSIVE METABOLIC PANEL
ALT: 20 U/L (ref 0–44)
AST: 34 U/L (ref 15–41)
Albumin: 1.3 g/dL — ABNORMAL LOW (ref 3.5–5.0)
Alkaline Phosphatase: 374 U/L — ABNORMAL HIGH (ref 38–126)
Anion gap: 8 (ref 5–15)
BUN: 33 mg/dL — ABNORMAL HIGH (ref 6–20)
CO2: 21 mmol/L — ABNORMAL LOW (ref 22–32)
Calcium: 7.5 mg/dL — ABNORMAL LOW (ref 8.9–10.3)
Chloride: 101 mmol/L (ref 98–111)
Creatinine, Ser: 2.01 mg/dL — ABNORMAL HIGH (ref 0.44–1.00)
GFR, Estimated: 30 mL/min — ABNORMAL LOW (ref >60)
Glucose, Bld: 208 mg/dL — ABNORMAL HIGH (ref 70–99)
Potassium: 5.6 mmol/L — ABNORMAL HIGH (ref 3.5–5.1)
Sodium: 130 mmol/L — ABNORMAL LOW (ref 135–145)
Total Bilirubin: 1.4 mg/dL — ABNORMAL HIGH (ref 0.3–1.2)
Total Protein: 7.6 g/dL (ref 6.5–8.1)

## 2021-01-04 LAB — GLUCOSE, CAPILLARY
Glucose-Capillary: 208 mg/dL — ABNORMAL HIGH (ref 70–99)
Glucose-Capillary: 198 mg/dL — ABNORMAL HIGH (ref 70–99)
Glucose-Capillary: 191 mg/dL — ABNORMAL HIGH (ref 70–99)
Glucose-Capillary: 183 mg/dL — ABNORMAL HIGH (ref 70–99)
Glucose-Capillary: 163 mg/dL — ABNORMAL HIGH (ref 70–99)

## 2021-01-04 LAB — RENAL FUNCTION PANEL
Albumin: 1.3 g/dL — ABNORMAL LOW (ref 3.5–5.0)
Anion gap: 11 (ref 5–15)
BUN: 34 mg/dL — ABNORMAL HIGH (ref 6–20)
CO2: 22 mmol/L (ref 22–32)
Calcium: 7.7 mg/dL — ABNORMAL LOW (ref 8.9–10.3)
Chloride: 100 mmol/L (ref 98–111)
Creatinine, Ser: 2.12 mg/dL — ABNORMAL HIGH (ref 0.44–1.00)
GFR, Estimated: 28 mL/min — ABNORMAL LOW (ref >60)
Glucose, Bld: 176 mg/dL — ABNORMAL HIGH (ref 70–99)
Phosphorus: 5.1 mg/dL — ABNORMAL HIGH (ref 2.5–4.6)
Potassium: 4.5 mmol/L (ref 3.5–5.1)
Sodium: 133 mmol/L — ABNORMAL LOW (ref 135–145)

## 2021-01-04 LAB — URINALYSIS, ROUTINE W REFLEX MICROSCOPIC
Bilirubin Urine: NEGATIVE
Glucose, UA: NEGATIVE mg/dL
Ketones, ur: NEGATIVE mg/dL
Nitrite: NEGATIVE
Protein, ur: 30 mg/dL — AB
Specific Gravity, Urine: 1.012 (ref 1.005–1.030)
WBC, UA: >50 WBC/hpf — ABNORMAL HIGH (ref 0–5)
pH: 5 (ref 5.0–8.0)

## 2021-01-04 LAB — PHOSPHORUS: Phosphorus: 4.9 mg/dL — ABNORMAL HIGH (ref 2.5–4.6)

## 2021-01-04 LAB — MAGNESIUM: Magnesium: 2.1 mg/dL (ref 1.7–2.4)

## 2021-01-04 MED ORDER — PIPERACILLIN-TAZOBACTAM 3.375 G IVPB 30 MIN
3.3750 g | Freq: Four times a day (QID) | INTRAVENOUS | Status: DC
  Administered 2021-01-04 – 2021-01-08 (×15): 3.375 g via INTRAVENOUS
  Filled 2021-01-04 (×16): qty 50

## 2021-01-04 MED ORDER — INSULIN DETEMIR 100 UNIT/ML ~~LOC~~ SOLN
14.0000 [IU] | Freq: Two times a day (BID) | SUBCUTANEOUS | Status: DC
  Administered 2021-01-04 – 2021-01-06 (×6): 14 [IU] via SUBCUTANEOUS
  Filled 2021-01-04 (×8): qty 0.14

## 2021-01-04 MED ORDER — HEPARIN (PORCINE) 2000 UNITS/L FOR CRRT
INTRAVENOUS_CENTRAL | Status: DC | PRN
  Filled 2021-01-04 (×2): qty 1000

## 2021-01-04 MED ORDER — INSULIN DETEMIR 100 UNIT/ML ~~LOC~~ SOLN: 18.0000 [IU] | Freq: Two times a day (BID) | SUBCUTANEOUS | Status: DC

## 2021-01-04 MED ORDER — PRISMASOL BGK 0/2.5 32-2.5 MEQ/L EC SOLN
Status: DC
  Filled 2021-01-04 (×11): qty 5000

## 2021-01-04 MED ORDER — AMIODARONE HCL 200 MG PO TABS: 200.0000 mg | ORAL_TABLET | Freq: Every day | ORAL | Status: DC

## 2021-01-04 MED ORDER — HEPARIN SODIUM (PORCINE) 1000 UNIT/ML DIALYSIS
1000.0000 [IU] | INTRAMUSCULAR | Status: DC | PRN
  Filled 2021-01-04: qty 6

## 2021-01-04 MED ORDER — PRISMASOL BGK 4/2.5 32-4-2.5 MEQ/L EC SOLN
Status: DC
  Filled 2021-01-04 (×34): qty 5000

## 2021-01-04 MED ORDER — SODIUM ZIRCONIUM CYCLOSILICATE 10 G PO PACK
10.0000 g | PACK | Freq: Every day | ORAL | Status: DC
  Administered 2021-01-04: 10 g
  Filled 2021-01-04: qty 1

## 2021-01-04 MED ORDER — PRISMASOL BGK 0/2.5 32-2.5 MEQ/L EC SOLN
Status: DC
  Filled 2021-01-04 (×6): qty 5000

## 2021-01-04 MED ORDER — AMIODARONE HCL 200 MG PO TABS
400.0000 mg | ORAL_TABLET | Freq: Two times a day (BID) | ORAL | Status: DC
  Administered 2021-01-04 – 2021-01-06 (×5): 400 mg
  Filled 2021-01-04 (×5): qty 2

## 2021-01-04 MED ORDER — VITAL 1.5 CAL PO LIQD
1000.0000 mL | ORAL | Status: DC
  Administered 2021-01-04 – 2021-01-06 (×4): 1000 mL
  Filled 2021-01-04 (×3): qty 1000

## 2021-01-04 NOTE — Progress Notes (Signed)
NAME:  [name] [name], MRN:  [id number], DOB:  18-Aug-1970, LOS: 7 ADMISSION DATE:  12/28/2020, CONSULTATION DATE:  6/3 REFERRING MD:  [facility] MD, CHIEF COMPLAINT:  Fever, pain and ulceration  History of Present Illness:  50 y/o female initially admitted to [place] on 5/29 complaining of fever and a buttock abscess, moved to [facility] on 6/3 in the setting of worsening renal failure, acidosis, encephaloapthy with known group B strep wound culture.  Pertinent  Medical History  Recent Vulvar irritation and Bartholin Cyst seen at [facility]  HTN DM type II Cholecystectomy  GERD Anxiety  Significant Hospital Events: Including procedures, antibiotic start and stop dates in addition to other pertinent events   5/29 admitted to [place] sepsis felt 2/2 cellulitis on buttocks. Cultures sent. CT abd/pelvis + [place] gas but no drainable abscess. Started on Vanc. IVFs. Wound also cultured.  6/1 wound culture back + group B strep. 1 of 4 blood cultures + GPC. ABX changed to rocephin 6/3 Arrived at  [facility] from [place] w/ new hypotension, worsening renal failure, worsening leukocytosis, poorly controlled pain and new encephalopathy. Admitted to ICU. Started on zyvox and meropenem. Surgical consult requested> to OR for debridement 6/3 started CRRT 6/3 meropenem > 6/5 6/3 linezolid >  6/4 IVIg >6/6 6/6 zosyn >  6/6 back to OR for debridement, in and out of atrial flutter; QTc 590; cardiology consult> amiodarone 6/7 off vasopressors, continues to have atrial flutter, started ketamine 6/8 started on metoprolol, echo with LVEF 45-50%.  6/9 OR debridement    Interim History / Subjective:  Overnight, Patient was febrile with T max 101.4 and was given tylenol.  Vasopressors resumed for hypotension and fever. Metoprolol dc'ed.  K is elevated to 5.6.  CRRT restarted  Will attempt to wean patient and SBT of patient. Ketamine and fentanyl stopped    Objective   Blood pressure 125/69, pulse (!) 109,  temperature 100.3 F (37.9 C), temperature source Oral, resp. rate 20, height 5\' 2"  (1.575 m), weight 98.4 kg, SpO2 99 %.    Vent Mode: PSV;CPAP FiO2 (%):  [30 %] 30 % Set Rate:  [30 bmp] 30 bmp Vt Set:  [400 mL] 400 mL PEEP:  [5 cmH20] 5 cmH20 Pressure Support:  [5 cmH20-8 cmH20] 5 cmH20 Plateau Pressure:  [15 cmH20-19 cmH20] 16 cmH20   Intake/Output Summary (Last 24 hours) at 01/04/2021 1144 Last data filed at 01/04/2021 1021 Gross per 24 hour  Intake 2133.79 ml  Output 410 ml  Net 1723.79 ml   Filed Weights   01/01/21 0500 01/02/21 0500 01/03/21 0408  Weight: 100.1 kg 99 kg 98.4 kg    Examination:  General:  In bed on vent HENT: NCAT ETT in place PULM: CTA B, vent supported breathing CV: RRR, no mgr GI: BS+, soft, nontender MSK: normal bulk and tone Neuro: sedated on vent.  Not following verbal commands.  Eyes open; accommodation sluggish.    Labs/imaging that I havepersonally reviewed  (right click and "Reselect all SmartList Selections" daily)  WBC 15.1-->15.4 K yesterday Na 130---> 132 yesterday Cr - 2.01--->1.22 yesterday WBC -15.1 --> 15.4 yesterday Hgb - 8.3-->7 yesterday   Resolved Hospital Problem list     Assessment & Plan:   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, s/p wound exploration and debridement on 6/3 ID Following Continue zosyn Monitor; resume levophed Per surgery she may need to return to OR Monday 6/13 for further debridement Per ID - CXR, Blood culture, UA ordered    Oliguric AKI  Hyperkalemia  Resume CRRT Monitor BMET and UOP K of 5.6; 12 Lead EKG pending Replace electrolytes as needed Remove volume slowly  Atrial flutter Acute systolic and diastolic heart failure Tele Dc'ed metoprolol Changed amiodarone to  400 mg VT bid. Appreciate cardiology 6/8 LVEF 45-50%.  Acute respiratory failure with hypoxemia Full mechanical vent support VAP prevention Daily WUA/SBT Attempt to Wean and SBT Continue Seroquel for  agitation at night   Mild normocytic anemia without bleeding Monitor for bleeding  Transfuse PRBC for Hgb < 7 gm/dL  DM2 with hyperglycemia SSI Glucose is 198 ; target between 140 - 180 to keeping blood glucose levels high enough to avoid hypoglycemia. Tube feeding to continue Levmir increased to 14 units and continue TF 3-4 units   Acute metabolic encephalopathy Baseline narcotic dependence, high pain medication needs this admission Need for sedation for mechanical ventilation RASS goal -2 Dc'ed ketamine and fentanyl  PAD protocol  Best practice (right click and "Reselect all SmartList Selections" daily)  Diet:  Tube Feed  Pain/Anxiety/Delirium protocol (if indicated): Yes (RASS goal -2) VAP protocol (if indicated): Yes DVT prophylaxis: Subcutaneous Heparin GI prophylaxis: H2B Glucose control:  SSI Yes Central venous access:  Yes, and it is still needed Arterial line:  Yes, and it is still needed Foley:  Yes, and it is still needed Mobility:  bed rest  PT consulted: Yes Last date of multidisciplinary goals of care discussion [called her sister for an update on 6/5, have not had goals of care conversation at this point] Code Status:  full code Disposition: remain in ICU  Labs   CBC: Recent Labs  Lab 12/30/20 0420 12/31/20 0330 12/31/20 0753 01/02/21 0510 01/03/21 0638 01/04/21 0149  WBC 44.0* 35.1*  --  23.5* 15.4* 15.1*  NEUTROABS 38.4* 29.9*  --  19.1* 11.5*  --   HGB 9.8* 9.8* 9.9* 7.3* 8.7* 8.3*  HCT 27.2* 27.7* 29.0* 22.2* 26.3* 24.5*  MCV 87.5 90.2  --  94.9 94.9 93.9  PLT 149* 144*  --  PLATELET CLUMPS NOTED ON SMEAR, UNABLE TO ESTIMATE PLATELET CLUMPS NOTED ON SMEAR, COUNT APPEARS DECREASED 165    Basic Metabolic Panel: Recent Labs  Lab 12/31/20 0330 12/31/20 0753 01/01/21 0348 01/01/21 1530 01/02/21 0510 01/02/21 1545 01/03/21 0638 01/04/21 0149  NA 131*   < > 128* 128* 129*  130* 130* 132* 130*  K 3.6   < > 3.9 4.2 4.5  4.6 4.5 4.3 5.6*   CL 97*   < > 98 99 100  100 100 98 101  CO2 26   < > 22 24 22  23 23 25  21*  GLUCOSE 268*   < > 212* 163* 194*  194* 168* 69* 208*  BUN 27*   < > 26* 23* 23*  23* 22* 20 33*  CREATININE 1.92*   < > 1.62* 1.46* 1.43*  1.44* 1.36* 1.22* 2.01*  CALCIUM 7.3*   < > 6.9* 7.2* 7.6*  7.5* 7.2* 7.5* 7.5*  MG 2.3  --  2.1  --  2.2  --  2.1 2.1  PHOS  --    < > 3.6 3.3 3.2 3.3 2.8 4.9*   < > = values in this interval not displayed.   GFR: Estimated Creatinine Clearance: 37.9 mL/min (A) (by C-G formula based on SCr of 2.01 mg/dL (H)). Recent Labs  Lab 12/28/20 1641 12/28/20 1824 12/28/20 2326 12/29/20 0253 12/31/20 0330 01/02/21 0510 01/03/21 03/05/21 01/04/21 0149  PROCALCITON 17.91  --   --   --   --   --   --   --  WBC 52.3*  --  77.5*   < > 35.1* 23.5* 15.4* 15.1*  LATICACIDVEN 3.3* 3.4* 5.1*  --   --   --   --   --    < > = values in this interval not displayed.    Liver Function Tests: Recent Labs  Lab 12/30/20 0420 12/30/20 1601 12/31/20 0330 12/31/20 1600 01/01/21 1530 01/02/21 0510 01/02/21 1545 01/03/21 0638 01/04/21 0149  AST 29  --  41  --   --  42*  --  38 34  ALT 14  --  19  --   --  21  --  21 20  ALKPHOS 299*  --  372*  --   --  408*  --  389* 374*  BILITOT 3.8*  --  2.7*  --   --  2.0*  --  1.6* 1.4*  PROT 7.3  --  8.0  --   --  7.8  --  8.0 7.6  ALBUMIN 1.1*   < > 1.1*   < > 1.0* 1.1*  1.1* 1.0* 1.1* 1.3*   < > = values in this interval not displayed.   No results for input(s): LIPASE, AMYLASE in the last 168 hours. No results for input(s): AMMONIA in the last 168 hours.  ABG    Component Value Date/Time   PHART 7.497 (H) 12/31/2020 0753   PCO2ART 36.3 12/31/2020 0753   PO2ART 144 (H) 12/31/2020 0753   HCO3 28.2 (H) 12/31/2020 0753   TCO2 29 12/31/2020 0753   ACIDBASEDEF 6.0 (H) 12/29/2020 0217   O2SAT 99.0 12/31/2020 0753     Coagulation Profile: Recent Labs  Lab 12/28/20 1641 12/28/20 2326  INR 1.3* 1.5*    Cardiac Enzymes: No  results for input(s): CKTOTAL, CKMB, CKMBINDEX, TROPONINI in the last 168 hours.  HbA1C: Hgb A1c MFr Bld  Date/Time Value Ref Range Status  12/28/2020 06:48 PM 11.1 (H) 4.8 - 5.6 % Final    Comment:    (NOTE)         Prediabetes: 5.7 - 6.4         Diabetes: >6.4         Glycemic control for adults with diabetes: <7.0     CBG: Recent Labs  Lab 01/03/21 1514 01/03/21 1905 01/03/21 2301 01/04/21 0301 01/04/21 0802  GLUCAP 136* 131* 140* 208* 198*   Luisa Hart Denisha Hoel MS4

## 2021-01-04 NOTE — Progress Notes (Addendum)
Progress Note  Patient Name: [name] [name] Date of Encounter: 01/04/2021  [facility] Cardiologist: Skeet Latch, MD (new)  Subjective   Unable to assess.  Intubated and sedated.  Inpatient Medications    Scheduled Meds:  B-complex with vitamin C  1 tablet Per Tube Daily   chlorhexidine gluconate (MEDLINE KIT)  15 mL Mouth Rinse BID   Chlorhexidine Gluconate Cloth  6 each Topical Q0600   clonazepam  1 mg Per Tube TID   docusate  100 mg Per Tube BID   feeding supplement (PROSource TF)  45 mL Per Tube QID   heparin  5,000 Units Subcutaneous Q8H   insulin aspart  0-20 Units Subcutaneous Q4H   insulin aspart  3 Units Subcutaneous Q4H   insulin detemir  14 Units Subcutaneous BID   mouth rinse  15 mL Mouth Rinse 10 times per day   oxyCODONE  10 mg Per Tube Q4H   pantoprazole sodium  40 mg Per Tube Daily   polyethylene glycol  17 g Per Tube Daily   QUEtiapine  25 mg Per Tube QHS   sodium chloride flush  10-40 mL Intracatheter Q12H   sodium hypochlorite   Irrigation Once   sodium hypochlorite   Irrigation BID   sodium zirconium cyclosilicate  10 g Per Tube Daily   Continuous Infusions:  sodium chloride Stopped (01/01/21 2325)   amiodarone 30 mg/hr (01/04/21 0907)   feeding supplement (VITAL 1.5 CAL) 1,000 mL (01/03/21 1930)   fentaNYL infusion INTRAVENOUS 300 mcg/hr (01/04/21 0800)   norepinephrine (LEVOPHED) Adult infusion 10 mcg/min (01/04/21 0008)   piperacillin-tazobactam (ZOSYN)  IV 12.5 mL/hr at 01/04/21 0800   PRN Meds: acetaminophen, fentaNYL, levalbuterol, midazolam, ondansetron (ZOFRAN) IV, polyethylene glycol, sodium chloride flush   Vital Signs    Vitals:   01/04/21 0717 01/04/21 0730 01/04/21 0800 01/04/21 0803  BP: 97/64 112/67 108/65   Pulse:  (!) 111 (!) 111   Resp:  (!) 23 (!) 25   Temp:    100.3 F (37.9 C)  TempSrc:    Oral  SpO2: 93% 96% 95%   Weight:      Height:        Intake/Output Summary (Last 24 hours) at 01/04/2021 0946 Last  data filed at 01/04/2021 0819 Gross per 24 hour  Intake 2308.79 ml  Output 510 ml  Net 1798.79 ml   Last 3 Weights 01/03/2021 01/02/2021 01/01/2021  Weight (lbs) 216 lb 14.9 oz 218 lb 4.1 oz 220 lb 10.9 oz  Weight (kg) 98.4 kg 99 kg 100.1 kg      Telemetry    Atrial flutter.  Rates 90-110s.- Personally Reviewed  ECG    01/01/2021: Atrial flutter.  Rate 122 bpm.  2-1 AV conduction. - Personally Reviewed  Physical Exam   VS:  BP 108/65 (BP Location: Left Leg)   Pulse (!) 111   Temp 100.3 F (37.9 C) (Oral)   Resp (!) 25   Ht _0  (1.575 m)   Wt 98.4 kg   SpO2 95%   BMI 39.68 kg/m  , BMI Body mass index is 39.68 kg/m. GENERAL: Critically ill-appearing.  Intubated and sedated. HEENT: Pupils equal round and reactive, fundi not visualized, oral mucosa unremarkable NECK:  No jugular venous distention, waveform within normal limits, carotid upstroke brisk and symmetric, no bruits LUNGS:  Clear to auscultation bilaterally HEART: Tachycardic.  Regular rhythm. PMI not displaced or sustained,S1 and S2 within normal limits, no S3, no S4, no clicks, no rubs, no  murmurs ABD:  Flat, positive bowel sounds normal in frequency in pitch, no bruits, no rebound, no guarding, no midline pulsatile mass, no hepatomegaly, no splenomegaly EXT:  2 plus pulses throughout, no edema, no cyanosis no clubbing SKIN:  No rashes no nodules NEURO: Opens eyes to touch and voice but does not localize.  Moves all extremities. PSYCH:  Cognitively intact, oriented to person place and time   Labs    High Sensitivity Troponin:   Recent Labs  Lab 12/30/20 1023  TROPONINIHS 88*      Chemistry Recent Labs  Lab 01/02/21 0510 01/02/21 1545 01/03/21 0638 01/04/21 0149  NA 129*  130* 130* 132* 130*  K 4.5  4.6 4.5 4.3 5.6*  CL 100  100 100 98 101  CO2 _0 21*  GLUCOSE 194*  194* 168* 69* 208*  BUN 23*  23* 22* 20 33*  CREATININE 1.43*  1.44* 1.36* 1.22* 2.01*  CALCIUM 7.6*  7.5* 7.2* 7.5*  7.5*  PROT 7.8  --  8.0 7.6  ALBUMIN 1.1*  1.1* 1.0* 1.1* 1.3*  AST 42*  --  38 34  ALT 21  --  21 20  ALKPHOS 408*  --  389* 374*  BILITOT 2.0*  --  1.6* 1.4*  GFRNONAA 46*  45* 48* 55* 30*  ANIONGAP _1 Hematology Recent Labs  Lab 01/02/21 0510 01/03/21 0638 01/04/21 0149  WBC 23.5* 15.4* 15.1*  RBC 2.34* 2.77* 2.61*  HGB 7.3* 8.7* 8.3*  HCT 22.2* 26.3* 24.5*  MCV 94.9 94.9 93.9  MCH 31.2 31.4 31.8  MCHC 32.9 33.1 33.9  RDW 14.8 14.7 14.7  PLT PLATELET CLUMPS NOTED ON SMEAR, UNABLE TO ESTIMATE PLATELET CLUMPS NOTED ON SMEAR, COUNT APPEARS DECREASED 165    BNP Recent Labs  Lab 12/28/20 1641  BNP 1,323.6*     DDimer No results for input(s): DDIMER in the last 168 hours.   Radiology    DG Chest Port 1 View  Result Date: 01/03/2021 CLINICAL DATA:  Respiratory failure. EXAM: PORTABLE CHEST 1 VIEW COMPARISON:  CT 12/31/2020.  12/30/2020. FINDINGS: Endotracheal tube, NG tube, right IJ line stable position. Stable cardiomegaly. Mild bibasilar atelectasis again noted. No pleural effusion or pneumothorax. IMPRESSION: 1.  Lines and tubes in stable position. 2. Stable cardiomegaly. Mild bibasilar atelectasis. Chest is stable from prior exam. Electronically Signed   By: Marcello Moores  Register   On: 01/03/2021 07:46   ECHOCARDIOGRAM COMPLETE  Result Date: 01/02/2021    ECHOCARDIOGRAM REPORT   Patient Name:   [name] [name] Date of Exam: 01/02/2021 Medical Rec #:  [id number]       Height:       62.0 in Accession #:    [id number]      Weight:       218.3 lb Date of Birth:  1971/03/15       BSA:          1.984 m Patient Age:    50 years        BP:           102/72 mmHg Patient Gender: F               HR:           100 bpm. Exam Location:  Inpatient Procedure: 2D Echo, Color Doppler, Cardiac Doppler and Intracardiac            Opacification Agent Indications:  Dyspnea  History:        Patient has no prior history of Echocardiogram examinations.                 Risk Factors:Current  Smoker, Dyslipidemia and Diabetes.  Sonographer:    Bernadene Person RDCS Referring Phys: [place]  1. Left ventricular ejection fraction, by estimation, is 45 to 50%. The left ventricle has mildly decreased function. The left ventricle demonstrates global hypokinesis. Left ventricular diastolic parameters were normal.  2. Right ventricular systolic function is normal. The right ventricular size is normal. There is normal pulmonary artery systolic pressure.  3. The mitral valve is normal in structure. Mild mitral valve regurgitation. No evidence of mitral stenosis.  4. The aortic valve is normal in structure. Aortic valve regurgitation is not visualized. No aortic stenosis is present.  5. The inferior vena cava is normal in size with greater than 50% respiratory variability, suggesting right atrial pressure of 3 mmHg. FINDINGS  Left Ventricle: Left ventricular ejection fraction, by estimation, is 45 to 50%. The left ventricle has mildly decreased function. The left ventricle demonstrates global hypokinesis. Definity contrast agent was given IV to delineate the left ventricular  endocardial borders. The left ventricular internal cavity size was normal in size. There is no left ventricular hypertrophy. Left ventricular diastolic parameters were normal. Right Ventricle: The right ventricular size is normal. No increase in right ventricular wall thickness. Right ventricular systolic function is normal. There is normal pulmonary artery systolic pressure. The tricuspid regurgitant velocity is 1.61 m/s, and  with an assumed right atrial pressure of 3 mmHg, the estimated right ventricular systolic pressure is 14.2 mmHg. Left Atrium: Left atrial size was normal in size. Right Atrium: Right atrial size was normal in size. Pericardium: There is no evidence of pericardial effusion. Mitral Valve: The mitral valve is normal in structure. Mild mitral valve regurgitation. No evidence of mitral valve  stenosis. Tricuspid Valve: The tricuspid valve is normal in structure. Tricuspid valve regurgitation is mild . No evidence of tricuspid stenosis. Aortic Valve: The aortic valve is normal in structure. Aortic valve regurgitation is not visualized. No aortic stenosis is present. Pulmonic Valve: The pulmonic valve was normal in structure. Pulmonic valve regurgitation is not visualized. No evidence of pulmonic stenosis. Aorta: The aortic root is normal in size and structure. Venous: The inferior vena cava is normal in size with greater than 50% respiratory variability, suggesting right atrial pressure of 3 mmHg. IAS/Shunts: No atrial level shunt detected by color flow Doppler.  LEFT VENTRICLE PLAX 2D LVIDd:         5.50 cm LVIDs:         4.40 cm LV PW:         1.00 cm LV IVS:        0.90 cm LVOT diam:     1.80 cm LV SV:         41 LV SV Index:   20 LVOT Area:     2.54 cm  LEFT ATRIUM             Index       RIGHT ATRIUM           Index LA diam:        3.80 cm 1.92 cm/m  RA Area:     14.00 cm LA Vol (A2C):   53.0 ml 26.71 ml/m RA Volume:   30.90 ml  15.57 ml/m LA Vol (A4C):   47.4 ml  23.89 ml/m LA Biplane Vol: 53.0 ml 26.71 ml/m  AORTIC VALVE LVOT Vmax:   103.17 cm/s LVOT Vmean:  65.633 cm/s LVOT VTI:    0.160 m  AORTA Ao Root diam: 2.80 cm Ao Asc diam:  2.90 cm TRICUSPID VALVE TR Peak grad:   10.4 mmHg TR Vmax:        161.00 cm/s  SHUNTS Systemic VTI:  0.16 m Systemic Diam: 1.80 cm Candee Furbish MD Electronically signed by Candee Furbish MD Signature Date/Time: 01/02/2021/4:06:25 PM    Final     Cardiac Studies   Echo 01/02/21: 1. Left ventricular ejection fraction, by estimation, is 45 to 50%. The  left ventricle has mildly decreased function. The left ventricle  demonstrates global hypokinesis. Left ventricular diastolic parameters  were normal.   2. Right ventricular systolic function is normal. The right ventricular  size is normal. There is normal pulmonary artery systolic pressure.   3. The mitral valve  is normal in structure. Mild mitral valve  regurgitation. No evidence of mitral stenosis.   4. The aortic valve is normal in structure. Aortic valve regurgitation is  not visualized. No aortic stenosis is present.   5. The inferior vena cava is normal in size with greater than 50%  respiratory variability, suggesting right atrial pressure of 3 mmHg.   Patient Profile     Ms. Dismuke is a 55F with diabetes, hypertension, hyperlipidemia, GERD, and morbid obesity admitted with septic shock secondary to cellulitis and left buttock abscess.  Her hospitalization has been complicated by acute oliguric renal failure and encephalopathy.    Cardiology was consulted for atrial flutter with RVR and prolonged QTc.  Assessment & Plan    # Atrial flutter:  New this admission in the setting of critical illness and sepsis. She continues to spike fevers.  She has been on amiodarone and HR improving on metoprolol.  Several doses held 2/2 hypotension and she is now back on Levophed.  Will switch amiodarone to 400 mg VT bid.  She has received 2500 mg so far.  Plan for three days of 400 mg bid then 200 mg daily.  Continue to give metoprolol as BP allows.  Per surgery she isn't going to the OR today but maybe over the weekend.  Plan for TEE/DCCV once she can be on anticoagulation.  Would start heparin as soon as she is OK per surgery.    # Acute systolic and diastolic heart failure:  LVEF 45-50% this admission.  Likely 2/2 acute illness and tachycardia but given her risk factors she will need an ischemic evaluation once medically stable.  BP too low for GDMT.  Continue metoprolol as tolerated for now.   For questions or updates, please contact [employer] Please consult [email or web address] for contact info under        Signed, Skeet Latch, MD  01/04/2021, 9:46 AM

## 2021-01-04 NOTE — Progress Notes (Signed)
Collateral contact: Contacted patient's sister Tona Sensing to assess patient's mental function prior to her gluteal abscess.  She states that her sister was very talkative and friendly.  She denied that her sister had any neurological deficits.  Sister states when patient first got the abscess and prior to her presentation at [facility], she became confused and disoriented.   Sister was also updated on patient's current condition.  Dennie Bible MS4

## 2021-01-04 NOTE — Progress Notes (Signed)
Nutrition Follow-up  DOCUMENTATION CODES:   Not applicable  INTERVENTION:   Tube feeding via OG tube: Increase Vital 1.5 to 60 ml/h (1440 ml per day) Continue Prosource TF 45 ml QID  Provides 2320 kcal, 141 gm protein, 1100 ml free water daily.  Continue B-complex with vitamin C while on CRRT.  NUTRITION DIAGNOSIS:   Increased nutrient needs related to wound healing, acute illness (L buttock abscess; CRRT) as evidenced by estimated needs.  Ongoing  GOAL:   Patient will meet greater than or equal to 90% of their needs   Met with TF  MONITOR:   Vent status, TF tolerance, Labs, Skin  REASON FOR ASSESSMENT:   Ventilator, Consult Enteral/tube feeding initiation and management  ASSESSMENT:   50 yo female admitted from [place] with worsening renal function, necrotizing soft tissue infection of left buttock. PMH includes Bartholin cyst, HTN, DM-2, GERD.   Discussed patient in ICU rounds and with RN today.  CRRT off since yesterday morning. Plans to resume CRRT today d/t increased pressor requirement.  S/P repeat I&D of left buttock 6/9. May require return to the OR Monday.  Patient is tolerating TF at goal rate without difficulty.  Patient remains intubated on ventilator support MV: 8.3 L/min Temp (24hrs), Avg:100.4 F (38 C), Min:99.3 F (37.4 C), Max:101.4 F (38.6 C)    Labs reviewed. Na 130, K 5.6, phos 4.9 CBG: 208-198  Medications reviewed and include B-complex with vitamin C, Novolog, Levemir, Lokelma, Ketamine, Levophed.  Weight down to 98.4 kg today. I/O -1.8 L since admission UOP 355 ml x 24 hours  NUTRITION - FOCUSED PHYSICAL EXAM:  Flowsheet Row Most Recent Value  Orbital Region No depletion  Upper Arm Region No depletion  Thoracic and Lumbar Region No depletion  Buccal Region Unable to assess  Temple Region No depletion  Clavicle Bone Region No depletion  Clavicle and Acromion Bone Region No depletion  Scapular Bone Region No  depletion  Dorsal Hand No depletion  Patellar Region No depletion  Anterior Thigh Region No depletion  Posterior Calf Region No depletion  Edema (RD Assessment) Mild  Hair Reviewed  Eyes Unable to assess  Mouth Unable to assess  Skin Reviewed  Nails Reviewed       Diet Order:   Diet Order             Diet NPO time specified  Diet effective now                   EDUCATION NEEDS:   Not appropriate for education at this time  Skin:  Skin Assessment: Skin Integrity Issues: Skin Integrity Issues:: Other (Comment) Other: L buttock soft tissue infection, S/P debridement  Last BM:  6/9 type 7  Height:   Ht Readings from Last 1 Encounters:  01/01/21 5' 2"  (1.575 m)    Weight:   Wt Readings from Last 1 Encounters:  01/03/21 98.4 kg    Ideal Body Weight:  50 kg  BMI:  Body mass index is 39.68 kg/m.  Estimated Nutritional Needs:   Kcal:  2100-2300  Protein:  130-150 gm  Fluid:  >/= 2 L    Lucas Mallow, RD, LDN, CNSC Please refer to Amion for contact information.

## 2021-01-04 NOTE — Progress Notes (Signed)
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  Intraoperative cultures have grown GBS, E. coli, and Prevotella.  She is currently on piperacillin tazobactam after completing 3 days of IVIG and a course of linezolid for antitoxin effect in the setting of toxic shock syndrome.  She has been febrile overnight with a T-max of 101.4 and is now requiring low-dose norepinephrine this morning. Septic shock: Secondary to #1 with overall improved hemodynamics, however, currently back on Levophed this morning Leukocytosis: Secondary to #1 and improving Fevers: New onset fevers overnight may be postoperative, however she does have indwelling lines and tubes AKI: Was previously on CRRT.  Nephrology is following and will consider transition to intermittent HD depending on her progress.  Creatinine this morning is 2.01. Ventilatory dependent respiratory failure: Remains intubated Diabetes, obesity Atrial flutter with RVR: Cardiology following  PLAN:    Continue piperacillin tazobactam which covers bacteria isolated thus far Follow up any further surgical debridement plans Check blood cultures x2, UA, chest x-ray given new fevers overnight Wound care, glycemic control Rest of care per primary team and other specialist    Principal Problem:   Necrotizing soft tissue infection Active Problems:   Septic shock ([place])   Sepsis due to Streptococcus, group B ([place])   Acute kidney injury ([place])   Obesity   Type 2 diabetes mellitus with hyperglycemia ([place])   Acute hypercapnic respiratory failure ([facility])   Typical atrial flutter  ([place])    MEDICATIONS:    Scheduled Meds: . B-complex with vitamin C  1 tablet Per Tube Daily  . chlorhexidine gluconate (MEDLINE KIT)  15 mL Mouth Rinse BID  . Chlorhexidine Gluconate Cloth  6 each Topical Q0600  . clonazepam  1 mg Per Tube TID  . docusate  100 mg Per Tube BID  . feeding supplement (PROSource TF)  45 mL Per Tube QID  . heparin  5,000 Units Subcutaneous Q8H  . insulin aspart  0-20 Units Subcutaneous Q4H  . insulin aspart  3 Units Subcutaneous Q4H  . insulin detemir  14 Units Subcutaneous BID  . mouth rinse  15 mL Mouth Rinse 10 times per day  . oxyCODONE  10 mg Per Tube Q4H  . pantoprazole sodium  40 mg Per Tube Daily  . polyethylene glycol  17 g Per Tube Daily  . QUEtiapine  25 mg Per Tube QHS  . sodium chloride flush  10-40 mL Intracatheter Q12H  . sodium hypochlorite   Irrigation Once  . sodium hypochlorite   Irrigation BID  . sodium zirconium cyclosilicate  10 g Per Tube Daily   Continuous Infusions: . sodium chloride Stopped (01/01/21 2325)  . amiodarone 30 mg/hr (01/04/21 0907)  . feeding supplement (VITAL 1.5 CAL) 1,000 mL (01/03/21 1930)  . fentaNYL infusion INTRAVENOUS 300 mcg/hr (01/04/21 0800)  . norepinephrine (LEVOPHED) Adult infusion 10 mcg/min (01/04/21 0008)  . piperacillin-tazobactam (ZOSYN)  IV 12.5 mL/hr at 01/04/21 0800   PRN Meds:.acetaminophen, fentaNYL, levalbuterol, midazolam, ondansetron (ZOFRAN) IV, polyethylene glycol, sodium chloride flush  SUBJECTIVE:   24 hour events:  Febrile overnight.  T-max 101.4 WBC improving Creatinine worsened, creatinine clearance  73 Chest x-ray yesterday reviewed is stable No other new imaging, no new micro   Review of Systems  Unable to perform ROS: Intubated     OBJECTIVE:   Blood pressure 108/65, pulse (!) 111, temperature 100.3 F (37.9 C), temperature source Oral, resp. rate (!) 25, height _0  (1.575 m), weight 98.4 kg, SpO2 95 %. Body mass index is 39.68 kg/m.  Physical  Exam Constitutional:      Comments: Ill-appearing woman, lying in bed, on the ventilator  HENT:     Head: Normocephalic and atraumatic.  Eyes:     Extraocular Movements: Extraocular movements intact.     Conjunctiva/sclera: Conjunctivae normal.  Neck:     Comments: ET tube in place Right IJ HD line Cardiovascular:     Rate and Rhythm: Tachycardia present.  Pulmonary:     Comments: Ventilated breath sounds, diminished at the bases, symmetric chest rise and fall Abdominal:     Palpations: Abdomen is soft.     Tenderness: There is no abdominal tenderness. There is no guarding or rebound.  Genitourinary:    Comments: Foley in place Rectal tube in place Neurological:     Comments: Sedated, does not follow commands for me    Lines: Right IJ HD cath 6/3 Foley 6/7  Lab Results: Lab Results  Component Value Date   WBC 15.1 (H) 01/04/2021   HGB 8.3 (L) 01/04/2021   HCT 24.5 (L) 01/04/2021   MCV 93.9 01/04/2021   PLT 165 01/04/2021    Lab Results  Component Value Date   NA 130 (L) 01/04/2021   K 5.6 (H) 01/04/2021   CO2 21 (L) 01/04/2021   GLUCOSE 208 (H) 01/04/2021   BUN 33 (H) 01/04/2021   CREATININE 2.01 (H) 01/04/2021   CALCIUM 7.5 (L) 01/04/2021   GFRNONAA 30 (L) 01/04/2021    Lab Results  Component Value Date   ALT 20 01/04/2021   AST 34 01/04/2021   ALKPHOS 374 (H) 01/04/2021   BILITOT 1.4 (H) 01/04/2021    No results found for: CRP  No results found for: ESRSEDRATE   I have reviewed the micro and lab results in Epic.  Imaging: DG Chest Port 1 View  Result Date: 01/03/2021 CLINICAL DATA:  Respiratory failure. EXAM: PORTABLE CHEST 1 VIEW COMPARISON:  CT 12/31/2020.  12/30/2020. FINDINGS: Endotracheal tube, NG tube, right IJ line stable position. Stable cardiomegaly. Mild bibasilar atelectasis again noted. No pleural effusion or pneumothorax. IMPRESSION: 1.  Lines and tubes in stable position. 2. Stable cardiomegaly. Mild bibasilar atelectasis. Chest is  stable from prior exam. Electronically Signed   By: Marcello Moores  Register   On: 01/03/2021 07:46   ECHOCARDIOGRAM COMPLETE  Result Date: 01/02/2021    ECHOCARDIOGRAM REPORT   Patient Name:   [name] [name] Date of Exam: 01/02/2021 Medical Rec #:  [id number]       Height:       62.0 in Accession #:    [id number]      Weight:       218.3 lb Date of Birth:  05-07-1971       BSA:          1.984 m Patient Age:    75 years        BP:           102/72 mmHg Patient Gender: F               HR:           100 bpm.  Exam Location:  Inpatient Procedure: 2D Echo, Color Doppler, Cardiac Doppler and Intracardiac            Opacification Agent Indications:    Dyspnea  History:        Patient has no prior history of Echocardiogram examinations.                 Risk Factors:Current Smoker, Dyslipidemia and Diabetes.  Sonographer:    Bernadene Person RDCS Referring Phys: [place]  1. Left ventricular ejection fraction, by estimation, is 45 to 50%. The left ventricle has mildly decreased function. The left ventricle demonstrates global hypokinesis. Left ventricular diastolic parameters were normal.  2. Right ventricular systolic function is normal. The right ventricular size is normal. There is normal pulmonary artery systolic pressure.  3. The mitral valve is normal in structure. Mild mitral valve regurgitation. No evidence of mitral stenosis.  4. The aortic valve is normal in structure. Aortic valve regurgitation is not visualized. No aortic stenosis is present.  5. The inferior vena cava is normal in size with greater than 50% respiratory variability, suggesting right atrial pressure of 3 mmHg. FINDINGS  Left Ventricle: Left ventricular ejection fraction, by estimation, is 45 to 50%. The left ventricle has mildly decreased function. The left ventricle demonstrates global hypokinesis. Definity contrast agent was given IV to delineate the left ventricular  endocardial borders. The left ventricular internal cavity  size was normal in size. There is no left ventricular hypertrophy. Left ventricular diastolic parameters were normal. Right Ventricle: The right ventricular size is normal. No increase in right ventricular wall thickness. Right ventricular systolic function is normal. There is normal pulmonary artery systolic pressure. The tricuspid regurgitant velocity is 1.61 m/s, and  with an assumed right atrial pressure of 3 mmHg, the estimated right ventricular systolic pressure is 44.8 mmHg. Left Atrium: Left atrial size was normal in size. Right Atrium: Right atrial size was normal in size. Pericardium: There is no evidence of pericardial effusion. Mitral Valve: The mitral valve is normal in structure. Mild mitral valve regurgitation. No evidence of mitral valve stenosis. Tricuspid Valve: The tricuspid valve is normal in structure. Tricuspid valve regurgitation is mild . No evidence of tricuspid stenosis. Aortic Valve: The aortic valve is normal in structure. Aortic valve regurgitation is not visualized. No aortic stenosis is present. Pulmonic Valve: The pulmonic valve was normal in structure. Pulmonic valve regurgitation is not visualized. No evidence of pulmonic stenosis. Aorta: The aortic root is normal in size and structure. Venous: The inferior vena cava is normal in size with greater than 50% respiratory variability, suggesting right atrial pressure of 3 mmHg. IAS/Shunts: No atrial level shunt detected by color flow Doppler.  LEFT VENTRICLE PLAX 2D LVIDd:         5.50 cm LVIDs:         4.40 cm LV PW:         1.00 cm LV IVS:        0.90 cm LVOT diam:     1.80 cm LV SV:         41 LV SV Index:   20 LVOT Area:     2.54 cm  LEFT ATRIUM             Index       RIGHT ATRIUM           Index LA diam:        3.80 cm 1.92 cm/m  RA Area:  14.00 cm LA Vol (A2C):   53.0 ml 26.71 ml/m RA Volume:   30.90 ml  15.57 ml/m LA Vol (A4C):   47.4 ml 23.89 ml/m LA Biplane Vol: 53.0 ml 26.71 ml/m  AORTIC VALVE LVOT Vmax:   103.17  cm/s LVOT Vmean:  65.633 cm/s LVOT VTI:    0.160 m  AORTA Ao Root diam: 2.80 cm Ao Asc diam:  2.90 cm TRICUSPID VALVE TR Peak grad:   10.4 mmHg TR Vmax:        161.00 cm/s  SHUNTS Systemic VTI:  0.16 m Systemic Diam: 1.80 cm Candee Furbish MD Electronically signed by Candee Furbish MD Signature Date/Time: 01/02/2021/4:06:25 PM    Final      Imaging independently reviewed in Epic.    [facility]ious [facility] [phone] pager 01/04/2021, 9:40 AM

## 2021-01-04 NOTE — Progress Notes (Signed)
1 Day Post-Op   Subjective/Chief Complaint: On low dose levo this morning, TMAX 101.4 yesterday, 100.3 this AM WBC 23 >>15 >>15 today  Objective: Vital signs in last 24 hours: Temp:  [98.3 F (36.8 C)-101.4 F (38.6 C)] 100.3 F (37.9 C) (06/10 0803) Pulse Rate:  [105-120] 111 (06/10 0800) Resp:  [17-30] 25 (06/10 0800) BP: (76-155)/(41-87) 108/65 (06/10 0800) SpO2:  [90 %-100 %] 95 % (06/10 0800) FiO2 (%):  [30 %] 30 % (06/10 0800) Last BM Date: 01/03/21  Intake/Output from previous day: 06/09 0701 - 06/10 0700 In: 2462.1 [I.V.:2073.6; IV Piggyback:388.4] Out: 455 [Urine:355; Blood:100] Intake/Output this shift: Total I/O In: 194.2 [I.V.:76.6; NG/GT:105; IV Piggyback:12.5] Out: 85 [Urine:85]  PE:  Constitutional: No acute distress, sedated, appears states age. Eyes: Anicteric sclerae, moist conjunctiva, no lid lag Lungs: ventilated respirations CV: tachycardic 110 bpm, GI: Soft, obese, no masses or hepatosplenomegaly, non-tender to palpation Skin: left buttock wound as below - improved compared to previous. Base >50% fibrinous exudate, peri-wound in tact and viable, there is a tunnel anteriorly towards the left lalbia about 6 cm - packed with NS moistened kerlix gauze.     Lab Results:  Recent Labs    01/03/21 0638 01/04/21 0149  WBC 15.4* 15.1*  HGB 8.7* 8.3*  HCT 26.3* 24.5*  PLT PLATELET CLUMPS NOTED ON SMEAR, COUNT APPEARS DECREASED 165   BMET Recent Labs    01/03/21 0638 01/04/21 0149  NA 132* 130*  K 4.3 5.6*  CL 98 101  CO2 25 21*  GLUCOSE 69* 208*  BUN 20 33*  CREATININE 1.22* 2.01*  CALCIUM 7.5* 7.5*   PT/INR No results for input(s): LABPROT, INR in the last 72 hours. ABG No results for input(s): PHART, HCO3 in the last 72 hours.  Invalid input(s): PCO2, PO2   Studies/Results: DG Chest Port 1 View  Result Date: 01/03/2021 CLINICAL DATA:  Respiratory failure. EXAM: PORTABLE CHEST 1 VIEW COMPARISON:  CT 12/31/2020.  12/30/2020.  FINDINGS: Endotracheal tube, NG tube, right IJ line stable position. Stable cardiomegaly. Mild bibasilar atelectasis again noted. No pleural effusion or pneumothorax. IMPRESSION: 1.  Lines and tubes in stable position. 2. Stable cardiomegaly. Mild bibasilar atelectasis. Chest is stable from prior exam. Electronically Signed   By: Maisie Fus  Register   On: 01/03/2021 07:46   ECHOCARDIOGRAM COMPLETE  Result Date: 01/02/2021    ECHOCARDIOGRAM REPORT   Patient Name:   [name] [name] Date of Exam: 01/02/2021 Medical Rec #:  [id number]       Height:       62.0 in Accession #:    [id number]      Weight:       218.3 lb Date of Birth:  [birth year]       BSA:          1.984 m Patient Age:    50 years        BP:           102/72 mmHg Patient Gender: F               HR:           100 bpm. Exam Location:  Inpatient Procedure: 2D Echo, Color Doppler, Cardiac Doppler and Intracardiac            Opacification Agent Indications:    Dyspnea  History:        Patient has no prior history of Echocardiogram examinations.  Risk Factors:Current Smoker, Dyslipidemia and Diabetes.  Sonographer:    Eulah Pont RDCS Referring Phys: Dixon Boos DOUGLAS B MCQUAID IMPRESSIONS  1. Left ventricular ejection fraction, by estimation, is 45 to 50%. The left ventricle has mildly decreased function. The left ventricle demonstrates global hypokinesis. Left ventricular diastolic parameters were normal.  2. Right ventricular systolic function is normal. The right ventricular size is normal. There is normal pulmonary artery systolic pressure.  3. The mitral valve is normal in structure. Mild mitral valve regurgitation. No evidence of mitral stenosis.  4. The aortic valve is normal in structure. Aortic valve regurgitation is not visualized. No aortic stenosis is present.  5. The inferior vena cava is normal in size with greater than 50% respiratory variability, suggesting right atrial pressure of 3 mmHg. FINDINGS  Left Ventricle: Left ventricular  ejection fraction, by estimation, is 45 to 50%. The left ventricle has mildly decreased function. The left ventricle demonstrates global hypokinesis. Definity contrast agent was given IV to delineate the left ventricular  endocardial borders. The left ventricular internal cavity size was normal in size. There is no left ventricular hypertrophy. Left ventricular diastolic parameters were normal. Right Ventricle: The right ventricular size is normal. No increase in right ventricular wall thickness. Right ventricular systolic function is normal. There is normal pulmonary artery systolic pressure. The tricuspid regurgitant velocity is 1.61 m/s, and  with an assumed right atrial pressure of 3 mmHg, the estimated right ventricular systolic pressure is 13.4 mmHg. Left Atrium: Left atrial size was normal in size. Right Atrium: Right atrial size was normal in size. Pericardium: There is no evidence of pericardial effusion. Mitral Valve: The mitral valve is normal in structure. Mild mitral valve regurgitation. No evidence of mitral valve stenosis. Tricuspid Valve: The tricuspid valve is normal in structure. Tricuspid valve regurgitation is mild . No evidence of tricuspid stenosis. Aortic Valve: The aortic valve is normal in structure. Aortic valve regurgitation is not visualized. No aortic stenosis is present. Pulmonic Valve: The pulmonic valve was normal in structure. Pulmonic valve regurgitation is not visualized. No evidence of pulmonic stenosis. Aorta: The aortic root is normal in size and structure. Venous: The inferior vena cava is normal in size with greater than 50% respiratory variability, suggesting right atrial pressure of 3 mmHg. IAS/Shunts: No atrial level shunt detected by color flow Doppler.  LEFT VENTRICLE PLAX 2D LVIDd:         5.50 cm LVIDs:         4.40 cm LV PW:         1.00 cm LV IVS:        0.90 cm LVOT diam:     1.80 cm LV SV:         41 LV SV Index:   20 LVOT Area:     2.54 cm  LEFT ATRIUM              Index       RIGHT ATRIUM           Index LA diam:        3.80 cm 1.92 cm/m  RA Area:     14.00 cm LA Vol (A2C):   53.0 ml 26.71 ml/m RA Volume:   30.90 ml  15.57 ml/m LA Vol (A4C):   47.4 ml 23.89 ml/m LA Biplane Vol: 53.0 ml 26.71 ml/m  AORTIC VALVE LVOT Vmax:   103.17 cm/s LVOT Vmean:  65.633 cm/s LVOT VTI:    0.160 m  AORTA Ao Root  diam: 2.80 cm Ao Asc diam:  2.90 cm TRICUSPID VALVE TR Peak grad:   10.4 mmHg TR Vmax:        161.00 cm/s  SHUNTS Systemic VTI:  0.16 m Systemic Diam: 1.80 cm Donato Schultz MD Electronically signed by Donato Schultz MD Signature Date/Time: 01/02/2021/4:06:25 PM    Final     Anti-infectives: Anti-infectives (From admission, onward)    Start     Dose/Rate Route Frequency Ordered Stop   01/03/21 1400  piperacillin-tazobactam (ZOSYN) IVPB 3.375 g        3.375 g 12.5 mL/hr over 240 Minutes Intravenous Every 8 hours 01/03/21 1124     01/01/21 1800  piperacillin-tazobactam (ZOSYN) IVPB 3.375 g  Status:  Discontinued        3.375 g 12.5 mL/hr over 240 Minutes Intravenous Every 6 hours 01/01/21 1059 01/03/21 1124   12/31/20 1400  piperacillin-tazobactam (ZOSYN) IVPB 3.375 g  Status:  Discontinued        3.375 g 12.5 mL/hr over 240 Minutes Intravenous Every 6 hours 12/31/20 0918 01/01/21 1059   12/29/20 1400  clindamycin (CLEOCIN) IVPB 900 mg  Status:  Discontinued        900 mg 100 mL/hr over 30 Minutes Intravenous Every 8 hours 12/29/20 1214 12/29/20 1234   12/28/20 2200  linezolid (ZYVOX) IVPB 600 mg  Status:  Discontinued        600 mg 300 mL/hr over 60 Minutes Intravenous Every 12 hours 12/28/20 1620 01/02/21 0920   12/28/20 1830  meropenem (MERREM) 1 g in sodium chloride 0.9 % 100 mL IVPB  Status:  Discontinued        1 g 200 mL/hr over 30 Minutes Intravenous Every 8 hours 12/28/20 1731 12/31/20 0918   12/28/20 1800  meropenem (MERREM) 1 g in sodium chloride 0.9 % 100 mL IVPB  Status:  Discontinued        1 g 200 mL/hr over 30 Minutes Intravenous Every 24 hours  12/28/20 1700 12/28/20 1731   12/28/20 1730  meropenem (MERREM) 1 g in sodium chloride 0.9 % 100 mL IVPB  Status:  Discontinued        1 g 200 mL/hr over 30 Minutes Intravenous Every 8 hours 12/28/20 1639 12/28/20 1700       Assessment/Plan: Necrotizing soft tissue infection left buttock  POD#7 S/P DEBRIDEMENT LEFT BUTTOCK NECROTIZING SOFT TISSUE INFECTION - TOTAL TISSUE VOLUME 14CM X 12CM X 3CM DEEP 12/28/2020  Dr. Violeta Gelinas POD#4 S/P INCISION AND DRAINAGE ABSCESS BUTTOCK (Left), 16x15x3cm wound; 6/6 Dr. Derrell Lolling POD#1 S/P IRRIGATION AND DEBRIDEMENT LEFT BUTTOCK 6/9 Dr. Derrell Lolling  - low grade temps, WBC stable   - Intraoperative cultures 6/3 with GBS, E. coli, and Prevotella - Dakins dressing changes were started intra-operatively 6/6 due to suspicion for pseudomonas; stop dakins and continue moist-to-dry dressing changes.  - continue TF - continue IV abx per ID  - patient may need to return to OR Monday 6/13 for further debridement. Currently has improving hemodynamics - still intermittently requiring low dose levo. Worsening sepsis or wound concerns would warrant take back to OR over the weekend.   Below per CCM:  VDRF Septic shock Renal failure  DM2 Obesity    FEN: NPO, IVF, TF ID:  merrem 6/3-6/6, linezolid 6/3-6/8, Zosyn 6/6 >> abx per ID VTE: SCD's, SQH  Foley: in place for strict I&O's, placed 6/3 Dispo: ICU    LOS: 7 days    Adam Phenix 01/04/2021

## 2021-01-04 NOTE — Progress Notes (Signed)
Patient ID: [name] [name], female   DOB: 05/19/1971, 50 y.o.   MRN: [id number] S: Developed fever overnight and placed back ton pressors. O:BP 108/65 (BP Location: Left Leg)   Pulse (!) 111   Temp 100.3 F (37.9 C) (Oral)   Resp (!) 25   Ht _0  (1.575 m)   Wt 98.4 kg   SpO2 95%   BMI 39.68 kg/m   Intake/Output Summary (Last 24 hours) at 01/04/2021 1039 Last data filed at 01/04/2021 1021 Gross per 24 hour  Intake 2318.79 ml  Output 410 ml  Net 1908.79 ml   Intake/Output: I/O last 3 completed shifts: In: 3882.3 [I.V.:3146.4; NG/GT:250; IV Piggyback:485.9] Out: 2705 [Urine:445; Other:1960; Stool:200; Blood:100]  Intake/Output this shift:  Total I/O In: 204.2 [I.V.:86.6; NG/GT:105; IV Piggyback:12.5] Out: 85 [Urine:85] Weight change:  URK:YHCWCBJSE and sedated GBT:DVVOH Resp: CTA Abd:+BS,soft, NT/ND Ext: no edema  Recent Labs  Lab 12/28/20 2326 12/29/20 0217 12/29/20 0253 12/29/20 1133 12/30/20 0420 12/30/20 1601 12/31/20 0330 12/31/20 0753 12/31/20 1600 01/01/21 0348 01/01/21 1530 01/02/21 0510 01/02/21 1545 01/03/21 0638 01/04/21 0149  NA 132*   < > 135   < > 129*   < > 131*   < > 130* 128* 128* 129*  130* 130* 132* 130*  K 3.8   < > 3.6   < > 3.5   < > 3.6   < > 3.8 3.9 4.2 4.5  4.6 4.5 4.3 5.6*  CL 102  --  100   < > 93*   < > 97*  --  100 98 99 100  100 100 98 101  CO2 16*  --  16*   < > 28   < > 26  --  _1 21*  GLUCOSE 200*  --  185*   < > 237*   < > 268*  --  173* 212* 163* 194*  194* 168* 69* 208*  BUN 43*  --  35*   < > 27*   < > 27*  --  28* 26* 23* 23*  23* 22* 20 33*  CREATININE 5.55*  --  4.66*   < > 2.58*   < > 1.92*  --  1.83* 1.62* 1.46* 1.43*  1.44* 1.36* 1.22* 2.01*  ALBUMIN 1.6*  --  1.4*   < > 1.1*   < > 1.1*  --  1.1* <1.0* 1.0* 1.1*  1.1* 1.0* 1.1* 1.3*  CALCIUM 8.2*  --  7.9*   < > 7.0*   < > 7.3*  --  7.3* 6.9* 7.2* 7.6*  7.5* 7.2* 7.5* 7.5*  PHOS  --   --   --    < > 2.4*   < >  --   --  3.7 3.6 3.3 3.2 3.3  2.8 4.9*  AST 40  --  35  --  29  --  41  --   --   --   --  42*  --  38 34  ALT 20  --  19  --  14  --  19  --   --   --   --  21  --  21 20   < > = values in this interval not displayed.   Liver Function Tests: Recent Labs  Lab 01/02/21 0510 01/02/21 1545 01/03/21 0638 01/04/21 0149  AST 42*  --  38 34  ALT 21  --  21 20  ALKPHOS 408*  --  389* 374*  BILITOT 2.0*  --  1.6* 1.4*  PROT 7.8  --  8.0 7.6  ALBUMIN 1.1*  1.1* 1.0* 1.1* 1.3*   No results for input(s): LIPASE, AMYLASE in the last 168 hours. No results for input(s): AMMONIA in the last 168 hours. CBC: Recent Labs  Lab 12/30/20 0420 12/31/20 0330 12/31/20 0753 01/02/21 0510 01/03/21 0626 01/04/21 0149  WBC 44.0* 35.1*  --  23.5* 15.4* 15.1*  NEUTROABS 38.4* 29.9*  --  19.1* 11.5*  --   HGB 9.8* 9.8*   < > 7.3* 8.7* 8.3*  HCT 27.2* 27.7*   < > 22.2* 26.3* 24.5*  MCV 87.5 90.2  --  94.9 94.9 93.9  PLT 149* 144*  --  PLATELET CLUMPS NOTED ON SMEAR, UNABLE TO ESTIMATE PLATELET CLUMPS NOTED ON SMEAR, COUNT APPEARS DECREASED 165   < > = values in this interval not displayed.   Cardiac Enzymes: No results for input(s): CKTOTAL, CKMB, CKMBINDEX, TROPONINI in the last 168 hours. CBG: Recent Labs  Lab 01/03/21 1514 01/03/21 1905 01/03/21 2301 01/04/21 0301 01/04/21 0802  GLUCAP 136* 131* 140* 208* 198*    Iron Studies: No results for input(s): IRON, TIBC, TRANSFERRIN, FERRITIN in the last 72 hours. Studies/Results: DG Chest Port 1 View  Result Date: 01/03/2021 CLINICAL DATA:  Respiratory failure. EXAM: PORTABLE CHEST 1 VIEW COMPARISON:  CT 12/31/2020.  12/30/2020. FINDINGS: Endotracheal tube, NG tube, right IJ line stable position. Stable cardiomegaly. Mild bibasilar atelectasis again noted. No pleural effusion or pneumothorax. IMPRESSION: 1.  Lines and tubes in stable position. 2. Stable cardiomegaly. Mild bibasilar atelectasis. Chest is stable from prior exam. Electronically Signed   By: Marcello Moores  Register   On:  01/03/2021 07:46   ECHOCARDIOGRAM COMPLETE  Result Date: 01/02/2021    ECHOCARDIOGRAM REPORT   Patient Name:   [name] [name] Date of Exam: 01/02/2021 Medical Rec #:  [id number]       Height:       62.0 in Accession #:    [id number]      Weight:       218.3 lb Date of Birth:  1970-11-17       BSA:          1.984 m Patient Age:    50 years        BP:           102/72 mmHg Patient Gender: F               HR:           100 bpm. Exam Location:  Inpatient Procedure: 2D Echo, Color Doppler, Cardiac Doppler and Intracardiac            Opacification Agent Indications:    Dyspnea  History:        Patient has no prior history of Echocardiogram examinations.                 Risk Factors:Current Smoker, Dyslipidemia and Diabetes.  Sonographer:    Bernadene Person RDCS Referring Phys: [place]  1. Left ventricular ejection fraction, by estimation, is 45 to 50%. The left ventricle has mildly decreased function. The left ventricle demonstrates global hypokinesis. Left ventricular diastolic parameters were normal.  2. Right ventricular systolic function is normal. The right ventricular size is normal. There is normal pulmonary artery systolic pressure.  3. The mitral valve is normal in structure. Mild mitral valve regurgitation. No evidence of mitral stenosis.  4. The aortic valve  is normal in structure. Aortic valve regurgitation is not visualized. No aortic stenosis is present.  5. The inferior vena cava is normal in size with greater than 50% respiratory variability, suggesting right atrial pressure of 3 mmHg. FINDINGS  Left Ventricle: Left ventricular ejection fraction, by estimation, is 45 to 50%. The left ventricle has mildly decreased function. The left ventricle demonstrates global hypokinesis. Definity contrast agent was given IV to delineate the left ventricular  endocardial borders. The left ventricular internal cavity size was normal in size. There is no left ventricular hypertrophy. Left  ventricular diastolic parameters were normal. Right Ventricle: The right ventricular size is normal. No increase in right ventricular wall thickness. Right ventricular systolic function is normal. There is normal pulmonary artery systolic pressure. The tricuspid regurgitant velocity is 1.61 m/s, and  with an assumed right atrial pressure of 3 mmHg, the estimated right ventricular systolic pressure is 35.3 mmHg. Left Atrium: Left atrial size was normal in size. Right Atrium: Right atrial size was normal in size. Pericardium: There is no evidence of pericardial effusion. Mitral Valve: The mitral valve is normal in structure. Mild mitral valve regurgitation. No evidence of mitral valve stenosis. Tricuspid Valve: The tricuspid valve is normal in structure. Tricuspid valve regurgitation is mild . No evidence of tricuspid stenosis. Aortic Valve: The aortic valve is normal in structure. Aortic valve regurgitation is not visualized. No aortic stenosis is present. Pulmonic Valve: The pulmonic valve was normal in structure. Pulmonic valve regurgitation is not visualized. No evidence of pulmonic stenosis. Aorta: The aortic root is normal in size and structure. Venous: The inferior vena cava is normal in size with greater than 50% respiratory variability, suggesting right atrial pressure of 3 mmHg. IAS/Shunts: No atrial level shunt detected by color flow Doppler.  LEFT VENTRICLE PLAX 2D LVIDd:         5.50 cm LVIDs:         4.40 cm LV PW:         1.00 cm LV IVS:        0.90 cm LVOT diam:     1.80 cm LV SV:         41 LV SV Index:   20 LVOT Area:     2.54 cm  LEFT ATRIUM             Index       RIGHT ATRIUM           Index LA diam:        3.80 cm 1.92 cm/m  RA Area:     14.00 cm LA Vol (A2C):   53.0 ml 26.71 ml/m RA Volume:   30.90 ml  15.57 ml/m LA Vol (A4C):   47.4 ml 23.89 ml/m LA Biplane Vol: 53.0 ml 26.71 ml/m  AORTIC VALVE LVOT Vmax:   103.17 cm/s LVOT Vmean:  65.633 cm/s LVOT VTI:    0.160 m  AORTA Ao Root diam:  2.80 cm Ao Asc diam:  2.90 cm TRICUSPID VALVE TR Peak grad:   10.4 mmHg TR Vmax:        161.00 cm/s  SHUNTS Systemic VTI:  0.16 m Systemic Diam: 1.80 cm Candee Furbish MD Electronically signed by Candee Furbish MD Signature Date/Time: 01/02/2021/4:06:25 PM    Final     [START ON 01/07/2021] amiodarone  200 mg Per Tube Q1200   amiodarone  400 mg Per Tube BID   B-complex with vitamin C  1 tablet Per Tube Daily   chlorhexidine gluconate (  MEDLINE KIT)  15 mL Mouth Rinse BID   Chlorhexidine Gluconate Cloth  6 each Topical Q0600   clonazepam  1 mg Per Tube TID   docusate  100 mg Per Tube BID   feeding supplement (PROSource TF)  45 mL Per Tube QID   heparin  5,000 Units Subcutaneous Q8H   insulin aspart  0-20 Units Subcutaneous Q4H   insulin aspart  3 Units Subcutaneous Q4H   insulin detemir  14 Units Subcutaneous BID   mouth rinse  15 mL Mouth Rinse 10 times per day   oxyCODONE  10 mg Per Tube Q4H   pantoprazole sodium  40 mg Per Tube Daily   polyethylene glycol  17 g Per Tube Daily   QUEtiapine  25 mg Per Tube QHS   sodium chloride flush  10-40 mL Intracatheter Q12H   sodium hypochlorite   Irrigation Once   sodium zirconium cyclosilicate  10 g Per Tube Daily    BMET    Component Value Date/Time   NA 130 (L) 01/04/2021 0149   K 5.6 (H) 01/04/2021 0149   CL 101 01/04/2021 0149   CO2 21 (L) 01/04/2021 0149   GLUCOSE 208 (H) 01/04/2021 0149   BUN 33 (H) 01/04/2021 0149   CREATININE 2.01 (H) 01/04/2021 0149   CALCIUM 7.5 (L) 01/04/2021 0149   GFRNONAA 30 (L) 01/04/2021 0149   CBC    Component Value Date/Time   WBC 15.1 (H) 01/04/2021 0149   RBC 2.61 (L) 01/04/2021 0149   HGB 8.3 (L) 01/04/2021 0149   HCT 24.5 (L) 01/04/2021 0149   PLT 165 01/04/2021 0149   MCV 93.9 01/04/2021 0149   MCH 31.8 01/04/2021 0149   MCHC 33.9 01/04/2021 0149   RDW 14.7 01/04/2021 0149   LYMPHSABS 2.2 01/03/2021 0638   MONOABS 1.3 (H) 01/03/2021 0638   EOSABS 0.1 01/03/2021 0638   BASOSABS 0.1 01/03/2021 5638     Assessment/Plan:   AKI, oliguric/anuric - presumably ischemic ATN in setting of severe sepsis syndrome and hypotension requiring multiple pressors.  She was started on CRRT 12/28/20 vir RIJ HD catheter placed by PCCM.  Remains anuric and on CRRT.   Taken off of CVVHD yesterday with plan to transition for IHD, however now with fevers and hypotension requiring pressors again. Plan to resume CRRT and follow. Starting to make some urine but still oliguric 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 - currently off levophed, s/p debridement on 12/28/20, 12/31/20, and 01/03/21 Spiked fever last night following debridement and back on pressors HD catheter is only 38 days old and unlikely source but ID following and await recommendations.  May need to replace. Hyperkalemia - will restart CRRT as above. VDRF - per PCCM Atrial flutter with RVR:  Cardiology following.  On amiodarone and off levophed. DM type 2 - per primary Acute metabolic encephalopathy Anemia of critical illness - transfuse prn. Hypophosphatemia - repleted IV and follow.  Donetta Potts, MD [employer] [phone]

## 2021-01-04 NOTE — Progress Notes (Signed)
Progress Note  Patient Name: [name] [name] Date of Encounter: 01/05/2021  [facility] Cardiologist: Skeet Latch, MD (new)  Subjective   Remains in Afib with HR 100-120s Blood pressures 80-100s, MAPs 70-80s Levophed off this AM Remains intubated and sedated On CRRT 1L UOP  Inpatient Medications    Scheduled Meds:  [START ON 01/07/2021] amiodarone  200 mg Per Tube Q1200   amiodarone  400 mg Per Tube BID   B-complex with vitamin C  1 tablet Per Tube Daily   chlorhexidine gluconate (MEDLINE KIT)  15 mL Mouth Rinse BID   Chlorhexidine Gluconate Cloth  6 each Topical Q0600   clonazepam  1 mg Per Tube TID   docusate  100 mg Per Tube BID   feeding supplement (PROSource TF)  45 mL Per Tube QID   heparin  5,000 Units Subcutaneous Q8H   insulin aspart  0-20 Units Subcutaneous Q4H   insulin aspart  3 Units Subcutaneous Q4H   insulin detemir  14 Units Subcutaneous BID   mouth rinse  15 mL Mouth Rinse 10 times per day   oxyCODONE  10 mg Per Tube Q4H   pantoprazole sodium  40 mg Per Tube Daily   polyethylene glycol  17 g Per Tube Daily   QUEtiapine  25 mg Per Tube QHS   sodium chloride flush  10-40 mL Intracatheter Q12H   sodium hypochlorite   Irrigation Once   Continuous Infusions:  sodium chloride Stopped (01/01/21 2325)   feeding supplement (VITAL 1.5 CAL) 1,000 mL (01/04/21 1828)   fentaNYL infusion INTRAVENOUS 200 mcg/hr (01/05/21 0700)   norepinephrine (LEVOPHED) Adult infusion 10 mcg/min (01/04/21 0008)   piperacillin-tazobactam Stopped (01/05/21 0645)   prismasol BGK 2/2.5 replacement solution 500 mL/hr at 01/05/21 0035   prismasol BGK 2/2.5 replacement solution 300 mL/hr at 01/05/21 0035   prismasol BGK 4/2.5 1,500 mL/hr at 01/05/21 0408   PRN Meds: acetaminophen, fentaNYL, heparin, heparin, levalbuterol, midazolam, ondansetron (ZOFRAN) IV, polyethylene glycol, sodium chloride flush   Vital Signs    Vitals:   01/05/21 0615 01/05/21 0630 01/05/21 0700  01/05/21 0800  BP: 120/72 122/85 109/64   Pulse: (!) 112 (!) 115 (!) 115   Resp: _0 Temp:    (!) 97 F (36.1 C)  TempSrc:    Axillary  SpO2: 100% 100% 100%   Weight:      Height:        Intake/Output Summary (Last 24 hours) at 01/05/2021 0802 Last data filed at 01/05/2021 0700 Gross per 24 hour  Intake 2228.97 ml  Output 2189 ml  Net 39.97 ml   Last 3 Weights 01/05/2021 01/04/2021 01/03/2021  Weight (lbs) 219 lb 5.7 oz 217 lb 9.5 oz 216 lb 14.9 oz  Weight (kg) 99.5 kg 98.7 kg 98.4 kg      Telemetry    Aflutter with HR 90-120.- Personally Reviewed  ECG    No new tracing - Personally Reviewed  Physical Exam   VS:  BP 109/64   Pulse (!) 115   Temp (!) 97 F (36.1 C) (Axillary)   Resp 16   Ht 5' 2" (1.575 m)   Wt 99.5 kg   SpO2 100%   BMI 40.12 kg/m  , BMI Body mass index is 40.12 kg/m. GENERAL: Critically ill appearing, intubated and sedated HEENT: Pupils equal round and reactive, fundi not visualized, ET tube in place NECK:  JVD difficult to assess due to body habitus; no carotid bruits LUNGS:  Coarse vent  sounds HEART: Tachycardic.  Regular rhythm. PMI not displaced or sustained,S1 and S2 within normal limits, no S3, no S4, no clicks, no rubs, no murmurs ABD:  Soft, ND EXT:  Trace to 1+ LE edema. Warm SKIN:  No rashes no nodules NEURO: Responds to stimuli and opens eyes to touch. Not localizing. PSYCH:  Unable to assess   Labs    High Sensitivity Troponin:   Recent Labs  Lab 12/30/20 1023  TROPONINIHS 88*      Chemistry Recent Labs  Lab 01/02/21 0510 01/02/21 1545 01/03/21 0638 01/04/21 0149 01/04/21 1843 01/05/21 0553  NA 129*  130*   < > 132* 130* 133* 134*  K 4.5  4.6   < > 4.3 5.6* 4.5 4.3  CL 100  100   < > 98 101 100 103  CO2 22  23   < > 25 21* 22 24  GLUCOSE 194*  194*   < > 69* 208* 176* 200*  BUN 23*  23*   < > 20 33* 34* 29*  CREATININE 1.43*  1.44*   < > 1.22* 2.01* 2.12* 1.61*  CALCIUM 7.6*  7.5*   < > 7.5* 7.5*  7.7* 7.6*  PROT 7.8  --  8.0 7.6  --   --   ALBUMIN 1.1*  1.1*   < > 1.1* 1.3* 1.3* 1.2*  AST 42*  --  38 34  --   --   ALT 21  --  21 20  --   --   ALKPHOS 408*  --  389* 374*  --   --   BILITOT 2.0*  --  1.6* 1.4*  --   --   GFRNONAA 46*  45*   < > 55* 30* 28* 39*  ANIONGAP 7  7   < > _0 < > = values in this interval not displayed.     Hematology Recent Labs  Lab 01/02/21 0510 01/03/21 3893 01/04/21 0149  WBC 23.5* 15.4* 15.1*  RBC 2.34* 2.77* 2.61*  HGB 7.3* 8.7* 8.3*  HCT 22.2* 26.3* 24.5*  MCV 94.9 94.9 93.9  MCH 31.2 31.4 31.8  MCHC 32.9 33.1 33.9  RDW 14.8 14.7 14.7  PLT PLATELET CLUMPS NOTED ON SMEAR, UNABLE TO ESTIMATE PLATELET CLUMPS NOTED ON SMEAR, COUNT APPEARS DECREASED 165    BNP No results for input(s): BNP, PROBNP in the last 168 hours.    DDimer No results for input(s): DDIMER in the last 168 hours.   Radiology    DG CHEST PORT 1 VIEW  Result Date: 01/04/2021 CLINICAL DATA:  Fever, on ventilator, hypotension, hypokalemia EXAM: PORTABLE CHEST 1 VIEW COMPARISON:  Portable exam 1012 hours compared to 01/03/2021 FINDINGS: Tip of endotracheal tube projects 8.2 cm above carina. Nasogastric tube extends into stomach. Dual lumen RIGHT jugular central venous catheter with tip projecting over SVC. Enlargement of cardiac silhouette. Mediastinal contours and pulmonary vascularity normal. Subsegmental atelectasis at LEFT base. Small RIGHT pleural effusion. No pneumothorax. IMPRESSION: LEFT basilar atelectasis and small RIGHT pleural effusion. Electronically Signed   By: Lavonia Dana M.D.   On: 01/04/2021 12:36    Cardiac Studies   Echo 01/02/21: 1. Left ventricular ejection fraction, by estimation, is 45 to 50%. The  left ventricle has mildly decreased function. The left ventricle  demonstrates global hypokinesis. Left ventricular diastolic parameters  were normal.   2. Right ventricular systolic function is normal. The right ventricular  size is normal.  There is normal  pulmonary artery systolic pressure.   3. The mitral valve is normal in structure. Mild mitral valve  regurgitation. No evidence of mitral stenosis.   4. The aortic valve is normal in structure. Aortic valve regurgitation is  not visualized. No aortic stenosis is present.   5. The inferior vena cava is normal in size with greater than 50%  respiratory variability, suggesting right atrial pressure of 3 mmHg.   Patient Profile     [name] [name] is a 60F with diabetes, hypertension, hyperlipidemia, GERD, and morbid obesity admitted with septic shock secondary to cellulitis and left buttock abscess.  Her hospitalization has been complicated by acute oliguric renal failure now on RRT and encephalopathy. Cardiology was consulted for atrial flutter with RVR and prolonged QTc.  Assessment & Plan    # Atrial flutter:  New this admission in the setting of critical illness and sepsis.  She has been on amiodarone and metoprolol, however, several doses of metop held due to hypotension. Currently off levophed. Current plan for 3 days of amiodarone 400 mg bid then 200 mg daily.  Continue to give metoprolol as BP allows. Per surgery, may not need further debridement unless clinically worsening. Will plan for TEE/DCCV once she can be on anticoagulation.  Would start heparin as soon as she is OK per surgery.   -Continue amiodarone 452m BID for 3 days total and then transition to 2054mdaily thereafter -Metop as able as blood pressure allows -Once able to tolerate uninterrupted AC, will plan for TEE/DCCV   # Acute systolic and diastolic heart failure:  LVEF 45-50% this admission.  Likely 2/2 acute illness and tachycardia but given her risk factors she will need an ischemic evaluation once medically stable.  BP too low for GDMT.   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 coli, and Prevotella. -Management per surgery, PCCM, ID  #Acute Respiratory Failure: Occurred in setting of septic shock. Remains intubated. -Vent management per PCCM  #Oliguric Renal Failure: On CRRT. -Management per renal and PCCM  For questions or updates, please contact [facility] consult [email or web address] for contact info under        Signed, HeFreada BergeronMD  01/05/2021, 8:02 AM

## 2021-01-04 NOTE — Progress Notes (Signed)
PHARMACY NOTE:  ANTIMICROBIAL RENAL DOSAGE ADJUSTMENT  Current antimicrobial regimen includes a mismatch between antimicrobial dosage and estimated renal function.  As per policy approved by the Pharmacy & Therapeutics and Medical Executive Committees, the antimicrobial dosage will be adjusted accordingly.  Current antimicrobial dosage:  Zosyn 3.375 gm IV q8 hrs (EI)  Indication: Necrotizing soft tissue infection  Renal Function: Estimated Creatinine Clearance: 37.9 mL/min (A) (by C-G formula based on SCr of 2.01 mg/dL (H)). []      On intermittent HD, scheduled: [x]      On CRRT  Antimicrobial dosage has been changed to:  Zosyn 3.375 gm IV q6 hrs (CRRT dosing)  Additional comments: Restarting CRRT today after stopping yesterday morning. Likely back to OR for further debridement on Monday, 6/13 unless worsens over the weekend.  Wednesday, PharmD, BCPS PGY2 Cardiology Pharmacy Resident Phone: [phone] 01/04/2021  10:55 AM  Please check [email or web address] for unit-specific pharmacy phone numbers.

## 2021-01-05 DIAGNOSIS — I4891 Unspecified atrial fibrillation: Secondary | ICD-10-CM

## 2021-01-05 DIAGNOSIS — G9341 Metabolic encephalopathy: Secondary | ICD-10-CM

## 2021-01-05 DIAGNOSIS — Z9289 Personal history of other medical treatment: Secondary | ICD-10-CM

## 2021-01-05 DIAGNOSIS — I5041 Acute combined systolic (congestive) and diastolic (congestive) heart failure: Secondary | ICD-10-CM

## 2021-01-05 DIAGNOSIS — E875 Hyperkalemia: Secondary | ICD-10-CM

## 2021-01-05 LAB — RENAL FUNCTION PANEL
Albumin: 1.2 g/dL — ABNORMAL LOW (ref 3.5–5.0)
Anion gap: 7 (ref 5–15)
BUN: 29 mg/dL — ABNORMAL HIGH (ref 6–20)
CO2: 24 mmol/L (ref 22–32)
Calcium: 7.6 mg/dL — ABNORMAL LOW (ref 8.9–10.3)
Chloride: 103 mmol/L (ref 98–111)
Creatinine, Ser: 1.61 mg/dL — ABNORMAL HIGH (ref 0.44–1.00)
GFR, Estimated: 39 mL/min — ABNORMAL LOW (ref >60)
Glucose, Bld: 200 mg/dL — ABNORMAL HIGH (ref 70–99)
Phosphorus: 4.2 mg/dL (ref 2.5–4.6)
Potassium: 4.3 mmol/L (ref 3.5–5.1)
Sodium: 134 mmol/L — ABNORMAL LOW (ref 135–145)

## 2021-01-05 LAB — MAGNESIUM: Magnesium: 2 mg/dL (ref 1.7–2.4)

## 2021-01-05 LAB — GLUCOSE, CAPILLARY
Glucose-Capillary: 121 mg/dL — ABNORMAL HIGH (ref 70–99)
Glucose-Capillary: 177 mg/dL — ABNORMAL HIGH (ref 70–99)
Glucose-Capillary: 184 mg/dL — ABNORMAL HIGH (ref 70–99)
Glucose-Capillary: 202 mg/dL — ABNORMAL HIGH (ref 70–99)
Glucose-Capillary: 204 mg/dL — ABNORMAL HIGH (ref 70–99)
Glucose-Capillary: 207 mg/dL — ABNORMAL HIGH (ref 70–99)
Glucose-Capillary: 216 mg/dL — ABNORMAL HIGH (ref 70–99)

## 2021-01-05 MED ORDER — DEXMEDETOMIDINE HCL IN NACL 400 MCG/100ML IV SOLN
0.0000 ug/kg/h | INTRAVENOUS | Status: DC
  Administered 2021-01-05: 0.4 ug/kg/h via INTRAVENOUS
  Administered 2021-01-05: 0.2 ug/kg/h via INTRAVENOUS
  Administered 2021-01-06: 0.5 ug/kg/h via INTRAVENOUS
  Filled 2021-01-05 (×3): qty 100

## 2021-01-05 NOTE — Progress Notes (Addendum)
NAME:  [name] [name], MRN:  [id number], DOB:  09/14/1970, LOS: 8 ADMISSION DATE:  12/28/2020, CONSULTATION DATE:  6/3 REFERRING MD:  [facility] MD, CHIEF COMPLAINT:  Fever, pain and ulceration  History of Present Illness:  50 y/o female initially admitted to [facility] on 5/29 complaining of fever and a buttock abscess, moved to [facility] on 6/3 in the setting of worsening renal failure, acidosis, encephaloapthy with known group B strep wound culture.  Pertinent  Medical History  Recent Vulvar irritation and Bartholin Cyst seen at [facility]  HTN DM type II Cholecystectomy  GERD Anxiety  Significant Hospital Events: Including procedures, antibiotic start and stop dates in addition to other pertinent events   5/29 admitted to [place] sepsis felt 2/2 cellulitis on buttocks. Cultures sent. CT abd/pelvis + Edmund gas but no drainable abscess. Started on Vanc. IVFs. Wound also cultured.  6/1 wound culture back + group B strep. 1 of 4 blood cultures + GPC. ABX changed to rocephin 6/3 Arrived at [facility] from [facility] w/ new hypotension, worsening renal failure, worsening leukocytosis, poorly controlled pain and new encephalopathy. Admitted to ICU. Started on zyvox and meropenem. Surgical consult requested> to OR for debridement 6/3 started CRRT, meropenem and Linezolid added 6/4 IVIg >6/6 6/5 Meropenem stopped 6/6 zosyn added  6/6 back to OR for debridement, in and out of atrial flutter; QTc 590; cardiology consult> amiodarone 6/7 off vasopressors, continues to have atrial flutter, started ketamine, Linezolid stopped 6/8 started on metoprolol, echo with LVEF 45-50%.  6/9 OR debridement  6/11 Fever curve improved, remains on CRRT    Interim History / Subjective:  No acute events overnight   Objective   Blood pressure 109/64, pulse (!) 115, temperature (!) 97 F (36.1 C), temperature source Axillary, resp. rate 16, height 5\' 2"  (1.575 m), weight 99.5 kg, SpO2 100 %.    Vent Mode: PRVC FiO2 (%):   [30 %] 30 % Set Rate:  [30 bmp] 30 bmp Vt Set:  [400 mL] 400 mL PEEP:  [5 cmH20] 5 cmH20 Pressure Support:  [5 cmH20] 5 cmH20 Plateau Pressure:  [4 cmH20-19 cmH20] 4 cmH20   Intake/Output Summary (Last 24 hours) at 01/05/2021 0847 Last data filed at 01/05/2021 0700 Gross per 24 hour  Intake 2153.97 ml  Output 2189 ml  Net -35.03 ml    Filed Weights   01/03/21 0408 01/04/21 1345 01/05/21 0500  Weight: 98.4 kg 98.7 kg 99.5 kg    Examination: General: Acute on chronically ill appearing adult female lying in bed on mechanical ventilation, in NAD HEENT: ETT, MM pink/moist, PERRL,  Neuro: Sedated on vent, unable to follow commands, disconjugate gaze  CV: Tachycardia, s1s2 regular rate and rhythm, no murmur, rubs, or gallops,  PULM:  Clear to ascultation bilaterally, no added breath sounds, tolerating vent  GI: soft, bowel sounds active in all 4 quadrants, non-tender, non-distended, tolerating TF Extremities: warm/dry, non-pitting  edema  Skin: no rashes or lesions  Labs/imaging that I havepersonally reviewed   6/10 CXR LEFT basilar atelectasis and small RIGHT pleural effusion.  Resolved Hospital Problem list     Assessment & Plan:   Septic shock due to polymicrobial necrotizing buttock infection,  -s/p critical illness exploration and debridement x3 (6/3, 6/6, 6/9) P: ID and general surgery following  Continue IV Zosyn  S/P 3 days of IVIG and course of linezolid for antitoxin effect Pressors stopped 6/11 Trend CBC and fever curve  Follow cultures   Oliguric AKI -Secondary to presumed ischemic ATN in setting  of severe sepsis resulting in hypotension multiple pressors Hyperkalemia - improved P: Nephrology following, appreciate assistance CRRT per nephrology Follow renal function Monitor urine output Trend Bmet Avoid nephrotoxins Ensure adequate renal perfusion  Close monitoring of electrolytes  New onset atrial flutter -New onset in the setting of critical illness  and sepsis Acute systolic and diastolic heart failure -Echo with EF of 45 to 50%, likely secondary to tachycardia in the setting of critical illness, will need ischemic work-up once stabilized P: Cardiology following, appreciate assistance Continuous telemetry Strict intake and output Continue amiodarone Optimize electrolytes Beta-blocker therapy/GDMT as hemodynamics allow Cardiology to plan TEE/DCCV once anticoagulation initiated  Acute respiratory failure with hypoxemia -In the setting of severe septic shock P: Continue ventilator support with lung protective strategies  Wean PEEP and FiO2 for sats greater than 90%. Head of bed elevated 30 degrees. Plateau pressures less than 30 cm H20.  Follow intermittent chest x-ray and ABG.   SAT/SBT as tolerated, mentation preclude extubation  Ensure adequate pulmonary hygiene  Follow cultures  VAP bundle in place  PAD protocol Minimize sedation as able Continue nocturnal Seroquel  Acute metabolic encephalopathy Baseline narcotic dependence, high pain medication needs this admission P: Maintain neuro protective measures  Delirium precautions Nutrition and bowel regiment  Seizure precautions  Aspirations precautions  Wean fentanyl today with use of Precedex If mentation does not improve need to consider Head CT  Mild normocytic anemia without bleeding P: Trend CBC Transfuse per protocol Hemoglobin goal greater than 7  DM2 with hyperglycemia P: Continue SSI CBG goal 1 40-1 80 CBG every 4 hours Continue to be coverage Continue long  Best practice   Diet:  Tube Feed  Pain/Anxiety/Delirium protocol (if indicated): Yes (RASS goal -2) VAP protocol (if indicated): Yes DVT prophylaxis: Subcutaneous Heparin GI prophylaxis: H2B Glucose control:  SSI Yes Central venous access:  Yes, and it is still needed Arterial line:  Yes, and it is still needed Foley:  Yes, and it is still needed Mobility:  bed rest  PT consulted:  Yes Last date of multidisciplinary goals of care discussion update patient's family daily Code Status:  full code Disposition: remain in ICU   CRITICAL CARE Performed by: Delfin Gant   Total critical care time: 38 minutes  Critical care time was exclusive of separately billable procedures and treating other patients.  Critical care was necessary to treat or prevent imminent or life-threatening deterioration.  Critical care was time spent personally by me on the following activities: development of treatment plan with patient and/or surrogate as well as nursing, discussions with consultants, evaluation of patient's response to treatment, examination of patient, obtaining history from patient or surrogate, ordering and performing treatments and interventions, ordering and review of laboratory studies, ordering and review of radiographic studies, pulse oximetry and re-evaluation of patient's condition.  Delfin Gant, NP-C [facility] Personal contact information can be found on Amion  01/05/2021, 9:14 AM    Pulmonary critical care attending:  This is a 50 year old female admitted with fever and a buttock abscess developed septic shock multiorgan failure renal failure due to group B strep.  Patient was taken to the operating room 3 separate times for debridement.  Patient now critically ill on vasopressors intubated on mechanical life support with CVVHD in the intensive care unit.  BP 99/77   Pulse (!) 120   Temp (!) 97 F (36.1 C) (Axillary)   Resp 16   Ht 5\' 2"  (1.575 m)  Wt 99.5 kg   SpO2 100%   BMI 40.12 kg/m   General: Female intubated on mechanical life support critically ill HEENT: Endotracheal tube in place Heart: Regular rhythm S1-S2 Lungs: Bilateral mechanically ventilated breath sounds Abdomen: Soft nontender nondistended Buttock wound per surgery service note.  Labs: Reviewed  Assessment: Septic shock, polymicrobial necrotizing  soft tissue skin infection status postdebridement by surgical services Acute renal failure requiring CVVHD Hyperkalemia resolved secondary to CVVHD New onset atrial flutter Acute systolic and diastolic heart failure Acute hypoxemic respiratory failure secondary to above requiring intubation and medical ventilation Acute metabolic encephalopathy secondary to above  Plan: Continue IV Zosyn status post IVIG and a course of linezolid for antitoxin Wean vasopressors as tolerated Mental status seems to wax and wane.  She does respond to pain this morning. Sedation changed PAD guidelines with Fentanyl plus Precedex She does not continue to come around from a neurologic standpoint may need to consider CT imaging of the head to rule out intracranial process or considerations for EEG. Continue to wean from ventilator as tolerated VAP protocol Head of bed elevated SAT SBT once mental status improves  This patient is critically ill with multiple organ system failure; which, requires frequent high complexity decision making, assessment, support, evaluation, and titration of therapies. This was completed through the application of advanced monitoring technologies and extensive interpretation of multiple databases. During this encounter critical care time was devoted to patient care services described in this note for 40 minutes.  Josephine Igo, DO [facility] 01/05/2021 12:21 PM

## 2021-01-05 NOTE — Progress Notes (Signed)
Subjective: On the ventilator with some sedation   Antibiotics:  Anti-infectives (From admission, onward)    Start     Dose/Rate Route Frequency Ordered Stop   01/04/21 1800  piperacillin-tazobactam (ZOSYN) IVPB 3.375 g        3.375 g 100 mL/hr over 30 Minutes Intravenous Every 6 hours 01/04/21 1059     01/03/21 1400  piperacillin-tazobactam (ZOSYN) IVPB 3.375 g  Status:  Discontinued        3.375 g 12.5 mL/hr over 240 Minutes Intravenous Every 8 hours 01/03/21 1124 01/04/21 1059   01/01/21 1800  piperacillin-tazobactam (ZOSYN) IVPB 3.375 g  Status:  Discontinued        3.375 g 12.5 mL/hr over 240 Minutes Intravenous Every 6 hours 01/01/21 1059 01/03/21 1124   12/31/20 1400  piperacillin-tazobactam (ZOSYN) IVPB 3.375 g  Status:  Discontinued        3.375 g 12.5 mL/hr over 240 Minutes Intravenous Every 6 hours 12/31/20 0918 01/01/21 1059   12/29/20 1400  clindamycin (CLEOCIN) IVPB 900 mg  Status:  Discontinued        900 mg 100 mL/hr over 30 Minutes Intravenous Every 8 hours 12/29/20 1214 12/29/20 1234   12/28/20 2200  linezolid (ZYVOX) IVPB 600 mg  Status:  Discontinued        600 mg 300 mL/hr over 60 Minutes Intravenous Every 12 hours 12/28/20 1620 01/02/21 0920   12/28/20 1830  meropenem (MERREM) 1 g in sodium chloride 0.9 % 100 mL IVPB  Status:  Discontinued        1 g 200 mL/hr over 30 Minutes Intravenous Every 8 hours 12/28/20 1731 12/31/20 0918   12/28/20 1800  meropenem (MERREM) 1 g in sodium chloride 0.9 % 100 mL IVPB  Status:  Discontinued        1 g 200 mL/hr over 30 Minutes Intravenous Every 24 hours 12/28/20 1700 12/28/20 1731   12/28/20 1730  meropenem (MERREM) 1 g in sodium chloride 0.9 % 100 mL IVPB  Status:  Discontinued        1 g 200 mL/hr over 30 Minutes Intravenous Every 8 hours 12/28/20 1639 12/28/20 1700       Medications: Scheduled Meds:  [START ON 01/07/2021] amiodarone  200 mg Per Tube Q1200   amiodarone  400 mg Per Tube BID   B-complex  with vitamin C  1 tablet Per Tube Daily   chlorhexidine gluconate (MEDLINE KIT)  15 mL Mouth Rinse BID   Chlorhexidine Gluconate Cloth  6 each Topical Q0600   clonazepam  1 mg Per Tube TID   docusate  100 mg Per Tube BID   feeding supplement (PROSource TF)  45 mL Per Tube QID   heparin  5,000 Units Subcutaneous Q8H   insulin aspart  0-20 Units Subcutaneous Q4H   insulin aspart  3 Units Subcutaneous Q4H   insulin detemir  14 Units Subcutaneous BID   mouth rinse  15 mL Mouth Rinse 10 times per day   oxyCODONE  10 mg Per Tube Q4H   pantoprazole sodium  40 mg Per Tube Daily   polyethylene glycol  17 g Per Tube Daily   QUEtiapine  25 mg Per Tube QHS   sodium chloride flush  10-40 mL Intracatheter Q12H   Continuous Infusions:  sodium chloride 250 mL (01/05/21 1409)   dexmedetomidine (PRECEDEX) IV infusion 0.4 mcg/kg/hr (01/05/21 1400)   feeding supplement (VITAL 1.5 CAL) 1,000 mL (01/04/21 1828)   fentaNYL  infusion INTRAVENOUS Stopped (01/05/21 1209)   norepinephrine (LEVOPHED) Adult infusion 10 mcg/min (01/04/21 0008)   piperacillin-tazobactam Stopped (01/05/21 1134)   prismasol BGK 2/2.5 replacement solution 500 mL/hr at 01/05/21 1057   prismasol BGK 2/2.5 replacement solution 300 mL/hr at 01/05/21 0035   prismasol BGK 4/2.5 1,500 mL/hr at 01/05/21 1357   PRN Meds:.acetaminophen, fentaNYL, heparin, heparin, levalbuterol, midazolam, ondansetron (ZOFRAN) IV, polyethylene glycol, sodium chloride flush    Objective: Weight change:   Intake/Output Summary (Last 24 hours) at 01/05/2021 1428 Last data filed at 01/05/2021 1400 Gross per 24 hour  Intake 2246.88 ml  Output 3139 ml  Net -892.12 ml   Blood pressure (!) 81/54, pulse 97, temperature 97.9 F (36.6 C), temperature source Axillary, resp. rate (!) 27, height 5' 2"  (1.575 m), weight 99.5 kg, SpO2 100 %. Temp:  [97 F (36.1 C)-98.4 F (36.9 C)] 97.9 F (36.6 C) (06/11 1132) Pulse Rate:  [97-120] 97 (06/11 1215) Resp:  [12-35]  27 (06/11 1215) BP: (76-151)/(45-94) 81/54 (06/11 1215) SpO2:  [94 %-100 %] 100 % (06/11 1215) FiO2 (%):  [30 %] 30 % (06/11 1132) Weight:  [99.5 kg] 99.5 kg (06/11 0500)  Physical Exam: Physical Exam Constitutional:      Appearance: She is ill-appearing.     Interventions: She is intubated.  HENT:     Head: Normocephalic and atraumatic.  Eyes:     Extraocular Movements: Extraocular movements intact.  Cardiovascular:     Heart sounds: No murmur heard.   No gallop.  Pulmonary:     Effort: She is intubated.     Breath sounds: No wheezing or rhonchi.  Skin:    Coloration: Skin is pale. Skin is not jaundiced.     Findings: No bruising.  Neurological:     General: No focal deficit present.    Central lines appear clean.  Wound not examined by me but examined by surgery earlier today.  CBC:    BMET Recent Labs    01/04/21 1843 01/05/21 0553  NA 133* 134*  K 4.5 4.3  CL 100 103  CO2 22 24  GLUCOSE 176* 200*  BUN 34* 29*  CREATININE 2.12* 1.61*  CALCIUM 7.7* 7.6*     Liver Panel  Recent Labs    01/03/21 0638 01/04/21 0149 01/04/21 1843 01/05/21 0553  PROT 8.0 7.6  --   --   ALBUMIN 1.1* 1.3* 1.3* 1.2*  AST 38 34  --   --   ALT 21 20  --   --   ALKPHOS 389* 374*  --   --   BILITOT 1.6* 1.4*  --   --        Sedimentation Rate No results for input(s): ESRSEDRATE in the last 72 hours. C-Reactive Protein No results for input(s): CRP in the last 72 hours.  Micro Results: Recent Results (from the past 720 hour(s))  MRSA PCR Screening     Status: None   Collection Time: 12/28/20  3:20 PM   Specimen: Nasopharyngeal  Result Value Ref Range Status   MRSA by PCR NEGATIVE NEGATIVE Final    Comment:        The GeneXpert MRSA Assay (FDA approved for NASAL specimens only), is one component of a comprehensive MRSA colonization surveillance program. It is not intended to diagnose MRSA infection nor to guide or monitor treatment for MRSA  infections. Performed at [facility], [place] [address] [place], [place] [address]   Culture, blood (routine x 2)  Status: None   Collection Time: 12/28/20  4:43 PM   Specimen: BLOOD  Result Value Ref Range Status   Specimen Description BLOOD LEFT ANTECUBITAL  Final   Special Requests   Final    BOTTLES DRAWN AEROBIC AND ANAEROBIC Blood Culture adequate volume   Culture   Final    NO GROWTH 5 DAYS Performed at [facility], [address] [place], [place] [address]    Report Status 01/02/2021 FINAL  Final  Culture, blood (Routine X 2) w Reflex to ID Panel     Status: None   Collection Time: 12/28/20  4:45 PM   Specimen: BLOOD LEFT ARM  Result Value Ref Range Status   Specimen Description BLOOD LEFT ARM  Final   Special Requests   Final    BOTTLES DRAWN AEROBIC ONLY Blood Culture adequate volume   Culture   Final    NO GROWTH 5 DAYS Performed at [facility], [place] [address], [place], [place] [address]    Report Status 01/02/2021 FINAL  Final  Aerobic/Anaerobic Culture w Gram Stain (surgical/deep wound)     Status: None   Collection Time: 12/28/20  9:22 PM   Specimen: Abscess  Result Value Ref Range Status   Specimen Description ABSCESS LEFT BUTTOCKS  Final   Special Requests SWABS  Final   Gram Stain   Final    MODERATE WBC PRESENT,BOTH PMN AND MONONUCLEAR ABUNDANT GRAM NEGATIVE RODS RARE GRAM POSITIVE COCCI IN PAIRS    Culture   Final    RARE STREPTOCOCCUS AGALACTIAE TESTING AGAINST S. AGALACTIAE NOT ROUTINELY PERFORMED DUE TO PREDICTABILITY OF AMP/PEN/VAN SUSCEPTIBILITY. MODERATE PREVOTELLA BIVIA BETA LACTAMASE POSITIVE Performed at [facility], [place] [address], [place], [place] [address]    Report Status 01/01/2021 FINAL  Final  Aerobic/Anaerobic Culture w Gram Stain (surgical/deep wound)     Status: None   Collection Time: 12/28/20  9:28 PM   Specimen: Abscess  Result Value Ref Range Status   Specimen Description TISSUE LEFT BUTTOCKS   Final   Special Requests NONE  Final   Gram Stain NO WBC SEEN NO ORGANISMS SEEN   Final   Culture   Final    RARE ESCHERICHIA COLI NO ANAEROBES ISOLATED Performed at [facility], [place] [address], [place], [place] [address]    Report Status 01/03/2021 FINAL  Final   Organism ID, Bacteria ESCHERICHIA COLI  Final      Susceptibility   Escherichia coli - MIC*    AMPICILLIN >=32 RESISTANT Resistant     CEFAZOLIN 8 SENSITIVE Sensitive     CEFEPIME <=0.12 SENSITIVE Sensitive     CEFTAZIDIME <=1 SENSITIVE Sensitive     CEFTRIAXONE <=0.25 SENSITIVE Sensitive     CIPROFLOXACIN <=0.25 SENSITIVE Sensitive     GENTAMICIN <=1 SENSITIVE Sensitive     IMIPENEM <=0.25 SENSITIVE Sensitive     TRIMETH/SULFA >=320 RESISTANT Resistant     AMPICILLIN/SULBACTAM >=32 RESISTANT Resistant     PIP/TAZO <=4 SENSITIVE Sensitive     * RARE ESCHERICHIA COLI  Culture, blood (routine x 2)     Status: None   Collection Time: 12/28/20 11:10 PM   Specimen: BLOOD  Result Value Ref Range Status   Specimen Description BLOOD RIGHT ARM  Final   Special Requests   Final    BOTTLES DRAWN AEROBIC AND ANAEROBIC Blood Culture adequate volume   Culture   Final    NO GROWTH 5 DAYS Performed at [facility], [address], [place], [place] [address]    Report Status 01/03/2021 FINAL  Final  Culture, blood (Routine X 2) w Reflex to ID Panel     Status: None   Collection Time: 12/28/20 11:26 PM   Specimen: BLOOD  Result Value Ref Range Status   Specimen Description BLOOD LEFT ARM  Final   Special Requests   Final    BOTTLES DRAWN AEROBIC AND ANAEROBIC Blood Culture adequate volume   Culture   Final    NO GROWTH 5 DAYS Performed at [facility], [place] [address], [place], [place] [address]    Report Status 01/03/2021 FINAL  Final  Culture, blood (Routine X 2) w Reflex to ID Panel     Status: None (Preliminary result)   Collection Time: 01/04/21 10:18 AM   Specimen: BLOOD RIGHT HAND  Result Value  Ref Range Status   Specimen Description BLOOD RIGHT HAND  Final   Special Requests   Final    BOTTLES DRAWN AEROBIC ONLY Blood Culture adequate volume   Culture   Final    NO GROWTH < 24 HOURS Performed at [facility], [place] [address], [place], [place] [address]    Report Status PENDING  Incomplete  Culture, blood (Routine X 2) w Reflex to ID Panel     Status: None (Preliminary result)   Collection Time: 01/04/21 10:25 AM   Specimen: BLOOD LEFT HAND  Result Value Ref Range Status   Specimen Description BLOOD LEFT HAND  Final   Special Requests   Final    BOTTLES DRAWN AEROBIC ONLY Blood Culture adequate volume   Culture   Final    NO GROWTH < 24 HOURS Performed at [facility], [place] [address] [place], [place] [address]    Report Status PENDING  Incomplete    Studies/Results: DG CHEST PORT 1 VIEW  Result Date: 01/04/2021 CLINICAL DATA:  Fever, on ventilator, hypotension, hypokalemia EXAM: PORTABLE CHEST 1 VIEW COMPARISON:  Portable exam 1012 hours compared to 01/03/2021 FINDINGS: Tip of endotracheal tube projects 8.2 cm above carina. Nasogastric tube extends into stomach. Dual lumen RIGHT jugular central venous catheter with tip projecting over SVC. Enlargement of cardiac silhouette. Mediastinal contours and pulmonary vascularity normal. Subsegmental atelectasis at LEFT base. Small RIGHT pleural effusion. No pneumothorax. IMPRESSION: LEFT basilar atelectasis and small RIGHT pleural effusion. Electronically Signed   By: Lavonia Dana M.D.   On: 01/04/2021 12:36      Assessment/Plan:  INTERVAL HISTORY: Patient is following more commands today   Principal Problem:   Necrotizing soft tissue infection Active Problems:   Septic shock ([place])   Sepsis due to Streptococcus, group B (HCC)   Acute kidney injury ([place])   Obesity   Type 2 diabetes mellitus with hyperglycemia (HCC)   Acute hypercapnic respiratory failure (HCC)   Typical atrial flutter (HCC)   Hyperkalemia    New onset a-fib ([place])   Acute combined systolic and diastolic heart failure ([place])   Acute metabolic encephalopathy    Dilara Navarrete is a 50 y.o. female with polymicrobial necrotizing infection with sepsis she has now status post multiple debridements with cultures having yielded group B streptococci E. coli and Prevotella.  She remains on Zosyn.  Surgery of examined her wounds and they are happy with the progress and feel that she is doing well and did not require further debridement at this point in time.  Her pressor requirements seem to gone down slightly.  Chest x-ray  done yesterday did show a pleural effusion  I would continue her current antibiotics with Zosyn  If she were to worsen clinically and her soft tissue infection was not the obvious source I would consider it therapeutic and diagnostic thoracentesis  We will continue to follow.  I spent more than 35 minutes with the patient including greater than 50% of time in face to face counseling of the patient personally reviewing radiographs, along with pertinent laboratory microbiological data review of medical records and in coordination of her care.     LOS: 8 days   Alcide Evener 01/05/2021, 2:28 PM

## 2021-01-05 NOTE — Progress Notes (Signed)
Patient ID: [name] [name], female   DOB: 1971/06/17, 50 y.o.   MRN: [id number] S:No events overnight.  Pt seen and examined while on CRRT and labs and orders reviewed and necessary changes made.  O:BP 109/64   Pulse (!) 115   Temp (!) 97 F (36.1 C) (Axillary)   Resp 16   Ht _0  (1.575 m)   Wt 99.5 kg   SpO2 100%   BMI 40.12 kg/m   Intake/Output Summary (Last 24 hours) at 01/05/2021 0927 Last data filed at 01/05/2021 0700 Gross per 24 hour  Intake 2039.43 ml  Output 2189 ml  Net -149.57 ml   Intake/Output: I/O last 3 completed shifts: In: 3414.4 [I.V.:1567.7; NG/GT:1576; IV Piggyback:270.7] Out: 2599 [Urine:1340; Other:1259]  Intake/Output this shift:  No intake/output data recorded. Weight change:  DPO:EUMPNTIRW and sedated ERX:VQMGQ Resp:mechanically ventilated BS bilaterally QPY:PPJKD, soft, +BS Ext:no edema  Recent Labs  Lab 12/30/20 0420 12/30/20 1601 12/31/20 0330 12/31/20 0753 01/01/21 1530 01/02/21 0510 01/02/21 1545 01/03/21 3267 01/04/21 0149 01/04/21 1843 01/05/21 0553  NA 129*   < > 131*   < > 128* 129*  130* 130* 132* 130* 133* 134*  K 3.5   < > 3.6   < > 4.2 4.5  4.6 4.5 4.3 5.6* 4.5 4.3  CL 93*   < > 97*   < > 99 100  100 100 98 101 100 103  CO2 28   < > 26   < > _1 21* 22 24  GLUCOSE 237*   < > 268*   < > 163* 194*  194* 168* 69* 208* 176* 200*  BUN 27*   < > 27*   < > 23* 23*  23* 22* 20 33* 34* 29*  CREATININE 2.58*   < > 1.92*   < > 1.46* 1.43*  1.44* 1.36* 1.22* 2.01* 2.12* 1.61*  ALBUMIN 1.1*   < > 1.1*   < > 1.0* 1.1*  1.1* 1.0* 1.1* 1.3* 1.3* 1.2*  CALCIUM 7.0*   < > 7.3*   < > 7.2* 7.6*  7.5* 7.2* 7.5* 7.5* 7.7* 7.6*  PHOS 2.4*   < >  --    < > 3.3 3.2 3.3 2.8 4.9* 5.1* 4.2  AST 29  --  41  --   --  42*  --  38 34  --   --   ALT 14  --  19  --   --  21  --  21 20  --   --    < > = values in this interval not displayed.   Liver Function Tests: Recent Labs  Lab 01/02/21 0510 01/02/21 1545 01/03/21 0638  01/04/21 0149 01/04/21 1843 01/05/21 0553  AST 42*  --  38 34  --   --   ALT 21  --  21 20  --   --   ALKPHOS 408*  --  389* 374*  --   --   BILITOT 2.0*  --  1.6* 1.4*  --   --   PROT 7.8  --  8.0 7.6  --   --   ALBUMIN 1.1*  1.1*   < > 1.1* 1.3* 1.3* 1.2*   < > = values in this interval not displayed.   No results for input(s): LIPASE, AMYLASE in the last 168 hours. No results for input(s): AMMONIA in the last 168 hours. CBC: Recent Labs  Lab 12/30/20 0420 12/31/20 0330 12/31/20 0753  01/02/21 0510 01/03/21 6160 01/04/21 0149  WBC 44.0* 35.1*  --  23.5* 15.4* 15.1*  NEUTROABS 38.4* 29.9*  --  19.1* 11.5*  --   HGB 9.8* 9.8*   < > 7.3* 8.7* 8.3*  HCT 27.2* 27.7*   < > 22.2* 26.3* 24.5*  MCV 87.5 90.2  --  94.9 94.9 93.9  PLT 149* 144*  --  PLATELET CLUMPS NOTED ON SMEAR, UNABLE TO ESTIMATE PLATELET CLUMPS NOTED ON SMEAR, COUNT APPEARS DECREASED 165   < > = values in this interval not displayed.   Cardiac Enzymes: No results for input(s): CKTOTAL, CKMB, CKMBINDEX, TROPONINI in the last 168 hours. CBG: Recent Labs  Lab 01/04/21 1652 01/04/21 1917 01/04/21 2310 01/05/21 0407 01/05/21 0733  GLUCAP 183* 163* 121* 184* 216*    Iron Studies: No results for input(s): IRON, TIBC, TRANSFERRIN, FERRITIN in the last 72 hours. Studies/Results: DG CHEST PORT 1 VIEW  Result Date: 01/04/2021 CLINICAL DATA:  Fever, on ventilator, hypotension, hypokalemia EXAM: PORTABLE CHEST 1 VIEW COMPARISON:  Portable exam 1012 hours compared to 01/03/2021 FINDINGS: Tip of endotracheal tube projects 8.2 cm above carina. Nasogastric tube extends into stomach. Dual lumen RIGHT jugular central venous catheter with tip projecting over SVC. Enlargement of cardiac silhouette. Mediastinal contours and pulmonary vascularity normal. Subsegmental atelectasis at LEFT base. Small RIGHT pleural effusion. No pneumothorax. IMPRESSION: LEFT basilar atelectasis and small RIGHT pleural effusion. Electronically Signed    By: Lavonia Dana M.D.   On: 01/04/2021 12:36    [START ON 01/07/2021] amiodarone  200 mg Per Tube Q1200   amiodarone  400 mg Per Tube BID   B-complex with vitamin C  1 tablet Per Tube Daily   chlorhexidine gluconate (MEDLINE KIT)  15 mL Mouth Rinse BID   Chlorhexidine Gluconate Cloth  6 each Topical Q0600   clonazepam  1 mg Per Tube TID   docusate  100 mg Per Tube BID   feeding supplement (PROSource TF)  45 mL Per Tube QID   heparin  5,000 Units Subcutaneous Q8H   insulin aspart  0-20 Units Subcutaneous Q4H   insulin aspart  3 Units Subcutaneous Q4H   insulin detemir  14 Units Subcutaneous BID   mouth rinse  15 mL Mouth Rinse 10 times per day   oxyCODONE  10 mg Per Tube Q4H   pantoprazole sodium  40 mg Per Tube Daily   polyethylene glycol  17 g Per Tube Daily   QUEtiapine  25 mg Per Tube QHS   sodium chloride flush  10-40 mL Intracatheter Q12H   sodium hypochlorite   Irrigation Once    BMET    Component Value Date/Time   NA 134 (L) 01/05/2021 0553   K 4.3 01/05/2021 0553   CL 103 01/05/2021 0553   CO2 24 01/05/2021 0553   GLUCOSE 200 (H) 01/05/2021 0553   BUN 29 (H) 01/05/2021 0553   CREATININE 1.61 (H) 01/05/2021 0553   CALCIUM 7.6 (L) 01/05/2021 0553   GFRNONAA 39 (L) 01/05/2021 0553   CBC    Component Value Date/Time   WBC 15.1 (H) 01/04/2021 0149   RBC 2.61 (L) 01/04/2021 0149   HGB 8.3 (L) 01/04/2021 0149   HCT 24.5 (L) 01/04/2021 0149   PLT 165 01/04/2021 0149   MCV 93.9 01/04/2021 0149   MCH 31.8 01/04/2021 0149   MCHC 33.9 01/04/2021 0149   RDW 14.7 01/04/2021 0149   LYMPHSABS 2.2 01/03/2021 0638   MONOABS 1.3 (H) 01/03/2021 0638   EOSABS 0.1  01/03/2021 0638   BASOSABS 0.1 01/03/2021 1282    Assessment/Plan:   AKI - presumably ischemic ATN in setting of severe sepsis syndrome and hypotension requiring multiple pressors.  She was started on CRRT 12/28/20 vir RIJ HD catheter placed by PCCM.  Finally starting to make urine. Taken off of CVVHD 01/03/21 with  plan to transition for IHD, however now with fevers and hypotension requiring pressors again. Resumed CRRT 01/04/21. Starting to make some urine and no longer oliguric CRRT orders:  dialysate 4K/2.5Ca at 1500 ml/hr, pre-filter 2K/2.5Ca 54m/hr, post-filter 300 ml/hr, Keep even, no heparin Hopefully we can stop CRRT in 24 hours if UOP continues to improve.  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 HD catheter is only 842days old and unlikely source but ID following and await recommendations.  May need to remove/replace. Hyperkalemia - improved with resumption of CRRT as above. VDRF - per PCCM Atrial flutter with RVR:  Cardiology following.  On amiodarone and off levophed. DM type 2 - per primary Acute metabolic encephalopathy Anemia of critical illness - transfuse prn. Hypophosphatemia - repleted IV and follow.  JDonetta Potts MD [employer]([phone]

## 2021-01-05 NOTE — Progress Notes (Signed)
2 Days Post-Op   Subjective/Chief Complaint: Pt with no acute issues On HD Tf ongoing Off pressors   Objective: Vital signs in last 24 hours: Temp:  [97 F (36.1 C)-100.3 F (37.9 C)] 97.7 F (36.5 C) (06/11 0600) Pulse Rate:  [101-119] 115 (06/11 0700) Resp:  [12-35] 16 (06/11 0700) BP: (80-151)/(50-94) 109/64 (06/11 0700) SpO2:  [93 %-100 %] 100 % (06/11 0700) FiO2 (%):  [30 %] 30 % (06/11 0600) Weight:  [98.7 kg-99.5 kg] 99.5 kg (06/11 0500) Last BM Date: 01/05/21  Intake/Output from previous day: 06/10 0701 - 06/11 0700 In: 2348.1 [I.V.:583.6; IZ/TI:4580; IV Piggyback:188.5] Out: 2274 [Urine:1015] Intake/Output this shift: No intake/output data recorded.  General appearance: sedate on veny Incision/Wound: base clean, no signs of necrosis at this time  Lab Results:  Recent Labs    01/03/21 0638 01/04/21 0149  WBC 15.4* 15.1*  HGB 8.7* 8.3*  HCT 26.3* 24.5*  PLT PLATELET CLUMPS NOTED ON SMEAR, COUNT APPEARS DECREASED 165   BMET Recent Labs    01/04/21 1843 01/05/21 0553  NA 133* 134*  K 4.5 4.3  CL 100 103  CO2 22 24  GLUCOSE 176* 200*  BUN 34* 29*  CREATININE 2.12* 1.61*  CALCIUM 7.7* 7.6*   PT/INR No results for input(s): LABPROT, INR in the last 72 hours. ABG No results for input(s): PHART, HCO3 in the last 72 hours.  Invalid input(s): PCO2, PO2  Studies/Results: DG CHEST PORT 1 VIEW  Result Date: 01/04/2021 CLINICAL DATA:  Fever, on ventilator, hypotension, hypokalemia EXAM: PORTABLE CHEST 1 VIEW COMPARISON:  Portable exam 1012 hours compared to 01/03/2021 FINDINGS: Tip of endotracheal tube projects 8.2 cm above carina. Nasogastric tube extends into stomach. Dual lumen RIGHT jugular central venous catheter with tip projecting over SVC. Enlargement of cardiac silhouette. Mediastinal contours and pulmonary vascularity normal. Subsegmental atelectasis at LEFT base. Small RIGHT pleural effusion. No pneumothorax. IMPRESSION: LEFT basilar atelectasis  and small RIGHT pleural effusion. Electronically Signed   By: Ulyses Southward M.D.   On: 01/04/2021 12:36    Anti-infectives: Anti-infectives (From admission, onward)    Start     Dose/Rate Route Frequency Ordered Stop   01/04/21 1800  piperacillin-tazobactam (ZOSYN) IVPB 3.375 g        3.375 g 100 mL/hr over 30 Minutes Intravenous Every 6 hours 01/04/21 1059     01/03/21 1400  piperacillin-tazobactam (ZOSYN) IVPB 3.375 g  Status:  Discontinued        3.375 g 12.5 mL/hr over 240 Minutes Intravenous Every 8 hours 01/03/21 1124 01/04/21 1059   01/01/21 1800  piperacillin-tazobactam (ZOSYN) IVPB 3.375 g  Status:  Discontinued        3.375 g 12.5 mL/hr over 240 Minutes Intravenous Every 6 hours 01/01/21 1059 01/03/21 1124   12/31/20 1400  piperacillin-tazobactam (ZOSYN) IVPB 3.375 g  Status:  Discontinued        3.375 g 12.5 mL/hr over 240 Minutes Intravenous Every 6 hours 12/31/20 0918 01/01/21 1059   12/29/20 1400  clindamycin (CLEOCIN) IVPB 900 mg  Status:  Discontinued        900 mg 100 mL/hr over 30 Minutes Intravenous Every 8 hours 12/29/20 1214 12/29/20 1234   12/28/20 2200  linezolid (ZYVOX) IVPB 600 mg  Status:  Discontinued        600 mg 300 mL/hr over 60 Minutes Intravenous Every 12 hours 12/28/20 1620 01/02/21 0920   12/28/20 1830  meropenem (MERREM) 1 g in sodium chloride 0.9 % 100 mL IVPB  Status:  Discontinued        1 g 200 mL/hr over 30 Minutes Intravenous Every 8 hours 12/28/20 1731 12/31/20 0918   12/28/20 1800  meropenem (MERREM) 1 g in sodium chloride 0.9 % 100 mL IVPB  Status:  Discontinued        1 g 200 mL/hr over 30 Minutes Intravenous Every 24 hours 12/28/20 1700 12/28/20 1731   12/28/20 1730  meropenem (MERREM) 1 g in sodium chloride 0.9 % 100 mL IVPB  Status:  Discontinued        1 g 200 mL/hr over 30 Minutes Intravenous Every 8 hours 12/28/20 1639 12/28/20 1700       Assessment/Plan: Necrotizing soft tissue infection left buttock POD#8 S/P DEBRIDEMENT LEFT  BUTTOCK NECROTIZING SOFT TISSUE INFECTION - TOTAL TISSUE VOLUME 14CM X 12CM X 3CM DEEP 12/28/2020  Dr. Violeta Gelinas POD#5 S/P INCISION AND DRAINAGE ABSCESS BUTTOCK (Left), 16x15x3cm wound; 6/6 Dr. Derrell Lolling POD#2 S/P IRRIGATION AND DEBRIDEMENT LEFT BUTTOCK 6/9 Dr. Derrell Lolling  - WBC stable   - Intraoperative cultures 6/3 with GBS, E. coli, and Prevotella - continue moist-to-dry dressing changes.  - continue TF - continue IV abx per ID - wound appears to be healing well.  May not need any further debridement in OR.  Coverage of wound is the next challenge. Currently has improving hemodynamics. Worsening sepsis or wound concerns would warrant take back to OR over the weekend.   Below per CCM: VDRF Septic shock Renal failure DM2 Obesity   FEN: NPO, IVF, TF ID:  merrem 6/3-6/6, linezolid 6/3-6/8, Zosyn 6/6 >> abx per ID VTE: SCD's, SQH Foley: in place for strict I&O's, placed 6/3 Dispo: ICU  LOS: 8 days    Axel Filler 01/05/2021

## 2021-01-06 DIAGNOSIS — Z4659 Encounter for fitting and adjustment of other gastrointestinal appliance and device: Secondary | ICD-10-CM

## 2021-01-06 DIAGNOSIS — I5041 Acute combined systolic (congestive) and diastolic (congestive) heart failure: Secondary | ICD-10-CM

## 2021-01-06 DIAGNOSIS — G9341 Metabolic encephalopathy: Secondary | ICD-10-CM

## 2021-01-06 DIAGNOSIS — Z452 Encounter for adjustment and management of vascular access device: Secondary | ICD-10-CM

## 2021-01-06 LAB — GLUCOSE, CAPILLARY
Glucose-Capillary: 119 mg/dL — ABNORMAL HIGH (ref 70–99)
Glucose-Capillary: 132 mg/dL — ABNORMAL HIGH (ref 70–99)
Glucose-Capillary: 158 mg/dL — ABNORMAL HIGH (ref 70–99)
Glucose-Capillary: 164 mg/dL — ABNORMAL HIGH (ref 70–99)
Glucose-Capillary: 166 mg/dL — ABNORMAL HIGH (ref 70–99)
Glucose-Capillary: 183 mg/dL — ABNORMAL HIGH (ref 70–99)

## 2021-01-06 LAB — RENAL FUNCTION PANEL
Albumin: 1.2 g/dL — ABNORMAL LOW (ref 3.5–5.0)
Albumin: 1.3 g/dL — ABNORMAL LOW (ref 3.5–5.0)
Anion gap: 4 — ABNORMAL LOW (ref 5–15)
Anion gap: 8 (ref 5–15)
BUN: 22 mg/dL — ABNORMAL HIGH (ref 6–20)
BUN: 23 mg/dL — ABNORMAL HIGH (ref 6–20)
CO2: 28 mmol/L (ref 22–32)
CO2: 23 mmol/L (ref 22–32)
Calcium: 7.7 mg/dL — ABNORMAL LOW (ref 8.9–10.3)
Calcium: 7.8 mg/dL — ABNORMAL LOW (ref 8.9–10.3)
Chloride: 102 mmol/L (ref 98–111)
Chloride: 103 mmol/L (ref 98–111)
Creatinine, Ser: 1.05 mg/dL — ABNORMAL HIGH (ref 0.44–1.00)
Creatinine, Ser: 1.09 mg/dL — ABNORMAL HIGH (ref 0.44–1.00)
GFR, Estimated: >60 mL/min (ref >60)
GFR, Estimated: >60 mL/min (ref >60)
Glucose, Bld: 190 mg/dL — ABNORMAL HIGH (ref 70–99)
Glucose, Bld: 189 mg/dL — ABNORMAL HIGH (ref 70–99)
Phosphorus: 3 mg/dL (ref 2.5–4.6)
Phosphorus: 3.4 mg/dL (ref 2.5–4.6)
Potassium: 4.2 mmol/L (ref 3.5–5.1)
Potassium: 4.5 mmol/L (ref 3.5–5.1)
Sodium: 134 mmol/L — ABNORMAL LOW (ref 135–145)
Sodium: 134 mmol/L — ABNORMAL LOW (ref 135–145)

## 2021-01-06 LAB — HEMOGLOBIN AND HEMATOCRIT, BLOOD
HCT: 21 % — ABNORMAL LOW (ref 36.0–46.0)
HCT: 25.2 % — ABNORMAL LOW (ref 36.0–46.0)
Hemoglobin: 6.9 g/dL — CL (ref 12.0–15.0)
Hemoglobin: 8.1 g/dL — ABNORMAL LOW (ref 12.0–15.0)

## 2021-01-06 LAB — CBC
HCT: 19.4 % — ABNORMAL LOW (ref 36.0–46.0)
Hemoglobin: 6.3 g/dL — CL (ref 12.0–15.0)
MCH: 32 pg (ref 26.0–34.0)
MCHC: 32.5 g/dL (ref 30.0–36.0)
MCV: 98.5 fL (ref 80.0–100.0)
Platelets: 147 10*3/uL — ABNORMAL LOW (ref 150–400)
RBC: 1.97 MIL/uL — ABNORMAL LOW (ref 3.87–5.11)
RDW: 15.5 % (ref 11.5–15.5)
WBC: 10.4 10*3/uL (ref 4.0–10.5)
nRBC: 0 % (ref 0.0–0.2)

## 2021-01-06 LAB — PREPARE RBC (CROSSMATCH)

## 2021-01-06 LAB — MAGNESIUM: Magnesium: 1.9 mg/dL (ref 1.7–2.4)

## 2021-01-06 MED ORDER — DOCUSATE SODIUM 100 MG PO CAPS: 100.0000 mg | ORAL_CAPSULE | Freq: Two times a day (BID) | ORAL | Status: DC

## 2021-01-06 MED ORDER — CLONAZEPAM 0.5 MG PO TBDP: 1.0000 mg | ORAL_TABLET | Freq: Three times a day (TID) | ORAL | Status: DC

## 2021-01-06 MED ORDER — SODIUM CHLORIDE 0.9% IV SOLUTION: Freq: Once | INTRAVENOUS | Status: AC

## 2021-01-06 MED ORDER — PANTOPRAZOLE SODIUM 40 MG PO PACK: 40.0000 mg | PACK | Freq: Every day | ORAL | Status: DC

## 2021-01-06 MED ORDER — OXYCODONE HCL 5 MG/5ML PO SOLN: 10.0000 mg | ORAL | Status: DC

## 2021-01-06 MED ORDER — POLYETHYLENE GLYCOL 3350 17 G PO PACK
17.0000 g | PACK | Freq: Every day | ORAL | Status: DC | PRN
  Administered 2021-01-07: 17 g via ORAL

## 2021-01-06 MED ORDER — PANTOPRAZOLE SODIUM 40 MG PO TBEC: 40.0000 mg | DELAYED_RELEASE_TABLET | Freq: Every day | ORAL | Status: DC

## 2021-01-06 MED ORDER — FENTANYL CITRATE (PF) 100 MCG/2ML IJ SOLN
50.0000 ug | INTRAMUSCULAR | Status: DC | PRN
Start: 2021-01-06 — End: 2021-01-08
  Administered 2021-01-06 – 2021-01-08 (×5): 50 ug via INTRAVENOUS
  Filled 2021-01-06 (×7): qty 2

## 2021-01-06 MED ORDER — B COMPLEX-C PO TABS
1.0000 | ORAL_TABLET | Freq: Every day | ORAL | Status: DC
  Filled 2021-01-06: qty 1

## 2021-01-06 MED ORDER — AMIODARONE HCL 200 MG PO TABS
200.0000 mg | ORAL_TABLET | Freq: Every day | ORAL | Status: DC
  Filled 2021-01-06: qty 1

## 2021-01-06 MED ORDER — DOCUSATE SODIUM 50 MG/5ML PO LIQD: 100.0000 mg | Freq: Two times a day (BID) | ORAL | Status: DC

## 2021-01-06 MED ORDER — QUETIAPINE FUMARATE 25 MG PO TABS: 25.0000 mg | ORAL_TABLET | Freq: Every day | ORAL | Status: DC

## 2021-01-06 MED ORDER — AMIODARONE HCL 200 MG PO TABS: 400.0000 mg | ORAL_TABLET | Freq: Two times a day (BID) | ORAL | Status: AC

## 2021-01-06 MED ORDER — POLYETHYLENE GLYCOL 3350 17 G PO PACK
17.0000 g | PACK | Freq: Every day | ORAL | Status: DC
  Filled 2021-01-06: qty 1

## 2021-01-06 NOTE — Progress Notes (Signed)
NAME:  [name] [name], MRN:  [id number], DOB:  07-14-1971, LOS: 9 ADMISSION DATE:  12/28/2020, CONSULTATION DATE:  6/3 REFERRING MD:  [facility] MD, CHIEF COMPLAINT:  Fever, pain and ulceration  History of Present Illness:  50 y/o female initially admitted to [facility] on 5/29 complaining of fever and a buttock abscess, moved to [facility] on 6/3 in the setting of worsening renal failure, acidosis, encephaloapthy with known group B strep wound culture.  Pertinent  Medical History  Recent Vulvar irritation and Bartholin Cyst seen at [facility]  HTN DM type II Cholecystectomy  GERD Anxiety  Significant Hospital Events:  5/29 admitted to [place] sepsis felt 2/2 cellulitis on buttocks. Cultures sent. CT abd/pelvis + [place] gas but no drainable abscess. Started on Vanc. IVFs. Wound also cultured.  6/1 wound culture back + group B strep. 1 of 4 blood cultures + GPC. ABX changed to rocephin 6/3 Arrived at [facility] from [place] w/ new hypotension, worsening renal failure, worsening leukocytosis, poorly controlled pain and new encephalopathy. Admitted to ICU. Started on zyvox and meropenem. Surgical consult requested> to OR for debridement 6/3 started CRRT, meropenem and Linezolid added 6/4 IVIg >6/6 6/5 Meropenem stopped 6/6 zosyn added  6/6 back to OR for debridement, in and out of atrial flutter; QTc 590; cardiology consult> amiodarone 6/7 off vasopressors, continues to have atrial flutter, started ketamine, Linezolid stopped 6/8 started on metoprolol, echo with LVEF 45-50%.  6/9 OR debridement  6/11 Fever curve improved, remains on CRRT   Interim History / Subjective:  No acute events overnight  More alert on Precedex   Objective   Blood pressure 118/68, pulse (!) 118, temperature 99.5 F (37.5 C), temperature source Axillary, resp. rate 18, height 5\' 2"  (1.575 m), weight 99.4 kg, SpO2 100 %.    Vent Mode: PRVC FiO2 (%):  [30 %] 30 % Set Rate:  [30 bmp] 30 bmp Vt Set:  [400 mL] 400  mL PEEP:  [5 cmH20] 5 cmH20 Pressure Support:  [8 cmH20] 8 cmH20 Plateau Pressure:  [14 cmH20-21 cmH20] 14 cmH20   Intake/Output Summary (Last 24 hours) at 01/06/2021 0830 Last data filed at 01/06/2021 0800 Gross per 24 hour  Intake 2198.15 ml  Output 2828 ml  Net -629.85 ml    Filed Weights   01/04/21 1345 01/05/21 0500 01/06/21 0400  Weight: 98.7 kg 99.5 kg 99.4 kg    Examination: General: Acute on chronically ill appearing adult female lying in bed on mechanical ventilation, in NAD HEENT: ETT, MM pink/moist, PERRL,  Neuro: Will awake to verbal stimuli and follow simple commands, non-focal  CV: s1s2 regular rate and rhythm, no murmur, rubs, or gallops,  PULM:  Clear to ascultation, no added breath sounds, no increased work of breathing  GI: soft, bowel sounds active in all 4 quadrants, non-tender, non-distended, tolerating tube feed Extremities: warm/dry, no edema  Skin: no rashes or lesions  Labs/imaging that I havepersonally reviewed   6/10 CXR LEFT basilar atelectasis and small RIGHT pleural effusion.  Resolved Hospital Problem list     Assessment & Plan:   Septic shock due to polymicrobial necrotizing buttock infection,  -s/p critical illness exploration and debridement x3 (6/3, 6/6, 6/9) P: Sepsis physiology improving  ID and General surgery following, appreciate assistance  S/p 3 days of IVIG and course of Linezolid for antitoxin effect  Trend CBC and fever curve  Follow culutre  Oliguric AKI -Secondary to presumed ischemic ATN in setting of severe sepsis resulting in hypotension multiple pressors Hyperkalemia -  improved P: Nephrology following, appreciate assistance Remains on CRRT Follow renal function  Monitor urine output Trend Bmet Avoid nephrotoxins Ensure adequate renal perfusion  Close monitoring of electrolytes   New onset atrial flutter -New onset in the setting of critical illness and sepsis Acute systolic and diastolic heart failure -Echo  with EF of 45 to 50%, likely secondary to tachycardia in the setting of critical illness, will need ischemic work-up once stabilized P: Cardiology following, appreciate assistance  Amiodarone to decreased to 200 BID per cardiology tomorrow  Strict intake and output  Optimize electrolytes  Beta-blocker/GDMT as hemodynamics allow  Will needed TEE/DCCV once anticoagulated initiated   Acute respiratory failure with hypoxemia -In the setting of severe septic shock P: Continue ventilator support with lung protective strategies  Mentation improved on Precedex, if able to tolerated SBT possible extubation  Wean PEEP and FiO2 for sats greater than 90%. Head of bed elevated 30 degrees. Plateau pressures less than 30 cm H20.  Follow intermittent chest x-ray and ABG.   Ensure adequate pulmonary hygiene  Follow cultures  VAP bundle in place  PAD protocol Continue nocturnal Seroquel   Acute metabolic encephalopathy Baseline narcotic dependence, high pain medication needs this admission P: Mentation improved on Precedex  Delirium precautions  Maintain neuro protective measures; goal for eurothermia, euglycemia, eunatermia, normoxia, and PCO2 goal of 35-40 Nutrition and bowel regiment  Seizure precautions  AEDs per neurology  Aspirations precautions    Maintain neuro protective measures  Delirium precautions Nutrition and bowel regiment  Seizure precautions  Aspirations precautions  Wean fentanyl today with use of Precedex If mentation does not improve need to consider Head CT  Mild normocytic anemia without bleeding P: Trend CBC Transfuse per protocol Hemoglobin goal greater than 7  DM2 with hyperglycemia P: Continue SSI CBG goal 1 40-1 80 CBG every 4 hours Continue to be coverage Continue long  Best practice   Diet:  Tube Feed  Pain/Anxiety/Delirium protocol (if indicated): Yes (RASS goal -2) VAP protocol (if indicated): Yes DVT prophylaxis: Subcutaneous Heparin GI  prophylaxis: H2B Glucose control:  SSI Yes Central venous access:  Yes, and it is still needed Arterial line:  Yes, and it is still needed Foley:  Yes, and it is still needed Mobility:  bed rest  PT consulted: Yes Last date of multidisciplinary goals of care discussion update patient's family daily Code Status:  full code Disposition: remain in ICU   CRITICAL CARE Performed by: Delfin Gant   Total critical care time: 38 minutes  Critical care time was exclusive of separately billable procedures and treating other patients.  Critical care was necessary to treat or prevent imminent or life-threatening deterioration.  Critical care was time spent personally by me on the following activities: development of treatment plan with patient and/or surrogate as well as nursing, discussions with consultants, evaluation of patient's response to treatment, examination of patient, obtaining history from patient or surrogate, ordering and performing treatments and interventions, ordering and review of laboratory studies, ordering and review of radiographic studies, pulse oximetry and re-evaluation of patient's condition.  Delfin Gant, NP-[facility] Personal contact information can be found on Amion  01/06/2021, 8:30 AM   Pulmonary critical care attending:  50 year old female, admitted with shock and buttock abscess developed septic shock multiorgan failure renal failure to be due to his group B strep.  Patient has been taken to the operating room for 3 separate debridements.  At this time remains intubated on mechanical  life support CVVHD still in place.  Off pressors.  She is tolerating SAT SBT this morning.  BP 93/60   Pulse (!) 112   Temp 99.5 F (37.5 C) (Axillary)   Resp (!) 27   Ht 5\' 2"  (1.575 m)   Wt 99.4 kg   SpO2 100%   BMI 40.08 kg/m   General: Female intubated on mechanical life support critically ill HEENT: Endotracheal tube in place Heart:  Regular rate rhythm, S1-S2 Lungs: Bilateral mechanically ventilated breath sounds Abdomen: Soft nontender nondistended Buttock wound per surgery service note.  Labs: Reviewed Assessment: Septic shock, polymicrobial necrotizing soft tissue skin infection status postdebridement by surgical services Acute renal failure requiring CVVHD Hyperkalemia resolved New onset atrial flutter Acute systolic and diastolic heart failure Acute metabolic encephalopathy secondary to above, improved Acute hypoxemic respiratory failure requiring intubation mechanical ventilation secondary to above  Plan: Continue IV Zosyn status post IVIG and course of linezolid for antitoxin effect Continue to wean vasopressors as tolerated, goal mean arterial pressure greater than 65 mmHg. Her mental status is much better after holding continuous sedation Continue PAD guidelines sedation Low-dose fentanyl as needed plus continuous Precedex. I think she will be able to extubate soon. We need to touch base with surgery services to see if they make any plans for additional OR visits. If OR is planned for tomorrow would hold off on extubation today. Consideration for extubation either early this afternoon or tomorrow morning. Wean from mechanical vent as tolerated Goal SPO2 greater than 90%.  This patient is critically ill with multiple organ system failure; which, requires frequent high complexity decision making, assessment, support, evaluation, and titration of therapies. This was completed through the application of advanced monitoring technologies and extensive interpretation of multiple databases. During this encounter critical care time was devoted to patient care services described in this note for 40 minutes.  , DO [facility] 01/06/2021 11:05 AM

## 2021-01-06 NOTE — Progress Notes (Signed)
Patient ID: [name] [name], female   DOB: 18-Jan-1971, 50 y.o.   MRN: [id number] S: Pt seen and examined while on CRRT.  Labs and orders reviewed.  Able to wean off levophed overnight. O:BP 118/68   Pulse (!) 118   Temp 99.5 F (37.5 C) (Axillary)   Resp 18   Ht _0  (1.575 m)   Wt 99.4 kg   SpO2 100%   BMI 40.08 kg/m   Intake/Output Summary (Last 24 hours) at 01/06/2021 1023 Last data filed at 01/06/2021 1000 Gross per 24 hour  Intake 2197.22 ml  Output 2504 ml  Net -306.78 ml   Intake/Output: I/O last 3 completed shifts: In: 3359.9 [I.V.:664.9; NG/GT:2345; IV Piggyback:350] Out: 6433 [IRJJO:8416; SAYTK:1601; Stool:450]  Intake/Output this shift:  Total I/O In: 234.2 [I.V.:54.2; NG/GT:180] Out: 228 [Urine:65; Other:138; Stool:25] Weight change: 0.7 kg Gen: intubated and sedated CVS: Tachy Resp: ventilated BS bilaterally Abd: +BS, soft,  Ext:+ edema of hands  Recent Labs  Lab 12/31/20 0330 12/31/20 0753 01/02/21 0510 01/02/21 1545 01/03/21 0932 01/04/21 0149 01/04/21 1843 01/05/21 0553 01/06/21 0847  NA 131*   < > 129*  130* 130* 132* 130* 133* 134* 134*  K 3.6   < > 4.5  4.6 4.5 4.3 5.6* 4.5 4.3 4.2  CL 97*   < > 100  100 100 98 101 100 103 102  CO2 26   < > _1 21* _2 GLUCOSE 268*   < > 194*  194* 168* 69* 208* 176* 200* 190*  BUN 27*   < > 23*  23* 22* 20 33* 34* 29* 22*  CREATININE 1.92*   < > 1.43*  1.44* 1.36* 1.22* 2.01* 2.12* 1.61* 1.05*  ALBUMIN 1.1*   < > 1.1*  1.1* 1.0* 1.1* 1.3* 1.3* 1.2* 1.2*  CALCIUM 7.3*   < > 7.6*  7.5* 7.2* 7.5* 7.5* 7.7* 7.6* 7.7*  PHOS  --    < > 3.2 3.3 2.8 4.9* 5.1* 4.2 3.0  AST 41  --  42*  --  38 34  --   --   --   ALT 19  --  21  --  21 20  --   --   --    < > = values in this interval not displayed.   Liver Function Tests: Recent Labs  Lab 01/02/21 0510 01/02/21 1545 01/03/21 3557 01/04/21 0149 01/04/21 1843 01/05/21 0553 01/06/21 0847  AST 42*  --  38 34  --   --   --   ALT 21  --   21 20  --   --   --   ALKPHOS 408*  --  389* 374*  --   --   --   BILITOT 2.0*  --  1.6* 1.4*  --   --   --   PROT 7.8  --  8.0 7.6  --   --   --   ALBUMIN 1.1*  1.1*   < > 1.1* 1.3* 1.3* 1.2* 1.2*   < > = values in this interval not displayed.   No results for input(s): LIPASE, AMYLASE in the last 168 hours. No results for input(s): AMMONIA in the last 168 hours. CBC: Recent Labs  Lab 12/31/20 0330 12/31/20 0753 01/02/21 0510 01/03/21 3220 01/04/21 0149 01/06/21 0847 01/06/21 0928  WBC 35.1*  --  23.5* 15.4* 15.1* 10.4  --   NEUTROABS 29.9*  --  19.1* 11.5*  --   --   --  HGB 9.8*   < > 7.3* 8.7* 8.3* 6.3* 6.9*  HCT 27.7*   < > 22.2* 26.3* 24.5* 19.4* 21.0*  MCV 90.2  --  94.9 94.9 93.9 98.5  --   PLT 144*  --  PLATELET CLUMPS NOTED ON SMEAR, UNABLE TO ESTIMATE PLATELET CLUMPS NOTED ON SMEAR, COUNT APPEARS DECREASED 165 147*  --    < > = values in this interval not displayed.   Cardiac Enzymes: No results for input(s): CKTOTAL, CKMB, CKMBINDEX, TROPONINI in the last 168 hours. CBG: Recent Labs  Lab 01/05/21 1520 01/05/21 1951 01/05/21 2333 01/06/21 0419 01/06/21 0725  GLUCAP 177* 204* 207* 158* 164*    Iron Studies: No results for input(s): IRON, TIBC, TRANSFERRIN, FERRITIN in the last 72 hours. Studies/Results: No results found.  sodium chloride   Intravenous Once   [START ON 01/07/2021] amiodarone  200 mg Per Tube Q1200   amiodarone  400 mg Per Tube BID   B-complex with vitamin C  1 tablet Per Tube Daily   chlorhexidine gluconate (MEDLINE KIT)  15 mL Mouth Rinse BID   Chlorhexidine Gluconate Cloth  6 each Topical Q0600   clonazepam  1 mg Per Tube TID   docusate  100 mg Per Tube BID   feeding supplement (PROSource TF)  45 mL Per Tube QID   heparin  5,000 Units Subcutaneous Q8H   insulin aspart  0-20 Units Subcutaneous Q4H   insulin aspart  3 Units Subcutaneous Q4H   insulin detemir  14 Units Subcutaneous BID   mouth rinse  15 mL Mouth Rinse 10 times per day    oxyCODONE  10 mg Per Tube Q4H   pantoprazole sodium  40 mg Per Tube Daily   polyethylene glycol  17 g Per Tube Daily   QUEtiapine  25 mg Per Tube QHS   sodium chloride flush  10-40 mL Intracatheter Q12H    BMET    Component Value Date/Time   NA 134 (L) 01/06/2021 0847   K 4.2 01/06/2021 0847   CL 102 01/06/2021 0847   CO2 28 01/06/2021 0847   GLUCOSE 190 (H) 01/06/2021 0847   BUN 22 (H) 01/06/2021 0847   CREATININE 1.05 (H) 01/06/2021 0847   CALCIUM 7.7 (L) 01/06/2021 0847   GFRNONAA >60 01/06/2021 0847   CBC    Component Value Date/Time   WBC 10.4 01/06/2021 0847   RBC 1.97 (L) 01/06/2021 0847   HGB 6.9 (LL) 01/06/2021 0928   HCT 21.0 (L) 01/06/2021 0928   PLT 147 (L) 01/06/2021 0847   MCV 98.5 01/06/2021 0847   MCH 32.0 01/06/2021 0847   MCHC 32.5 01/06/2021 0847   RDW 15.5 01/06/2021 0847   LYMPHSABS 2.2 01/03/2021 0638   MONOABS 1.3 (H) 01/03/2021 0638   EOSABS 0.1 01/03/2021 0638   BASOSABS 0.1 01/03/2021 5852       Assessment/Plan:   AKI - presumably ischemic ATN in setting of severe sepsis syndrome and hypotension requiring multiple pressors.  She was started on CRRT 12/28/20 vir RIJ HD catheter placed by PCCM.  Finally starting to make urine. Taken off of CVVHD 01/03/21 with plan to transition for IHD, however now with fevers and hypotension requiring pressors again. Resumed CRRT 01/04/21. Starting to make some urine and no longer oliguric CRRT orders:  dialysate 4K/2.5Ca at 1500 ml/hr, pre-filter 2K/2.5Ca 575m/hr, post-filter 300 ml/hr, Keep even, no heparin Hopefully we can stop CRRT in 24 hours if UOP continues to improve and remains off pressors.  Septic shock  due to necrotizing buttock infection - currently off levophed, s/p debridement on 12/28/20, 12/31/20, and 01/03/21 Spiked fever 01/03/21 following debridement and was back on pressors but now off. HD catheter is 10 days old and unlikely source but ID following and await recommendations.  May need to  remove/replace soon anyway. Hyperkalemia - improved with resumption of CRRT as above. VDRF - per PCCM Atrial flutter with RVR:  Cardiology following.  On amiodarone and off levophed. DM type 2 - per primary Acute metabolic encephalopathy Anemia of critical illness - transfuse prn. Hypophosphatemia - repleted IV and follow.  Donetta Potts, MD [employer] [phone]

## 2021-01-06 NOTE — Progress Notes (Signed)
PCCM Progress Note  Patient tolerated SBT trail x4 hrs well and remains alert and able to follow commands. She also has good volumes on CPAP trial. Therefore patient was extubated to [place].   Orders placed for [place] to help with nutrition, will assess bedside swallow ability but patient is expected to need with nutritional intake.  Delfin Gant, NP-C [facility] Personal contact information can be found on Amion  01/06/2021, 4:07 PM

## 2021-01-06 NOTE — Progress Notes (Signed)
Subjective: On the ventilator with sedation   Antibiotics:  Anti-infectives (From admission, onward)    Start     Dose/Rate Route Frequency Ordered Stop   01/04/21 1800  piperacillin-tazobactam (ZOSYN) IVPB 3.375 g        3.375 g 100 mL/hr over 30 Minutes Intravenous Every 6 hours 01/04/21 1059     01/03/21 1400  piperacillin-tazobactam (ZOSYN) IVPB 3.375 g  Status:  Discontinued        3.375 g 12.5 mL/hr over 240 Minutes Intravenous Every 8 hours 01/03/21 1124 01/04/21 1059   01/01/21 1800  piperacillin-tazobactam (ZOSYN) IVPB 3.375 g  Status:  Discontinued        3.375 g 12.5 mL/hr over 240 Minutes Intravenous Every 6 hours 01/01/21 1059 01/03/21 1124   12/31/20 1400  piperacillin-tazobactam (ZOSYN) IVPB 3.375 g  Status:  Discontinued        3.375 g 12.5 mL/hr over 240 Minutes Intravenous Every 6 hours 12/31/20 0918 01/01/21 1059   12/29/20 1400  clindamycin (CLEOCIN) IVPB 900 mg  Status:  Discontinued        900 mg 100 mL/hr over 30 Minutes Intravenous Every 8 hours 12/29/20 1214 12/29/20 1234   12/28/20 2200  linezolid (ZYVOX) IVPB 600 mg  Status:  Discontinued        600 mg 300 mL/hr over 60 Minutes Intravenous Every 12 hours 12/28/20 1620 01/02/21 0920   12/28/20 1830  meropenem (MERREM) 1 g in sodium chloride 0.9 % 100 mL IVPB  Status:  Discontinued        1 g 200 mL/hr over 30 Minutes Intravenous Every 8 hours 12/28/20 1731 12/31/20 0918   12/28/20 1800  meropenem (MERREM) 1 g in sodium chloride 0.9 % 100 mL IVPB  Status:  Discontinued        1 g 200 mL/hr over 30 Minutes Intravenous Every 24 hours 12/28/20 1700 12/28/20 1731   12/28/20 1730  meropenem (MERREM) 1 g in sodium chloride 0.9 % 100 mL IVPB  Status:  Discontinued        1 g 200 mL/hr over 30 Minutes Intravenous Every 8 hours 12/28/20 1639 12/28/20 1700       Medications: Scheduled Meds:  [START ON 01/07/2021] amiodarone  200 mg Per Tube Q1200   amiodarone  400 mg Per Tube BID   B-complex with  vitamin C  1 tablet Per Tube Daily   chlorhexidine gluconate (MEDLINE KIT)  15 mL Mouth Rinse BID   Chlorhexidine Gluconate Cloth  6 each Topical Q0600   clonazepam  1 mg Per Tube TID   docusate  100 mg Per Tube BID   feeding supplement (PROSource TF)  45 mL Per Tube QID   heparin  5,000 Units Subcutaneous Q8H   insulin aspart  0-20 Units Subcutaneous Q4H   insulin aspart  3 Units Subcutaneous Q4H   insulin detemir  14 Units Subcutaneous BID   mouth rinse  15 mL Mouth Rinse 10 times per day   oxyCODONE  10 mg Per Tube Q4H   pantoprazole sodium  40 mg Per Tube Daily   polyethylene glycol  17 g Per Tube Daily   QUEtiapine  25 mg Per Tube QHS   sodium chloride flush  10-40 mL Intracatheter Q12H   Continuous Infusions:  sodium chloride 10 mL/hr at 01/06/21 1426   dexmedetomidine (PRECEDEX) IV infusion 0.2 mcg/kg/hr (01/06/21 1426)   feeding supplement (VITAL 1.5 CAL) 1,000 mL (01/06/21 0800)   fentaNYL  infusion INTRAVENOUS Stopped (01/05/21 1209)   norepinephrine (LEVOPHED) Adult infusion 10 mcg/min (01/04/21 0008)   piperacillin-tazobactam Stopped (01/06/21 1214)   prismasol BGK 2/2.5 replacement solution 500 mL/hr at 01/06/21 0614   prismasol BGK 2/2.5 replacement solution 300 mL/hr at 01/05/21 0035   prismasol BGK 4/2.5 1,500 mL/hr at 01/06/21 1353   PRN Meds:.fentaNYL, heparin, heparin, levalbuterol, midazolam, ondansetron (ZOFRAN) IV, polyethylene glycol, sodium chloride flush    Objective: Weight change: 0.7 kg  Intake/Output Summary (Last 24 hours) at 01/06/2021 1517 Last data filed at 01/06/2021 1426 Gross per 24 hour  Intake 2477.01 ml  Output 2281 ml  Net 196.01 ml    Blood pressure 110/81, pulse (!) 114, temperature 98.5 F (36.9 C), temperature source Axillary, resp. rate 19, height 5' 2"  (1.575 m), weight 99.4 kg, SpO2 100 %. Temp:  [97.1 F (36.2 C)-99.5 F (37.5 C)] 98.5 F (36.9 C) (06/12 1300) Pulse Rate:  [94-231] 114 (06/12 1400) Resp:  [15-35] 19 (06/12  1400) BP: (69-120)/(53-101) 110/81 (06/12 1400) SpO2:  [98 %-100 %] 100 % (06/12 1400) FiO2 (%):  [30 %] 30 % (06/12 1140) Weight:  [99.4 kg] 99.4 kg (06/12 0400)  Physical Exam: Physical Exam Constitutional:      Appearance: She is ill-appearing.     Interventions: She is intubated.  HENT:     Head: Normocephalic and atraumatic.  Eyes:     Extraocular Movements: Extraocular movements intact.  Cardiovascular:     Heart sounds: No murmur heard.   No gallop.  Pulmonary:     Effort: She is intubated.     Breath sounds: No wheezing or rhonchi.  Skin:    Coloration: Skin is pale. Skin is not jaundiced.     Findings: No bruising.  Neurological:     General: No focal deficit present.    Central lines appear clean.  Wound not examined by me but examined by surgery earlier today.  CBC:    BMET Recent Labs    01/05/21 0553 01/06/21 0847  NA 134* 134*  K 4.3 4.2  CL 103 102  CO2 24 28  GLUCOSE 200* 190*  BUN 29* 22*  CREATININE 1.61* 1.05*  CALCIUM 7.6* 7.7*      Liver Panel  Recent Labs    01/04/21 0149 01/04/21 1843 01/05/21 0553 01/06/21 0847  PROT 7.6  --   --   --   ALBUMIN 1.3*   < > 1.2* 1.2*  AST 34  --   --   --   ALT 20  --   --   --   ALKPHOS 374*  --   --   --   BILITOT 1.4*  --   --   --    < > = values in this interval not displayed.        Sedimentation Rate No results for input(s): ESRSEDRATE in the last 72 hours. C-Reactive Protein No results for input(s): CRP in the last 72 hours.  Micro Results: Recent Results (from the past 720 hour(s))  MRSA PCR Screening     Status: None   Collection Time: 12/28/20  3:20 PM   Specimen: Nasopharyngeal  Result Value Ref Range Status   MRSA by PCR NEGATIVE NEGATIVE Final    Comment:        The GeneXpert MRSA Assay (FDA approved for NASAL specimens only), is one component of a comprehensive MRSA colonization surveillance program. It is not intended to diagnose MRSA infection nor to  guide or monitor  treatment for MRSA infections. Performed at [facility], [place] [address], [place], [place] [address]   Culture, blood (routine x 2)     Status: None   Collection Time: 12/28/20  4:43 PM   Specimen: BLOOD  Result Value Ref Range Status   Specimen Description BLOOD LEFT ANTECUBITAL  Final   Special Requests   Final    BOTTLES DRAWN AEROBIC AND ANAEROBIC Blood Culture adequate volume   Culture   Final    NO GROWTH 5 DAYS Performed at [facility], [place] [address] [place], [place] [address]    Report Status 01/02/2021 FINAL  Final  Culture, blood (Routine X 2) w Reflex to ID Panel     Status: None   Collection Time: 12/28/20  4:45 PM   Specimen: BLOOD LEFT ARM  Result Value Ref Range Status   Specimen Description BLOOD LEFT ARM  Final   Special Requests   Final    BOTTLES DRAWN AEROBIC ONLY Blood Culture adequate volume   Culture   Final    NO GROWTH 5 DAYS Performed at [facility], [place] [address] [place], [place] [address]    Report Status 01/02/2021 FINAL  Final  Aerobic/Anaerobic Culture w Gram Stain (surgical/deep wound)     Status: None   Collection Time: 12/28/20  9:22 PM   Specimen: Abscess  Result Value Ref Range Status   Specimen Description ABSCESS LEFT BUTTOCKS  Final   Special Requests SWABS  Final   Gram Stain   Final    MODERATE WBC PRESENT,BOTH PMN AND MONONUCLEAR ABUNDANT GRAM NEGATIVE RODS RARE GRAM POSITIVE COCCI IN PAIRS    Culture   Final    RARE STREPTOCOCCUS AGALACTIAE TESTING AGAINST S. AGALACTIAE NOT ROUTINELY PERFORMED DUE TO PREDICTABILITY OF AMP/PEN/VAN SUSCEPTIBILITY. MODERATE PREVOTELLA BIVIA BETA LACTAMASE POSITIVE Performed at [facility], [place] [address] [place], [place] [address]    Report Status 01/01/2021 FINAL  Final  Aerobic/Anaerobic Culture w Gram Stain (surgical/deep wound)     Status: None   Collection Time: 12/28/20  9:28 PM   Specimen: Abscess  Result Value Ref Range Status    Specimen Description TISSUE LEFT BUTTOCKS  Final   Special Requests NONE  Final   Gram Stain NO WBC SEEN NO ORGANISMS SEEN   Final   Culture   Final    RARE ESCHERICHIA COLI NO ANAEROBES ISOLATED Performed at [facility], [place] [address], [place], [place] [address]    Report Status 01/03/2021 FINAL  Final   Organism ID, Bacteria ESCHERICHIA COLI  Final      Susceptibility   Escherichia coli - MIC*    AMPICILLIN >=32 RESISTANT Resistant     CEFAZOLIN 8 SENSITIVE Sensitive     CEFEPIME <=0.12 SENSITIVE Sensitive     CEFTAZIDIME <=1 SENSITIVE Sensitive     CEFTRIAXONE <=0.25 SENSITIVE Sensitive     CIPROFLOXACIN <=0.25 SENSITIVE Sensitive     GENTAMICIN <=1 SENSITIVE Sensitive     IMIPENEM <=0.25 SENSITIVE Sensitive     TRIMETH/SULFA >=320 RESISTANT Resistant     AMPICILLIN/SULBACTAM >=32 RESISTANT Resistant     PIP/TAZO <=4 SENSITIVE Sensitive     * RARE ESCHERICHIA COLI  Culture, blood (routine x 2)     Status: None   Collection Time: 12/28/20 11:10 PM   Specimen: BLOOD  Result Value Ref Range Status   Specimen Description BLOOD RIGHT ARM  Final   Special Requests   Final  BOTTLES DRAWN AEROBIC AND ANAEROBIC Blood Culture adequate volume   Culture   Final    NO GROWTH 5 DAYS Performed at [facility], [place] [address] [place], [place] [address]    Report Status 01/03/2021 FINAL  Final  Culture, blood (Routine X 2) w Reflex to ID Panel     Status: None   Collection Time: 12/28/20 11:26 PM   Specimen: BLOOD  Result Value Ref Range Status   Specimen Description BLOOD LEFT ARM  Final   Special Requests   Final    BOTTLES DRAWN AEROBIC AND ANAEROBIC Blood Culture adequate volume   Culture   Final    NO GROWTH 5 DAYS Performed at [facility], [place] [address] [place], [place] [address]    Report Status 01/03/2021 FINAL  Final  Culture, blood (Routine X 2) w Reflex to ID Panel     Status: None (Preliminary result)   Collection Time: 01/04/21 10:18 AM    Specimen: BLOOD RIGHT HAND  Result Value Ref Range Status   Specimen Description BLOOD RIGHT HAND  Final   Special Requests   Final    BOTTLES DRAWN AEROBIC ONLY Blood Culture adequate volume   Culture   Final    NO GROWTH 2 DAYS Performed at [facility], [place] [address] [place], [place] [address]    Report Status PENDING  Incomplete  Culture, blood (Routine X 2) w Reflex to ID Panel     Status: None (Preliminary result)   Collection Time: 01/04/21 10:25 AM   Specimen: BLOOD LEFT HAND  Result Value Ref Range Status   Specimen Description BLOOD LEFT HAND  Final   Special Requests   Final    BOTTLES DRAWN AEROBIC ONLY Blood Culture adequate volume   Culture   Final    NO GROWTH 2 DAYS Performed at [facility], [place] [address], [place], [place] [address]    Report Status PENDING  Incomplete    Studies/Results: No results found.    Assessment/Plan:  INTERVAL HISTORY:   Patient seems to be stabilizing.   Principal Problem:   Necrotizing soft tissue infection Active Problems:   Septic shock ([place])   Sepsis due to Streptococcus, group B (HCC)   Acute kidney injury ([place])   Obesity   Type 2 diabetes mellitus with hyperglycemia (HCC)   Acute hypercapnic respiratory failure (HCC)   Typical atrial flutter (HCC)   Hyperkalemia   New onset a-fib ([place])   Acute combined systolic and diastolic heart failure ([place])   Acute metabolic encephalopathy   History of ETT    [name] [name] is a 50 y.o. female with polymicrobial necrotizing infection with sepsis she has now status post multiple debridements with cultures having yielded group B streptococci E. coli and Prevotella.  She remains on Zosyn.  Surgery of examined her wounds and they are happy with the progress and feel that she is doing well and did not require further debridement at this point in time.  Her pressor requirements seem to gone down slightly.  Chest x-ray done Friday did show a pleural effusion  I  would continue her current antibiotics with Zosyn  If she were to worsen clinically and her soft tissue infection was not the obvious source I would consider it therapeutic and diagnostic thoracentesis  Currently her fevers have defervesced to though she is on CVVHD.  I spent more than 35 minutes with the patient including greater than 50% of time in  face to face counseling of the patient personally reviewing radiographs, along with pertinent laboratory microbiological data review of medical records and in coordination of her care.  Dr. Juleen China will be back tomorrow.       LOS: 9 days   Alcide Evener 01/06/2021, 3:17 PM

## 2021-01-06 NOTE — Procedures (Signed)
Extubation Procedure Note  Patient Details:   Name: [name] [name] DOB: [birth year] MRN: [id number]   Airway Documentation:    Vent end date: 01/06/21 Vent end time: 1606   Evaluation  O2 sats: stable throughout Complications: No apparent complications Patient did tolerate procedure well. Bilateral Breath Sounds: Rhonchi, Diminished Placed on 6L min [place]   Yes  Newt Lukes 01/06/2021, 4:07 PM

## 2021-01-06 NOTE — Anesthesia Postprocedure Evaluation (Signed)
Anesthesia Post Note  Patient: Rickell Wiehe  Procedure(s) Performed: IRRIGATION AND DEBRIDEMENT LEFT BUTTOCK (Left: Buttocks)     Patient location during evaluation: PACU Anesthesia Type: General Level of consciousness: awake and alert Pain management: pain level controlled Vital Signs Assessment: post-procedure vital signs reviewed and stable Respiratory status: spontaneous breathing, nonlabored ventilation, respiratory function stable and patient connected to nasal cannula oxygen Cardiovascular status: blood pressure returned to baseline and stable Postop Assessment: no apparent nausea or vomiting Anesthetic complications: no   No notable events documented.  Last Vitals:  Vitals:   01/06/21 1700 01/06/21 1800  BP: (!) 92/54 105/62  Pulse: (!) 112 (!) 116  Resp: (!) 21 (!) 22  Temp:    SpO2: 100% 100%    Last Pain:  Vitals:   01/06/21 1747  TempSrc:   PainSc: 3                  Ahnika Hannibal

## 2021-01-06 NOTE — Progress Notes (Signed)
Progress Note  Patient Name: [name] [name] Date of Encounter: 01/06/2021  [facility] Cardiologist: Skeet Latch, MD (new)  Subjective   Remains intubated and sedated Appears to be in Aflutter with HR 120s; will obtain ECG Net negative 775 Remains on CRRT No plans for OR currently  Inpatient Medications    Scheduled Meds:  [START ON 01/07/2021] amiodarone  200 mg Per Tube Q1200   amiodarone  400 mg Per Tube BID   B-complex with vitamin C  1 tablet Per Tube Daily   chlorhexidine gluconate (MEDLINE KIT)  15 mL Mouth Rinse BID   Chlorhexidine Gluconate Cloth  6 each Topical Q0600   clonazepam  1 mg Per Tube TID   docusate  100 mg Per Tube BID   feeding supplement (PROSource TF)  45 mL Per Tube QID   heparin  5,000 Units Subcutaneous Q8H   insulin aspart  0-20 Units Subcutaneous Q4H   insulin aspart  3 Units Subcutaneous Q4H   insulin detemir  14 Units Subcutaneous BID   mouth rinse  15 mL Mouth Rinse 10 times per day   oxyCODONE  10 mg Per Tube Q4H   pantoprazole sodium  40 mg Per Tube Daily   polyethylene glycol  17 g Per Tube Daily   QUEtiapine  25 mg Per Tube QHS   sodium chloride flush  10-40 mL Intracatheter Q12H   Continuous Infusions:  sodium chloride 10 mL/hr at 01/06/21 0700   dexmedetomidine (PRECEDEX) IV infusion 0.2 mcg/kg/hr (01/06/21 0700)   feeding supplement (VITAL 1.5 CAL) 60 mL/hr at 01/06/21 0700   fentaNYL infusion INTRAVENOUS Stopped (01/05/21 1209)   norepinephrine (LEVOPHED) Adult infusion 10 mcg/min (01/04/21 0008)   piperacillin-tazobactam Stopped (01/06/21 0537)   prismasol BGK 2/2.5 replacement solution 500 mL/hr at 01/06/21 0614   prismasol BGK 2/2.5 replacement solution 300 mL/hr at 01/05/21 0035   prismasol BGK 4/2.5 1,500 mL/hr at 01/06/21 0613   PRN Meds: fentaNYL, heparin, heparin, levalbuterol, midazolam, ondansetron (ZOFRAN) IV, polyethylene glycol, sodium chloride flush   Vital Signs    Vitals:   01/06/21 0350 01/06/21  0400 01/06/21 0500 01/06/21 0600  BP: (!) 88/60 (!) 83/53 (!) 73/64 (!) 78/59  Pulse: (!) 108 (!) 107 (!) 112 (!) 107  Resp: (!) 30 (!) 30 (!) 24 (!) 30  Temp:  98.1 F (36.7 C)    TempSrc:      SpO2: 100% 100% 100% 100%  Weight:  99.4 kg    Height:        Intake/Output Summary (Last 24 hours) at 01/06/2021 0750 Last data filed at 01/06/2021 0700 Gross per 24 hour  Intake 2208.05 ml  Output 2984 ml  Net -775.95 ml    Last 3 Weights 01/06/2021 01/05/2021 01/04/2021  Weight (lbs) 219 lb 2.2 oz 219 lb 5.7 oz 217 lb 9.5 oz  Weight (kg) 99.4 kg 99.5 kg 98.7 kg      Telemetry    Aflutter with HR 90-120.- Personally Reviewed  ECG    No new tracing - Personally Reviewed  Physical Exam   VS:  BP (!) 78/59   Pulse (!) 107   Temp 98.1 F (36.7 C)   Resp (!) 30   Ht 5' 2"  (1.575 m)   Wt 99.4 kg   SpO2 100%   BMI 40.08 kg/m  , BMI Body mass index is 40.08 kg/m. GENERAL: Critically ill appearing, intubated and sedated HEENT: pupils reactive to light; ET tube in place NECK:  JVD difficult to  assess due to body habitus; no carotid bruits LUNGS:  Coarse vent sounds HEART: Tachycardic.  Regular rhythm. No murmurs ABD:  Soft, ND EXT:  Trace-to-1+ LE edema. Warm SKIN:  No rashes no nodules NEURO: Responsive to stimuli PSYCH:  Unable to assess   Labs    High Sensitivity Troponin:   Recent Labs  Lab 12/30/20 1023  TROPONINIHS 88*       Chemistry Recent Labs  Lab 01/02/21 0510 01/02/21 1545 01/03/21 0638 01/04/21 0149 01/04/21 1843 01/05/21 0553  NA 129*  130*   < > 132* 130* 133* 134*  K 4.5  4.6   < > 4.3 5.6* 4.5 4.3  CL 100  100   < > 98 101 100 103  CO2 22  23   < > 25 21* 22 24  GLUCOSE 194*  194*   < > 69* 208* 176* 200*  BUN 23*  23*   < > 20 33* 34* 29*  CREATININE 1.43*  1.44*   < > 1.22* 2.01* 2.12* 1.61*  CALCIUM 7.6*  7.5*   < > 7.5* 7.5* 7.7* 7.6*  PROT 7.8  --  8.0 7.6  --   --   ALBUMIN 1.1*  1.1*   < > 1.1* 1.3* 1.3* 1.2*  AST 42*   --  38 34  --   --   ALT 21  --  21 20  --   --   ALKPHOS 408*  --  389* 374*  --   --   BILITOT 2.0*  --  1.6* 1.4*  --   --   GFRNONAA 46*  45*   < > 55* 30* 28* 39*  ANIONGAP 7  7   < > 9 8 11 7    < > = values in this interval not displayed.      Hematology Recent Labs  Lab 01/02/21 0510 01/03/21 9163 01/04/21 0149  WBC 23.5* 15.4* 15.1*  RBC 2.34* 2.77* 2.61*  HGB 7.3* 8.7* 8.3*  HCT 22.2* 26.3* 24.5*  MCV 94.9 94.9 93.9  MCH 31.2 31.4 31.8  MCHC 32.9 33.1 33.9  RDW 14.8 14.7 14.7  PLT PLATELET CLUMPS NOTED ON SMEAR, UNABLE TO ESTIMATE PLATELET CLUMPS NOTED ON SMEAR, COUNT APPEARS DECREASED 165     BNP No results for input(s): BNP, PROBNP in the last 168 hours.    DDimer No results for input(s): DDIMER in the last 168 hours.   Radiology    DG CHEST PORT 1 VIEW  Result Date: 01/04/2021 CLINICAL DATA:  Fever, on ventilator, hypotension, hypokalemia EXAM: PORTABLE CHEST 1 VIEW COMPARISON:  Portable exam 1012 hours compared to 01/03/2021 FINDINGS: Tip of endotracheal tube projects 8.2 cm above carina. Nasogastric tube extends into stomach. Dual lumen RIGHT jugular central venous catheter with tip projecting over SVC. Enlargement of cardiac silhouette. Mediastinal contours and pulmonary vascularity normal. Subsegmental atelectasis at LEFT base. Small RIGHT pleural effusion. No pneumothorax. IMPRESSION: LEFT basilar atelectasis and small RIGHT pleural effusion. Electronically Signed   By: Lavonia Dana M.D.   On: 01/04/2021 12:36    Cardiac Studies   Echo 01/02/21: 1. Left ventricular ejection fraction, by estimation, is 45 to 50%. The  left ventricle has mildly decreased function. The left ventricle  demonstrates global hypokinesis. Left ventricular diastolic parameters  were normal.   2. Right ventricular systolic function is normal. The right ventricular  size is normal. There is normal pulmonary artery systolic pressure.   3. The mitral valve is normal in  structure.  Mild mitral valve  regurgitation. No evidence of mitral stenosis.   4. The aortic valve is normal in structure. Aortic valve regurgitation is  not visualized. No aortic stenosis is present.   5. The inferior vena cava is normal in size with greater than 50%  respiratory variability, suggesting right atrial pressure of 3 mmHg.   Patient Profile     Ms. Laux is a 12F with diabetes, hypertension, hyperlipidemia, GERD, and morbid obesity admitted with septic shock secondary to cellulitis and left buttock abscess.  Her hospitalization has been complicated by acute oliguric renal failure now on RRT and encephalopathy. Cardiology was consulted for atrial flutter with RVR and prolonged QTc.  Assessment & Plan    # Atrial flutter/Fib New this admission in the setting of critical illness and sepsis.  She has been on amiodarone and metoprolol, however, several doses of metop held due to hypotension. Currently off levophed. Current plan for 3 days of amiodarone 400 mg bid then 200 mg daily.  Continue to give metoprolol as BP allows. Per surgery, may not need further debridement unless clinically worsening. Will plan for TEE/DCCV once she can be on anticoagulation.  Would start heparin as soon as she is OK per surgery.   -Continue amiodarone 42m BID for 3 days total and then transition to 2015mdaily thereafter (to start tomorrow) -Will add metop as able as blood pressure allows -Start AC once okay from surgical standpoint -Will obtain ECG this morning to confirm rhythm  # Acute systolic and diastolic heart failure:  LVEF 45-50% this admission.  Likely 2/2 acute illness and tachycardia but given her risk factors she will need an ischemic evaluation once medically stable.  BP too low for GDMT.   -Volume management with HD -Unable to tolerate GDMT due to hypotension; will add as able -Will add metop as BP allows -Once clinical improvement, will need ischemic evaluation  #Septic Shock: #Necrotizing  soft tissue infection of the left buttock: S/p debridement with intra-op cultures growing GBS, E. coli, and Prevotella. -Management per surgery, PCCM, ID  #Acute Respiratory Failure: Occurred in setting of septic shock. Remains intubated. -Vent management per PCCM  #Oliguric Renal Failure: On CRRT. -Management per renal and PCCM  For questions or updates, please contact [facility] consult [email or web address] for contact info under        Signed, HeFreada BergeronMD  01/06/2021, 7:50 AM

## 2021-01-07 ENCOUNTER — Inpatient Hospital Stay (HOSPITAL_COMMUNITY): Payer: Medicaid Other

## 2021-01-07 DIAGNOSIS — R509 Fever, unspecified: Secondary | ICD-10-CM | POA: Diagnosis not present

## 2021-01-07 DIAGNOSIS — J9602 Acute respiratory failure with hypercapnia: Secondary | ICD-10-CM | POA: Diagnosis not present

## 2021-01-07 DIAGNOSIS — N179 Acute kidney failure, unspecified: Secondary | ICD-10-CM | POA: Diagnosis not present

## 2021-01-07 LAB — CBC
HCT: 22.9 % — ABNORMAL LOW (ref 36.0–46.0)
Hemoglobin: 7.5 g/dL — ABNORMAL LOW (ref 12.0–15.0)
MCH: 32.1 pg (ref 26.0–34.0)
MCHC: 32.8 g/dL (ref 30.0–36.0)
MCV: 97.9 fL (ref 80.0–100.0)
Platelets: 209 10*3/uL (ref 150–400)
RBC: 2.34 MIL/uL — ABNORMAL LOW (ref 3.87–5.11)
RDW: 16.6 % — ABNORMAL HIGH (ref 11.5–15.5)
WBC: 10.2 10*3/uL (ref 4.0–10.5)
nRBC: 0 % (ref 0.0–0.2)

## 2021-01-07 LAB — RENAL FUNCTION PANEL
Albumin: 1.4 g/dL — ABNORMAL LOW (ref 3.5–5.0)
Albumin: 1.3 g/dL — ABNORMAL LOW (ref 3.5–5.0)
Albumin: 1.7 g/dL — ABNORMAL LOW (ref 3.5–5.0)
Anion gap: 9 (ref 5–15)
Anion gap: 5 (ref 5–15)
Anion gap: 7 (ref 5–15)
BUN: 19 mg/dL (ref 6–20)
BUN: 17 mg/dL (ref 6–20)
BUN: 15 mg/dL (ref 6–20)
CO2: 25 mmol/L (ref 22–32)
CO2: 28 mmol/L (ref 22–32)
CO2: 27 mmol/L (ref 22–32)
Calcium: 7.7 mg/dL — ABNORMAL LOW (ref 8.9–10.3)
Calcium: 7.7 mg/dL — ABNORMAL LOW (ref 8.9–10.3)
Calcium: 8 mg/dL — ABNORMAL LOW (ref 8.9–10.3)
Chloride: 101 mmol/L (ref 98–111)
Chloride: 101 mmol/L (ref 98–111)
Chloride: 100 mmol/L (ref 98–111)
Creatinine, Ser: 0.95 mg/dL (ref 0.44–1.00)
Creatinine, Ser: 0.86 mg/dL (ref 0.44–1.00)
Creatinine, Ser: 0.87 mg/dL (ref 0.44–1.00)
GFR, Estimated: >60 mL/min (ref >60)
GFR, Estimated: >60 mL/min (ref >60)
GFR, Estimated: >60 mL/min (ref >60)
Glucose, Bld: 132 mg/dL — ABNORMAL HIGH (ref 70–99)
Glucose, Bld: 94 mg/dL (ref 70–99)
Glucose, Bld: 139 mg/dL — ABNORMAL HIGH (ref 70–99)
Phosphorus: 3.1 mg/dL (ref 2.5–4.6)
Phosphorus: 2.9 mg/dL (ref 2.5–4.6)
Phosphorus: 2.9 mg/dL (ref 2.5–4.6)
Potassium: 4 mmol/L (ref 3.5–5.1)
Potassium: 3.9 mmol/L (ref 3.5–5.1)
Potassium: 4.4 mmol/L (ref 3.5–5.1)
Sodium: 135 mmol/L (ref 135–145)
Sodium: 134 mmol/L — ABNORMAL LOW (ref 135–145)
Sodium: 134 mmol/L — ABNORMAL LOW (ref 135–145)

## 2021-01-07 LAB — GLUCOSE, CAPILLARY
Glucose-Capillary: 80 mg/dL (ref 70–99)
Glucose-Capillary: 80 mg/dL (ref 70–99)
Glucose-Capillary: 106 mg/dL — ABNORMAL HIGH (ref 70–99)
Glucose-Capillary: 114 mg/dL — ABNORMAL HIGH (ref 70–99)
Glucose-Capillary: 139 mg/dL — ABNORMAL HIGH (ref 70–99)
Glucose-Capillary: 157 mg/dL — ABNORMAL HIGH (ref 70–99)
Glucose-Capillary: 197 mg/dL — ABNORMAL HIGH (ref 70–99)
Glucose-Capillary: 168 mg/dL — ABNORMAL HIGH (ref 70–99)

## 2021-01-07 LAB — MAGNESIUM: Magnesium: 1.8 mg/dL (ref 1.7–2.4)

## 2021-01-07 MED ORDER — PANTOPRAZOLE SODIUM 40 MG PO PACK
40.0000 mg | PACK | Freq: Every day | ORAL | Status: DC
  Administered 2021-01-08 – 2021-01-12 (×5): 40 mg
  Filled 2021-01-07 (×6): qty 20

## 2021-01-07 MED ORDER — CLONAZEPAM 0.5 MG PO TBDP: 0.5000 mg | ORAL_TABLET | Freq: Three times a day (TID) | ORAL | Status: DC

## 2021-01-07 MED ORDER — VITAL 1.5 CAL PO LIQD
1000.0000 mL | ORAL | Status: DC
  Administered 2021-01-07 – 2021-01-10 (×4): 1000 mL

## 2021-01-07 MED ORDER — AMIODARONE HCL 200 MG PO TABS
200.0000 mg | ORAL_TABLET | Freq: Every day | ORAL | Status: DC
  Administered 2021-01-07 – 2021-01-11 (×5): 200 mg
  Filled 2021-01-07 (×4): qty 1

## 2021-01-07 MED ORDER — MIDODRINE HCL 5 MG PO TABS
10.0000 mg | ORAL_TABLET | Freq: Three times a day (TID) | ORAL | Status: DC
  Administered 2021-01-07 – 2021-01-09 (×8): 10 mg
  Filled 2021-01-07 (×7): qty 2

## 2021-01-07 MED ORDER — DEXTROSE 10 % IV SOLN: INTRAVENOUS | Status: DC

## 2021-01-07 MED ORDER — PANTOPRAZOLE SODIUM 40 MG IV SOLR
40.0000 mg | Freq: Once | INTRAVENOUS | Status: AC
  Administered 2021-01-07: 40 mg via INTRAVENOUS
  Filled 2021-01-07: qty 40

## 2021-01-07 MED ORDER — QUETIAPINE FUMARATE 25 MG PO TABS
25.0000 mg | ORAL_TABLET | Freq: Every day | ORAL | Status: DC
  Administered 2021-01-07 – 2021-01-08 (×2): 25 mg
  Filled 2021-01-07 (×2): qty 1

## 2021-01-07 MED ORDER — POLYETHYLENE GLYCOL 3350 17 G PO PACK: 17.0000 g | PACK | Freq: Every day | ORAL | Status: DC | PRN

## 2021-01-07 MED ORDER — PROSOURCE TF PO LIQD
45.0000 mL | Freq: Four times a day (QID) | ORAL | Status: DC
  Administered 2021-01-07 – 2021-01-10 (×12): 45 mL
  Filled 2021-01-07 (×11): qty 45

## 2021-01-07 MED ORDER — POLYETHYLENE GLYCOL 3350 17 G PO PACK: 17.0000 g | PACK | Freq: Every day | ORAL | Status: DC

## 2021-01-07 MED ORDER — MAGNESIUM SULFATE 2 GM/50ML IV SOLN
2.0000 g | Freq: Once | INTRAVENOUS | Status: AC
  Administered 2021-01-07: 2 g via INTRAVENOUS
  Filled 2021-01-07: qty 50

## 2021-01-07 MED ORDER — OXYCODONE HCL 5 MG/5ML PO SOLN
10.0000 mg | ORAL | Status: DC
  Administered 2021-01-07 – 2021-01-08 (×5): 10 mg
  Filled 2021-01-07 (×5): qty 10

## 2021-01-07 MED ORDER — PRISMASOL BGK 4/2.5 32-4-2.5 MEQ/L REPLACEMENT SOLN
Status: DC
  Filled 2021-01-07 (×4): qty 5000

## 2021-01-07 MED ORDER — MIDODRINE HCL 5 MG PO TABS
10.0000 mg | ORAL_TABLET | Freq: Three times a day (TID) | ORAL | Status: DC
  Filled 2021-01-07: qty 2

## 2021-01-07 MED ORDER — B COMPLEX-C PO TABS
1.0000 | ORAL_TABLET | Freq: Every day | ORAL | Status: DC
  Administered 2021-01-07 – 2021-01-08 (×2): 1
  Filled 2021-01-07: qty 1

## 2021-01-07 MED ORDER — ORAL CARE MOUTH RINSE
15.0000 mL | Freq: Two times a day (BID) | OROMUCOSAL | Status: DC
  Administered 2021-01-07 – 2021-01-19 (×24): 15 mL via OROMUCOSAL

## 2021-01-07 MED ORDER — ALBUMIN HUMAN 25 % IV SOLN
25.0000 g | Freq: Four times a day (QID) | INTRAVENOUS | Status: AC
  Administered 2021-01-07 – 2021-01-08 (×4): 25 g via INTRAVENOUS
  Filled 2021-01-07 (×4): qty 100

## 2021-01-07 MED ORDER — CLONAZEPAM 0.5 MG PO TBDP
0.5000 mg | ORAL_TABLET | Freq: Three times a day (TID) | ORAL | Status: DC
  Administered 2021-01-07 (×2): 0.5 mg
  Filled 2021-01-07 (×2): qty 1

## 2021-01-07 MED ORDER — DOCUSATE SODIUM 100 MG PO CAPS: 100.0000 mg | ORAL_CAPSULE | Freq: Every day | ORAL | Status: DC

## 2021-01-07 MED ORDER — DOCUSATE SODIUM 50 MG/5ML PO LIQD
50.0000 mg | Freq: Every day | ORAL | Status: DC
  Administered 2021-01-10: 50 mg
  Filled 2021-01-07 (×4): qty 10

## 2021-01-07 MED ORDER — PRISMASOL BGK 4/2.5 32-4-2.5 MEQ/L REPLACEMENT SOLN
Status: DC
  Filled 2021-01-07 (×3): qty 5000

## 2021-01-07 NOTE — Progress Notes (Addendum)
NAME:  [name] [name], MRN:  [id number], DOB:  04-02-1971, LOS: 10 ADMISSION DATE:  12/28/2020, CONSULTATION DATE:  6/3 REFERRING MD:  [facility] MD, CHIEF COMPLAINT:  Fever, pain and ulceration  History of Present Illness:  50 y/o female initially admitted to [facility] on 5/29 complaining of fever and a buttock abscess, moved to [facility] on 6/3 in the setting of worsening renal failure, acidosis, encephaloapthy with known group B strep wound culture.  Pertinent  Medical History  Recent Vulvar irritation and Bartholin Cyst seen at [facility]  HTN DM type II Cholecystectomy  GERD Anxiety  Significant Hospital Events:  5/29 admitted to [facility] sepsis felt 2/2 cellulitis on buttocks. Cultures sent. CT abd/pelvis + Ripley gas but no drainable abscess. Started on Vanc. IVFs. Wound also cultured.  6/1 wound culture back + group B strep. 1 of 4 blood cultures + GPC. ABX changed to rocephin 6/3 Arrived at [facility] from [place] w/ new hypotension, worsening renal failure, worsening leukocytosis, poorly controlled pain and new encephalopathy. Admitted to ICU. Started on zyvox and meropenem. Surgical consult requested> to OR for debridement 6/3 started CRRT, meropenem and Linezolid added 6/4 IVIg >6/6 6/5 Meropenem stopped 6/6 zosyn added  6/6 back to OR for debridement, in and out of atrial flutter; QTc 590; cardiology consult> amiodarone 6/7 off vasopressors, continues to have atrial flutter, started ketamine, Linezolid stopped 6/8 started on metoprolol, echo with LVEF 45-50%.  6/9 OR debridement  6/11 Fever curve improved, remains on CRRT  6/12 extubated  Interim History / Subjective:  No events, off vent and precedex. Remains on CRRT with pulls limited by BP. She denies pain.  Objective   Blood pressure 90/69, pulse (!) 109, temperature 97.6 F (36.4 C), temperature source Oral, resp. rate 10, height 5\' 2"  (1.575 m), weight 99.2 kg, SpO2 100 %.    Vent Mode: PSV;CPAP FiO2 (%):  [30 %] 30  % PEEP:  [5 cmH20] 5 cmH20 Pressure Support:  [8 cmH20] 8 cmH20   Intake/Output Summary (Last 24 hours) at 01/07/2021 01/09/2021 Last data filed at 01/07/2021 0804 Gross per 24 hour  Intake 1443.94 ml  Output 1883 ml  Net -439.06 ml    Filed Weights   01/05/21 0500 01/06/21 0400 01/07/21 0459  Weight: 99.5 kg 99.4 kg 99.2 kg    Examination: Constitutional: frail woman in NAD  Eyes: disconjugate gaze, pupils equal Ears, nose, mouth, and throat: MMM, trachea midline Cardiovascular: slightly tachycardic, ext warm Respiratory: diminished bases due to weak effort, normal RR Gastrointestinal: Soft, +BS Skin: No rashes, normal turgor, defer wound exam to surgery Neurologic: moves all 4 ext to command, profoundly weak Psychiatric: RASS 0   Labs/imaging that I havepersonally reviewed   Albumin 1.3 Sugars low, on D10 gtt  Resolved Hospital Problem list     Assessment & Plan:   Septic shock due to polymicrobial necrotizing buttock infection Septic ATN, hyperkalemia on CRRT Afib, new onset in setting of sepsis ABLA in setting of surgery- last transfused 6/12 Heart failure question Afib/RVR induced Acute respiratory failure with hypoxemia- improved Baseline narcotic and benzo dependence Metabolic encephalopathy improving DM2 with hypoglycemia  - D/C D50 and levemir, cortrak and restart PRN - Potential hydrotherapy per CCS - Watch H/H, pressor needs, eventual heparin challenge and consideration for cardioversion down the line per cardiology team - Try to get neg as able w/ CRRT, discussed with nephro: add some albumin and midodrine - 2 weeks zosyn after 6/9 (presumed source control): 6/23 - Drop down benzos slightly -  PRN fentanyl as ordered - PT/OT/SLP input appreciated - Continue rectal tube, may try to get foley out, will d/w nursing  Best practice   Diet:  Tube Feed  Pain/Anxiety/Delirium protocol (if indicated): No VAP protocol (if indicated): No DVT prophylaxis:  Subcutaneous Heparin GI prophylaxis: H2B Glucose control:  SSI Yes Central venous access:  Yes, and it is still needed Arterial line:  Yes, and it is still needed Foley:  Will discuss with nursing to consider changing to purewick Mobility:  bed rest  PT consulted: Yes Last date of multidisciplinary goals of care discussion will update when they come in Code Status:  full code Disposition: remain in ICU   Patient critically ill due to shock, renal failure, necrotizing infection Interventions to address this today midodrine, abx, CRRT, CCS/cardiology discussion Risk of deterioration without these interventions is high  I personally spent 41 minutes providing critical care not including any separately billable procedures  Myrla Halsted MD [facility]  Prefer epic messenger for cross cover needs If after hours, please call E-link

## 2021-01-07 NOTE — Progress Notes (Signed)
eLink Physician-Brief Progress Note Patient Name: [name] [name] DOB: Dec 20, 1970 MRN: [id number]   Date of Service  01/07/2021  HPI/Events of Note  Last CBG 80. Patient is NPO/ off tube feeds since extubation.   eICU Interventions  Ordered D10 @ 30 mL/hr.     Intervention Category Intermediate Interventions: Other:  [name] [name] 01/07/2021, 4:48 AM

## 2021-01-07 NOTE — Progress Notes (Signed)
SLP Cancellation Note  Patient Details Name: [name] [name] MRN: [id number] DOB: 06/05/1971   Cancelled treatment:       Reason Eval/Treat Not Completed: Patient not medically ready. Lethargic, recovering from fentanyl this am.    Claudine Mouton 01/07/2021, 1:41 PM

## 2021-01-07 NOTE — Progress Notes (Addendum)
4 Days Post-Op   Subjective/Chief Complaint: Pt with no acute issues. On CRRT, renal in the room now.  TF held since extubation yesterday, to get cortrak today.  Off pressors, WBC 10   Objective: Vital signs in last 24 hours: Temp:  [97.6 F (36.4 C)-99 F (37.2 C)] 97.6 F (36.4 C) (06/13 0754) Pulse Rate:  [93-118] 109 (06/13 0700) Resp:  [10-30] 10 (06/13 0700) BP: (75-113)/(44-81) 90/69 (06/13 0700) SpO2:  [83 %-100 %] 100 % (06/13 0700) FiO2 (%):  [30 %] 30 % (06/12 1553) Weight:  [99.2 kg] 99.2 kg (06/13 0459) Last BM Date: 01/06/21  Intake/Output from previous day: 06/12 0701 - 06/13 0700 In: 1456.6 [I.V.:282.7; Blood:374; NG/GT:600; IV Piggyback:199.9] Out: 1825 [Urine:630; Stool:245] Intake/Output this shift: Total I/O In: 62.4 [I.V.:62.4] Out: 128 [Urine:100; Other:28]  General appearance: extubated, A&Ox3 Incision/Wound: base 60% pink granulation tissue, >35% fibrinous exudate, <5% necrotic (very small amount ~4cm necrosis at medial wound edge). Tunneling 6-8 cm anteriorly towards the left labia   Lab Results:  Recent Labs    01/06/21 0847 01/06/21 0928 01/06/21 1547  WBC 10.4  --   --   HGB 6.3* 6.9* 8.1*  HCT 19.4* 21.0* 25.2*  PLT 147*  --   --    BMET Recent Labs    01/06/21 1547 01/07/21 0030  NA 134* 135  K 4.5 4.0  CL 103 101  CO2 23 25  GLUCOSE 189* 132*  BUN 23* 19  CREATININE 1.09* 0.95  CALCIUM 7.8* 7.7*   PT/INR  Anti-infectives: Anti-infectives (From admission, onward)    Start     Dose/Rate Route Frequency Ordered Stop   01/04/21 1800  piperacillin-tazobactam (ZOSYN) IVPB 3.375 g        3.375 g 100 mL/hr over 30 Minutes Intravenous Every 6 hours 01/04/21 1059     01/03/21 1400  piperacillin-tazobactam (ZOSYN) IVPB 3.375 g  Status:  Discontinued        3.375 g 12.5 mL/hr over 240 Minutes Intravenous Every 8 hours 01/03/21 1124 01/04/21 1059   01/01/21 1800  piperacillin-tazobactam (ZOSYN) IVPB 3.375 g  Status:   Discontinued        3.375 g 12.5 mL/hr over 240 Minutes Intravenous Every 6 hours 01/01/21 1059 01/03/21 1124   12/31/20 1400  piperacillin-tazobactam (ZOSYN) IVPB 3.375 g  Status:  Discontinued        3.375 g 12.5 mL/hr over 240 Minutes Intravenous Every 6 hours 12/31/20 0918 01/01/21 1059   12/29/20 1400  clindamycin (CLEOCIN) IVPB 900 mg  Status:  Discontinued        900 mg 100 mL/hr over 30 Minutes Intravenous Every 8 hours 12/29/20 1214 12/29/20 1234   12/28/20 2200  linezolid (ZYVOX) IVPB 600 mg  Status:  Discontinued        600 mg 300 mL/hr over 60 Minutes Intravenous Every 12 hours 12/28/20 1620 01/02/21 0920   12/28/20 1830  meropenem (MERREM) 1 g in sodium chloride 0.9 % 100 mL IVPB  Status:  Discontinued        1 g 200 mL/hr over 30 Minutes Intravenous Every 8 hours 12/28/20 1731 12/31/20 0918   12/28/20 1800  meropenem (MERREM) 1 g in sodium chloride 0.9 % 100 mL IVPB  Status:  Discontinued        1 g 200 mL/hr over 30 Minutes Intravenous Every 24 hours 12/28/20 1700 12/28/20 1731   12/28/20 1730  meropenem (MERREM) 1 g in sodium chloride 0.9 % 100 mL IVPB  Status:  Discontinued        1 g 200 mL/hr over 30 Minutes Intravenous Every 8 hours 12/28/20 1639 12/28/20 1700       Assessment/Plan: Necrotizing soft tissue infection left buttock POD#10 S/P DEBRIDEMENT LEFT BUTTOCK NECROTIZING SOFT TISSUE INFECTION - TOTAL TISSUE VOLUME 14CM X 12CM X 3CM DEEP 12/28/2020  Dr. Violeta Gelinas POD#7 S/P INCISION AND DRAINAGE ABSCESS BUTTOCK (Left), 16x15x3cm wound; 6/6 Dr. Derrell Lolling POD#4 S/P IRRIGATION AND DEBRIDEMENT LEFT BUTTOCK 6/9 Dr. Derrell Lolling  - WBC WNL - Intraoperative cultures 6/3 with GBS, E. coli, and Prevotella - continue moist-to-dry dressing changes.  - extubated, SLP eval and ADAT per SLP - continue IV abx per ID - wound appears to be healing well.  Do not recommend further debridement in OR at this time. PT hydrotherapy ordered for ongoing bedside debridement. Cardiology  following and would like to start anticoagulation when patient is stable enough. Given blood transfusion yesterday, soft BP's requiring midodrine and intermittent pressor support, would hold off on starting anticoagulation for now but potentially sometime this week hep gtt would be ok.  Below per CCM: VDRF Septic shock Renal failure - CRRT per renal, now making urine DM2 Obesity   FEN: NPO, IVF, TF once cortrak in ID:  merrem 6/3-6/6, linezolid 6/3-6/8, Zosyn 6/6 >> abx per ID VTE: SCD's, SQH Foley: in place for strict I&O's, placed 6/3 Dispo: ICU   LOS: 10 days    [name] [name] 01/07/2021

## 2021-01-07 NOTE — Progress Notes (Signed)
Inpatient Diabetes Program Recommendations  AACE/ADA: New Consensus Statement on Inpatient Glycemic Control (2015)  Target Ranges:  Prepandial:   less than 140 mg/dL      Peak postprandial:   less than 180 mg/dL (1-2 hours)      Critically ill patients:  140 - 180 mg/dL   Lab Results  Component Value Date   GLUCAP 114 (H) 01/07/2021   HGBA1C 11.1 (H) 12/28/2020    Review of Glycemic Control Results for BENNYE, NIX (MRN [id number]) as of 01/07/2021 13:19  Ref. Range 01/07/2021 04:31 01/07/2021 07:07 01/07/2021 08:57 01/07/2021 11:05  Glucose-Capillary Latest Ref Range: 70 - 99 mg/dL 80 80 591 (H) 638 (H)   Diabetes history: Type 2 DM Current orders for Inpatient glycemic control: Novolog 0-20 units Q4H Vital @ 60 ml/hr  Inpatient Diabetes Program Recommendations:     With start of tube feeds, if CBG trends >180's mg/dL consider adding Novolog 3 units for tube feed coverage (to be stopped or held in the event tube feeds stopped).   Thanks, Lujean Rave, MSN, RNC-OB Diabetes Coordinator [phone] (8a-5p)

## 2021-01-07 NOTE — Procedures (Signed)
Cortrak  Person Inserting Tube:  [name] [name], [name] [name], RD Tube Type:  Cortrak - 43 inches Tube Size:  10 Tube Location:  Left nare Initial Placement:  Stomach Secured by: Bridle Technique Used to Measure Tube Placement:  Marking at nare/corner of mouth Cortrak Secured At:  64 cm  Cortrak Tube Team Note:  Consult received to place a Cortrak feeding tube.   X-ray is required, abdominal x-ray has been ordered by the Cortrak team. Please confirm tube placement before using the Cortrak tube.   If the tube becomes dislodged please keep the tube and contact the Cortrak team at [email or web address] (password [email or web address]) for replacement.  If after hours and replacement cannot be delayed, place a NG tube and confirm placement with an abdominal x-ray.    [name] Gavia, MS, RD, LDN RD pager number and weekend/on-call pager number located in [place].

## 2021-01-07 NOTE — Progress Notes (Signed)
[facility] Progress Note   Assessment/ Plan:    AKI - presumably ischemic ATN in setting of severe sepsis syndrome and hypotension requiring multiple pressors.  She was started on CRRT 12/28/20 vir RIJ HD catheter placed by PCCM.   Taken off of CVVHD 01/03/21 with plan to transition for IHD, however now with fevers and hypotension requiring pressors again. Resumed CRRT 01/04/21. CRRT orders:  dialysate 4K/2.5Ca at 1500 ml/hr, pre-filter 2K/2.5Ca 528ml/hr, post-filter 300 ml/hr Will try albumin today to improve BP and pull fluid- net neg 50-100 mL/ hr--> likely 24ish more hours on CRRT 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 - currently off levophed, s/p debridement on 12/28/20, 12/31/20, and 01/03/21, surgery following and no more plans for OR as of now Spiked fever 01/03/21 following debridement and was back on pressors but now off. HD catheter is 92 days old and unlikely source but ID following and await recommendations.  May need to remove/replace soon anyway. Hyperkalemia - improved with resumption of CRRT as above. VDRF - per PCCM Atrial flutter with RVR:  Cardiology following.  On amiodarone and off levophed. DM type 2 - per primary Acute metabolic encephalopathy Anemia of critical illness - transfuse prn. Hypophosphatemia - repleted IV and follow.  Subjective:    Getting sacral wound dressed today.  Remains on CRRT.    Objective:   BP (!) 88/59   Pulse (!) 104   Temp 97.6 F (36.4 C) (Oral)   Resp (!) 21   Ht 5\' 2"  (1.575 m)   Wt 99.2 kg   SpO2 97%   BMI 40.00 kg/m   Physical Exam: Gen: rolled on side, uncomfortable CVS: RRR Resp: clear Abd: soft GU: flexiseal in place Ext: trace LE edema ACCESS: R IJ nontunneled HD catheter  Labs: BMET Recent Labs  Lab 01/04/21 0149 01/04/21 1843 01/05/21 0553 01/06/21 0847 01/06/21 1547 01/07/21 0030 01/07/21 0742  NA 130* 133* 134* 134* 134* 135 134*  K 5.6* 4.5 4.3 4.2 4.5 4.0 3.9  CL 101 100 103 102  103 101 101  CO2 21* 22 24 28 23 25 28   GLUCOSE 208* 176* 200* 190* 189* 132* 94  BUN 33* 34* 29* 22* 23* 19 17  CREATININE 2.01* 2.12* 1.61* 1.05* 1.09* 0.95 0.86  CALCIUM 7.5* 7.7* 7.6* 7.7* 7.8* 7.7* 7.7*  PHOS 4.9* 5.1* 4.2 3.0 3.4 3.1 2.9   CBC Recent Labs  Lab 01/02/21 0510 01/03/21 0638 01/04/21 0149 01/06/21 0847 01/06/21 0928 01/06/21 1547  WBC 23.5* 15.4* 15.1* 10.4  --   --   NEUTROABS 19.1* 11.5*  --   --   --   --   HGB 7.3* 8.7* 8.3* 6.3* 6.9* 8.1*  HCT 22.2* 26.3* 24.5* 19.4* 21.0* 25.2*  MCV 94.9 94.9 93.9 98.5  --   --   PLT PLATELET CLUMPS NOTED ON SMEAR, UNABLE TO ESTIMATE PLATELET CLUMPS NOTED ON SMEAR, COUNT APPEARS DECREASED 165 147*  --   --       Medications:     amiodarone  200 mg Oral Q1200   amiodarone  400 mg Oral BID   B-complex with vitamin C  1 tablet Oral Daily   Chlorhexidine Gluconate Cloth  6 each Topical Q0600   clonazepam  0.5 mg Oral TID   [START ON 01/08/2021] docusate sodium  100 mg Oral Daily   heparin  5,000 Units Subcutaneous Q8H   insulin aspart  0-20 Units Subcutaneous Q4H   mouth rinse  15 mL Mouth Rinse  BID   midodrine  10 mg Oral TID WC   oxyCODONE  10 mg Oral Q4H   pantoprazole  40 mg Oral Daily   polyethylene glycol  17 g Oral Daily   QUEtiapine  25 mg Oral QHS   sodium chloride flush  10-40 mL Intracatheter Q12H    Bufford Buttner MD 01/07/2021, 9:34 AM

## 2021-01-07 NOTE — Progress Notes (Signed)
Progress Note  Patient Name: [name] [name] Date of Encounter: 01/08/2021  [facility] Cardiologist: Chilton Si, MD (new)  Subjective   Tired this morning. States she continues to have pain at her debridement site.   Blood pressure remains soft On CRRT; net negative 1.8L Remains in Aflutter with HR 110-120s No plans for OR per surgery; will plan on hydrotherapy  Inpatient Medications    Scheduled Meds:  amiodarone  200 mg Per Tube Q1200   B-complex with vitamin C  1 tablet Per Tube Daily   Chlorhexidine Gluconate Cloth  6 each Topical Q0600   clonazepam  0.5 mg Per Tube TID   docusate  50 mg Per Tube Daily   feeding supplement (PROSource TF)  45 mL Per Tube QID   heparin  5,000 Units Subcutaneous Q8H   insulin aspart  0-20 Units Subcutaneous Q4H   mouth rinse  15 mL Mouth Rinse BID   midodrine  10 mg Per Tube TID WC   oxyCODONE  10 mg Per Tube Q4H   pantoprazole sodium  40 mg Per Tube Daily   polyethylene glycol  17 g Per Tube Daily   QUEtiapine  25 mg Per Tube QHS   sodium chloride flush  10-40 mL Intracatheter Q12H   Continuous Infusions:   prismasol BGK 4/2.5 500 mL/hr at 01/07/21 2337    prismasol BGK 4/2.5 400 mL/hr at 01/07/21 1314   sodium chloride 250 mL (01/07/21 1223)   feeding supplement (VITAL 1.5 CAL) 1,000 mL (01/07/21 1400)   norepinephrine (LEVOPHED) Adult infusion 10 mcg/min (01/04/21 0008)   piperacillin-tazobactam Stopped (01/08/21 0624)   prismasol BGK 4/2.5 1,500 mL/hr at 01/08/21 0558   PRN Meds: fentaNYL (SUBLIMAZE) injection, heparin, heparin, levalbuterol, ondansetron (ZOFRAN) IV, polyethylene glycol, sodium chloride flush   Vital Signs    Vitals:   01/08/21 0400 01/08/21 0500 01/08/21 0600 01/08/21 0700  BP: (!) 104/58 99/77 (!) 110/55 (!) 89/61  Pulse: (!) 119 (!) 120 (!) 120 (!) 123  Resp: (!) 24 (!) 34 (!) 24 20  Temp: 98.3 F (36.8 C)     TempSrc: Oral     SpO2: 95% 95% 97% 96%  Weight: 93.4 kg     Height:         Intake/Output Summary (Last 24 hours) at 01/08/2021 0732 Last data filed at 01/08/2021 0700 Gross per 24 hour  Intake 2095.33 ml  Output 3968 ml  Net -1872.67 ml   Last 3 Weights 01/08/2021 01/07/2021 01/06/2021  Weight (lbs) 205 lb 14.6 oz 218 lb 11.1 oz 219 lb 2.2 oz  Weight (kg) 93.4 kg 99.2 kg 99.4 kg      Telemetry   Aflutter 120s (consistent)- Personally Reviewed  ECG    No new tracing- Personally Reviewed  Physical Exam   VS:  BP (!) 89/61   Pulse (!) 123   Temp 98.3 F (36.8 C) (Oral)   Resp 20   Ht 5\' 2"  (1.575 m)   Wt 93.4 kg   SpO2 96%   BMI 37.66 kg/m  , BMI Body mass index is 37.66 kg/m. GENERAL: Ill appearing, tired but arousable HEENT: pupils reactive to light NECK:  JVD difficult to assess due to body habitus; no carotid bruits LUNGS:  Diminished but clear HEART: Tachycardic.  Regular rhythm. No murmurs ABD:  Obese, soft, ND, NTTP EXT:  Trace-to-1+ LE edema. Warm SKIN:  No rashes no nodules NEURO: Awake and interactive. AAOx3. PSYCH:  Unable to assess   Labs  High Sensitivity Troponin:   Recent Labs  Lab 12/30/20 1023  TROPONINIHS 88*      Chemistry Recent Labs  Lab 01/02/21 0510 01/02/21 1545 01/03/21 4709 01/04/21 0149 01/04/21 1843 01/07/21 0742 01/07/21 1600 01/08/21 0153  NA 129*  130*   < > 132* 130*   < > 134* 134* 132*  K 4.5  4.6   < > 4.3 5.6*   < > 3.9 4.4 4.3  CL 100  100   < > 98 101   < > 101 100 99  CO2 22  23   < > 25 21*   < > 28 27 24   GLUCOSE 194*  194*   < > 69* 208*   < > 94 139* 166*  BUN 23*  23*   < > 20 33*   < > 17 15 15   CREATININE 1.43*  1.44*   < > 1.22* 2.01*   < > 0.86 0.87 0.95  CALCIUM 7.6*  7.5*   < > 7.5* 7.5*   < > 7.7* 8.0* 8.3*  PROT 7.8  --  8.0 7.6  --   --   --   --   ALBUMIN 1.1*  1.1*   < > 1.1* 1.3*   < > 1.3* 1.7* 2.4*  AST 42*  --  38 34  --   --   --   --   ALT 21  --  21 20  --   --   --   --   ALKPHOS 408*  --  389* 374*  --   --   --   --   BILITOT 2.0*  --  1.6*  1.4*  --   --   --   --   GFRNONAA 46*  45*   < > 55* 30*   < > >60 >60 >60  ANIONGAP 7  7   < > 9 8   < > 5 7 9    < > = values in this interval not displayed.     Hematology Recent Labs  Lab 01/06/21 0847 01/06/21 0928 01/06/21 1547 01/07/21 1600 01/08/21 0153  WBC 10.4  --   --  10.2 10.6*  RBC 1.97*  --   --  2.34* 2.39*  HGB 6.3*   < > 8.1* 7.5* 7.6*  HCT 19.4*   < > 25.2* 22.9* 23.4*  MCV 98.5  --   --  97.9 97.9  MCH 32.0  --   --  32.1 31.8  MCHC 32.5  --   --  32.8 32.5  RDW 15.5  --   --  16.6* 16.6*  PLT 147*  --   --  209 253   < > = values in this interval not displayed.    BNP No results for input(s): BNP, PROBNP in the last 168 hours.    DDimer No results for input(s): DDIMER in the last 168 hours.   Radiology    DG Abd Portable 1V  Result Date: 01/07/2021 CLINICAL DATA:  Check gastric catheter EXAM: PORTABLE ABDOMEN - 1 VIEW COMPARISON:  None. FINDINGS: Feeding catheter is noted extending into the distal aspect of the stomach. Scattered large and small bowel gas is noted. IMPRESSION: Feeding catheter within the distal stomach. Electronically Signed   By: 01/09/21 M.D.   On: 01/07/2021 12:22    Cardiac Studies   Echo 01/02/21: 1. Left ventricular ejection fraction, by estimation, is 45 to 50%. The  left  ventricle has mildly decreased function. The left ventricle  demonstrates global hypokinesis. Left ventricular diastolic parameters  were normal.   2. Right ventricular systolic function is normal. The right ventricular  size is normal. There is normal pulmonary artery systolic pressure.   3. The mitral valve is normal in structure. Mild mitral valve  regurgitation. No evidence of mitral stenosis.   4. The aortic valve is normal in structure. Aortic valve regurgitation is  not visualized. No aortic stenosis is present.   5. The inferior vena cava is normal in size with greater than 50%  respiratory variability, suggesting right atrial pressure of 3  mmHg.   Patient Profile     [name] [name] is a 42F with diabetes, hypertension, hyperlipidemia, GERD, and morbid obesity admitted with septic shock secondary to cellulitis and left buttock abscess.  Her hospitalization has been complicated by acute oliguric renal failure now on RRT and encephalopathy. Cardiology was consulted for atrial flutter with RVR and prolonged QTc.  Assessment & Plan    # Atrial flutter/Fib New this admission in the setting of critical illness and sepsis.  She has been on amiodarone and metoprolol, however, several doses of metop held due to hypotension. Currently off levophed. Current plan for 3 days of amiodarone 400 mg bid then 200 mg daily.  Continue to give metoprolol as BP allows. Per surgery, may not need further debridement unless clinically worsening. Will plan for TEE/DCCV once she can be on anticoagulation.  Would start heparin as soon as she is OK per surgery.   -Continue amiodarone 200mg  daily -Will add metop as able as blood pressure allows -Start AC once okay from surgical standpoint--plan on heparin gtt with plans to transition to DOAC eventually -Once able to tolerated uninterrupted AC and more clinically stable, will plan for TEE/DCCV   # Acute systolic and diastolic heart failure:  LVEF this admission.  Likely 2/2 acute illness and tachycardia but given her risk factors she will need an ischemic evaluation once medically stable.  BP too low for GDMT.   -Volume management with CRRT -Unable to tolerate GDMT due to hypotension; will add as able -Once clinical improvement, will need ischemic evaluation  #Septic Shock: #Necrotizing soft tissue infection of the left buttock: S/p debridement with intra-op cultures growing GBS, E. coli, and Prevotella. -Management per surgery, PCCM, ID -Plan for potential hydrotherapy per [facility]  #Acute Respiratory Failure: Occurred in setting of septic shock. Successfully extubated 6/12. -Management per  PCCM  #Oliguric Renal Failure: On CRRT. -Management per renal and PCCM  Cardiology will sign-off for now. Please do not hesitate to call 8/12 back once patient is more clinically stable and able to tolerate uninterrupted AC. If remains in Aflutter, we can arrange for TEE/DCCV at that time.   For questions or updates, please contact [facility] Please consult [email or web address] for contact info under        Signed, [place], MD  01/08/2021, 7:32 AM

## 2021-01-07 NOTE — Progress Notes (Addendum)
Nutrition Follow-up  DOCUMENTATION CODES:   Not applicable  INTERVENTION:   Resume tube feeding via Cortrak tube: Vital 1.5 at 60 ml/h Prosource TF 45 ml QID Provides 2320 kcal, 141 gm protein, 1100 ml free water daily.  Continue B-complex with vitamin C while on CRRT.  NUTRITION DIAGNOSIS:   Increased nutrient needs related to wound healing, acute illness (L buttock abscess; CRRT) as evidenced by estimated needs.  Ongoing  GOAL:   Patient will meet greater than or equal to 90% of their needs   Progressing  MONITOR:   Vent status, TF tolerance, Labs, Skin  REASON FOR ASSESSMENT:   Ventilator, Consult Enteral/tube feeding initiation and management  ASSESSMENT:   50 yo female admitted from [place] with worsening renal function, necrotizing soft tissue infection of left buttock. PMH includes Bartholin cyst, HTN, DM-2, GERD.   Discussed patient in ICU rounds and with RN today. S/P repeat I&D of L buttock 6/12.  Plans to start hydrotherapy with PT. Extubated 6/12. TF off since extubation. Cortrak placed today.Tip is gastric.  CRRT ongoing with plans to stop in the next 24 hours.   Labs reviewed. Na 134 CBG: 80-80-106  Medications reviewed and include B-complex with vitamin C, Novolog.  Weight 99.2 kg today. I/O -3 L since admission UOP 630 ml x 24 hours   Diet Order:   Diet Order             Diet NPO time specified  Diet effective now                   EDUCATION NEEDS:   Not appropriate for education at this time  Skin:  Skin Assessment: Skin Integrity Issues: Skin Integrity Issues:: Other (Comment) Other: L buttock soft tissue infection, S/P debridement  Last BM:  6/13 type 7  Height:   Ht Readings from Last 1 Encounters:  01/01/21 5\' 2"  (1.575 m)    Weight:   Wt Readings from Last 1 Encounters:  01/07/21 99.2 kg    Ideal Body Weight:  50 kg  BMI:  Body mass index is 40 kg/m.  Estimated Nutritional Needs:   Kcal:   2100-2300  Protein:  130-150 gm  Fluid:  >/= 2 L    01/09/21, RD, LDN, CNSC Please refer to Amion for contact information.

## 2021-01-07 NOTE — Progress Notes (Signed)
Progress Note  Patient Name: [name] [name] Date of Encounter: 01/07/2021  [facility] Cardiologist: Chilton Si, MD (new)  Subjective   Extubated successfully yesterday; awake and alert. Remains on CRRT. Off levophed this AM.   Inpatient Medications    Scheduled Meds:  amiodarone  200 mg Oral Q1200   amiodarone  400 mg Oral BID   B-complex with vitamin C  1 tablet Oral Daily   Chlorhexidine Gluconate Cloth  6 each Topical Q0600   clonazepam  1 mg Oral TID   docusate sodium  100 mg Oral BID   heparin  5,000 Units Subcutaneous Q8H   insulin aspart  0-20 Units Subcutaneous Q4H   insulin aspart  3 Units Subcutaneous Q4H   insulin detemir  14 Units Subcutaneous BID   oxyCODONE  10 mg Oral Q4H   pantoprazole  40 mg Oral Daily   polyethylene glycol  17 g Oral Daily   QUEtiapine  25 mg Oral QHS   sodium chloride flush  10-40 mL Intracatheter Q12H   Continuous Infusions:  sodium chloride 10 mL/hr at 01/07/21 0500   dexmedetomidine (PRECEDEX) IV infusion Stopped (01/06/21 1520)   dextrose 30 mL/hr at 01/07/21 0600   norepinephrine (LEVOPHED) Adult infusion 10 mcg/min (01/04/21 0008)   piperacillin-tazobactam Stopped (01/07/21 0554)   prismasol BGK 2/2.5 replacement solution 500 mL/hr at 01/07/21 0308   prismasol BGK 2/2.5 replacement solution 300 mL/hr at 01/06/21 1707   prismasol BGK 4/2.5 1,500 mL/hr at 01/07/21 0617   PRN Meds: fentaNYL (SUBLIMAZE) injection, heparin, heparin, levalbuterol, midazolam, ondansetron (ZOFRAN) IV, polyethylene glycol, sodium chloride flush   Vital Signs    Vitals:   01/07/21 0459 01/07/21 0500 01/07/21 0600 01/07/21 0700  BP:  96/60 (!) 87/59 90/69  Pulse:  (!) 118 (!) 112 (!) 109  Resp:  19 13 10   Temp:  97.8 F (36.6 C)    TempSrc:  Oral    SpO2:  100% 100% 100%  Weight: 99.2 kg     Height:        Intake/Output Summary (Last 24 hours) at 01/07/2021 0716 Last data filed at 01/07/2021 0700 Gross per 24 hour  Intake 1456.59  ml  Output 1825 ml  Net -368.41 ml    Last 3 Weights 01/07/2021 01/06/2021 01/05/2021  Weight (lbs) 218 lb 11.1 oz 219 lb 2.2 oz 219 lb 5.7 oz  Weight (kg) 99.2 kg 99.4 kg 99.5 kg      Telemetry    Aflutter with HR 110-120s- Personally Reviewed  ECG    Aflutter with HR 120 on 01/06/21- Personally Reviewed  Physical Exam   VS:  BP 90/69   Pulse (!) 109   Temp 97.8 F (36.6 C) (Oral)   Resp 10   Ht 5\' 2"  (1.575 m)   Wt 99.2 kg   SpO2 100%   BMI 40.00 kg/m  , BMI Body mass index is 40 kg/m. GENERAL: Critically ill appearing, extubated, alert and interactive HEENT: pupils reactive to light; ET tube in place NECK:  JVD difficult to assess due to body habitus; no carotid bruits LUNGS:  Diminished HEART: Tachycardic.  Regular rhythm. No murmurs ABD:  Soft, ND EXT:  Trace-to-1+ LE edema. Warm SKIN:  No rashes no nodules NEURO: Responsive to stimuli PSYCH:  Unable to assess   Labs    High Sensitivity Troponin:   Recent Labs  Lab 12/30/20 1023  TROPONINIHS 88*       Chemistry Recent Labs  Lab 01/02/21 0510 01/02/21 1545  01/03/21 1610 01/04/21 0149 01/04/21 1843 01/06/21 0847 01/06/21 1547 01/07/21 0030  NA 129*  130*   < > 132* 130*   < > 134* 134* 135  K 4.5  4.6   < > 4.3 5.6*   < > 4.2 4.5 4.0  CL 100  100   < > 98 101   < > 102 103 101  CO2 22  23   < > 25 21*   < > 28 23 25   GLUCOSE 194*  194*   < > 69* 208*   < > 190* 189* 132*  BUN 23*  23*   < > 20 33*   < > 22* 23* 19  CREATININE 1.43*  1.44*   < > 1.22* 2.01*   < > 1.05* 1.09* 0.95  CALCIUM 7.6*  7.5*   < > 7.5* 7.5*   < > 7.7* 7.8* 7.7*  PROT 7.8  --  8.0 7.6  --   --   --   --   ALBUMIN 1.1*  1.1*   < > 1.1* 1.3*   < > 1.2* 1.3* 1.4*  AST 42*  --  38 34  --   --   --   --   ALT 21  --  21 20  --   --   --   --   ALKPHOS 408*  --  389* 374*  --   --   --   --   BILITOT 2.0*  --  1.6* 1.4*  --   --   --   --   GFRNONAA 46*  45*   < > 55* 30*   < > >60 >60 >60  ANIONGAP 7  7   < > 9  8   < > 4* 8 9   < > = values in this interval not displayed.      Hematology Recent Labs  Lab 01/03/21 03/05/21 01/04/21 0149 01/06/21 0847 01/06/21 0928 01/06/21 1547  WBC 15.4* 15.1* 10.4  --   --   RBC 2.77* 2.61* 1.97*  --   --   HGB 8.7* 8.3* 6.3* 6.9* 8.1*  HCT 26.3* 24.5* 19.4* 21.0* 25.2*  MCV 94.9 93.9 98.5  --   --   MCH 31.4 31.8 32.0  --   --   MCHC 33.1 33.9 32.5  --   --   RDW 14.7 14.7 15.5  --   --   PLT PLATELET CLUMPS NOTED ON SMEAR, COUNT APPEARS DECREASED 165 147*  --   --      BNP No results for input(s): BNP, PROBNP in the last 168 hours.    DDimer No results for input(s): DDIMER in the last 168 hours.   Radiology    No results found.  Cardiac Studies   Echo 01/02/21: 1. Left ventricular ejection fraction, by estimation, is 45 to 50%. The  left ventricle has mildly decreased function. The left ventricle  demonstrates global hypokinesis. Left ventricular diastolic parameters  were normal.   2. Right ventricular systolic function is normal. The right ventricular  size is normal. There is normal pulmonary artery systolic pressure.   3. The mitral valve is normal in structure. Mild mitral valve  regurgitation. No evidence of mitral stenosis.   4. The aortic valve is normal in structure. Aortic valve regurgitation is  not visualized. No aortic stenosis is present.   5. The inferior vena cava is normal in size with greater than 50%  respiratory variability, suggesting right atrial pressure of 3 mmHg.   Patient Profile     [name] [name] is a 6F with diabetes, hypertension, hyperlipidemia, GERD, and morbid obesity admitted with septic shock secondary to cellulitis and left buttock abscess.  Her hospitalization has been complicated by acute oliguric renal failure now on RRT and encephalopathy. Cardiology was consulted for atrial flutter with RVR and prolonged QTc.  Assessment & Plan    # Atrial flutter/Fib New this admission in the setting of critical  illness and sepsis.  She has been on amiodarone and metoprolol, however, several doses of metop held due to hypotension. Currently off levophed. Current plan for 3 days of amiodarone 400 mg bid then 200 mg daily.  Continue to give metoprolol as BP allows. Per surgery, may not need further debridement unless clinically worsening. Will plan for TEE/DCCV once she can be on anticoagulation.  Would start heparin as soon as she is OK per surgery.   -Transition to amiodarone 200mg  daily today  -Will add metop as able as blood pressure allows -Start AC once okay from surgical standpoint--plan on heparin gtt with plans to transition to DOAC eventually -Once able to tolerated uninterrupted AC and more clinically stable, will plan for TEE/DCCV   # Acute systolic and diastolic heart failure:  LVEF this admission.  Likely 2/2 acute illness and tachycardia but given her risk factors she will need an ischemic evaluation once medically stable.  BP too low for GDMT.   -Volume management with CRRT -Unable to tolerate GDMT due to hypotension; will add as able -Once clinical improvement, will need ischemic evaluation  #Septic Shock: #Necrotizing soft tissue infection of the left buttock: S/p debridement with intra-op cultures growing GBS, E. coli, and Prevotella. -Management per surgery, PCCM, ID -Plan for potential hydrotherapy per [facility]  #Acute Respiratory Failure: Occurred in setting of septic shock. Successfully extubated 6/12. -Management per PCCM  #Oliguric Renal Failure: On CRRT. -Management per renal and PCCM  For questions or updates, please contact [facility] Please consult [email or web address] for contact info under        Signed, 8/12, MD  01/07/2021, 7:16 AM

## 2021-01-07 NOTE — Progress Notes (Signed)
[facility]  Date of Admission:  12/28/2020           Reason for visit: Follow up on necrotizing soft tissue infection  Current antibiotics: Pip-tazo 6/6--present     Previous antibiotics: Meropenem 6/3--6/6 Linezolid 6/3--6/8  ASSESSMENT:    Polymicrobial necrotizing skin and soft tissue infection involving the left buttock/posterior thigh: Status post multiple debridements (6/3, 6/6, 6/9).  Intraoperative cultures grew E. coli, GBS, and Prevotella.  She remains on piperacillin tazobactam and has been afebrile over the last 24 hours with T-max 99 F.  Currently no further debridement has been recommended by general surgery and they have ordered PT hydrotherapy. Septic shock: Secondary to #1 with improved hemodynamics and currently off vasopressors. Leukocytosis: Secondary #1 and normalized this morning. Fevers: She developed new onset fevers prior to the weekend but has since defervesced.  Chest x-ray revealed small right pleural effusion but she was able to be extubated over the weekend nonetheless.  Her urinalysis was not completely sterile, but no cultures were sent and should be treated with current antibiotics.  Blood cultures were obtained and are currently no growth to date. AKI: Currently requiring CRRT. Ventilator dependent respiratory failure: Extubated over the weekend Diabetes, obesity Atrial flutter with RVR: Cardiology following   PLAN:    Continue piperacillin tazobactam Wound care per surgery Follow fever curve.  If she were to continue to spike fevers her HD line may need to be replaced or could consider diagnostic/therapeutic thoracentesis Follow-up blood cultures Glycemic control Rest of care per primary team and other specialists   Principal Problem:   Necrotizing soft tissue infection Active Problems:   Septic shock (HCC)   Sepsis due to Streptococcus, group B (HCC)   Acute kidney injury (HCC)   Obesity   Type 2  diabetes mellitus with hyperglycemia (HCC)   Acute hypercapnic respiratory failure (HCC)   Typical atrial flutter (HCC)   Hyperkalemia   New onset a-fib (HCC)   Acute combined systolic and diastolic heart failure (HCC)   Acute metabolic encephalopathy   History of ETT   Encounter for central line placement   Encounter for orogastric (OG) tube placement    MEDICATIONS:    Scheduled Meds: . amiodarone  200 mg Oral Q1200  . B-complex with vitamin C  1 tablet Oral Daily  . Chlorhexidine Gluconate Cloth  6 each Topical Q0600  . clonazepam  0.5 mg Oral TID  . [START ON 01/08/2021] docusate sodium  100 mg Oral Daily  . heparin  5,000 Units Subcutaneous Q8H  . insulin aspart  0-20 Units Subcutaneous Q4H  . mouth rinse  15 mL Mouth Rinse BID  . midodrine  10 mg Oral TID WC  . oxyCODONE  10 mg Oral Q4H  . pantoprazole  40 mg Oral Daily  . polyethylene glycol  17 g Oral Daily  . QUEtiapine  25 mg Oral QHS  . sodium chloride flush  10-40 mL Intracatheter Q12H   Continuous Infusions: . sodium chloride 10 mL/hr at 01/07/21 0500  . albumin human 25 g (01/07/21 0849)  . dexmedetomidine (PRECEDEX) IV infusion Stopped (01/06/21 1520)  . norepinephrine (LEVOPHED) Adult infusion 10 mcg/min (01/04/21 0008)  . piperacillin-tazobactam Stopped (01/07/21 0554)  . prismasol BGK 2/2.5 replacement solution 500 mL/hr at 01/07/21 0308  . prismasol BGK 2/2.5 replacement solution 300 mL/hr at 01/06/21 1707  . prismasol BGK 4/2.5 1,500 mL/hr at 01/07/21 0859   PRN Meds:.fentaNYL (SUBLIMAZE) injection, heparin, heparin,  levalbuterol, midazolam, ondansetron (ZOFRAN) IV, polyethylene glycol, sodium chloride flush  SUBJECTIVE:   24 hour events:  No acute events noted overnight Afebrile WBC improved Extubated over the weekend    Review of Systems  Unable to perform ROS: Acuity of condition     OBJECTIVE:   Blood pressure (!) 88/59, pulse (!) 104, temperature 97.6 F (36.4 C), temperature source  Oral, resp. rate (!) 21, height 5\' 2"  (1.575 m), weight 99.2 kg, SpO2 97 %. Body mass index is 40 kg/m.  Physical Exam Constitutional:      Comments: Lethargic, opens her eyes to voice, follows simple commands, but does not otherwise interact  HENT:     Head: Normocephalic and atraumatic.  Neck:     Comments: Right IJ HD line Cardiovascular:     Rate and Rhythm: Tachycardia present.  Pulmonary:     Effort: Pulmonary effort is normal.     Comments: Diminished at the bases Abdominal:     General: There is no distension.     Palpations: Abdomen is soft.     Tenderness: There is no abdominal tenderness.  Genitourinary:    Comments: Foley in place Rectal tube in place Musculoskeletal:     Cervical back: Normal range of motion and neck supple.     Right lower leg: Edema present.     Left lower leg: Edema present.  Skin:    General: Skin is warm and dry.     Findings: No rash.  Neurological:     General: No focal deficit present.     Lab Results: Lab Results  Component Value Date   WBC 10.4 01/06/2021   HGB 8.1 (L) 01/06/2021   HCT 25.2 (L) 01/06/2021   MCV 98.5 01/06/2021   PLT 147 (L) 01/06/2021    Lab Results  Component Value Date   NA 134 (L) 01/07/2021   K 3.9 01/07/2021   CO2 28 01/07/2021   GLUCOSE 94 01/07/2021   BUN 17 01/07/2021   CREATININE 0.86 01/07/2021   CALCIUM 7.7 (L) 01/07/2021   GFRNONAA >60 01/07/2021    Lab Results  Component Value Date   ALT 20 01/04/2021   AST 34 01/04/2021   ALKPHOS 374 (H) 01/04/2021   BILITOT 1.4 (H) 01/04/2021    No results found for: CRP  No results found for: ESRSEDRATE   I have reviewed the micro and lab results in Epic.  Imaging: No results found.   Imaging independently reviewed in Epic.    03/06/2021 for Infectious Disease [facility] [phone] pager 01/07/2021, 10:18 AM

## 2021-01-08 ENCOUNTER — Inpatient Hospital Stay (HOSPITAL_COMMUNITY): Payer: Medicaid Other

## 2021-01-08 DIAGNOSIS — Z452 Encounter for adjustment and management of vascular access device: Secondary | ICD-10-CM

## 2021-01-08 DIAGNOSIS — R509 Fever, unspecified: Secondary | ICD-10-CM | POA: Diagnosis not present

## 2021-01-08 DIAGNOSIS — N179 Acute kidney failure, unspecified: Secondary | ICD-10-CM | POA: Diagnosis not present

## 2021-01-08 DIAGNOSIS — E1165 Type 2 diabetes mellitus with hyperglycemia: Secondary | ICD-10-CM | POA: Diagnosis not present

## 2021-01-08 LAB — RENAL FUNCTION PANEL
Albumin: 2.4 g/dL — ABNORMAL LOW (ref 3.5–5.0)
Anion gap: 9 (ref 5–15)
BUN: 15 mg/dL (ref 6–20)
CO2: 24 mmol/L (ref 22–32)
Calcium: 8.3 mg/dL — ABNORMAL LOW (ref 8.9–10.3)
Chloride: 99 mmol/L (ref 98–111)
Creatinine, Ser: 0.95 mg/dL (ref 0.44–1.00)
GFR, Estimated: >60 mL/min (ref >60)
Glucose, Bld: 166 mg/dL — ABNORMAL HIGH (ref 70–99)
Phosphorus: 2.3 mg/dL — ABNORMAL LOW (ref 2.5–4.6)
Potassium: 4.3 mmol/L (ref 3.5–5.1)
Sodium: 132 mmol/L — ABNORMAL LOW (ref 135–145)

## 2021-01-08 LAB — CBC
HCT: 23.4 % — ABNORMAL LOW (ref 36.0–46.0)
Hemoglobin: 7.6 g/dL — ABNORMAL LOW (ref 12.0–15.0)
MCH: 31.8 pg (ref 26.0–34.0)
MCHC: 32.5 g/dL (ref 30.0–36.0)
MCV: 97.9 fL (ref 80.0–100.0)
Platelets: 253 10*3/uL (ref 150–400)
RBC: 2.39 MIL/uL — ABNORMAL LOW (ref 3.87–5.11)
RDW: 16.6 % — ABNORMAL HIGH (ref 11.5–15.5)
WBC: 10.6 10*3/uL — ABNORMAL HIGH (ref 4.0–10.5)
nRBC: 0 % (ref 0.0–0.2)

## 2021-01-08 LAB — GLUCOSE, CAPILLARY
Glucose-Capillary: 205 mg/dL — ABNORMAL HIGH (ref 70–99)
Glucose-Capillary: 196 mg/dL — ABNORMAL HIGH (ref 70–99)
Glucose-Capillary: 264 mg/dL — ABNORMAL HIGH (ref 70–99)
Glucose-Capillary: 202 mg/dL — ABNORMAL HIGH (ref 70–99)
Glucose-Capillary: 184 mg/dL — ABNORMAL HIGH (ref 70–99)
Glucose-Capillary: 217 mg/dL — ABNORMAL HIGH (ref 70–99)

## 2021-01-08 LAB — HEPARIN LEVEL (UNFRACTIONATED): Heparin Unfractionated: <0.1 IU/mL — ABNORMAL LOW (ref 0.30–0.70)

## 2021-01-08 LAB — MAGNESIUM: Magnesium: 2.3 mg/dL (ref 1.7–2.4)

## 2021-01-08 MED ORDER — HEPARIN (PORCINE) 25000 UT/250ML-% IV SOLN
1400.0000 [IU]/h | INTRAVENOUS | Status: DC
  Administered 2021-01-08: 1000 [IU]/h via INTRAVENOUS
  Filled 2021-01-08: qty 250

## 2021-01-08 MED ORDER — CLONAZEPAM 0.5 MG PO TBDP
0.5000 mg | ORAL_TABLET | Freq: Two times a day (BID) | ORAL | Status: DC
  Administered 2021-01-08 (×2): 0.5 mg
  Filled 2021-01-08 (×2): qty 1

## 2021-01-08 MED ORDER — FENTANYL CITRATE (PF) 100 MCG/2ML IJ SOLN
100.0000 ug | Freq: Once | INTRAMUSCULAR | Status: AC
  Administered 2021-01-08: 100 ug via INTRAVENOUS
  Filled 2021-01-08: qty 2

## 2021-01-08 MED ORDER — LORAZEPAM 2 MG/ML IJ SOLN
1.0000 mg | INTRAMUSCULAR | Status: DC | PRN
  Administered 2021-01-08 – 2021-01-13 (×9): 1 mg via INTRAVENOUS
  Filled 2021-01-08 (×11): qty 1

## 2021-01-08 MED ORDER — OXYCODONE HCL 5 MG/5ML PO SOLN
10.0000 mg | Freq: Four times a day (QID) | ORAL | Status: DC
  Administered 2021-01-08 – 2021-01-10 (×8): 10 mg
  Filled 2021-01-08 (×8): qty 10

## 2021-01-08 MED ORDER — RENA-VITE PO TABS
1.0000 | ORAL_TABLET | Freq: Every day | ORAL | Status: DC
  Administered 2021-01-08 – 2021-01-09 (×2): 1
  Filled 2021-01-08 (×2): qty 1

## 2021-01-08 MED ORDER — FENTANYL CITRATE (PF) 100 MCG/2ML IJ SOLN
100.0000 ug | INTRAMUSCULAR | Status: DC | PRN
  Administered 2021-01-08: 100 ug via INTRAVENOUS
  Filled 2021-01-08: qty 2

## 2021-01-08 MED ORDER — PIPERACILLIN-TAZOBACTAM 3.375 G IVPB
3.3750 g | Freq: Three times a day (TID) | INTRAVENOUS | Status: DC
  Administered 2021-01-08 – 2021-01-09 (×3): 3.375 g via INTRAVENOUS
  Filled 2021-01-08 (×4): qty 50

## 2021-01-08 MED ORDER — HEPARIN SODIUM (PORCINE) 1000 UNIT/ML DIALYSIS
1000.0000 [IU] | INTRAMUSCULAR | Status: DC | PRN
  Administered 2021-01-08: 2400 [IU] via INTRAVENOUS_CENTRAL
  Administered 2021-01-08: 1200 [IU] via INTRAVENOUS_CENTRAL
  Filled 2021-01-08 (×3): qty 6

## 2021-01-08 MED ORDER — FOOD THICKENER (SIMPLYTHICK)
10.0000 | ORAL | Status: DC | PRN
  Filled 2021-01-08: qty 10

## 2021-01-08 MED ORDER — INSULIN ASPART 100 UNIT/ML IJ SOLN
3.0000 [IU] | INTRAMUSCULAR | Status: DC
  Administered 2021-01-08 – 2021-01-12 (×25): 3 [IU] via SUBCUTANEOUS

## 2021-01-08 MED ORDER — LORAZEPAM 2 MG/ML IJ SOLN
1.0000 mg | Freq: Once | INTRAMUSCULAR | Status: AC
  Administered 2021-01-08: 1 mg via INTRAVENOUS
  Filled 2021-01-08: qty 1

## 2021-01-08 NOTE — Evaluation (Signed)
Physical Therapy Wound Evaluation and Treatment Patient Details  Name: [name] [name] MRN: [id number] Date of Birth: [birth year]  Today's Date: 01/08/2021 Time: 0814-4818 Time Calculation (min): 38 min  Subjective  Subjective Assessment Subjective: Obtunded throughout session until the very end when she was rousing and talking with therapist. Patient and Family Stated Goals: None stated Date of Onset:  (Unsure) Prior Treatments: I&D 6/9  Pain Score:  Pt appeared to be uncomfortable by end of session, despite pain meds given by RN prior to session beginning.   Wound Assessment  Wound / Incision (Open or Dehisced) 12/28/20 Buttocks Left cyst- reddened, swollen area with brown drainage coming from left lower buttocks (Active)  Dressing Type ABD;Barrier Film (skin prep);Gauze (Comment);Moist to moist 01/08/21 1300  Dressing Changed Changed 01/08/21 1300  Dressing Status Clean;Dry;Intact 01/08/21 1300  Dressing Change Frequency Daily 01/08/21 1300  Site / Wound Assessment Granulation tissue;Black;Yellow 01/08/21 1300  % Wound base Red or Granulating 50% 01/08/21 1300  % Wound base Yellow/Fibrinous Exudate 35% 01/08/21 1300  % Wound base Black/Eschar 15% 01/08/21 1300  % Wound base Other/Granulation Tissue (Comment) 0% 01/08/21 1300  Peri-wound Assessment Intact 01/08/21 1300  Wound Length (cm) 20 cm 01/08/21 1300  Wound Width (cm) 20 cm 01/08/21 1300  Wound Depth (cm) 4.8 cm 01/08/21 1300  Wound Volume (cm^3) 1920 cm^3 01/08/21 1300  Wound Surface Area (cm^2) 400 cm^2 01/08/21 1300  Tunneling (cm) 9 cm tunnel at 6:00 01/08/21 1300  Undermining (cm) up to 3 cm from 9:00 to 12:00 01/08/21 1300  Margins Unattached edges (unapproximated) 01/08/21 1300  Closure None 01/08/21 1300  Drainage Amount Moderate 01/08/21 1300  Drainage Description Serosanguineous 01/08/21 1300  Non-staged Wound Description Full thickness 01/08/21 1300  Treatment Debridement (Selective);Hydrotherapy (Pulse  lavage);Packing (Saline gauze) 01/08/21 1300   Hydrotherapy Pulsed lavage therapy - wound location: L buttock Pulsed Lavage with Suction (psi): 12 psi Pulsed Lavage with Suction - Normal Saline Used: 1000 mL Pulsed Lavage Tip: Tip with splash shield Selective Debridement Selective Debridement - Location: L buttock Selective Debridement - Tools Used: Forceps, Scissors Selective Debridement - Tissue Removed: Loose yellow slough and tightly adhered black necrotic tissue    Wound Assessment and Plan  Wound Therapy - Assess/Plan/Recommendations Wound Therapy - Clinical Statement: Pt presents to hydrotherapy s/p I&D on 01/03/2021. A majority of the tissue present is loose yellow slough. Anticipate the wound will clean up nicely as hydro sessions progress. Will continue to follow for selective removal of unviable tissue, to decrease bioburden and promote wound bed healing. Wound Therapy - Functional Problem List: Decreased tolerance for OOB to chair and position changes due to pain and decreased skin integrity. Factors Delaying/Impairing Wound Healing: Infection - systemic/local, Incontinence Hydrotherapy Plan: Debridement, Dressing change, Patient/family education, Pulsatile lavage with suction Wound Therapy - Frequency: 6X / week Wound Therapy - Follow Up Recommendations: dressing changes by RN  Wound Therapy Goals- Improve the function of patient's integumentary system by progressing the wound(s) through the phases of wound healing (inflammation - proliferation - remodeling) by: Wound Therapy Goals - Improve the function of patient's integumentary system by progressing the wound(s) through the phases of wound healing by: Decrease Necrotic Tissue to: 0% Decrease Necrotic Tissue - Progress: Goal set today Increase Granulation Tissue to: 100% Increase Granulation Tissue - Progress: Goal set today Goals/treatment plan/discharge plan were made with and agreed upon by patient/family: Yes Time For  Goal Achievement: 7 days Wound Therapy - Potential for Goals: Good  Goals will be updated until maximal  potential achieved or discharge criteria met.  Discharge criteria: when goals achieved, discharge from hospital, MD decision/surgical intervention, no progress towards goals, refusal/missing three consecutive treatments without notification or medical reason.  GP     Charges PT Wound Care Charges $Wound Debridement up to 20 cm: < or equal to 20 cm $ Wound Debridement each add'l 20 sqcm: 9 $PT PLS Gun and Tip: 1 Supply $PT Hydrotherapy Visit: 1 Visit       Thelma Comp 01/08/2021, 2:44 PM  Rolinda Roan, PT, DPT Acute Rehabilitation Services Pager: [phone] Office: [phone]

## 2021-01-08 NOTE — Progress Notes (Signed)
ANTICOAGULATION CONSULT NOTE - Initial Consult  Pharmacy Consult for Heparin Indication: atrial fibrillation/flutter  No Known Allergies  Patient Measurements: Height: 5\' 2"  (157.5 cm) Weight: 93.4 kg (205 lb 14.6 oz) IBW/kg (Calculated) : 50.1 Heparin Dosing Weight: 71.9 kg  Vital Signs: Temp: 99.3 F (37.4 C) (06/14 0743) Temp Source: Axillary (06/14 0743) BP: 89/61 (06/14 0700) Pulse Rate: 118 (06/14 0755)  Labs: Recent Labs    01/06/21 0847 01/06/21 0928 01/06/21 1547 01/07/21 0030 01/07/21 0742 01/07/21 1600 01/08/21 0153  HGB 6.3*   < > 8.1*  --   --  7.5* 7.6*  HCT 19.4*   < > 25.2*  --   --  22.9* 23.4*  PLT 147*  --   --   --   --  209 253  CREATININE 1.05*  --  1.09*   < > 0.86 0.87 0.95   < > = values in this interval not displayed.    Estimated Creatinine Clearance: 77.9 mL/min (by C-G formula based on SCr of 0.95 mg/dL).   Medical History: Past Medical History:  Diagnosis Date   Depression    DM (diabetes mellitus) (HCC)    GERD (gastroesophageal reflux disease)    HLD (hyperlipidemia)    Obesity    Tobacco abuse     Assessment: 50 yo female with wound infection s/p multiple I&Ds and started on CRRT. Had new atrial fibrillation/flutter this admission and started on IV amiodarone which has since been switched to PO amiodarone. Was not started on systemic AC given I&Ds in the OR, last I&D 6/9 with no future procedures planned. Was on subcutaneous heparin for DVT PPX, last dose 6/14 at ~0530. Pharmacy asked to switch to IV heparin.  Remains in atrial flutter this morning. CRRT stopped this AM. Hgb low but stable 7.6, transfused 1u PRBC most recently on 6/12. Pltc improved 253. No overt bleeding noted.   Goal of Therapy:  Heparin level 0.3-0.7 units/ml Monitor platelets by anticoagulation protocol: Yes   Plan:  Start heparin infusion at 1000 units/hr Check 6-8 hr HL Monitor daily HL, CBC, s/sx bleeding  8/12, PharmD, BCPS PGY2  Cardiology Pharmacy Resident Phone: [phone] 01/08/2021  10:02 AM  Please check [email or web address] for unit-specific pharmacy phone numbers.

## 2021-01-08 NOTE — Progress Notes (Signed)
NAME:  Soumya Colson, MRN:  [id number], DOB:  09-Jul-1971, LOS: 11 ADMISSION DATE:  12/28/2020, CONSULTATION DATE:  6/3 REFERRING MD:  [facility] MD, CHIEF COMPLAINT:  Fever, pain and ulceration  History of Present Illness:  50 y/o female initially admitted to [facility] on 5/29 complaining of fever and a buttock abscess, moved to [facility] on 6/3 in the setting of worsening renal failure, acidosis, encephaloapthy with known group B strep wound culture.  Pertinent  Medical History  Recent Vulvar irritation and Bartholin Cyst seen at [facility]  HTN DM type II Cholecystectomy  GERD Anxiety  Significant Hospital Events:  5/29 admitted to [facility] sepsis felt 2/2 cellulitis on buttocks. Cultures sent. CT abd/pelvis + Jansen gas but no drainable abscess. Started on Vanc. IVFs. Wound also cultured.  6/1 wound culture back + group B strep. 1 of 4 blood cultures + GPC. ABX changed to rocephin 6/3 Arrived at [facility] from [place] w/ new hypotension, worsening renal failure, worsening leukocytosis, poorly controlled pain and new encephalopathy. Admitted to ICU. Started on zyvox and meropenem. Surgical consult requested> to OR for debridement 6/3 started CRRT, meropenem and Linezolid added 6/4 IVIg >6/6 6/5 Meropenem stopped 6/6 zosyn added  6/6 back to OR for debridement, in and out of atrial flutter; QTc 590; cardiology consult> amiodarone 6/7 off vasopressors, continues to have atrial flutter, started ketamine, Linezolid stopped 6/8 started on metoprolol, echo with LVEF 45-50%.  6/9 OR debridement  6/11 Fever curve improved, remains on CRRT  6/12 extubated 6/13 remains on CRRT  Interim History / Subjective:  Afebrile Remains on CRRT Still intermittently confused, weak Off pressors  Objective   Blood pressure (!) 89/61, pulse (!) 123, temperature 98.3 F (36.8 C), temperature source Oral, resp. rate 20, height 5\' 2"  (1.575 m), weight 93.4 kg, SpO2 96 %.        Intake/Output Summary (Last 24  hours) at 01/08/2021 0709 Last data filed at 01/08/2021 0700 Gross per 24 hour  Intake 2095.33 ml  Output 3968 ml  Net -1872.67 ml    Filed Weights   01/06/21 0400 01/07/21 0459 01/08/21 0400  Weight: 99.4 kg 99.2 kg 93.4 kg    Examination: Constitutional: chronically ill appearing woman in NAD  Eyes: pupils equal, reactive, tracking Ears, nose, mouth, and throat: MMM, trachea midline Cardiovascular: tachy, ext warm Respiratory: diminished bases, no accessory muscle use Gastrointestinal: soft, +BS Skin: No rashes, normal turgor, defer surgical site eval to [facility] Neurologic: moves all 4 ext to command, globally weak Psychiatric: RASS -1, intermittent odd responses but Aox3    Labs/imaging that I havepersonally reviewed   Chemistries look good CBC stable  Resolved Hospital Problem list     Assessment & Plan:   Septic shock due to polymicrobial necrotizing buttock infection- improved, ongoing vasoplegia with attempts to pull fluid Septic ATN, hyperkalemia on CRRT Afib, new onset in setting of sepsis ABLA in setting of surgery- last transfused 6/12 Heart failure question Afib/RVR induced- 83kg seems to be dry weight if we believe the May 25 measurement (currently 93kg); do not think this is accurate, think she is pretty close Acute respiratory failure with hypoxemia- improved Baseline narcotic and benzo dependence Metabolic encephalopathy improving DM2 with hyperglycemia  - TF + SSI insulin, long acting can be added PRN - Start heparin drip, watch h/h - Hold CRRT today after discussion with nephrology - 2 weeks zosyn after 6/9 (presumed source control): 6/23 - Lower frequency of benzos and opiates - PRN fentanyl as ordered - PT/OT/SLP  input appreciated - Continue rectal tube - Levophed titrated to MAP 65, continue midodrine  Best practice   Diet:  Tube Feed  Pain/Anxiety/Delirium protocol (if indicated): No VAP protocol (if indicated): No DVT prophylaxis:  Subcutaneous Heparin GI prophylaxis: H2B Glucose control:  SSI Yes Central venous access:  Yes, and it is still needed Arterial line:  out Foley:  out Mobility:  bed rest  PT consulted: Yes Last date of multidisciplinary goals of care discussion will update when they come in Code Status:  full code Disposition: remain in ICU 1 more day, if off pressors tomorrow; can go to progressive   Patient critically ill due to shock, renal failure, necrotizing infection Interventions to address this today midodrine, abx, CRRT titration, AC challenge Risk of deterioration without these interventions is high  I personally spent 33 minutes providing critical care not including any separately billable procedures  Myrla Halsted MD [facility]  Prefer epic messenger for cross cover needs If after hours, please call E-link

## 2021-01-08 NOTE — Evaluation (Signed)
Clinical/Bedside Swallow Evaluation Patient Details  Name: [name] [name] MRN: [id number] Date of Birth: [birth year]  Today's Date: 01/08/2021 Time: SLP Start Time (ACUTE ONLY): 0917 SLP Stop Time (ACUTE ONLY): 0940 SLP Time Calculation (min) (ACUTE ONLY): 23 min  Past Medical History:  Past Medical History:  Diagnosis Date   Depression    DM (diabetes mellitus) (HCC)    GERD (gastroesophageal reflux disease)    HLD (hyperlipidemia)    Obesity    Tobacco abuse    Past Surgical History:  Past Surgical History:  Procedure Laterality Date   ABCESS DRAINAGE  11/2020   bartholin gland cyst   INCISION AND DRAINAGE ABSCESS Left 12/31/2020   Procedure: INCISION AND DRAINAGE ABSCESS BUTTOCK;  Surgeon: Axel Filler, MD;  Location: [facility] OR;  Service: General;  Laterality: Left;   IRRIGATION AND DEBRIDEMENT BUTTOCKS Left 12/28/2020   Procedure: DEBRIDEMENT LEFT BUTTOCK NECROTIZING SOFT TISSUE INFECTION - TOTAL TISSUE VOLUME 14CM X 12CM X 3CM DEEP;  Surgeon: Violeta Gelinas, MD;  Location: [facility] OR;  Service: General;  Laterality: Left;   WOUND DEBRIDEMENT Left 01/03/2021   Procedure: IRRIGATION AND DEBRIDEMENT LEFT BUTTOCK;  Surgeon: Axel Filler, MD;  Location: [facility] OR;  Service: General;  Laterality: Left;   HPI:  50 y/o female initially admitted to [facility] on 5/29 complaining of fever and a buttock abscess, moved to [facility] on 6/3 in the setting of worsening renal failure, acidosis, encephaloapthy with known group B strep wound culture. ETT 6/4-6/12, hx GERD, obesity.   Assessment / Plan / Recommendation Clinical Impression  Pt alert and able to follow commands this date, eager for PO. Repositioned pt fully upright. Pt with reduced vocal intensity (note 9 day intubation with extubation 6/12).  Pt with delayed cough x2 with thin liquids following 3 oz water challenge concerning for reduced airway protection. Will proceed with instrumental assessment to objectively assess aspiration risk given pt hx  of decreased respiratory status with prolonged intubation and deconditioning. Pt and RN in agreement with plan. MBSS planned this morning. Pt okay for ice chips following oral care. Defer diet initiation until completion of MBSS per findings.  SLP Visit Diagnosis: Dysphagia, unspecified (R13.10)    Aspiration Risk  Mild aspiration risk;Moderate aspiration risk    Diet Recommendation NPO, MBSS this date; pt okay for ice chips following oral care    Medication Administration: Via alternative means    Other  Recommendations Oral Care Recommendations: Oral care QID;Oral care prior to ice chip/H20   Follow up Recommendations Other (comment) (TBD)      Frequency and Duration min 2x/week  2 weeks       Prognosis Prognosis for Safe Diet Advancement: Good Barriers to Reach Goals: Time post onset      Swallow Study   General Date of Onset: 12/23/20 HPI: 50 y/o female initially admitted to [facility] on 5/29 complaining of fever and a buttock abscess, moved to [facility] on 6/3 in the setting of worsening renal failure, acidosis, encephaloapthy with known group B strep wound culture. ETT 6/4-6/12, hx GERD, obesity. Type of Study: Bedside Swallow Evaluation Previous Swallow Assessment: none on file Diet Prior to this Study: NPO Temperature Spikes Noted: No Respiratory Status: Room air History of Recent Intubation: Yes Length of Intubations (days): 9 days Date extubated: 01/06/21 Behavior/Cognition: Cooperative;Alert Oral Cavity Assessment: Within Functional Limits Oral Care Completed by SLP: Yes Oral Cavity - Dentition: Edentulous Vision: Functional for self-feeding Self-Feeding Abilities: Needs assist Patient Positioning: Upright in bed Baseline Vocal Quality: Low vocal intensity  Volitional Cough: Strong Volitional Swallow: Able to elicit    Oral/Motor/Sensory Function Overall Oral Motor/Sensory Function: Within functional limits   Ice Chips Ice chips: Within functional  limits Presentation: Spoon   Thin Liquid Thin Liquid: Impaired Presentation: Cup;Straw Pharyngeal  Phase Impairments: Suspected delayed Swallow;Multiple swallows;Cough - Delayed    Nectar Thick Nectar Thick Liquid: Not tested   Honey Thick Honey Thick Liquid: Not tested   Puree Puree: Within functional limits   Solid     Solid: Impaired Presentation: Self Fed;Spoon Oral Phase Impairments: Impaired mastication Oral Phase Functional Implications: Prolonged oral transit Pharyngeal Phase Impairments: Suspected delayed Swallow;Multiple swallows      Ardyth Gal MA, CCC-SLP Acute Rehabilitation Services   01/08/2021,9:51 AM

## 2021-01-08 NOTE — Progress Notes (Signed)
[facility]  Date of Admission:  12/28/2020           Reason for visit: Follow up on necrotizing soft tissue infection  Current antibiotics: Pip-tazo 6/6--present     Previous antibiotics: Meropenem 6/3--6/6 Linezolid 6/3--6/8  ASSESSMENT:    Polymicrobial necrotizing skin and soft tissue infection: Involving the left buttock/posterior thigh.  Status post multiple debridements 6/3, 6/6, 6/9.  Intraoperative cultures with E. coli, GBS, and Prevotella.  Currently on piperacillin tazobactam with no further debridement planned by general surgery at this time. Fevers: She developed new onset fevers prior to the weekend but has since defervesced.  T-max 99.3.  Chest x-ray at that time revealed a small right pleural effusion but she was able to be extubated nonetheless.  Her urinalysis was not completely sterile Eila.,  But no cultures were sent and should be covered with current antibiotics.  Her blood cultures were obtained and are currently no growth at 4 days. AKI: Followed by nephrology.  Currently planning to stop CRRT and transition to intermittent HD Diabetes, obesity Atrial flutter with RVR: Cardiology following  PLAN:    Continue piperacillin tazobactam Wound care Follow fever curve after stopping CRRT.  If she were to respike fevers her HD line may need to be replaced or could consider diagnostic/therapeutic thoracentesis Follow-up pending blood cultures Glycemic control Rest of care per primary team and other specialists   Principal Problem:   Necrotizing soft tissue infection Active Problems:   Septic shock (HCC)   Sepsis due to Streptococcus, group B (HCC)   Acute kidney injury (HCC)   Obesity   Type 2 diabetes mellitus with hyperglycemia (HCC)   Acute hypercapnic respiratory failure (HCC)   Typical atrial flutter (HCC)   Hyperkalemia   New onset a-fib (HCC)   Acute combined systolic and diastolic heart failure (HCC)   Acute  metabolic encephalopathy   History of ETT   Encounter for central line placement   Encounter for orogastric (OG) tube placement    MEDICATIONS:    Scheduled Meds: . amiodarone  200 mg Per Tube Q1200  . B-complex with vitamin C  1 tablet Per Tube Daily  . Chlorhexidine Gluconate Cloth  6 each Topical Q0600  . clonazepam  0.5 mg Per Tube BID  . docusate  50 mg Per Tube Daily  . feeding supplement (PROSource TF)  45 mL Per Tube QID  . insulin aspart  0-20 Units Subcutaneous Q4H  . insulin aspart  3 Units Subcutaneous Q4H  . mouth rinse  15 mL Mouth Rinse BID  . midodrine  10 mg Per Tube TID WC  . oxyCODONE  10 mg Per Tube Q6H  . pantoprazole sodium  40 mg Per Tube Daily  . QUEtiapine  25 mg Per Tube QHS  . sodium chloride flush  10-40 mL Intracatheter Q12H   Continuous Infusions: . sodium chloride 250 mL (01/07/21 1223)  . feeding supplement (VITAL 1.5 CAL) 1,000 mL (01/08/21 0812)  . heparin 1,000 Units/hr (01/08/21 0941)  . norepinephrine (LEVOPHED) Adult infusion 10 mcg/min (01/04/21 0008)  . piperacillin-tazobactam (ZOSYN)  IV     PRN Meds:.fentaNYL (SUBLIMAZE) injection, heparin, levalbuterol, ondansetron (ZOFRAN) IV, polyethylene glycol, sodium chloride flush  SUBJECTIVE:   24 hour events:  No acute events noted  No new complaints.  She currently denies any fevers or chills.  She denies back pain.  She quickly falls back asleep.  Review of Systems  Constitutional:  Negative for  chills and fever.  Respiratory: Negative.    Cardiovascular: Negative.   Gastrointestinal: Negative.   Musculoskeletal:  Negative for back pain.     OBJECTIVE:   Blood pressure (!) 93/46, pulse (!) 120, temperature 99.3 F (37.4 C), temperature source Axillary, resp. rate 13, height 5\' 2"  (1.575 m), weight 93.4 kg, SpO2 92 %. Body mass index is 37.66 kg/m.  Physical Exam Constitutional:      Comments: Lethargic, lying in bed, no acute distress, able to answer yes or no questions   HENT:     Head: Normocephalic and atraumatic.  Eyes:     Extraocular Movements: Extraocular movements intact.     Conjunctiva/sclera: Conjunctivae normal.  Neck:     Comments: Right IJ nontunneled line Cardiovascular:     Rate and Rhythm: Tachycardia present.     Pulses: Normal pulses.  Pulmonary:     Effort: Pulmonary effort is normal. No respiratory distress.     Comments: Breath sounds diminished at the bases Musculoskeletal:     Right lower leg: Edema present.     Left lower leg: Edema present.  Skin:    General: Skin is warm and dry.     Findings: No rash.  Neurological:     General: No focal deficit present.     Motor: Weakness present.  Psychiatric:        Mood and Affect: Mood normal.        Behavior: Behavior normal.     Lab Results: Lab Results  Component Value Date   WBC 10.6 (H) 01/08/2021   HGB 7.6 (L) 01/08/2021   HCT 23.4 (L) 01/08/2021   MCV 97.9 01/08/2021   PLT 253 01/08/2021    Lab Results  Component Value Date   NA 132 (L) 01/08/2021   K 4.3 01/08/2021   CO2 24 01/08/2021   GLUCOSE 166 (H) 01/08/2021   BUN 15 01/08/2021   CREATININE 0.95 01/08/2021   CALCIUM 8.3 (L) 01/08/2021   GFRNONAA >60 01/08/2021    Lab Results  Component Value Date   ALT 20 01/04/2021   AST 34 01/04/2021   ALKPHOS 374 (H) 01/04/2021   BILITOT 1.4 (H) 01/04/2021    No results found for: CRP  No results found for: ESRSEDRATE   I have reviewed the micro and lab results in Epic.  Imaging: DG Abd Portable 1V  Result Date: 01/07/2021 CLINICAL DATA:  Check gastric catheter EXAM: PORTABLE ABDOMEN - 1 VIEW COMPARISON:  None. FINDINGS: Feeding catheter is noted extending into the distal aspect of the stomach. Scattered large and small bowel gas is noted. IMPRESSION: Feeding catheter within the distal stomach. Electronically Signed   By: 01/09/2021 M.D.   On: 01/07/2021 12:22     Imaging independently reviewed in Epic.    01/09/2021 for  Infectious Disease [facility] [phone] pager 01/08/2021, 10:31 AM

## 2021-01-08 NOTE — Progress Notes (Signed)
[facility] Progress Note   Assessment/ Plan:    AKI - presumably ischemic ATN in setting of severe sepsis syndrome and hypotension requiring multiple pressors.  She was started on CRRT 12/28/20 vir RIJ HD catheter placed by PCCM.  Volume status better, off pressors.  Will stop CRRT today 01/08/21 and will transition to IHD when appropriate 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 : s/p debridement on 12/28/20, 12/31/20, and 01/03/21, surgery following and no more plans for OR as of now Hyperkalemia - improved with resumption of CRRT as above. VDRF - per PCCM Atrial flutter with RVR:  Cardiology following.  On amiodarone and off levophed. DM type 2 - per primary Acute metabolic encephalopathy Anemia of critical illness - transfuse prn. Hypophosphatemia - repleted IV and follow. Dispo: in ICU  Subjective:    Good volume removal with CRRT, no pressors.  No complaints today.     Objective:   BP (!) 92/52   Pulse (!) 120   Temp 99.3 F (37.4 C) (Axillary)   Resp (!) 25   Ht 5\' 2"  (1.575 m)   Wt 93.4 kg   SpO2 96%   BMI 37.66 kg/m   Physical Exam: Gen: sleeping, NAD CVS: tachycardic Resp: clear Abd: soft GU: flexiseal in place Ext: trace LE edema ACCESS: R IJ nontunneled HD catheter  Labs: BMET Recent Labs  Lab 01/05/21 0553 01/06/21 0847 01/06/21 1547 01/07/21 0030 01/07/21 0742 01/07/21 1600 01/08/21 0153  NA 134* 134* 134* 135 134* 134* 132*  K 4.3 4.2 4.5 4.0 3.9 4.4 4.3  CL 103 102 103 101 101 100 99  CO2 24 28 23 25 28 27 24   GLUCOSE 200* 190* 189* 132* 94 139* 166*  BUN 29* 22* 23* 19 17 15 15   CREATININE 1.61* 1.05* 1.09* 0.95 0.86 0.87 0.95  CALCIUM 7.6* 7.7* 7.8* 7.7* 7.7* 8.0* 8.3*  PHOS 4.2 3.0 3.4 3.1 2.9 2.9 2.3*   CBC Recent Labs  Lab 01/02/21 0510 01/03/21 01/04/21 0149 01/06/21 0847 01/06/21 0928 01/06/21 1547 01/07/21 1600 01/08/21 0153  WBC 23.5* 15.4* 15.1* 10.4  --   --  10.2 10.6*  NEUTROABS 19.1* 11.5*   --   --   --   --   --   --   HGB 7.3* 8.7* 8.3* 6.3* 6.9* 8.1* 7.5* 7.6*  HCT 22.2* 26.3* 24.5* 19.4* 21.0* 25.2* 22.9* 23.4*  MCV 94.9 94.9 93.9 98.5  --   --  97.9 97.9  PLT PLATELET CLUMPS NOTED ON SMEAR, UNABLE TO ESTIMATE PLATELET CLUMPS NOTED ON SMEAR, COUNT APPEARS DECREASED 165 147*  --   --  209 253      Medications:     amiodarone  200 mg Per Tube Q1200   B-complex with vitamin C  1 tablet Per Tube Daily   Chlorhexidine Gluconate Cloth  6 each Topical Q0600   clonazepam  0.5 mg Per Tube BID   docusate  50 mg Per Tube Daily   feeding supplement (PROSource TF)  45 mL Per Tube QID   insulin aspart  0-20 Units Subcutaneous Q4H   insulin aspart  3 Units Subcutaneous Q4H   mouth rinse  15 mL Mouth Rinse BID   midodrine  10 mg Per Tube TID WC   oxyCODONE  10 mg Per Tube Q6H   pantoprazole sodium  40 mg Per Tube Daily   QUEtiapine  25 mg Per Tube QHS   sodium chloride flush  10-40 mL Intracatheter Q12H  Bufford Buttner MD 01/08/2021, 10:10 AM

## 2021-01-08 NOTE — Progress Notes (Signed)
Modified Barium Swallow Progress Note  Patient Details  Name: [name] [name] MRN: [id number] Date of Birth: [birth year]  Today's Date: 01/08/2021  Modified Barium Swallow completed.  Full report located under Chart Review in the Imaging Section.  Brief recommendations include the following:  Clinical Impression  Pt presents with a mild oral and mild to moderate pharyngeal dysphagia, likely from deconditioning and prolonged intubation. Pt with mild oral residuals with solids and thicker POs, clearing with second swallows. Prolonged mastication noted with solids (pt states at baseline consuming regular diet, despite edentulous status). Pharyngeally pt with reduced arytenoid to base of epiglottic contact allowing for during the swallow laryngeal penetration of thin liquids (PAS-3) and episodic silent aspiration (PAS-8) with thins during larger consecutive swallows including cup and straw use. Airway protection was intact with remaining POs. Mild vallecular residuals noted with puree and mechanical soft textures due to reduced pharyngeal constriction and decreased base of tongue retraction. Recommend dysphagia 3 (mechanical soft) and nectar thick liquids with meds whole in puree. SLP to follow up for diet tolerance and advancement as pt tolerates.   Swallow Evaluation Recommendations       SLP Diet Recommendations: Nectar thick liquid;Dysphagia 3 (Mech soft) solids   Liquid Administration via: Cup;Straw   Medication Administration: Whole meds with puree   Supervision: Staff to assist with self feeding;Full supervision/cueing for compensatory strategies   Compensations: Slow rate;Small sips/bites;Clear throat intermittently;Minimize environmental distractions   Postural Changes: Seated upright at 90 degrees;Remain semi-upright after after feeds/meals (Comment)   Oral Care Recommendations: Oral care BID        Ardyth Gal MA, CCC-SLP Acute Rehabilitation Services   01/08/2021,10:38 AM

## 2021-01-08 NOTE — Progress Notes (Signed)
PHARMACY NOTE:  ANTIMICROBIAL RENAL DOSAGE ADJUSTMENT  Current antimicrobial regimen includes a mismatch between antimicrobial dosage and estimated renal function.  As per policy approved by the Pharmacy & Therapeutics and Medical Executive Committees, the antimicrobial dosage will be adjusted accordingly.  Current antimicrobial dosage:  IV Zosyn 3.375 gm Q6 hrs (CRRT dosing)  Indication: Wound Infection  Renal Function:  Estimated Creatinine Clearance: 77.9 mL/min (by C-G formula based on SCr of 0.95 mg/dL). []      On intermittent HD, scheduled: []      On CRRT    Antimicrobial dosage has been changed to:  IV Zosyn 3.375 gm IV q8 hrs (EI)  Additional comments: Stopping CRRT this morning. Stop date entered for 6/23, 2 weeks after last I&D on 6/9.   7/23, PharmD, BCPS PGY2 Cardiology Pharmacy Resident Phone: [phone] 01/08/2021  9:04 AM  Please check [email or web address] for unit-specific pharmacy phone numbers.

## 2021-01-08 NOTE — Progress Notes (Addendum)
Nutrition Follow-up  DOCUMENTATION CODES:   Not applicable  INTERVENTION:   Continue tube feeding via Cortrak tube: Vital 1.5 at 60 ml/h Prosource TF 45 ml QID Provides 2320 kcal, 141 gm protein, 1100 ml free water daily.  Change MVI to renal MVI daily per tube once daily.  Magic cup TID with meals, each supplement provides 290 kcal and 9 grams of protein.  NUTRITION DIAGNOSIS:   Increased nutrient needs related to wound healing, acute illness (L buttock abscess; CRRT) as evidenced by estimated needs.  Ongoing  GOAL:   Patient will meet greater than or equal to 90% of their needs   Progressing  MONITOR:   Vent status, TF tolerance, Labs, Skin  REASON FOR ASSESSMENT:   Ventilator, Consult Enteral/tube feeding initiation and management  ASSESSMENT:   50 yo female admitted from [facility] with worsening renal function, necrotizing soft tissue infection of left buttock. PMH includes Bartholin cyst, HTN, DM-2, GERD.   Discussed patient in ICU rounds and with RN today. CRRT is off. Transitioning to [facility] when needed. Plans to start hydrotherapy with PT today. Cortrak in place. Receiving Vital 1.5 at 60 ml/h with Prosource TF 45 ml QID. Tolerating well. S/P MBS with SLP this morning. Diet advanced to dysphagia 3 with nectar thick liquids.  Low phos being replaced today.  Labs reviewed. Na 132, phos 2.3 CBG: [phone]  Medications reviewed and include B-complex with vitamin C, Novolog, protonix.  Weight 93.4 kg today. I/O -4.9 L since admission UOP 305 ml x 24 hours Rectal tube in place, 50 ml output x 24 hours  Diet Order:   Diet Order             DIET DYS 3 Room service appropriate? Yes with Assist; Fluid consistency: Nectar Thick  Diet effective now                   EDUCATION NEEDS:   Not appropriate for education at this time  Skin:  Skin Assessment: Skin Integrity Issues: Skin Integrity Issues:: Other (Comment) Other: L buttock soft tissue  infection, S/P debridement  Last BM:  6/14 rectal tube, type 7  Height:   Ht Readings from Last 1 Encounters:  01/08/21 5\' 2"  (1.575 m)    Weight:   Wt Readings from Last 1 Encounters:  01/08/21 93.4 kg    Ideal Body Weight:  50 kg  BMI:  Body mass index is 37.66 kg/m.  Estimated Nutritional Needs:   Kcal:  2200-2500  Protein:  130-150 gm  Fluid:  >/= 2 L    01/10/21, RD, LDN, CNSC Please refer to Amion for contact information.

## 2021-01-08 NOTE — Progress Notes (Signed)
OT Cancellation Note  Patient Details Name: [name] [name] MRN: [id number] DOB: April 24, 1971   Cancelled Treatment:    Reason Eval/Treat Not Completed: Pain limiting ability to participate;Patient not medically ready. Per RN, patient with increased pain and agitation pulling at lines/leads and attempting to get out of bed. Per RN request, OT to hold at this time. Will check back as time allows.   Kallie Edward OTR/L Supplemental OT, Department of rehab services [phone]  Guerry Covington R H. 01/08/2021, 12:21 PM

## 2021-01-08 NOTE — Progress Notes (Signed)
ANTICOAGULATION CONSULT NOTE - Initial Consult  Pharmacy Consult for  IV Heparin Indication: atrial fibrillation/flutter  No Known Allergies  Patient Measurements: Height: 5\' 2"  (157.5 cm) Weight: 93.4 kg (205 lb 14.6 oz) IBW/kg (Calculated) : 50.1 Heparin Dosing Weight: 71.9 kg  Vital Signs: Temp: 99.4 F (37.4 C) (06/14 1146) Temp Source: Axillary (06/14 1146) BP: 115/68 (06/14 1800) Pulse Rate: 121 (06/14 1800)  Labs: Recent Labs    01/06/21 0847 01/06/21 0928 01/06/21 1547 01/07/21 0030 01/07/21 0742 01/07/21 1600 01/08/21 0153 01/08/21 1700  HGB 6.3*   < > 8.1*  --   --  7.5* 7.6*  --   HCT 19.4*   < > 25.2*  --   --  22.9* 23.4*  --   PLT 147*  --   --   --   --  209 253  --   HEPARINUNFRC  --   --   --   --   --   --   --  <0.10*  CREATININE 1.05*  --  1.09*   < > 0.86 0.87 0.95  --    < > = values in this interval not displayed.     Estimated Creatinine Clearance: 77.9 mL/min (by C-G formula based on SCr of 0.95 mg/dL).   Medical History: Past Medical History:  Diagnosis Date   Depression    DM (diabetes mellitus) (HCC)    GERD (gastroesophageal reflux disease)    HLD (hyperlipidemia)    Obesity    Tobacco abuse     Assessment: 50 yo female with wound infection, s/p multiple I&Ds and started on CRRT. Had new atrial fibrillation/flutter this admission and was started on IV amiodarone, which has since been switched to PO amiodarone. She was not started on systemic anticoagulation given I&Ds in the OR; last I&D 6/9 with no future procedures planned. Pt was on subcutaneous heparin for DVT prophylaxis, with last dose 6/14 at ~0530. Pharmacy was asked to transition to IV heparin.  Pt remains in atrial flutter this morning. CRRT stopped this AM. Hgb low but stable 7.6, transfused 1u PRBC most recently on 6/12. Plt count improved to 253. No overt bleeding noted.   Heparin level ~7 hrs after heparin infusion initiated at 1000 units/hr was <0.10 units/ml  (undetectable). Per RN, no issues with IV or bleeding observed.  Goal of Therapy:  Heparin level 0.3-0.7 units/ml Monitor platelets by anticoagulation protocol: Yes   Plan:  Increase heparin infusion to 1200 units/hr Check heparin level in 6 hrs Monitor daily heparin level, CBC, s/sx bleeding  8/12, PharmD, BCPS, [facility] Clinical Pharmacist 01/08/2021  7:16 PM

## 2021-01-08 NOTE — Progress Notes (Addendum)
PT Cancellation Note  Patient Details Name: [name] [name] MRN: [id number] DOB: 1971/07/25   Cancelled Treatment:    Reason Eval/Treat Not Completed: Fatigue/lethargy limiting ability to participate. Pt currently lethargic 2 pain meds. She is unable to be roused to interact with therapist, unable to open eyes. Will check back at the next available date to initiate PT eval.    Marylynn Pearson 01/08/2021, 1:30 PM  Conni Slipper, PT, DPT Acute Rehabilitation Services Pager: [phone] Office: [phone]

## 2021-01-09 ENCOUNTER — Inpatient Hospital Stay (HOSPITAL_COMMUNITY): Payer: Medicaid Other

## 2021-01-09 DIAGNOSIS — I5041 Acute combined systolic (congestive) and diastolic (congestive) heart failure: Secondary | ICD-10-CM | POA: Diagnosis not present

## 2021-01-09 DIAGNOSIS — N179 Acute kidney failure, unspecified: Secondary | ICD-10-CM | POA: Diagnosis not present

## 2021-01-09 DIAGNOSIS — E669 Obesity, unspecified: Secondary | ICD-10-CM | POA: Diagnosis not present

## 2021-01-09 LAB — CULTURE, BLOOD (ROUTINE X 2)
Culture: NO GROWTH
Culture: NO GROWTH
Special Requests: ADEQUATE
Special Requests: ADEQUATE

## 2021-01-09 LAB — RENAL FUNCTION PANEL
Albumin: 2.1 g/dL — ABNORMAL LOW (ref 3.5–5.0)
Anion gap: 9 (ref 5–15)
BUN: 24 mg/dL — ABNORMAL HIGH (ref 6–20)
CO2: 24 mmol/L (ref 22–32)
Calcium: 8 mg/dL — ABNORMAL LOW (ref 8.9–10.3)
Chloride: 101 mmol/L (ref 98–111)
Creatinine, Ser: 1.35 mg/dL — ABNORMAL HIGH (ref 0.44–1.00)
GFR, Estimated: 49 mL/min — ABNORMAL LOW (ref >60)
Glucose, Bld: 257 mg/dL — ABNORMAL HIGH (ref 70–99)
Phosphorus: 4.4 mg/dL (ref 2.5–4.6)
Potassium: 4 mmol/L (ref 3.5–5.1)
Sodium: 134 mmol/L — ABNORMAL LOW (ref 135–145)

## 2021-01-09 LAB — CBC
HCT: 21 % — ABNORMAL LOW (ref 36.0–46.0)
Hemoglobin: 6.6 g/dL — CL (ref 12.0–15.0)
MCH: 31.9 pg (ref 26.0–34.0)
MCHC: 31.4 g/dL (ref 30.0–36.0)
MCV: 101.4 fL — ABNORMAL HIGH (ref 80.0–100.0)
Platelets: 316 10*3/uL (ref 150–400)
RBC: 2.07 MIL/uL — ABNORMAL LOW (ref 3.87–5.11)
RDW: 17.2 % — ABNORMAL HIGH (ref 11.5–15.5)
WBC: 11 10*3/uL — ABNORMAL HIGH (ref 4.0–10.5)
nRBC: 0 % (ref 0.0–0.2)

## 2021-01-09 LAB — GLUCOSE, CAPILLARY
Glucose-Capillary: 194 mg/dL — ABNORMAL HIGH (ref 70–99)
Glucose-Capillary: 249 mg/dL — ABNORMAL HIGH (ref 70–99)
Glucose-Capillary: 207 mg/dL — ABNORMAL HIGH (ref 70–99)
Glucose-Capillary: 152 mg/dL — ABNORMAL HIGH (ref 70–99)
Glucose-Capillary: 241 mg/dL — ABNORMAL HIGH (ref 70–99)
Glucose-Capillary: 142 mg/dL — ABNORMAL HIGH (ref 70–99)

## 2021-01-09 LAB — HEPARIN LEVEL (UNFRACTIONATED): Heparin Unfractionated: 0.13 IU/mL — ABNORMAL LOW (ref 0.30–0.70)

## 2021-01-09 LAB — HEMOGLOBIN AND HEMATOCRIT, BLOOD
HCT: 23.5 % — ABNORMAL LOW (ref 36.0–46.0)
Hemoglobin: 7.8 g/dL — ABNORMAL LOW (ref 12.0–15.0)

## 2021-01-09 LAB — PREPARE RBC (CROSSMATCH)

## 2021-01-09 MED ORDER — FENTANYL 50 MCG/HR TD PT72
1.0000 | MEDICATED_PATCH | TRANSDERMAL | Status: DC
  Filled 2021-01-09: qty 1

## 2021-01-09 MED ORDER — SODIUM CHLORIDE 0.9% IV SOLUTION: Freq: Once | INTRAVENOUS | Status: AC

## 2021-01-09 MED ORDER — INSULIN GLARGINE 100 UNIT/ML ~~LOC~~ SOLN
10.0000 [IU] | Freq: Two times a day (BID) | SUBCUTANEOUS | Status: DC
  Administered 2021-01-09 – 2021-01-17 (×16): 10 [IU] via SUBCUTANEOUS
  Filled 2021-01-09 (×19): qty 0.1

## 2021-01-09 MED ORDER — FENTANYL CITRATE (PF) 100 MCG/2ML IJ SOLN
50.0000 ug | INTRAMUSCULAR | Status: DC | PRN
  Administered 2021-01-09 – 2021-01-16 (×10): 50 ug via INTRAVENOUS
  Filled 2021-01-09 (×13): qty 2

## 2021-01-09 MED ORDER — FENTANYL 12 MCG/HR TD PT72
1.0000 | MEDICATED_PATCH | TRANSDERMAL | Status: DC
  Administered 2021-01-09 – 2021-01-15 (×3): 1 via TRANSDERMAL
  Filled 2021-01-09 (×3): qty 1

## 2021-01-09 MED ORDER — QUETIAPINE FUMARATE 50 MG PO TABS
50.0000 mg | ORAL_TABLET | Freq: Every day | ORAL | Status: DC
  Administered 2021-01-09 – 2021-01-12 (×4): 50 mg
  Filled 2021-01-09 (×4): qty 1

## 2021-01-09 MED ORDER — PIPERACILLIN-TAZOBACTAM 3.375 G IVPB
3.3750 g | Freq: Two times a day (BID) | INTRAVENOUS | Status: DC
  Administered 2021-01-09 – 2021-01-10 (×2): 3.375 g via INTRAVENOUS
  Filled 2021-01-09 (×3): qty 50

## 2021-01-09 MED ORDER — CLONAZEPAM 0.5 MG PO TBDP
1.0000 mg | ORAL_TABLET | Freq: Two times a day (BID) | ORAL | Status: DC
  Administered 2021-01-09 – 2021-01-13 (×9): 1 mg
  Filled 2021-01-09 (×9): qty 2

## 2021-01-09 NOTE — Progress Notes (Signed)
NAME:  [name] [name], MRN:  [id number], DOB:  01-08-1971, LOS: 12 ADMISSION DATE:  12/28/2020, CONSULTATION DATE:  6/3 REFERRING MD:  [facility] MD, CHIEF COMPLAINT:  Fever, pain and ulceration  History of Present Illness:  50 y/o female initially admitted to [facility] on 5/29 complaining of fever and a buttock abscess, moved to [facility] on 6/3 in the setting of worsening renal failure, acidosis, encephaloapthy with known group B strep wound culture.  Pertinent  Medical History  Recent Vulvar irritation and Bartholin Cyst seen at [facility]  HTN DM type II Cholecystectomy  GERD Anxiety  Significant Hospital Events:  5/29 admitted to [facility] sepsis felt 2/2 cellulitis on buttocks. Cultures sent. CT abd/pelvis + [place] gas but no drainable abscess. Started on Vanc. IVFs. Wound also cultured.  6/1 wound culture back + group B strep. 1 of 4 blood cultures + GPC. ABX changed to rocephin 6/3 Arrived at [facility] from [facility] w/ new hypotension, worsening renal failure, worsening leukocytosis, poorly controlled pain and new encephalopathy. Admitted to ICU. Started on zyvox and meropenem. Surgical consult requested> to OR for debridement 6/3 started CRRT, meropenem and Linezolid added 6/4 IVIg >6/6 6/5 Meropenem stopped 6/6 zosyn added  6/6 back to OR for debridement, in and out of atrial flutter; QTc 590; cardiology consult> amiodarone 6/7 off vasopressors, continues to have atrial flutter, started ketamine, Linezolid stopped 6/8 started on metoprolol, echo with LVEF 45-50%.  6/9 OR debridement  6/11 Fever curve improved, remains on CRRT  6/12 extubated 6/13 remains on CRRT 6/14 CRRT stopped, continues to have pain and agitation issues 6/15 agitated, fell out of bed, reported that she hit her head, head CT>>  Interim History / Subjective:  Remains delirious Fall this AM.  Objective   Blood pressure 116/67, pulse (!) 126, temperature 99.8 F (37.7 C), temperature source Axillary, resp. rate  (!) 25, height 5\' 2"  (1.575 m), weight 93.4 kg, SpO2 98 %.        Intake/Output Summary (Last 24 hours) at 01/09/2021 0805 Last data filed at 01/09/2021 0600 Gross per 24 hour  Intake 2479.26 ml  Output 313 ml  Net 2166.26 ml    Filed Weights   01/07/21 0459 01/08/21 0400 01/08/21 0800  Weight: 99.2 kg 93.4 kg 93.4 kg    Examination: Constitutional: chronically ill appearing woman in NAD  Eyes: pupils equal, reactive, tracking Ears, nose, mouth, and throat: MMM, trachea midline Cardiovascular: tachy, ext warm Respiratory: diminished bases, no accessory muscle use Gastrointestinal: soft, +BS Skin: No rashes, normal turgor, defer surgical site eval to CCS Neurologic: moves all 4 ext to command, globally weak Psychiatric: RASS -1, intermittent odd responses but Aox3  Labs pending  Labs/imaging that I havepersonally reviewed   Chemistries look good CBC stable  Resolved Hospital Problem list     Assessment & Plan:   Septic shock due to polymicrobial necrotizing soft tissue infection- improved, ongoing vasoplegia with attempts to pull fluid;  Septic ATN, hyperkalemia- off CRRT 6/14 Afib, new onset in setting of sepsis- AC on hold after fall ABLA in setting of surgery- last transfused 6/12 Heart failure question Afib/RVR induced- think she is near euvolemia Acute respiratory failure with hypoxemia- improved Baseline narcotic and benzo dependence Ongoing ICU delirium- is a problem DM2 with hyperglycemia- remains issue 6/15 fall with hitting head- neuro exam nonfocal  - TF + SSI insulin, start lantus 10 units BID - f/u AM CBC - Heparin on hold, head CT, resume heparin tomorrow if neuro exam stable - CRRT  on hold, will do iHD at some point this week - 2 weeks zosyn after 6/9 (presumed source control): 6/23; ID following - PAD changes: increase benzo back up (using lots of PRNs due to baseline anxiety), start fentanyl patch, reduce fentanyl PRNs, increase seroquel, add  bedside sitter - PT/OT/SLP input appreciated - Continue rectal tube - Continue ICU level care until delirium under better control  Best practice   Diet:  Tube Feed  Pain/Anxiety/Delirium protocol (if indicated): No VAP protocol (if indicated): No DVT prophylaxis: off GI prophylaxis: H2B Glucose control:  SSI Yes Central venous access:  Yes, and it is still needed Arterial line:  out Foley:  out Mobility:  bed rest  PT consulted: Yes Last date of multidisciplinary goals of care discussion will update when they come in Code Status:  full code Disposition: remain in ICU another day after fall and to see if can tolerate iHD   Patient critically ill due to shock, renal failure, necrotizing infection, encephalopathy Interventions to address this today pain/agitation/delirium med titration, midodrine titration Risk of deterioration without these interventions is high  I personally spent 32 minutes providing critical care not including any separately billable procedures  Myrla Halsted MD [facility]  Prefer epic messenger for cross cover needs If after hours, please call E-link

## 2021-01-09 NOTE — Progress Notes (Signed)
[facility]  Date of Admission:  12/28/2020           Reason for visit: Follow up on necrotizing skin infection  Current antibiotics: Pip-tazo 6/6--present     Previous antibiotics: Meropenem 6/3--6/6 Linezolid 6/3--6/8  ASSESSMENT:    Polymicrobial necrotizing skin and soft tissue infection: Involving the left buttock/posterior thigh status post multiple debridements on 6/3, 6/6, 6/9.  Intraoperative cultures with E. coli, GBS, and Prevotella.  Currently on piperacillin tazobactam with no further debridement planned by general surgery at this time. AKI: Followed by nephrology.  Transitioned off CRRT yesterday and will follow for possible transition to intermittent HD. Diabetes, obesity Atrial flutter with RVR: Cardiology following Heart failure: Possibly A. fib/RVR induced, however, will require ischemic evaluation at some point per cardiology  PLAN:    Continue piperacillin tazobactam Wound care Glycemic control Will follow   Principal Problem:   Necrotizing soft tissue infection Active Problems:   Septic shock (HCC)   Sepsis due to Streptococcus, group B (HCC)   Acute kidney injury (HCC)   Obesity   Type 2 diabetes mellitus with hyperglycemia (HCC)   Acute hypercapnic respiratory failure (HCC)   Typical atrial flutter (HCC)   Hyperkalemia   New onset a-fib (HCC)   Acute combined systolic and diastolic heart failure (HCC)   Acute metabolic encephalopathy   History of ETT   Encounter for central line placement   Encounter for orogastric (OG) tube placement    MEDICATIONS:    Scheduled Meds: . amiodarone  200 mg Per Tube Q1200  . Chlorhexidine Gluconate Cloth  6 each Topical Q0600  . clonazepam  1 mg Per Tube BID  . docusate  50 mg Per Tube Daily  . feeding supplement (PROSource TF)  45 mL Per Tube QID  . fentaNYL  1 patch Transdermal Q72H  . insulin aspart  0-20 Units Subcutaneous Q4H  . insulin aspart  3 Units Subcutaneous  Q4H  . insulin glargine  10 Units Subcutaneous BID  . mouth rinse  15 mL Mouth Rinse BID  . midodrine  10 mg Per Tube TID WC  . multivitamin  1 tablet Per Tube QHS  . oxyCODONE  10 mg Per Tube Q6H  . pantoprazole sodium  40 mg Per Tube Daily  . QUEtiapine  50 mg Per Tube QHS  . sodium chloride flush  10-40 mL Intracatheter Q12H   Continuous Infusions: . sodium chloride 250 mL (01/07/21 1223)  . feeding supplement (VITAL 1.5 CAL) 1,000 mL (01/09/21 0520)  . piperacillin-tazobactam (ZOSYN)  IV 12.5 mL/hr at 01/09/21 0600   PRN Meds:.fentaNYL (SUBLIMAZE) injection, food thickener, heparin, levalbuterol, LORazepam, ondansetron (ZOFRAN) IV, polyethylene glycol, sodium chloride flush  SUBJECTIVE:   24 hour events:  Fell this morning, CT head pending Afebrile, Tmax 99.8 Tachycardic No new cx No CBC, BMP this morning SLP evaluation done  Patient reports that she fell this morning.  Currently denies any pain, nausea, vomiting, diarrhea, or fevers.  No other acute complaints  Review of Systems  All other systems reviewed and are negative.    OBJECTIVE:   Blood pressure 116/67, pulse (!) 126, temperature 99.8 F (37.7 C), temperature source Axillary, resp. rate (!) 25, height 5\' 2"  (1.575 m), weight 93.4 kg, SpO2 98 %. Body mass index is 37.66 kg/m.  Physical Exam Constitutional:      Comments: Chronically ill-appearing, lethargic, lying in bed, no acute distress  HENT:     Head: Normocephalic  and atraumatic.  Eyes:     Extraocular Movements: Extraocular movements intact.     Conjunctiva/sclera: Conjunctivae normal.  Neck:     Comments: Right IJ nontunneled HD catheter Cardiovascular:     Rate and Rhythm: Tachycardia present.  Pulmonary:     Effort: Pulmonary effort is normal. No respiratory distress.  Skin:    General: Skin is warm and dry.     Findings: No rash.  Neurological:     General: No focal deficit present.  Psychiatric:     Comments: She is lethargic      Lab Results: Lab Results  Component Value Date   WBC 10.6 (H) 01/08/2021   HGB 7.6 (L) 01/08/2021   HCT 23.4 (L) 01/08/2021   MCV 97.9 01/08/2021   PLT 253 01/08/2021    Lab Results  Component Value Date   NA 132 (L) 01/08/2021   K 4.3 01/08/2021   CO2 24 01/08/2021   GLUCOSE 166 (H) 01/08/2021   BUN 15 01/08/2021   CREATININE 0.95 01/08/2021   CALCIUM 8.3 (L) 01/08/2021   GFRNONAA >60 01/08/2021    Lab Results  Component Value Date   ALT 20 01/04/2021   AST 34 01/04/2021   ALKPHOS 374 (H) 01/04/2021   BILITOT 1.4 (H) 01/04/2021    No results found for: CRP  No results found for: ESRSEDRATE   I have reviewed the micro and lab results in Epic.  Imaging: CT HEAD WO CONTRAST  Result Date: 01/09/2021 CLINICAL DATA:  Pain following fall EXAM: CT HEAD WITHOUT CONTRAST TECHNIQUE: Contiguous axial images were obtained from the base of the skull through the vertex without intravenous contrast. COMPARISON:  October 10, 2007 FINDINGS: Brain: Ventricles and sulci are normal in size and configuration. There is no intracranial mass, hemorrhage, extra-axial fluid collection, or midline shift. Brain parenchyma appears unremarkable. No appreciable acute infarct. Vascular: No hyperdense vessel. Mild calcification in the carotid siphon regions. Skull: Bony calvarium appears intact. Sinuses/Orbits: Paranasal sinuses are clear. Orbits appear symmetric bilaterally. Other: Mastoid air cells clear. IMPRESSION: Normal appearing brain parenchyma. No mass, hemorrhage, extra-axial fluid collection. Mild arterial vascular calcification noted. Electronically Signed   By: Bretta Bang III M.D.   On: 01/09/2021 08:51   DG Abd Portable 1V  Result Date: 01/07/2021 CLINICAL DATA:  Check gastric catheter EXAM: PORTABLE ABDOMEN - 1 VIEW COMPARISON:  None. FINDINGS: Feeding catheter is noted extending into the distal aspect of the stomach. Scattered large and small bowel gas is noted. IMPRESSION:  Feeding catheter within the distal stomach. Electronically Signed   By: Alcide Clever M.D.   On: 01/07/2021 12:22   DG Swallowing Func-Speech Pathology  Result Date: 01/08/2021 Formatting of this result is different from the original. Objective Swallowing Evaluation: Type of Study: MBS-Modified Barium Swallow Study  Patient Details Name: [name] [name] MRN: [id number] Date of Birth: 06/07/1971 Today's Date: 01/08/2021 Time: SLP Start Time (ACUTE ONLY): 1008 -SLP Stop Time (ACUTE ONLY): 1020 SLP Time Calculation (min) (ACUTE ONLY): 12 min Past Medical History: Past Medical History: Diagnosis Date . Depression  . DM (diabetes mellitus) (HCC)  . GERD (gastroesophageal reflux disease)  . HLD (hyperlipidemia)  . Obesity  . Tobacco abuse  Past Surgical History: Past Surgical History: Procedure Laterality Date . ABCESS DRAINAGE  11/2020  bartholin gland cyst . INCISION AND DRAINAGE ABSCESS Left 12/31/2020  Procedure: INCISION AND DRAINAGE ABSCESS BUTTOCK;  Surgeon: Axel Filler, MD;  Location: [facility] OR;  Service: General;  Laterality: Left; . IRRIGATION AND DEBRIDEMENT BUTTOCKS  Left 12/28/2020  Procedure: DEBRIDEMENT LEFT BUTTOCK NECROTIZING SOFT TISSUE INFECTION - TOTAL TISSUE VOLUME 14CM X 12CM X 3CM DEEP;  Surgeon: Violeta Gelinashompson, Burke, MD;  [facility];  Service: General;  Laterality: Left; . WOUND DEBRIDEMENT Left 01/03/2021  Procedure: IRRIGATION AND DEBRIDEMENT LEFT BUTTOCK;  Surgeon: Axel Filleramirez, Armando, MD;  [facility];  Service: General;  Laterality: Left; HPI: 50 y/o female [facility] complaining of fever and a buttock abscess, [facility] in the setting of worsening renal failure, acidosis, encephaloapthy with known group B strep wound culture. ETT 6/4-6/12, hx GERD, obesity.  Subjective: alert, upright in chair for procedure Assessment / Plan / Recommendation CHL IP CLINICAL IMPRESSIONS 01/08/2021 Clinical Impression Pt presents with a mild oral and mild to moderate pharyngeal  dysphagia, likely from deconditioning and prolonged intubation. Pt with mild oral residuals with solids and thicker POs, clearing with second swallows. Prolonged mastication noted with solids (pt states at baseline consuming regular diet, despite edentulous status). Pharyngeally pt with reduced arytenoid to base of epiglottic contact allowing for during the swallow laryngeal penetration of thin liquids (PAS-3) and episodic silent aspiration (PAS-8) with thins during larger consecutive swallows including cup and straw use. Airway protection was intact with remaining POs. Mild vallecular residuals noted with puree and mechanical soft textures due to reduced pharyngeal constriction and decreased base of tongue retraction. Recommend dysphagia 3 (mechanical soft) and nectar thick liquids with meds whole in puree. SLP to follow up for diet tolerance and advancement as pt tolerates.  SLP Visit Diagnosis Dysphagia, oropharyngeal phase (R13.12) Attention and concentration deficit following -- Frontal lobe and executive function deficit following -- Impact on safety and function Mild aspiration risk;Moderate aspiration risk   CHL IP TREATMENT RECOMMENDATION 01/08/2021 Treatment Recommendations Therapy as outlined in treatment plan below   Prognosis 01/08/2021 Prognosis for Safe Diet Advancement Good Barriers to Reach Goals Time post onset Barriers/Prognosis Comment -- CHL IP DIET RECOMMENDATION 01/08/2021 SLP Diet Recommendations Nectar thick liquid;Dysphagia 3 (Mech soft) solids Liquid Administration via Cup;Straw Medication Administration Whole meds with puree Compensations Slow rate;Small sips/bites;Clear throat intermittently;Minimize environmental distractions Postural Changes Seated upright at 90 degrees;Remain semi-upright after after feeds/meals (Comment)   CHL IP OTHER RECOMMENDATIONS 01/08/2021 Recommended Consults -- Oral Care Recommendations Oral care BID Other Recommendations --   CHL IP FOLLOW UP RECOMMENDATIONS  01/08/2021 Follow up Recommendations Other (comment)   CHL IP FREQUENCY AND DURATION 01/08/2021 Speech Therapy Frequency (ACUTE ONLY) min 2x/week Treatment Duration 2 weeks      CHL IP ORAL PHASE 01/08/2021 Oral Phase Impaired Oral - Pudding Teaspoon -- Oral - Pudding Cup -- Oral - Honey Teaspoon -- Oral - Honey Cup Delayed oral transit;Lingual/palatal residue Oral - Nectar Teaspoon -- Oral - Nectar Cup Lingual/palatal residue Oral - Nectar Straw -- Oral - Thin Teaspoon -- Oral - Thin Cup Lingual/palatal residue Oral - Thin Straw -- Oral - Puree Lingual/palatal residue Oral - Mech Soft Delayed oral transit;Decreased bolus cohesion;Lingual/palatal residue Oral - Regular -- Oral - Multi-Consistency -- Oral - Pill -- Oral Phase - Comment --  CHL IP PHARYNGEAL PHASE 01/08/2021 Pharyngeal Phase Impaired Pharyngeal- Pudding Teaspoon -- Pharyngeal -- Pharyngeal- Pudding Cup -- Pharyngeal -- Pharyngeal- Honey Teaspoon -- Pharyngeal -- Pharyngeal- Honey Cup Delayed swallow initiation-pyriform sinuses Pharyngeal -- Pharyngeal- Nectar Teaspoon -- Pharyngeal -- Pharyngeal- Nectar Cup Delayed swallow initiation-pyriform sinuses;Reduced tongue base retraction;Reduced pharyngeal peristalsis;Pharyngeal residue - valleculae Pharyngeal -- Pharyngeal- Nectar Straw -- Pharyngeal -- Pharyngeal- Thin Teaspoon -- Pharyngeal --  Pharyngeal- Thin Cup Delayed swallow initiation-pyriform sinuses;Penetration/Aspiration during swallow;Reduced airway/laryngeal closure;Reduced epiglottic inversion;Reduced pharyngeal peristalsis;Trace aspiration Pharyngeal Material enters airway, remains ABOVE vocal cords and not ejected out;Material enters airway, passes BELOW cords without attempt by patient to eject out (silent aspiration) Pharyngeal- Thin Straw Penetration/Aspiration during swallow;Delayed swallow initiation-pyriform sinuses;Reduced epiglottic inversion;Reduced airway/laryngeal closure Pharyngeal Material enters airway, remains ABOVE vocal cords  and not ejected out;Material enters airway, passes BELOW cords without attempt by patient to eject out (silent aspiration) Pharyngeal- Puree Delayed swallow initiation-vallecula Pharyngeal -- Pharyngeal- Mechanical Soft Delayed swallow initiation-vallecula;Reduced pharyngeal peristalsis;Reduced tongue base retraction;Pharyngeal residue - valleculae Pharyngeal -- Pharyngeal- Regular -- Pharyngeal -- Pharyngeal- Multi-consistency -- Pharyngeal -- Pharyngeal- Pill -- Pharyngeal -- Pharyngeal Comment --  CHL IP CERVICAL ESOPHAGEAL PHASE 01/08/2021 Cervical Esophageal Phase WFL Pudding Teaspoon -- Pudding Cup -- Honey Teaspoon -- Honey Cup -- Nectar Teaspoon -- Nectar Cup -- Nectar Straw -- Thin Teaspoon -- Thin Cup -- Thin Straw -- Puree -- Mechanical Soft -- Regular -- Multi-consistency -- Pill -- Cervical Esophageal Comment -- Chelsea E Hartness MA, CCC-SLP 01/08/2021, 10:36 AM                Imaging independently reviewed in Epic.    Vedia Coffer for Infectious Disease [facility] [phone] pager 01/09/2021, 9:48 AM

## 2021-01-09 NOTE — Progress Notes (Signed)
[name] [name], states she hit head. Tearful, confused, stating she was trying "to get off bus." Neuro exam normal. Will check head CT just to be safe and keep eye on neuro exam. Sitter now at bedside.  [name] Halsted MD PCCM

## 2021-01-09 NOTE — Progress Notes (Signed)
PHARMACY NOTE:  ANTIMICROBIAL RENAL DOSAGE ADJUSTMENT  Current antimicrobial regimen includes a mismatch between antimicrobial dosage and estimated renal function.  As per policy approved by the Pharmacy & Therapeutics and Medical Executive Committees, the antimicrobial dosage will be adjusted accordingly.  Current antimicrobial dosage:  IV Zosyn 3.375 gm Q8 hrs (EI)  Indication: Wound infection  Renal Function:  Estimated Creatinine Clearance: 54.8 mL/min (A) (by C-G formula based on SCr of 1.35 mg/dL (H)). []      On intermittent HD, scheduled: []      On CRRT    Antimicrobial dosage has been changed to:  IV Zosyn 3.375 gm Q12 hrs  Additional comments: No plans for HD yet. Making very minimal UOP. Will adjust abx given recent d/c of CRRT.   , PharmD, BCPS PGY2 Cardiology Pharmacy Resident Phone: [phone] 01/09/2021  12:45 PM  Please check [email or web address] for unit-specific pharmacy phone numbers.

## 2021-01-09 NOTE — Progress Notes (Signed)
6 Days Post-Op   Subjective/Chief Complaint: Confused and had a fall, head CT negative.  Pt seen during hydrotherapy.  Objective: Vital signs in last 24 hours: Temp:  [98.4 F (36.9 C)-99.8 F (37.7 C)] 99.8 F (37.7 C) (06/15 0705) Pulse Rate:  [119-130] 126 (06/15 0600) Resp:  [16-35] 25 (06/15 0600) BP: (84-156)/(52-113) 116/67 (06/15 0600) SpO2:  [90 %-99 %] 98 % (06/15 0600) Last BM Date: 01/08/21  Intake/Output from previous day: 06/14 0701 - 06/15 0700 In: 2619.3 [P.O.:326; I.V.:230.5; NG/GT:1820; IV Piggyback:106.8] Out: 682 [Stool:300] Intake/Output this shift: Total I/O In: 120 [NG/GT:120] Out: 100 [Stool:100]  General appearance: confused Incision/Wound:     Lab Results:  Recent Labs    01/08/21 0153 01/09/21 0856  WBC 10.6* 11.0*  HGB 7.6* 6.6*  HCT 23.4* 21.0*  PLT 253 316   BMET Recent Labs    01/08/21 0153 01/09/21 0856  NA 132* 134*  K 4.3 4.0  CL 99 101  CO2 24 24  GLUCOSE 166* 257*  BUN 15 24*  CREATININE 0.95 1.35*  CALCIUM 8.3* 8.0*   PT/INR  Anti-infectives: Anti-infectives (From admission, onward)    Start     Dose/Rate Route Frequency Ordered Stop   01/08/21 1400  piperacillin-tazobactam (ZOSYN) IVPB 3.375 g        3.375 g 12.5 mL/hr over 240 Minutes Intravenous Every 8 hours 01/08/21 0902 01/17/21 2359   01/04/21 1800  piperacillin-tazobactam (ZOSYN) IVPB 3.375 g  Status:  Discontinued        3.375 g 100 mL/hr over 30 Minutes Intravenous Every 6 hours 01/04/21 1059 01/08/21 0902   01/03/21 1400  piperacillin-tazobactam (ZOSYN) IVPB 3.375 g  Status:  Discontinued        3.375 g 12.5 mL/hr over 240 Minutes Intravenous Every 8 hours 01/03/21 1124 01/04/21 1059   01/01/21 1800  piperacillin-tazobactam (ZOSYN) IVPB 3.375 g  Status:  Discontinued        3.375 g 12.5 mL/hr over 240 Minutes Intravenous Every 6 hours 01/01/21 1059 01/03/21 1124   12/31/20 1400  piperacillin-tazobactam (ZOSYN) IVPB 3.375 g  Status:  Discontinued         3.375 g 12.5 mL/hr over 240 Minutes Intravenous Every 6 hours 12/31/20 0918 01/01/21 1059   12/29/20 1400  clindamycin (CLEOCIN) IVPB 900 mg  Status:  Discontinued        900 mg 100 mL/hr over 30 Minutes Intravenous Every 8 hours 12/29/20 1214 12/29/20 1234   12/28/20 2200  linezolid (ZYVOX) IVPB 600 mg  Status:  Discontinued        600 mg 300 mL/hr over 60 Minutes Intravenous Every 12 hours 12/28/20 1620 01/02/21 0920   12/28/20 1830  meropenem (MERREM) 1 g in sodium chloride 0.9 % 100 mL IVPB  Status:  Discontinued        1 g 200 mL/hr over 30 Minutes Intravenous Every 8 hours 12/28/20 1731 12/31/20 0918   12/28/20 1800  meropenem (MERREM) 1 g in sodium chloride 0.9 % 100 mL IVPB  Status:  Discontinued        1 g 200 mL/hr over 30 Minutes Intravenous Every 24 hours 12/28/20 1700 12/28/20 1731   12/28/20 1730  meropenem (MERREM) 1 g in sodium chloride 0.9 % 100 mL IVPB  Status:  Discontinued        1 g 200 mL/hr over 30 Minutes Intravenous Every 8 hours 12/28/20 1639 12/28/20 1700       Assessment/Plan: Necrotizing soft tissue infection left buttock POD#12  S/P DEBRIDEMENT LEFT BUTTOCK NECROTIZING SOFT TISSUE INFECTION - TOTAL TISSUE VOLUME 14CM X 12CM X 3CM DEEP 12/28/2020  Dr. Violeta Gelinas POD#9 S/P INCISION AND DRAINAGE ABSCESS BUTTOCK (Left), 16x15x3cm wound; 6/6 Dr. Derrell Lolling POD#6 S/P IRRIGATION AND DEBRIDEMENT LEFT BUTTOCK 6/9 Dr. Derrell Lolling  - WBC 11 from 10 - Intraoperative cultures 6/3 with GBS, E. coli, and Prevotella - Wound appears clean and is responding well to hydrotherapy, continue hydro PT. We will look a the wound again on Friday 6/17.  - continue moist-to-dry dressing changes.  - continue IV abx per ID  Below per CCM: VDRF Septic shock Renal failure - per nephrology, CRRT stopped yesterday 6/14 per renal, now making urine DM2 Obesity A. Flutter with RVR - started on hep gtt. Yesterday, now held due to fall and anemia.    FEN: DYS3 ID:  merrem 6/3-6/6,  linezolid 6/3-6/8, Zosyn 6/6 >> abx per ID VTE: SCD's, SQH Foley: in place for strict I&O's, placed 6/3 Dispo: ICU   LOS: 12 days    [name] [name] 01/09/2021

## 2021-01-09 NOTE — Progress Notes (Signed)
ANTICOAGULATION CONSULT NOTE  Pharmacy Consult for  IV Heparin Indication: atrial fibrillation/flutter   Labs: Recent Labs    01/06/21 0847 01/06/21 0928 01/06/21 1547 01/07/21 0030 01/07/21 0742 01/07/21 1600 01/08/21 0153 01/08/21 1700 01/09/21 0130  HGB 6.3*   < > 8.1*  --   --  7.5* 7.6*  --   --   HCT 19.4*   < > 25.2*  --   --  22.9* 23.4*  --   --   PLT 147*  --   --   --   --  209 253  --   --   HEPARINUNFRC  --   --   --   --   --   --   --  <0.10* 0.13*  CREATININE 1.05*  --  1.09*   < > 0.86 0.87 0.95  --   --    < > = values in this interval not displayed.    Assessment: 50 yo female with wound infection, s/p multiple I&Ds and started on CRRT. Had new atrial fibrillation/flutter this admission and was started on IV amiodarone, which has since been switched to PO amiodarone. She was not started on systemic anticoagulation given I&Ds in the OR; last I&D 6/9 with no future procedures planned. Pt was on subcutaneous heparin for DVT prophylaxis, with last dose 6/14 at ~0530. Pharmacy was asked to transition to IV heparin.  Heparin level this am 0.13 units/ml. No bleeding reported  Goal of Therapy:  Heparin level 0.3-0.7 units/ml Monitor platelets by anticoagulation protocol: Yes   Plan:  Increase heparin infusion to 1400 units/hr Check heparin level in 6 hrs Monitor daily heparin level, CBC, s/sx bleeding  Talbert Cage, PharmD Clinical Pharmacist 01/09/2021  2:28 AM

## 2021-01-09 NOTE — Progress Notes (Signed)
PT Cancellation Note  Patient Details Name: [name] [name] MRN: [id number] DOB: 24-Jan-1971   Cancelled Treatment:    Reason Eval/Treat Not Completed: Medical issues which prohibited therapy;Other (comment) (Pt fell out of bed this am and needed to have CT as she hit her head.  Nurse asked PT to let her rest today and come to evaluate tomorrow.)   [name] 01/09/2021, 10:24 AM Hakim Minniefield M,PT Acute Rehab Services [phone] (pager)

## 2021-01-09 NOTE — Progress Notes (Addendum)
Physical Therapy Wound Treatment Patient Details  Name: Yeimy Brabant MRN: [id number] Date of Birth: [birth year]  Today's Date: 01/09/2021 Time: 1100-1136 Time Calculation (min): 36 min  Subjective  Subjective Assessment Subjective: Pleasant and agreeable, asking for water. Patient and Family Stated Goals: None stated Date of Onset:  (Unsure) Prior Treatments: I&D 6/9  Pain Score:  Pt with minimal complaints of pain - was premedicated 30 mins prior to arrival.   Wound Assessment  Wound / Incision (Open or Dehisced) 12/28/20 Buttocks Left cyst- reddened, swollen area with brown drainage coming from left lower buttocks (Active)  Dressing Type ABD;Barrier Film (skin prep);Gauze (Comment);Moist to moist 01/09/21 1500  Dressing Changed Changed 01/09/21 1500  Dressing Status Clean;Dry;Intact 01/09/21 1500  Dressing Change Frequency Daily 01/09/21 1500  Site / Wound Assessment Red;Yellow;Black;Brown 01/09/21 1500  % Wound base Red or Granulating 50% 01/09/21 1500  % Wound base Yellow/Fibrinous Exudate 35% 01/09/21 1500  % Wound base Black/Eschar 15% 01/09/21 1500  % Wound base Other/Granulation Tissue (Comment) 0% 01/09/21 1500  Peri-wound Assessment Intact 01/09/21 1500  Wound Length (cm) 20 cm 01/08/21 1300  Wound Width (cm) 20 cm 01/08/21 1300  Wound Depth (cm) 4.8 cm 01/08/21 1300  Wound Volume (cm^3) 1920 cm^3 01/08/21 1300  Wound Surface Area (cm^2) 400 cm^2 01/08/21 1300  Tunneling (cm) 9 cm tunnel at 6:00 01/08/21 1300  Undermining (cm) up to 3 cm from 9:00 to 12:00 01/08/21 1300  Margins Unattached edges (unapproximated) 01/09/21 1500  Closure None 01/09/21 0000  Drainage Amount None 01/09/21 1500  Drainage Description Serosanguineous 01/09/21 1500  Non-staged Wound Description Full thickness 01/09/21 1500  Treatment Debridement (Selective);Hydrotherapy (Pulse lavage);Packing (Saline gauze) 01/09/21 1500   Hydrotherapy Pulsed lavage therapy - wound location: L  buttock Pulsed Lavage with Suction (psi): 12 psi Pulsed Lavage with Suction - Normal Saline Used: 1000 mL Pulsed Lavage Tip: Tip with splash shield Selective Debridement Selective Debridement - Location: L buttock Selective Debridement - Tools Used: Forceps, Scissors Selective Debridement - Tissue Removed: Loose yellow slough and tightly adhered black necrotic tissue    Wound Assessment and Plan  Wound Therapy - Assess/Plan/Recommendations Wound Therapy - Clinical Statement: Progressing with debridement. Pt tolerated well with minimal complaints of pain. Will continue to follow for selective removal of unviable tissue, to decrease bioburden and promote wound bed healing. Wound Therapy - Functional Problem List: Decreased tolerance for OOB to chair and position changes due to pain and decreased skin integrity. Factors Delaying/Impairing Wound Healing: Infection - systemic/local, Incontinence Hydrotherapy Plan: Debridement, Dressing change, Patient/family education, Pulsatile lavage with suction Wound Therapy - Frequency: 6X / week Wound Therapy - Follow Up Recommendations: dressing changes by RN  Wound Therapy Goals- Improve the function of patient's integumentary system by progressing the wound(s) through the phases of wound healing (inflammation - proliferation - remodeling) by: Wound Therapy Goals - Improve the function of patient's integumentary system by progressing the wound(s) through the phases of wound healing by: Decrease Necrotic Tissue to: 0% Decrease Necrotic Tissue - Progress: Progressing toward goal Increase Granulation Tissue to: 100% Increase Granulation Tissue - Progress: Progressing toward goal Goals/treatment plan/discharge plan were made with and agreed upon by patient/family: Yes Time For Goal Achievement: 7 days Wound Therapy - Potential for Goals: Good  Goals will be updated until maximal potential achieved or discharge criteria met.  Discharge criteria: when  goals achieved, discharge from hospital, MD decision/surgical intervention, no progress towards goals, refusal/missing three consecutive treatments without notification or medical reason.  GP  Charges PT Wound Care Charges $Wound Debridement up to 20 cm: < or equal to 20 cm $ Wound Debridement each add'l 20 sqcm: 9 $PT PLS Gun and Tip: 1 Supply $PT Hydrotherapy Visit: 1 Visit       Thelma Comp 01/09/2021, 3:04 PM  Rolinda Roan, PT, DPT Acute Rehabilitation Services Pager: [phone] Office: [phone]

## 2021-01-09 NOTE — Progress Notes (Signed)
Received call from lab, pts hemoglobin is 6.6.  Relayed to CCM MD.

## 2021-01-09 NOTE — Progress Notes (Signed)
Patient was scored as a high fall risk and educated by shift RN not to ambulate without assistance. Bed alarm was set during shift but was not set at time of  fall discovery. Pt. was discovered lying on her left side crying out for help. Pt assisted back to bed. No apparent injuries observed. Pt not reliable historian and is unable to recall if she hit her head during fall CT per MD order

## 2021-01-09 NOTE — Progress Notes (Signed)
OT Cancellation Note  Patient Details Name: [name] [name] MRN: [id number] DOB: 1971-06-22   Cancelled Treatment:    Reason Eval/Treat Not Completed: Medical issues which prohibited therapy.  Pt with fall this am, and RN requested therapies hold today.  Eber Jones., OTR/L Acute Rehabilitation Services Pager [phone] Office [phone]   Jeani Hawking M 01/09/2021, 5:02 PM

## 2021-01-09 NOTE — Progress Notes (Signed)
  Speech Language Pathology Treatment: Dysphagia  Patient Details Name: [name] [name] MRN: [id number] DOB: Aug 15, 1970 Today's Date: 01/09/2021 Time: 9741-6384 SLP Time Calculation (min) (ACUTE ONLY): 18.8 min  Assessment / Plan / Recommendation Clinical Impression  Pt was seen for dysphagia treatment with NT present. NT and RN reported that the pt has been tolerating the current diet without overt s/sx of aspiration. Pt was educated regarding the results of the modified barium swallow study, diet recommendations, swallowing precautions, and procedures and evidence related to the water protocol. Video recording of the study was used to facilitate education and pt verbalized understanding regarding all areas of education. However, reinforcement will be needed. Pt consistently demonstrated at least 2-3 consecutive swallows verbal prompts and tactile cues to reduce intake rate and avoid consecutive swallows time . Coughing was noted when pt demonstrated three and occasionally four consecutive swallows, suggesting aspiration. Pt's current diet of dysphagia 3 solids and nectar thick liquids will be continued. Water (via cup) will still be allowed between meals following thorough oral care. SLP will continue to follow pt.    HPI HPI: 50 y/o female initially admitted to [facility] on 5/29 complaining of fever and a buttock abscess, moved to [facility] on 6/3 in the setting of worsening renal failure, acidosis, encephaloapthy with known group B strep wound culture. ETT 6/4-6/12, hx GERD, obesity.      SLP Plan  Continue with current plan of care       Recommendations  Diet recommendations: Dysphagia 3 (mechanical soft);Nectar-thick liquid Liquids provided via: Cup;Straw Medication Administration: Via alternative means Compensations: Slow rate;Small sips/bites;Clear throat intermittently;Minimize environmental distractions                Oral Care Recommendations: Oral care QID;Oral care prior to ice  chip/H20 Follow up Recommendations: Other (comment) (TBD per PT/OT recommendations) SLP Visit Diagnosis: Dysphagia, oropharyngeal phase (R13.12) Plan: Continue with current plan of care       Alexiya Franqui I. Vear Clock, MS, CCC-SLP Acute Rehabilitation Services Office number [phone] Pager [phone]        Scheryl Marten 01/09/2021, 9:38 AM

## 2021-01-09 NOTE — Progress Notes (Signed)
[facility] Progress Note   Assessment/ Plan:    AKI - presumably ischemic ATN in setting of severe sepsis syndrome and hypotension requiring multiple pressors.  She was started on CRRT 12/28/20 RIJ HD catheter placed by PCCM.  Volume status better, off pressors.  CRRT stopped 01/08/21.  Awaiting labs to assess level of renal recovery- encouraged that she is making urine.  Further dialytic needs to be determined 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 : s/p debridement on 12/28/20, 12/31/20, and 01/03/21, surgery following and no more plans for OR as of now Hyperkalemia - improved with resumption of CRRT as above. VDRF - per PCCM Atrial flutter with RVR:  Cardiology following.  On amiodarone and off levophed. DM type 2 - per primary Acute metabolic encephalopathy Anemia of critical illness - transfuse prn. Hypophosphatemia - repleted IV and follow.  Fall- Ct head negative 01/09/21 Dispo: in ICU  Subjective:    CRRT stopped yesterday.  Has had some UOP but has been incontinent- hard to quantify.  Labs pending today.  Fell and hit head- CT head neg.   Objective:   BP 116/67   Pulse (!) 126   Temp 99.8 F (37.7 C) (Axillary)   Resp (!) 25   Ht 5\' 2"  (1.575 m)   Wt 93.4 kg   SpO2 98%   BMI 37.66 kg/m   Physical Exam: Gen: sleeping, NAD, able to open eyes but falls back asleep quickly CVS: tachycardic Resp: clear Abd: soft GU: flexiseal in place Ext: trace LE edema ACCESS: R IJ nontunneled HD catheter  Labs: BMET Recent Labs  Lab 01/05/21 0553 01/06/21 0847 01/06/21 1547 01/07/21 0030 01/07/21 0742 01/07/21 1600 01/08/21 0153  NA 134* 134* 134* 135 134* 134* 132*  K 4.3 4.2 4.5 4.0 3.9 4.4 4.3  CL 103 102 103 101 101 100 99  CO2 24 28 23 25 28 27 24   GLUCOSE 200* 190* 189* 132* 94 139* 166*  BUN 29* 22* 23* 19 17 15 15   CREATININE 1.61* 1.05* 1.09* 0.95 0.86 0.87 0.95  CALCIUM 7.6* 7.7* 7.8* 7.7* 7.7* 8.0* 8.3*  PHOS 4.2 3.0 3.4 3.1 2.9 2.9  2.3*   CBC Recent Labs  Lab 01/03/21 0638 01/04/21 0149 01/06/21 0847 01/06/21 0928 01/06/21 1547 01/07/21 1600 01/08/21 0153 01/09/21 0856  WBC 15.4*   < > 10.4  --   --  10.2 10.6* 11.0*  NEUTROABS 11.5*  --   --   --   --   --   --   --   HGB 8.7*   < > 6.3*   < > 8.1* 7.5* 7.6* 6.6*  HCT 26.3*   < > 19.4*   < > 25.2* 22.9* 23.4* 21.0*  MCV 94.9   < > 98.5  --   --  97.9 97.9 101.4*  PLT PLATELET CLUMPS NOTED ON SMEAR, COUNT APPEARS DECREASED   < > 147*  --   --  209 253 316   < > = values in this interval not displayed.      Medications:     amiodarone  200 mg Per Tube Q1200   Chlorhexidine Gluconate Cloth  6 each Topical Q0600   clonazepam  1 mg Per Tube BID   docusate  50 mg Per Tube Daily   feeding supplement (PROSource TF)  45 mL Per Tube QID   fentaNYL  1 patch Transdermal Q72H   insulin aspart  0-20 Units Subcutaneous Q4H   insulin aspart  3 Units Subcutaneous Q4H   insulin glargine  10 Units Subcutaneous BID   mouth rinse  15 mL Mouth Rinse BID   midodrine  10 mg Per Tube TID WC   multivitamin  1 tablet Per Tube QHS   oxyCODONE  10 mg Per Tube Q6H   pantoprazole sodium  40 mg Per Tube Daily   QUEtiapine  50 mg Per Tube QHS   sodium chloride flush  10-40 mL Intracatheter Q12H    Bufford Buttner MD 01/09/2021, 10:50 AM

## 2021-01-09 NOTE — Progress Notes (Signed)
ANTICOAGULATION CONSULT NOTE  Pharmacy Consult for  IV Heparin Indication: atrial fibrillation/flutter  No Known Allergies  Patient Measurements: Height: 5\' 2"  (157.5 cm) Weight: 93.4 kg (205 lb 14.6 oz) IBW/kg (Calculated) : 50.1 Heparin Dosing Weight: 71.9 kg  Vital Signs: Temp: 99.8 F (37.7 C) (06/15 0705) Temp Source: Axillary (06/15 0705) BP: 116/67 (06/15 0600) Pulse Rate: 126 (06/15 0600)  Labs: Recent Labs    01/07/21 1600 01/08/21 0153 01/08/21 1700 01/09/21 0130 01/09/21 0856  HGB 7.5* 7.6*  --   --  6.6*  HCT 22.9* 23.4*  --   --  21.0*  PLT 209 253  --   --  316  HEPARINUNFRC  --   --  <0.10* 0.13*  --   CREATININE 0.87 0.95  --   --  1.35*     Estimated Creatinine Clearance: 54.8 mL/min (A) (by C-G formula based on SCr of 1.35 mg/dL (H)).   Medical History: Past Medical History:  Diagnosis Date   Depression    DM (diabetes mellitus) (HCC)    GERD (gastroesophageal reflux disease)    HLD (hyperlipidemia)    Obesity    Tobacco abuse     Assessment: 50 yo female with wound infection, s/p multiple I&Ds and started on CRRT. Had new atrial fibrillation/flutter this admission and was started on IV amiodarone, which has since been switched to PO amiodarone. She was not started on systemic anticoagulation given I&Ds in the OR; last I&D 6/9 with no future procedures planned. Pt was on subcutaneous heparin for DVT prophylaxis, with last dose 6/14 at ~0530. Pharmacy was asked to transition to IV heparin.  Heparin held early this morning after she fell and hit her head. CT of head post-fall is negative. Hgb down 6.6, to get 1u PRBC now. No overt bleeding. Now s/p hydrotherapy this morning. After discussing with Dr. 7/14, will hold on resuming heparin at this time and reassess on AM rounds tomorrow.  Goal of Therapy:  Heparin level 0.3-0.7 units/ml Monitor platelets by anticoagulation protocol: Yes   Plan:  Continue to hold heparin infusion Monitor CBC  and for bleeding F/u restart heparin tomorrow  Katrinka Blazing, PharmD, BCPS PGY2 Cardiology Pharmacy Resident Phone: [phone] 01/09/2021  12:47 PM  Please check [email or web address] for unit-specific pharmacy phone numbers.

## 2021-01-10 ENCOUNTER — Inpatient Hospital Stay (HOSPITAL_COMMUNITY): Payer: Medicaid Other

## 2021-01-10 DIAGNOSIS — I4891 Unspecified atrial fibrillation: Secondary | ICD-10-CM | POA: Diagnosis not present

## 2021-01-10 DIAGNOSIS — M7989 Other specified soft tissue disorders: Secondary | ICD-10-CM | POA: Diagnosis not present

## 2021-01-10 DIAGNOSIS — E669 Obesity, unspecified: Secondary | ICD-10-CM | POA: Diagnosis not present

## 2021-01-10 DIAGNOSIS — N179 Acute kidney failure, unspecified: Secondary | ICD-10-CM | POA: Diagnosis not present

## 2021-01-10 DIAGNOSIS — J9602 Acute respiratory failure with hypercapnia: Secondary | ICD-10-CM | POA: Diagnosis not present

## 2021-01-10 DIAGNOSIS — R509 Fever, unspecified: Secondary | ICD-10-CM | POA: Diagnosis not present

## 2021-01-10 LAB — CBC
HCT: 24.2 % — ABNORMAL LOW (ref 36.0–46.0)
Hemoglobin: 7.7 g/dL — ABNORMAL LOW (ref 12.0–15.0)
MCH: 32 pg (ref 26.0–34.0)
MCHC: 31.8 g/dL (ref 30.0–36.0)
MCV: 100.4 fL — ABNORMAL HIGH (ref 80.0–100.0)
Platelets: 336 10*3/uL (ref 150–400)
RBC: 2.41 MIL/uL — ABNORMAL LOW (ref 3.87–5.11)
RDW: 17.2 % — ABNORMAL HIGH (ref 11.5–15.5)
WBC: 10.2 10*3/uL (ref 4.0–10.5)
nRBC: 0 % (ref 0.0–0.2)

## 2021-01-10 LAB — GLUCOSE, CAPILLARY
Glucose-Capillary: 150 mg/dL — ABNORMAL HIGH (ref 70–99)
Glucose-Capillary: 174 mg/dL — ABNORMAL HIGH (ref 70–99)
Glucose-Capillary: 155 mg/dL — ABNORMAL HIGH (ref 70–99)
Glucose-Capillary: 197 mg/dL — ABNORMAL HIGH (ref 70–99)
Glucose-Capillary: 168 mg/dL — ABNORMAL HIGH (ref 70–99)
Glucose-Capillary: 179 mg/dL — ABNORMAL HIGH (ref 70–99)
Glucose-Capillary: 73 mg/dL (ref 70–99)

## 2021-01-10 LAB — BPAM RBC
Blood Product Expiration Date: 202206132359
Blood Product Expiration Date: 202206212359
ISSUE DATE / TIME: 202206121144
ISSUE DATE / TIME: 202206151251
Unit Type and Rh: 9500
Unit Type and Rh: 9500

## 2021-01-10 LAB — BASIC METABOLIC PANEL
Anion gap: 8 (ref 5–15)
BUN: 29 mg/dL — ABNORMAL HIGH (ref 6–20)
CO2: 26 mmol/L (ref 22–32)
Calcium: 8.2 mg/dL — ABNORMAL LOW (ref 8.9–10.3)
Chloride: 102 mmol/L (ref 98–111)
Creatinine, Ser: 1.32 mg/dL — ABNORMAL HIGH (ref 0.44–1.00)
GFR, Estimated: 50 mL/min — ABNORMAL LOW (ref >60)
Glucose, Bld: 147 mg/dL — ABNORMAL HIGH (ref 70–99)
Potassium: 4.2 mmol/L (ref 3.5–5.1)
Sodium: 136 mmol/L (ref 135–145)

## 2021-01-10 LAB — TYPE AND SCREEN
ABO/RH(D): O NEG
Antibody Screen: NEGATIVE
Unit division: 0
Unit division: 0

## 2021-01-10 LAB — HEPARIN LEVEL (UNFRACTIONATED): Heparin Unfractionated: <0.1 IU/mL — ABNORMAL LOW (ref 0.30–0.70)

## 2021-01-10 MED ORDER — ALBUMIN HUMAN 25 % IV SOLN
25.0000 g | Freq: Four times a day (QID) | INTRAVENOUS | Status: AC
  Administered 2021-01-10 – 2021-01-11 (×4): 25 g via INTRAVENOUS
  Filled 2021-01-10 (×4): qty 100

## 2021-01-10 MED ORDER — PIPERACILLIN-TAZOBACTAM 3.375 G IVPB
3.3750 g | Freq: Three times a day (TID) | INTRAVENOUS | Status: DC
  Administered 2021-01-10 – 2021-01-16 (×18): 3.375 g via INTRAVENOUS
  Filled 2021-01-10 (×19): qty 50

## 2021-01-10 MED ORDER — ADULT MULTIVITAMIN W/MINERALS CH
1.0000 | ORAL_TABLET | Freq: Every day | ORAL | Status: DC
  Administered 2021-01-10 – 2021-01-13 (×4): 1
  Filled 2021-01-10 (×4): qty 1

## 2021-01-10 MED ORDER — HALOPERIDOL LACTATE 5 MG/ML IJ SOLN
5.0000 mg | Freq: Four times a day (QID) | INTRAMUSCULAR | Status: DC | PRN
  Administered 2021-01-10 – 2021-01-13 (×2): 5 mg via INTRAVENOUS
  Filled 2021-01-10 (×2): qty 1

## 2021-01-10 MED ORDER — PIVOT 1.5 CAL PO LIQD
1000.0000 mL | ORAL | Status: DC
  Administered 2021-01-10 – 2021-01-11 (×2): 1000 mL
  Filled 2021-01-10 (×9): qty 1000

## 2021-01-10 MED ORDER — OXYCODONE HCL 5 MG/5ML PO SOLN
10.0000 mg | Freq: Four times a day (QID) | ORAL | Status: DC | PRN
  Administered 2021-01-10 – 2021-01-13 (×5): 10 mg
  Filled 2021-01-10 (×7): qty 10

## 2021-01-10 NOTE — Progress Notes (Signed)
Physical Therapy Evaluation Patient Details Name: [name] [name] MRN: [id number] DOB: Oct 23, 1970 Today's Date: 01/10/2021   History of Present Illness  50 y.o. 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 CCRT 6/3-6/14; extubated 6/12; fall out of bed 6/15 with (-) head CT. PMHx significant for DMII, anxiety and new onset A-fib.  Clinical Impression  Pt admitted with above diagnosis. Pt was able to transfer to 3N1 with min assist of 2 but required mod assist of 2 to transfer back to bed as pt fatigued.  Had to clean pt once back in bed as pt could not stand to be cleaned.  Feel that pt may need SNF for rehab prior to d/c home. Will follow acutely.  Pt currently with functional limitations due to the deficits listed below (see PT Problem List). Pt will benefit from skilled PT to increase their independence and safety with mobility to allow discharge to the venue listed below.       Follow Up Recommendations SNF    Equipment Recommendations  Other (comment) (TBA)    Recommendations for Other Services       Precautions / Restrictions Precautions Precautions: Fall Precaution Comments: High fall risk Restrictions Weight Bearing Restrictions: No      Mobility  Bed Mobility Overal bed mobility: Needs Assistance Bed Mobility: Sit to Supine;Sidelying to Sit   Sidelying to sit: HOB elevated;Mod assist;+2 for physical assistance   Sit to supine: Max assist;+2 for physical assistance   General bed mobility comments: Needed mod assist to come to eOB as pt not following cues for safety. Max assist to lie down due to fatigue after gettitng on 3N1.    Transfers Overall transfer level: Needs assistance Equipment used: 2 person hand held assist Transfers: Sit to/from  [employer] Sit to Stand: Min assist;Mod assist;+2 physical assistance Stand pivot transfers: Min assist;Mod assist;+2 physical assistance       General transfer comment: Pt needed min assist to stand from bed and transfer to 3n1.  Pt got very fatigued on 3N1 and needed mod assist to pivot back to bed with pt with flexed posture.  Ambulation/Gait             General Gait Details: unable due to fatigue after using 3N1  Stairs            Wheelchair Mobility    Modified Rankin (Stroke Patients Only)       Balance Overall balance assessment: Needs assistance Sitting-balance support: Bilateral upper extremity supported;Feet unsupported Sitting balance-Leahy Scale: Poor Sitting balance - Comments: Reliant on BUE to maintain static sitting balance at EOB.   Standing balance support: Single extremity supported;Bilateral upper extremity supported;During functional activity Standing balance-Leahy Scale: Poor Standing balance comment: Reliant on external assist to maintain static standing balance.                             Pertinent Vitals/Pain Pain Assessment: Faces Pain Score: 9  Faces Pain Scale: Hurts worst Pain Location: buttocks Pain Descriptors / Indicators: Aching;Sore;Throbbing Pain Intervention(s): Limited activity within patient's tolerance;Monitored during session;Repositioned;Premedicated before session    Home Living Family/patient expects to be discharged to:: Private residence Living Arrangements: Children (68 y.o. son) Available Help at Discharge: Other (Comment) (Unsure) Type of Home: Apartment (2nd floor)  Home Access: Stairs to enter   [employer] of Steps: Full flight Home Layout: One level Home Equipment: None Additional Comments: Patient is a questionable historian. Will need to confirm home set-up and family ability to assist post d/c.    Prior Function Level of Independence: Independent          Comments: I with ADLs/IADLs; drives     Hand Dominance        Extremity/Trunk Assessment   Upper Extremity Assessment Upper Extremity Assessment: Defer to OT evaluation    Lower Extremity Assessment Lower Extremity Assessment: Generalized weakness    Cervical / Trunk Assessment Cervical / Trunk Assessment: Other exceptions Cervical / Trunk Exceptions: Body habitus  Communication   Communication: Other (comment) (Tangential in speech)  Cognition Arousal/Alertness: Awake/alert;Lethargic (Lethargic initially) Behavior During Therapy: Impulsive Overall Cognitive Status: Impaired/Different from baseline Area of Impairment: Following commands;Safety/judgement;Awareness;Problem solving                       Following Commands: Follows one step commands consistently;Follows multi-step commands with increased time Safety/Judgement: Decreased awareness of safety Awareness: Emergent Problem Solving: Slow processing General Comments: Patient impulsive. Questionable historian. Lethargic initially but able to answer questions once seated EOB. Cues for safety. Decr safety as she fatigues      General Comments General comments (skin integrity, edema, etc.): O2 95% on 4LO2.  HR up to 150 bpm with activity from 130 bpm.  Pt anxious as well due to fatigue after use of 3N1    Exercises     Assessment/Plan    PT Assessment Patient needs continued PT services  PT Problem List Decreased activity tolerance;Decreased balance;Decreased mobility;Decreased knowledge of use of DME;Decreased safety awareness;Decreased knowledge of precautions;Cardiopulmonary status limiting activity       PT Treatment Interventions DME instruction;Gait training;Functional mobility training;Therapeutic activities;Therapeutic exercise;Balance training;Patient/family education    PT Goals (Current goals can be found in the Care Plan section)  Acute Rehab PT Goals Patient Stated Goal: To get  better. PT Goal Formulation: With patient Time For Goal Achievement: 01/24/21 Potential to Achieve Goals: Fair    Frequency Min 3X/week   Barriers to discharge Decreased caregiver support      Co-evaluation               AM-PAC PT "6 Clicks" Mobility  Outcome Measure Help needed turning from your back to your side while in a flat bed without using bedrails?: A Lot Help needed moving from lying on your back to sitting on the side of a flat bed without using bedrails?: A Lot Help needed moving to and from a bed to a chair (including a wheelchair)?: Total Help needed standing up from a chair using your arms (e.g., wheelchair or bedside chair)?: Total Help needed to walk in hospital room?: Total Help needed climbing 3-5 steps with a railing? : Total 6 Click Score: 8    End of Session Equipment Utilized During Treatment: Gait belt;Oxygen Activity Tolerance: Patient limited by fatigue Patient left: in bed;with call bell/phone within reach;with bed alarm set Nurse Communication: Mobility status;Need for lift equipment PT Visit Diagnosis: Unsteadiness on feet (R26.81);Muscle weakness (generalized) (M62.81)    Time: 9678-9381 PT Time Calculation (min) (ACUTE ONLY): 35 min   Charges:   PT Evaluation $PT Eval Moderate Complexity: 1 Mod PT Treatments $Therapeutic Activity: 8-22 mins        Jamiee Milholland M,PT Acute Rehab Services [phone] (pager)   [name] 01/10/2021, 2:19 PM

## 2021-01-10 NOTE — Progress Notes (Signed)
Nutrition Follow-up  DOCUMENTATION CODES:   Not applicable  INTERVENTION:   Continue tube feeding via Cortrak tube: Change to Pivot 1.5 at 65 ml/h (1560 ml per day) Discontinue Prosource TF  Provides 2340 kcal, 146 gm protein, 1170 ml free water daily.  D/C renal MVI.  Add MVI with minerals 1 tablet (crushed) via tube once daily.  Continue Magic cup TID with meals, each supplement provides 290 kcal and 9 grams of protein.  NUTRITION DIAGNOSIS:   Increased nutrient needs related to wound healing, acute illness (L buttock necrotizing fasciitis) as evidenced by estimated needs.  Ongoing  GOAL:   Patient will meet greater than or equal to 90% of their needs   Progressing  MONITOR:   Vent status, TF tolerance, Labs, Skin  REASON FOR ASSESSMENT:   Ventilator, Consult Enteral/tube feeding initiation and management  ASSESSMENT:   50 yo female admitted from [place] with worsening renal function, necrotizing soft tissue infection of left buttock. PMH includes Bartholin cyst, HTN, DM-2, GERD.   Discussed patient in ICU rounds and with RN today. She remains lethargic and oral intake is minimal.  No longer requiring hemodialysis. HD cath has been removed.  Receiving daily hydrotherapy to left buttock wound.  Cortrak in place: Vital 1.5 at 60 ml/h with Prosource TF 45 ml QID. Tolerating well. Will change TF formula to Pivot 1.5, which contains arginine and glutamine to promote wound healing.  Remains on a dysphagia 3 diet with nectar thick liquids.  Meal intakes: 0-5%  Labs reviewed.  CBG: [id number]  Medications reviewed and include colace, novolog, lantus, Rena-vit, protonix.  Weight 93.4 kg 6/14. Admission weight 104.3 kg  Rectal tube in place, 100 ml output x 24 hours  Diet Order:   Diet Order             DIET DYS 3 Room service appropriate? Yes with Assist; Fluid consistency: Nectar Thick  Diet effective now                   EDUCATION NEEDS:    Not appropriate for education at this time  Skin:  Skin Assessment: Skin Integrity Issues: Skin Integrity Issues:: Other (Comment) Other: L buttock soft tissue infection, S/P debridement  Last BM:  6/14 rectal tube, type 7  Height:   Ht Readings from Last 1 Encounters:  01/08/21 5\' 2"  (1.575 m)    Weight:   Wt Readings from Last 1 Encounters:  01/08/21 93.4 kg    Ideal Body Weight:  50 kg  BMI:  Body mass index is 37.66 kg/m.  Estimated Nutritional Needs:   Kcal:  2200-2500  Protein:  130-150 gm  Fluid:  >/= 2 L    01/10/21, RD, LDN, CNSC Please refer to Amion for contact information.

## 2021-01-10 NOTE — Progress Notes (Signed)
Assisted tele visit to patient with son.  Quenna Doepke P, RN  

## 2021-01-10 NOTE — Progress Notes (Signed)
  Speech Language Pathology Treatment: Dysphagia  Patient Details Name: [name] [name] MRN: [id number] DOB: 17-Mar-1971 Today's Date: 01/10/2021 Time: 1497-0263 SLP Time Calculation (min) (ACUTE ONLY): 19.4 min  Assessment / Plan / Recommendation Clinical Impression  Pt was seen for dysphagia treatment which was limited due to pt's refusal of consistencies due to pain. Pt was able to verbalize swallowing precautions without cues. She was noted to use individual sips once, but otherwise consistently demonstrated 2-3 consecutive swallows. No s/sx of aspiration were noted with thin (via cup) or nectar thick liquids (via straw), but silent aspiration was noted with thin liquids during the MBS and this cannot be ruled out at bedside. Pt refused all solids despite encouragement. Pt's RN was advised of pt's c/o pain and the session was terminated prematurely to allow pain management. SLP will continue to follow pt.    HPI HPI: 50 y/o female initially admitted to [facility] on 5/29 complaining of fever and a buttock abscess, moved to [facility] on 6/3 in the setting of worsening renal failure, acidosis, encephaloapthy with known group B strep wound culture. ETT 6/4-6/12, hx GERD, obesity.      SLP Plan  Continue with current plan of care       Recommendations  Diet recommendations: Dysphagia 3 (mechanical soft);Nectar-thick liquid Liquids provided via: Cup;Straw Medication Administration: Via alternative means Supervision: Staff to assist with self feeding Compensations: Slow rate;Small sips/bites;Clear throat intermittently;Minimize environmental distractions                Oral Care Recommendations: Oral care QID;Oral care prior to ice chip/H20 Follow up Recommendations: Other (comment) (TBD per PT/OT recommendations) SLP Visit Diagnosis: Dysphagia, oropharyngeal phase (R13.12) Plan: Continue with current plan of care       Charday Capetillo I. Vear Clock, MS, CCC-SLP Acute Rehabilitation  Services Office number [phone] Pager [phone]               Scheryl Marten 01/10/2021, 9:44 AM

## 2021-01-10 NOTE — Progress Notes (Addendum)
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  Intraoperative cultures with E. coli, GBS, and Prevotella.  No further surgical debridement planned by general surgery at this time.  Patient currently on piperacillin tazobactam. Acute kidney injury: Followed by nephrology.  Transitioned off CRRT and currently no dialytic needs anticipated.  Planning for discontinuing her nontunneled HD line today. Diabetes, obesity Atrial flutter with RVR and heart failure: Cardiology following earlier in the admission.  Possibly A. fib/RVR induced, however, will require ischemic evaluation at some point per cardiology.  AC currently on hold  PLAN:    Continue piperacillin tazobactam Continue wound care.  Discussed with surgery today regarding concerns for ability to heal wound if the area is not kept free of stool contamination and query whether she would be a candidate for colostomy given how close her wound is to the rectum Glycemic control Will follow   Principal Problem:   Necrotizing soft tissue infection Active Problems:   Septic shock (HCC)   Sepsis due to Streptococcus, group B (HCC)   Acute kidney injury (HCC)   Obesity   Type 2 diabetes mellitus with hyperglycemia (HCC)   Acute hypercapnic respiratory failure (HCC)   Typical atrial flutter (HCC)   Hyperkalemia   New onset a-fib (HCC)   Acute combined systolic and diastolic heart failure (HCC)   Acute metabolic encephalopathy   History of ETT   Encounter for central line placement   Encounter for orogastric (OG) tube placement    MEDICATIONS:     Scheduled Meds: . amiodarone  200 mg Per Tube Q1200  . Chlorhexidine Gluconate Cloth  6 each Topical Q0600  . clonazepam  1 mg Per Tube BID  . docusate  50 mg Per Tube Daily  . feeding supplement (PROSource TF)  45 mL Per Tube QID  . fentaNYL  1 patch Transdermal Q72H  . insulin aspart  0-20 Units Subcutaneous Q4H  . insulin aspart  3 Units Subcutaneous Q4H  . insulin glargine  10 Units Subcutaneous BID  . mouth rinse  15 mL Mouth Rinse BID  . multivitamin  1 tablet Per Tube QHS  . oxyCODONE  10 mg Per Tube Q6H  . pantoprazole sodium  40 mg Per Tube Daily  . QUEtiapine  50 mg Per Tube QHS  . sodium chloride flush  10-40 mL Intracatheter Q12H   Continuous Infusions: . sodium chloride 250 mL (01/07/21 1223)  . albumin human 25 g (01/10/21 0903)  . feeding supplement (VITAL 1.5 CAL) 1,000 mL (01/10/21 0539)  . piperacillin-tazobactam (ZOSYN)  IV 3.375 g (01/09/21 2117)   PRN Meds:.fentaNYL (SUBLIMAZE) injection, food thickener, levalbuterol, LORazepam, ondansetron (ZOFRAN) IV, polyethylene glycol, sodium chloride flush  SUBJECTIVE:   24 hour events:  No acute events noted overnight Creatinine is stable at 1.3 and she is making urine WBC 10.2  Patient is currently laying on her side due to pain related to her wound.  She is getting ready for hydrotherapy approximately in the next hour.  She has no fevers or chills.  She is tolerating Zosyn.  Review of Systems  All other systems reviewed  and are negative.    OBJECTIVE:   Blood pressure 109/89, pulse (!) 130, temperature 99.6 F (37.6 C), temperature source Oral, resp. rate 20, height 5\' 2"  (1.575 m), weight 93.4 kg, SpO2 100 %. Body mass index is 37.66 kg/m.  Physical Exam Constitutional:      General: She is not in acute distress.    Appearance: Normal appearance. She is obese.  HENT:     Head: Normocephalic and atraumatic.  Eyes:     Extraocular Movements: Extraocular movements intact.     Conjunctiva/sclera:  Conjunctivae normal.  Cardiovascular:     Rate and Rhythm: Tachycardia present.  Pulmonary:     Effort: Pulmonary effort is normal. No respiratory distress.  Abdominal:     General: There is no distension.     Palpations: Abdomen is soft.     Tenderness: There is no abdominal tenderness.  Musculoskeletal:     Comments: Images of wound reviewed in the media tab  Neurological:     General: No focal deficit present.     Mental Status: She is alert and oriented to person, place, and time.  Psychiatric:        Mood and Affect: Mood normal.        Behavior: Behavior normal.     Lab Results: Lab Results  Component Value Date   WBC 10.2 01/10/2021   HGB 7.7 (L) 01/10/2021   HCT 24.2 (L) 01/10/2021   MCV 100.4 (H) 01/10/2021   PLT 336 01/10/2021    Lab Results  Component Value Date   NA 136 01/10/2021   K 4.2 01/10/2021   CO2 26 01/10/2021   GLUCOSE 147 (H) 01/10/2021   BUN 29 (H) 01/10/2021   CREATININE 1.32 (H) 01/10/2021   CALCIUM 8.2 (L) 01/10/2021   GFRNONAA 50 (L) 01/10/2021    Lab Results  Component Value Date   ALT 20 01/04/2021   AST 34 01/04/2021   ALKPHOS 374 (H) 01/04/2021   BILITOT 1.4 (H) 01/04/2021    No results found for: CRP  No results found for: ESRSEDRATE   I have reviewed the micro and lab results in Epic.  Imaging: CT HEAD WO CONTRAST  Result Date: 01/09/2021 CLINICAL DATA:  Pain following fall EXAM: CT HEAD WITHOUT CONTRAST TECHNIQUE: Contiguous axial images were obtained from the base of the skull through the vertex without intravenous contrast. COMPARISON:  October 10, 2007 FINDINGS: Brain: Ventricles and sulci are normal in size and configuration. There is no intracranial mass, hemorrhage, extra-axial fluid collection, or midline shift. Brain parenchyma appears unremarkable. No appreciable acute infarct. Vascular: No hyperdense vessel. Mild calcification in the carotid siphon regions. Skull: Bony calvarium appears intact. Sinuses/Orbits:  Paranasal sinuses are clear. Orbits appear symmetric bilaterally. Other: Mastoid air cells clear. IMPRESSION: Normal appearing brain parenchyma. No mass, hemorrhage, extra-axial fluid collection. Mild arterial vascular calcification noted. Electronically Signed   By: Bretta BangWilliam  Woodruff III M.D.   On: 01/09/2021 08:51   DG Swallowing Func-Speech Pathology  Result Date: 01/08/2021 Formatting of this result is different from the original. Objective Swallowing Evaluation: Type of Study: MBS-Modified Barium Swallow Study  Patient Details Name: Eulis ManlyMelissa Ortmann MRN: [id number] Date of Birth: 1970/08/27 Today's Date: 01/08/2021 Time: SLP Start Time (ACUTE ONLY): 1008 -SLP Stop Time (ACUTE ONLY): 1020 SLP Time Calculation (min) (ACUTE ONLY): 12 min Past Medical History: Past Medical History: Diagnosis Date . Depression  . DM (diabetes mellitus) (HCC)  . GERD (gastroesophageal reflux disease)  . HLD (hyperlipidemia)  . Obesity  .  Tobacco abuse  Past Surgical History: Past Surgical History: Procedure Laterality Date . ABCESS DRAINAGE  11/2020  bartholin gland cyst . INCISION AND DRAINAGE ABSCESS Left 12/31/2020  Procedure: INCISION AND DRAINAGE ABSCESS BUTTOCK;  Surgeon: Axel Filler, MD;  Location: [facility] OR;  Service: General;  Laterality: Left; . IRRIGATION AND DEBRIDEMENT BUTTOCKS Left 12/28/2020  Procedure: DEBRIDEMENT LEFT BUTTOCK NECROTIZING SOFT TISSUE INFECTION - TOTAL TISSUE VOLUME 14CM X 12CM X 3CM DEEP;  Surgeon: Violeta Gelinas, MD;  Location: [facility] OR;  Service: General;  Laterality: Left; . WOUND DEBRIDEMENT Left 01/03/2021  Procedure: IRRIGATION AND DEBRIDEMENT LEFT BUTTOCK;  Surgeon: Axel Filler, MD;  Location: [facility] OR;  Service: General;  Laterality: Left; HPI: 50 y/o female initially admitted to [facility] on 5/29 complaining of fever and a buttock abscess, moved to [facility] on 6/3 in the setting of worsening renal failure, acidosis, encephaloapthy with known group B strep wound culture. ETT 6/4-6/12, hx GERD, obesity.   Subjective: alert, upright in chair for procedure Assessment / Plan / Recommendation CHL IP CLINICAL IMPRESSIONS 01/08/2021 Clinical Impression Pt presents with a mild oral and mild to moderate pharyngeal dysphagia, likely from deconditioning and prolonged intubation. Pt with mild oral residuals with solids and thicker POs, clearing with second swallows. Prolonged mastication noted with solids (pt states at baseline consuming regular diet, despite edentulous status). Pharyngeally pt with reduced arytenoid to base of epiglottic contact allowing for during the swallow laryngeal penetration of thin liquids (PAS-3) and episodic silent aspiration (PAS-8) with thins during larger consecutive swallows including cup and straw use. Airway protection was intact with remaining POs. Mild vallecular residuals noted with puree and mechanical soft textures due to reduced pharyngeal constriction and decreased base of tongue retraction. Recommend dysphagia 3 (mechanical soft) and nectar thick liquids with meds whole in puree. SLP to follow up for diet tolerance and advancement as pt tolerates.  SLP Visit Diagnosis Dysphagia, oropharyngeal phase (R13.12) Attention and concentration deficit following -- Frontal lobe and executive function deficit following -- Impact on safety and function Mild aspiration risk;Moderate aspiration risk   CHL IP TREATMENT RECOMMENDATION 01/08/2021 Treatment Recommendations Therapy as outlined in treatment plan below   Prognosis 01/08/2021 Prognosis for Safe Diet Advancement Good Barriers to Reach Goals Time post onset Barriers/Prognosis Comment -- CHL IP DIET RECOMMENDATION 01/08/2021 SLP Diet Recommendations Nectar thick liquid;Dysphagia 3 (Mech soft) solids Liquid Administration via Cup;Straw Medication Administration Whole meds with puree Compensations Slow rate;Small sips/bites;Clear throat intermittently;Minimize environmental distractions Postural Changes Seated upright at 90 degrees;Remain  semi-upright after after feeds/meals (Comment)   CHL IP OTHER RECOMMENDATIONS 01/08/2021 Recommended Consults -- Oral Care Recommendations Oral care BID Other Recommendations --   CHL IP FOLLOW UP RECOMMENDATIONS 01/08/2021 Follow up Recommendations Other (comment)   CHL IP FREQUENCY AND DURATION 01/08/2021 Speech Therapy Frequency (ACUTE ONLY) min 2x/week Treatment Duration 2 weeks      CHL IP ORAL PHASE 01/08/2021 Oral Phase Impaired Oral - Pudding Teaspoon -- Oral - Pudding Cup -- Oral - Honey Teaspoon -- Oral - Honey Cup Delayed oral transit;Lingual/palatal residue Oral - Nectar Teaspoon -- Oral - Nectar Cup Lingual/palatal residue Oral - Nectar Straw -- Oral - Thin Teaspoon -- Oral - Thin Cup Lingual/palatal residue Oral - Thin Straw -- Oral - Puree Lingual/palatal residue Oral - Mech Soft Delayed oral transit;Decreased bolus cohesion;Lingual/palatal residue Oral - Regular -- Oral - Multi-Consistency -- Oral - Pill -- Oral Phase - Comment --  CHL IP PHARYNGEAL PHASE 01/08/2021 Pharyngeal Phase Impaired Pharyngeal- Pudding Teaspoon -- Pharyngeal --  Pharyngeal- Pudding Cup -- Pharyngeal -- Pharyngeal- Honey Teaspoon -- Pharyngeal -- Pharyngeal- Honey Cup Delayed swallow initiation-pyriform sinuses Pharyngeal -- Pharyngeal- Nectar Teaspoon -- Pharyngeal -- Pharyngeal- Nectar Cup Delayed swallow initiation-pyriform sinuses;Reduced tongue base retraction;Reduced pharyngeal peristalsis;Pharyngeal residue - valleculae Pharyngeal -- Pharyngeal- Nectar Straw -- Pharyngeal -- Pharyngeal- Thin Teaspoon -- Pharyngeal -- Pharyngeal- Thin Cup Delayed swallow initiation-pyriform sinuses;Penetration/Aspiration during swallow;Reduced airway/laryngeal closure;Reduced epiglottic inversion;Reduced pharyngeal peristalsis;Trace aspiration Pharyngeal Material enters airway, remains ABOVE vocal cords and not ejected out;Material enters airway, passes BELOW cords without attempt by patient to eject out (silent aspiration) Pharyngeal- Thin  Straw Penetration/Aspiration during swallow;Delayed swallow initiation-pyriform sinuses;Reduced epiglottic inversion;Reduced airway/laryngeal closure Pharyngeal Material enters airway, remains ABOVE vocal cords and not ejected out;Material enters airway, passes BELOW cords without attempt by patient to eject out (silent aspiration) Pharyngeal- Puree Delayed swallow initiation-vallecula Pharyngeal -- Pharyngeal- Mechanical Soft Delayed swallow initiation-vallecula;Reduced pharyngeal peristalsis;Reduced tongue base retraction;Pharyngeal residue - valleculae Pharyngeal -- Pharyngeal- Regular -- Pharyngeal -- Pharyngeal- Multi-consistency -- Pharyngeal -- Pharyngeal- Pill -- Pharyngeal -- Pharyngeal Comment --  CHL IP CERVICAL ESOPHAGEAL PHASE 01/08/2021 Cervical Esophageal Phase WFL Pudding Teaspoon -- Pudding Cup -- Honey Teaspoon -- Honey Cup -- Nectar Teaspoon -- Nectar Cup -- Nectar Straw -- Thin Teaspoon -- Thin Cup -- Thin Straw -- Puree -- Mechanical Soft -- Regular -- Multi-consistency -- Pill -- Cervical Esophageal Comment -- Chelsea E Hartness MA, CCC-SLP 01/08/2021, 10:36 AM                Imaging independently reviewed in Epic.    Vedia Coffer for Infectious Disease [facility] [phone] pager 01/10/2021, 9:47 AM

## 2021-01-10 NOTE — Evaluation (Signed)
Occupational Therapy Evaluation Patient Details Name: [name] [name] MRN: [id number] DOB: January 17, 1971 Today's Date: 01/10/2021    History of Present Illness 50 y.o. female presenting from [facility] on 6/3 with encephalopathy, polymicrobial necrotizing skin and soft tissue infection of the left buttock/posterior thigh with TSS s/p debridement 6/3 and 6/6, septic shock, heart failure possibly A-fib with RVR induced and AKI. CCRT 6/3-6/14; extubated 6/12; fall out of bed 6/15 with (-) head CT. PMHx significant for DMII, anxiety and new onset A-fib.   Clinical Impression   Patient is a questionable historian. PTA patient was living with her 47 y.o. son in a 2nd floor apartment with full flight of STE and was grossly I with ADLs/IADLs without AD. Patient currently functioning below baseline demonstrating observed ADLs with Mod A overall. Patient also limited by deficits listed below including decreased cognition, decreased safety awareness, generalized weakness, and decreased activity tolerance and would benefit from continued acute OT services in prep for safe d/c to next level of care with recommendation for SNF rehab pending patient progress. Will continue to assess families ability to provide supervision/assist post d/c.      Follow Up Recommendations  SNF;Supervision/Assistance - 24 hour    Equipment Recommendations  Other (comment) (TBD)    Recommendations for Other Services       Precautions / Restrictions Precautions Precautions: Fall Precaution Comments: High fall risk Restrictions Weight Bearing Restrictions: No      Mobility Bed Mobility Overal bed mobility: Needs Assistance Bed Mobility: Sit to Supine;Sidelying to Sit   Sidelying to sit: Min guard;HOB elevated   Sit to supine: Min assist;HOB elevated   General bed mobility comments: Min guard for sidelying to sit EOB with increased time/effort. Patient expressed fear of falling. Min A at BLE for return to supine  +bedrail.    Transfers Overall transfer level: Needs assistance Equipment used: 1 person hand held assist Transfers: Sit to/from Stand;Lateral/Scoot Transfers Sit to Stand: Min assist         General transfer comment: Min A for sit to stand from EOB and Min A for lateral steps towrad HOB with increased time.    Balance Overall balance assessment: Needs assistance Sitting-balance support: Bilateral upper extremity supported;Feet unsupported Sitting balance-Leahy Scale: Fair Sitting balance - Comments: Reliant on BUE to maintain static sitting balance at EOB.   Standing balance support: Single extremity supported;Bilateral upper extremity supported;During functional activity Standing balance-Leahy Scale: Poor Standing balance comment: Reliant on external assist to maintain static standing balance.                           ADL either performed or assessed with clinical judgement   ADL Overall ADL's : Needs assistance/impaired     Grooming: Set up;Sitting Grooming Details (indicate cue type and reason): 2/3 grooming tasks with bed in chair position.             Lower Body Dressing: Minimal assistance;Bed level Lower Body Dressing Details (indicate cue type and reason): Min A to don footwear with bed in chair position. Would likely require +2 assist to don LB clothing in sitting/standing.               General ADL Comments: Patient limited by decreased cognition, decreased activity tolerance, generalized weakness and pain.     Vision Patient Visual Report: No change from baseline       Perception     Praxis      Pertinent Vitals/Pain  Pain Assessment: 0-10 Pain Score: 9  Pain Location: buttocks Pain Descriptors / Indicators: Aching;Sore;Throbbing Pain Intervention(s): Limited activity within patient's tolerance;Monitored during session;Premedicated before session;Repositioned     Hand Dominance     Extremity/Trunk Assessment Upper Extremity  Assessment Upper Extremity Assessment: Overall WFL for tasks assessed   Lower Extremity Assessment Lower Extremity Assessment: Defer to PT evaluation   Cervical / Trunk Assessment Cervical / Trunk Assessment: Other exceptions Cervical / Trunk Exceptions: Body habitus   Communication Communication Communication: Other (comment) (Tangential in speech)   Cognition Arousal/Alertness: Awake/alert;Lethargic (Lethargic initially) Behavior During Therapy: Impulsive Overall Cognitive Status: Impaired/Different from baseline Area of Impairment: Following commands;Safety/judgement;Awareness;Problem solving                       Following Commands: Follows one step commands consistently;Follows multi-step commands with increased time Safety/Judgement: Decreased awareness of safety Awareness: Emergent Problem Solving: Slow processing General Comments: Patient impulsive. Questionable historian. Lethargic initially but able to answer questions once seated EOB. Cues for safety.   General Comments  VSS on 2L O2 via [place].    Exercises     Shoulder Instructions      Home Living Family/patient expects to be discharged to:: Private residence Living Arrangements: Children (30 y.o. son) Available Help at Discharge: Other (Comment) (Unsure) Type of Home: Apartment (2nd floor) Home Access: Stairs to enter Entrance Stairs-Number of Steps: Full flight   Home Layout: One level         Bathroom Toilet: Standard     Home Equipment: None   Additional Comments: Patient is a questionable historian. Will need to confirm home set-up and family ability to assist post d/c.      Prior Functioning/Environment Level of Independence: Independent        Comments: I with ADLs/IADLs; drives        OT Problem List: Decreased strength;Decreased activity tolerance;Impaired balance (sitting and/or standing);Decreased cognition;Decreased safety awareness;Decreased knowledge of use of DME or  AE;Cardiopulmonary status limiting activity;Obesity;Pain      OT Treatment/Interventions: Self-care/ADL training;Therapeutic exercise;Energy conservation;DME and/or AE instruction;Therapeutic activities;Cognitive remediation/compensation;Patient/family education;Balance training    OT Goals(Current goals can be found in the care plan section) Acute Rehab OT Goals Patient Stated Goal: To get better. OT Goal Formulation: With patient Time For Goal Achievement: 01/24/21 Potential to Achieve Goals: Good ADL Goals Pt Will Perform Grooming: with supervision;standing Pt Will Perform Upper Body Dressing: with set-up;sitting Pt Will Perform Lower Body Dressing: with set-up;sit to/from stand Pt Will Transfer to Toilet: with supervision;ambulating Pt Will Perform Toileting - Clothing Manipulation and hygiene: with supervision;sit to/from stand Additional ADL Goal #1: Patient will score less than 5/28 on SBT indicating improved cognition.  OT Frequency: Min 2X/week   Barriers to D/C: Decreased caregiver support  Lives with 18y.o. son.       Co-evaluation              AM-PAC OT "6 Clicks" Daily Activity     Outcome Measure Help from another person eating meals?: None Help from another person taking care of personal grooming?: A Little Help from another person toileting, which includes using toliet, bedpan, or urinal?: Total Help from another person bathing (including washing, rinsing, drying)?: A Lot Help from another person to put on and taking off regular upper body clothing?: A Little Help from another person to put on and taking off regular lower body clothing?: A Lot 6 Click Score: 15   End of Session Equipment Utilized During Treatment: Gait  belt;Oxygen Nurse Communication: Mobility status;Other (comment) (Response to treatment)  Activity Tolerance: Patient tolerated treatment well;Patient limited by pain Patient left: in bed;with call bell/phone within reach;with bed alarm  set  OT Visit Diagnosis: Unsteadiness on feet (R26.81);Muscle weakness (generalized) (M62.81);History of falling (Z91.81)                Time: 1610-9604 OT Time Calculation (min): 23 min Charges:  OT General Charges $OT Visit: 1 Visit OT Evaluation $OT Eval Moderate Complexity: 1 Mod OT Treatments $Self Care/Home Management : 8-22 mins  Sterling Mondo H. OTR/L Supplemental OT, Department of rehab services [phone]  Kurstin Dimarzo R H. 01/10/2021, 12:30 PM

## 2021-01-10 NOTE — Progress Notes (Signed)
NAME:  [name] [name], MRN:  [id number], DOB:  October 26, 1970, LOS: 13 ADMISSION DATE:  12/28/2020, CONSULTATION DATE:  6/3 REFERRING MD:  [facility] MD, CHIEF COMPLAINT:  Fever, pain and ulceration  History of Present Illness:  50 y/o female initially admitted to [place] on 5/29 complaining of fever and a buttock abscess, moved to [facility] on 6/3 in the setting of worsening renal failure, acidosis, encephaloapthy with known group B strep wound culture.  Pertinent  Medical History  Recent Vulvar irritation and Bartholin Cyst seen at [facility]  HTN DM type II Cholecystectomy  GERD Anxiety  Significant Hospital Events:  5/29 admitted to [place] sepsis felt 2/2 cellulitis on buttocks. Cultures sent. CT abd/pelvis + [place] gas but no drainable abscess. Started on Vanc. IVFs. Wound also cultured.  6/1 wound culture back + group B strep. 1 of 4 blood cultures + GPC. ABX changed to rocephin 6/3 Arrived at [facility] from [place] w/ new hypotension, worsening renal failure, worsening leukocytosis, poorly controlled pain and new encephalopathy. Admitted to ICU. Started on zyvox and meropenem. Surgical consult requested> to OR for debridement 6/3 started CRRT, meropenem and Linezolid added 6/4 IVIg >6/6 6/5 Meropenem stopped 6/6 zosyn added  6/6 back to OR for debridement, in and out of atrial flutter; QTc 590; cardiology consult> amiodarone 6/7 off vasopressors, continues to have atrial flutter, started ketamine, Linezolid stopped 6/8 started on metoprolol, echo with LVEF 45-50%.  6/9 OR debridement  6/11 Fever curve improved, remains on CRRT  6/12 extubated 6/13 remains on CRRT 6/14 CRRT stopped, continues to have pain and agitation issues 6/15 agitated, fell out of bed, reported that she hit her head, head CT neg 6/16 much better with psych med changes  Interim History / Subjective:  No events. More oriented.  Objective   Blood pressure 109/89, pulse (!) 130, temperature 99.6 F (37.6 C),  temperature source Oral, resp. rate 20, height 5\' 2"  (1.575 m), weight 93.4 kg, SpO2 100 %.        Intake/Output Summary (Last 24 hours) at 01/10/2021 0802 Last data filed at 01/10/2021 0600 Gross per 24 hour  Intake 2280 ml  Output 1000 ml  Net 1280 ml    Filed Weights   01/07/21 0459 01/08/21 0400 01/08/21 0800  Weight: 99.2 kg 93.4 kg 93.4 kg    Examination: Constitutional: no acute distress, resting comfortably in bed Eyes: EOMI, pupils equal Ears, nose, mouth, and throat: MM dry, trachea midline Cardiovascular: tachycardic, ext warm Respiratory: diminished at bases, no accessory muscle use Gastrointestinal: soft, +BS Skin: No rashes, normal turgor, defer wound exam to CCS Neurologic: moves all 4 ext to command Psychiatric: more appropriate today   Labs/imaging that I havepersonally reviewed   Making urine! Cr stable 1 unit pRBC 6/15, Hgb 6.6>>7.7  Resolved Hospital Problem list     Assessment & Plan:   Septic shock due to polymicrobial necrotizing soft tissue infection- improved, ongoing vasoplegia with attempts to pull fluid;  Septic ATN, hyperkalemia- off CRRT 6/14 Afib, new onset in setting of sepsis- AC on hold after fall ABLA in setting of surgery- last transfused 6/15, question bone marrow stunning plus ongoing wound losses Heart failure question Afib/RVR induced- think she is actually a bit dry; looks like sinus on monitor Acute respiratory failure with hypoxemia- improved Baseline narcotic and benzo dependence Ongoing ICU delirium- is a problem DM2 with hyperglycemia- remains issue 6/15 fall with hitting head- neuro exam remains nonfocal; okay to resume AC from this standpoint but need to see  H/H stable  - Basal bolus insulin as ordered - Albumin 100g 25%, hold midodrine - continue to hold heparin until not being transfusion dependent then can consider rechallenge - 2 weeks zosyn after 6/9 (presumed source control): 6/23; ID following - Continue  fentanyl patch with PRNs, benzos and seroquel as ordered - PT/OT/SLP input appreciated - Continue rectal tube - HD catheter can probably come out if nephro okay with it  Should be okay to leave unit, remaining issues: > Hydrotherapy for wound > Monitoring for need for HD (maybe can avoid long term!) > Making sure H/H stable then rechallenging with AC > If continues to be in afib (check EKG again today), cardiology would like to cardiovert down the line > Severe muscular deconditioning > Dysphagia with cortrak in place > ICU delirium on top of baseline anxiety/depression  Best practice   Diet:  Tube Feed  Pain/Anxiety/Delirium protocol (if indicated): No VAP protocol (if indicated): No DVT prophylaxis: off GI prophylaxis: H2B Glucose control:  SSI Yes Central venous access:  Yes, and it is still needed Arterial line:  out Foley:  out Mobility:  bed rest  PT consulted: Yes Last date of multidisciplinary goals of care discussion called sister Tona Sensing [phone] and updated 01/10/21, all questions answered Code Status:  full code Disposition: med tele  Myrla Halsted MD PCCM

## 2021-01-10 NOTE — Progress Notes (Signed)
PHARMACY NOTE:  ANTIMICROBIAL RENAL DOSAGE ADJUSTMENT  Current antimicrobial regimen includes a mismatch between antimicrobial dosage and estimated renal function.  As per policy approved by the Pharmacy & Therapeutics and Medical Executive Committees, the antimicrobial dosage will be adjusted accordingly.  Current antimicrobial dosage:  Zosyn 3.375 gm IV q12 hrs  Indication: Wound infection  Renal Function:  Estimated Creatinine Clearance: 56.1 mL/min (A) (by C-G formula based on SCr of 1.32 mg/dL (H)). []      On intermittent HD, scheduled: []      On CRRT    Antimicrobial dosage has been changed to:  Zosyn 3.375 gm IV q8 hrs  Additional comments: Off CRRT, now making good UOP almost 1L/24 hrs yesterday. CrCl likely >20 ml/min with improved UOP. Adjusting dose accordingly.   , PharmD, BCPS PGY2 Cardiology Pharmacy Resident Phone: [phone] 01/10/2021  3:29 PM  Please check [email or web address] for unit-specific pharmacy phone numbers.

## 2021-01-10 NOTE — Progress Notes (Signed)
  [facility] Progress Note   Assessment/ Plan:    AKI - presumably ischemic ATN in setting of severe sepsis syndrome and hypotension requiring multiple pressors.  She was started on CRRT 12/28/20 RIJ HD catheter placed by PCCM.  Volume status better, off pressors.  CRRT stopped 01/08/21.  Cr stable 6/15-6/16 at 1.3 and UOP adequate.  Does not need further dialytic intervention.  Will d/c HD cath today and will sign off.  Call with questions.   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 : s/p debridement on 12/28/20, 12/31/20, and 01/03/21, surgery following and no more plans for OR as of now Hyperkalemia - improved with resumption of CRRT as above. VDRF - per PCCM Atrial flutter with RVR:  Cardiology following.  On amiodarone and off levophed. DM type 2 - per primary Acute metabolic encephalopathy Anemia of critical illness - transfuse prn. Hypophosphatemia - repleted IV and follow.  Fall- Ct head negative 01/09/21 Dispo: in ICU  Subjective:    Cr stable at 1.3.  Making urine- 900 mL charted yesterday with additional incontinent episodes.   Objective:   BP 109/89   Pulse (!) 130   Temp 99.6 F (37.6 C) (Oral)   Resp 20   Ht 5\' 2"  (1.575 m)   Wt 93.4 kg   SpO2 100%   BMI 37.66 kg/m   Physical Exam: Gen: appears upset this AM, getting EKG CVS: tachycardic Resp: clear Abd: soft GU: flexiseal in place Ext: trace LE edema ACCESS: R IJ nontunneled HD catheter  Labs: BMET Recent Labs  Lab 01/06/21 0847 01/06/21 1547 01/07/21 0030 01/07/21 0742 01/07/21 1600 01/08/21 0153 01/09/21 0856 01/10/21 0300  NA 134* 134* 135 134* 134* 132* 134* 136  K 4.2 4.5 4.0 3.9 4.4 4.3 4.0 4.2  CL 102 103 101 101 100 99 101 102  CO2 28 23 25 28 27 24 24 26   GLUCOSE 190* 189* 132* 94 139* 166* 257* 147*  BUN 22* 23* 19 17 15 15  24* 29*  CREATININE 1.05* 1.09* 0.95 0.86 0.87 0.95 1.35* 1.32*  CALCIUM 7.7* 7.8* 7.7* 7.7* 8.0* 8.3* 8.0* 8.2*  PHOS 3.0 3.4 3.1 2.9 2.9 2.3*  4.4  --    CBC Recent Labs  Lab 01/07/21 1600 01/08/21 0153 01/09/21 0856 01/09/21 1830 01/10/21 0300  WBC 10.2 10.6* 11.0*  --  10.2  HGB 7.5* 7.6* 6.6* 7.8* 7.7*  HCT 22.9* 23.4* 21.0* 23.5* 24.2*  MCV 97.9 97.9 101.4*  --  100.4*  PLT 209 253 316  --  336      Medications:     amiodarone  200 mg Per Tube Q1200   Chlorhexidine Gluconate Cloth  6 each Topical Q0600   clonazepam  1 mg Per Tube BID   docusate  50 mg Per Tube Daily   feeding supplement (PROSource TF)  45 mL Per Tube QID   fentaNYL  1 patch Transdermal Q72H   insulin aspart  0-20 Units Subcutaneous Q4H   insulin aspart  3 Units Subcutaneous Q4H   insulin glargine  10 Units Subcutaneous BID   mouth rinse  15 mL Mouth Rinse BID   multivitamin  1 tablet Per Tube QHS   oxyCODONE  10 mg Per Tube Q6H   pantoprazole sodium  40 mg Per Tube Daily   QUEtiapine  50 mg Per Tube QHS   sodium chloride flush  10-40 mL Intracatheter Q12H    01/11/21 MD 01/10/2021, 9:19 AM

## 2021-01-10 NOTE — Progress Notes (Signed)
eLink Physician-Brief Progress Note Patient Name: [name] [name] DOB: [birth year] MRN: [id number]   Date of Service  01/10/2021  HPI/Events of Note  Nursing request to renew order for Telesitter.  eICU Interventions  Will renew order for Telesitter X 12 hours.      Intervention Category Major Interventions: Delirium, psychosis, severe agitation - evaluation and management  [name] [name] 01/10/2021, 4:21 AM

## 2021-01-10 NOTE — Progress Notes (Addendum)
ANTICOAGULATION CONSULT NOTE  Pharmacy Consult for  IV Heparin Indication: atrial fibrillation/flutter  No Known Allergies  Patient Measurements: Height: 5\' 2"  (157.5 cm) Weight: 93.4 kg (205 lb 14.6 oz) IBW/kg (Calculated) : 50.1 Heparin Dosing Weight: 71.9 kg  Vital Signs: Temp: 99.6 F (37.6 C) (06/16 0700) Temp Source: Oral (06/16 0700) BP: 109/89 (06/16 0600) Pulse Rate: 130 (06/16 0600)  Labs: Recent Labs    01/08/21 0153 01/08/21 1700 01/09/21 0130 01/09/21 0856 01/09/21 1830 01/10/21 0300 01/10/21 0301  HGB 7.6*  --   --  6.6* 7.8* 7.7*  --   HCT 23.4*  --   --  21.0* 23.5* 24.2*  --   PLT 253  --   --  316  --  336  --   HEPARINUNFRC  --  <0.10* 0.13*  --   --   --  <0.10*  CREATININE 0.95  --   --  1.35*  --  1.32*  --      Estimated Creatinine Clearance: 56.1 mL/min (A) (by C-G formula based on SCr of 1.32 mg/dL (H)).   Medical History: Past Medical History:  Diagnosis Date   Depression    DM (diabetes mellitus) (HCC)    GERD (gastroesophageal reflux disease)    HLD (hyperlipidemia)    Obesity    Tobacco abuse     Assessment: 50 yo female with wound infection, s/p multiple I&Ds and started on CRRT. Had new atrial fibrillation/flutter this admission and was started on IV amiodarone, which has since been switched to PO amiodarone. She was not started on systemic anticoagulation given I&Ds in the OR; last I&D 6/9 with no future procedures planned. Pt was on subcutaneous heparin for DVT prophylaxis, with last dose 6/14 at ~0530. Pharmacy was asked to transition to IV heparin.   Heparin held yesterday after falling and hitting her head. CT negative for bleed. 1u PRBC given yesterday, Hgb responded appropriately. No overt bleeding noted. After discussing with CCM, will continue to hold heparin for now and reassess CBC tomorrow.  Goal of Therapy:  Heparin level 0.3-0.7 units/ml Monitor platelets by anticoagulation protocol: Yes   Plan:  Continue to  hold heparin and reassess tomorrow Monitor CBC and s/sx bleeding  7/14, PharmD, BCPS PGY2 Cardiology Pharmacy Resident Phone: [phone] 01/10/2021  8:02 AM  Please check [email or web address] for unit-specific pharmacy phone numbers.

## 2021-01-10 NOTE — Progress Notes (Signed)
Physical Therapy Wound Treatment Patient Details  Name: [name] [name] MRN: [id number] Date of Birth: [birth year]  Today's Date: 01/10/2021 Time: 1115-1203 Time Calculation (min): 48 min  Subjective  Subjective Assessment Subjective: Pleasant and agreeable, asking for water. Patient and Family Stated Goals: None stated Date of Onset:  (Unsure) Prior Treatments: I&D 6/9  Pain Score:  Faces pain scale 6/10 at times; premedicated with IV pain meds.   Wound Assessment  Wound / Incision (Open or Dehisced) 12/28/20 Buttocks Left cyst- reddened, swollen area with brown drainage coming from left lower buttocks (Active)  Wound Image   01/10/21 1224  Dressing Type ABD;Barrier Film (skin prep);Gauze (Comment);Moist to moist;Normal saline moist dressing 01/10/21 1224  Dressing Changed Changed 01/10/21 1224  Dressing Status Clean;Dry;Intact 01/10/21 1224  Dressing Change Frequency Daily 01/10/21 1224  Site / Wound Assessment Yellow;Red;Black 01/10/21 1224  % Wound base Red or Granulating 55% 01/10/21 1224  % Wound base Yellow/Fibrinous Exudate 35% 01/10/21 1224  % Wound base Black/Eschar 10% 01/10/21 1224  % Wound base Other/Granulation Tissue (Comment) 0% 01/10/21 1224  Peri-wound Assessment Intact 01/10/21 1224  Wound Length (cm) 20 cm 01/08/21 1300  Wound Width (cm) 20 cm 01/08/21 1300  Wound Depth (cm) 4.8 cm 01/08/21 1300  Wound Volume (cm^3) 1920 cm^3 01/08/21 1300  Wound Surface Area (cm^2) 400 cm^2 01/08/21 1300  Tunneling (cm) 9 cm tunnel at 6:00 01/08/21 1300  Undermining (cm) up to 3 cm from 9:00 to 12:00 01/08/21 1300  Margins Unattached edges (unapproximated) 01/10/21 1224  Closure None 01/10/21 1224  Drainage Amount Minimal 01/10/21 1224  Drainage Description Serosanguineous 01/10/21 1224  Non-staged Wound Description Full thickness 01/10/21 1224  Treatment Debridement (Selective);Hydrotherapy (Pulse lavage);Packing (Saline gauze) 01/10/21 1224   Hydrotherapy Pulsed lavage  therapy - wound location: L buttock Pulsed Lavage with Suction (psi): 8 psi Pulsed Lavage with Suction - Normal Saline Used: 1000 mL Pulsed Lavage Tip: Tip with splash shield Selective Debridement Selective Debridement - Location: L buttock Selective Debridement - Tools Used: Forceps, Scissors Selective Debridement - Tissue Removed: Loose yellow slough and more tightly adhered black necrotic tissue    Wound Assessment and Plan  Wound Therapy - Assess/Plan/Recommendations Wound Therapy - Clinical Statement: Progressing with debridement. Pt tolerated well overall however complaining of more pain today. Will continue to follow for selective removal of unviable tissue, to decrease bioburden and promote wound bed healing. Wound Therapy - Functional Problem List: Decreased tolerance for OOB to chair and position changes due to pain and decreased skin integrity. Factors Delaying/Impairing Wound Healing: Infection - systemic/local, Incontinence Hydrotherapy Plan: Debridement, Dressing change, Patient/family education, Pulsatile lavage with suction Wound Therapy - Frequency: 6X / week Wound Therapy - Follow Up Recommendations: dressing changes by RN  Wound Therapy Goals- Improve the function of patient's integumentary system by progressing the wound(s) through the phases of wound healing (inflammation - proliferation - remodeling) by: Wound Therapy Goals - Improve the function of patient's integumentary system by progressing the wound(s) through the phases of wound healing by: Decrease Necrotic Tissue to: 0% Decrease Necrotic Tissue - Progress: Progressing toward goal Increase Granulation Tissue to: 100% Increase Granulation Tissue - Progress: Progressing toward goal Goals/treatment plan/discharge plan were made with and agreed upon by patient/family: Yes Time For Goal Achievement: 7 days Wound Therapy - Potential for Goals: Good  Goals will be updated until maximal potential achieved or  discharge criteria met.  Discharge criteria: when goals achieved, discharge from hospital, MD decision/surgical intervention, no progress towards goals, refusal/missing three consecutive  treatments without notification or medical reason.  GP     Charges PT Wound Care Charges $Wound Debridement up to 20 cm: < or equal to 20 cm $ Wound Debridement each add'l 20 sqcm: 8 $PT PLS Gun and Tip: 1 Supply $PT Hydrotherapy Visit: 1 Visit       Thelma Comp 01/10/2021, 12:36 PM  Rolinda Roan, PT, DPT Acute Rehabilitation Services Pager: [phone] Office: [phone]

## 2021-01-11 DIAGNOSIS — G9341 Metabolic encephalopathy: Secondary | ICD-10-CM | POA: Diagnosis not present

## 2021-01-11 DIAGNOSIS — M7989 Other specified soft tissue disorders: Secondary | ICD-10-CM | POA: Diagnosis not present

## 2021-01-11 DIAGNOSIS — N179 Acute kidney failure, unspecified: Secondary | ICD-10-CM | POA: Diagnosis not present

## 2021-01-11 DIAGNOSIS — E669 Obesity, unspecified: Secondary | ICD-10-CM | POA: Diagnosis not present

## 2021-01-11 DIAGNOSIS — I5041 Acute combined systolic (congestive) and diastolic (congestive) heart failure: Secondary | ICD-10-CM | POA: Diagnosis not present

## 2021-01-11 DIAGNOSIS — E1165 Type 2 diabetes mellitus with hyperglycemia: Secondary | ICD-10-CM | POA: Diagnosis not present

## 2021-01-11 LAB — BASIC METABOLIC PANEL
Anion gap: 10 (ref 5–15)
BUN: 24 mg/dL — ABNORMAL HIGH (ref 6–20)
CO2: 25 mmol/L (ref 22–32)
Calcium: 8.7 mg/dL — ABNORMAL LOW (ref 8.9–10.3)
Chloride: 104 mmol/L (ref 98–111)
Creatinine, Ser: 1.24 mg/dL — ABNORMAL HIGH (ref 0.44–1.00)
GFR, Estimated: 54 mL/min — ABNORMAL LOW (ref >60)
Glucose, Bld: 132 mg/dL — ABNORMAL HIGH (ref 70–99)
Potassium: 3.9 mmol/L (ref 3.5–5.1)
Sodium: 139 mmol/L (ref 135–145)

## 2021-01-11 LAB — CBC
HCT: 22.4 % — ABNORMAL LOW (ref 36.0–46.0)
Hemoglobin: 7.5 g/dL — ABNORMAL LOW (ref 12.0–15.0)
MCH: 33 pg (ref 26.0–34.0)
MCHC: 33.5 g/dL (ref 30.0–36.0)
MCV: 98.7 fL (ref 80.0–100.0)
Platelets: 347 10*3/uL (ref 150–400)
RBC: 2.27 MIL/uL — ABNORMAL LOW (ref 3.87–5.11)
RDW: 17 % — ABNORMAL HIGH (ref 11.5–15.5)
WBC: 6.9 10*3/uL (ref 4.0–10.5)
nRBC: 0 % (ref 0.0–0.2)

## 2021-01-11 LAB — GLUCOSE, CAPILLARY
Glucose-Capillary: 158 mg/dL — ABNORMAL HIGH (ref 70–99)
Glucose-Capillary: 185 mg/dL — ABNORMAL HIGH (ref 70–99)
Glucose-Capillary: 171 mg/dL — ABNORMAL HIGH (ref 70–99)
Glucose-Capillary: 177 mg/dL — ABNORMAL HIGH (ref 70–99)
Glucose-Capillary: 170 mg/dL — ABNORMAL HIGH (ref 70–99)
Glucose-Capillary: 149 mg/dL — ABNORMAL HIGH (ref 70–99)

## 2021-01-11 MED ORDER — DULOXETINE HCL 30 MG PO CPEP: 60.0000 mg | ORAL_CAPSULE | Freq: Every day | ORAL | Status: DC

## 2021-01-11 MED ORDER — APIXABAN 5 MG PO TABS
5.0000 mg | ORAL_TABLET | Freq: Two times a day (BID) | ORAL | Status: DC
  Administered 2021-01-11 – 2021-01-13 (×5): 5 mg
  Filled 2021-01-11 (×5): qty 1

## 2021-01-11 MED ORDER — SERTRALINE HCL 50 MG PO TABS
25.0000 mg | ORAL_TABLET | Freq: Every day | ORAL | Status: DC
  Administered 2021-01-11 – 2021-01-13 (×3): 25 mg
  Filled 2021-01-11 (×3): qty 1

## 2021-01-11 MED ORDER — APIXABAN 5 MG PO TABS: 5.0000 mg | ORAL_TABLET | Freq: Two times a day (BID) | ORAL | Status: DC

## 2021-01-11 NOTE — Progress Notes (Signed)
   01/11/21 0800   PT - Assessment/Plan  Follow Up Recommendations LTACH  Updating d/c plan as pt would possibly qualify for LTACH due to her wound.  She would benefit from continued therapy there as well.  Will continue to follow pt and progress pt as able.  Thanks.  Ermie Glendenning M,PT Acute Rehab Services [phone] (pager)

## 2021-01-11 NOTE — Progress Notes (Signed)
Physical Therapy Wound Treatment Patient Details  Name: [name] [name] MRN: [id number] Date of Birth: 01/30/1971  Today's Date: 01/11/2021 Time: 8315-1761 Time Calculation (min): 50 min  Subjective  Subjective Assessment Subjective: Pleasant and agreeable, asking for water. Patient and Family Stated Goals: None stated Date of Onset:  (Unsure) Prior Treatments: I&D 6/9  Pain Score:  4-6/10 premedicated and asked for further pain med within session.  Wound Assessment  Wound / Incision (Open or Dehisced) 12/28/20 Buttocks Left cyst- reddened, swollen area with brown drainage coming from left lower buttocks (Active)  Wound Image   01/10/21 1224  Dressing Type ABD;Barrier Film (skin prep);Gauze (Comment);Moist to dry;Normal saline moist dressing;Other (Comment) 01/11/21 1456  Dressing Changed Changed 01/11/21 1456  Dressing Status Clean;Dry;Intact 01/11/21 1456  Dressing Change Frequency Daily 01/11/21 1456  Site / Wound Assessment Yellow;Red;Pink 01/11/21 1456  % Wound base Red or Granulating 60% 01/11/21 1456  % Wound base Yellow/Fibrinous Exudate 40% 01/11/21 1456  % Wound base Black/Eschar 0% 01/11/21 1456  % Wound base Other/Granulation Tissue (Comment) 0% 01/11/21 1456  Peri-wound Assessment Other (Comment) 01/11/21 1456  Wound Length (cm) 20 cm 01/08/21 1300  Wound Width (cm) 20 cm 01/08/21 1300  Wound Depth (cm) 4.8 cm 01/08/21 1300  Wound Volume (cm^3) 1920 cm^3 01/08/21 1300  Wound Surface Area (cm^2) 400 cm^2 01/08/21 1300  Tunneling (cm) 9 cm tunnel at 6:00 01/08/21 1300  Undermining (cm) up to 3 cm from 9:00 to 12:00 01/08/21 1300  Margins Unattached edges (unapproximated) 01/11/21 1456  Closure None 01/10/21 1224  Drainage Amount Moderate 01/11/21 1456  Drainage Description Serous;Sanguineous 01/11/21 1456  Non-staged Wound Description Full thickness 01/10/21 1224  Treatment Cleansed;Debridement (Selective);Hydrotherapy (Pulse lavage);Packing (Saline gauze) 01/11/21  1456   Hydrotherapy Pulsed lavage therapy - wound location: L buttock Pulsed Lavage with Suction (psi): 8 psi Pulsed Lavage with Suction - Normal Saline Used: 1000 mL Pulsed Lavage Tip: Tip with splash shield Selective Debridement Selective Debridement - Location: L buttock Selective Debridement - Tools Used: Forceps, Scissors Selective Debridement - Tissue Removed: Loose yellow slough and more tightly adhered black necrotic tissue    Wound Assessment and Plan  Wound Therapy - Assess/Plan/Recommendations Wound Therapy - Clinical Statement: Still rogressing with debridement. Pt tolerated well overall however complaining of more pain today. Will continue to follow for selective removal of unviable tissue, to decrease bioburden and promote wound bed healing. Wound Therapy - Functional Problem List: Decreased tolerance for OOB to chair and position changes due to pain and decreased skin integrity. Factors Delaying/Impairing Wound Healing: Infection - systemic/local, Incontinence Hydrotherapy Plan: Debridement, Dressing change, Patient/family education, Pulsatile lavage with suction Wound Therapy - Frequency: 6X / week Wound Therapy - Follow Up Recommendations: dressing changes by RN  Wound Therapy Goals- Improve the function of patient's integumentary system by progressing the wound(s) through the phases of wound healing (inflammation - proliferation - remodeling) by: Wound Therapy Goals - Improve the function of patient's integumentary system by progressing the wound(s) through the phases of wound healing by: Decrease Necrotic Tissue to: 0% Decrease Necrotic Tissue - Progress: Progressing toward goal Increase Granulation Tissue to: 100% Increase Granulation Tissue - Progress: Progressing toward goal Goals/treatment plan/discharge plan were made with and agreed upon by patient/family: Yes Time For Goal Achievement: 7 days Wound Therapy - Potential for Goals: Good  Goals will be updated  until maximal potential achieved or discharge criteria met.  Discharge criteria: when goals achieved, discharge from hospital, MD decision/surgical intervention, no progress towards goals, refusal/missing three  consecutive treatments without notification or medical reason.  GP     Charges PT Wound Care Charges $Wound Debridement up to 20 cm: < or equal to 20 cm $ Wound Debridement each add'l 20 sqcm: 8 $PT PLS Gun and Tip: 1 Supply $PT Hydrotherapy Visit: 1 Visit       Tessie Fass Gerhart Ruggieri 01/11/2021, 3:04 PM 01/11/2021  Ginger Carne., PT Acute Rehabilitation Services [phone]  (pager) [phone]  (office)

## 2021-01-11 NOTE — Progress Notes (Signed)
8 Days Post-Op   Subjective/Chief Complaint: Confused and had a fall, head CT negative.  Pt seen during hydrotherapy.  Objective: Vital signs in last 24 hours: Temp:  [98.3 F (36.8 C)-99.2 F (37.3 C)] 99 F (37.2 C) (06/17 1532) Pulse Rate:  [127-139] 133 (06/17 1400) Resp:  [20-31] 21 (06/17 1400) BP: (99-124)/(59-88) 118/86 (06/17 1300) SpO2:  [85 %-98 %] 93 % (06/17 1400) Last BM Date: 01/11/21  Intake/Output from previous day: 06/16 0701 - 06/17 0700 In: 812.3 [NG/GT:475; IV Piggyback:337.3] Out: 250 [Stool:250] Intake/Output this shift: No intake/output data recorded.  General appearance: alert, uncomfortable s/p hydroPT, NAD Incision/Wound:      Lab Results:  Recent Labs    01/10/21 0300 01/11/21 0114  WBC 10.2 6.9  HGB 7.7* 7.5*  HCT 24.2* 22.4*  PLT 336 347   BMET Recent Labs    01/10/21 0300 01/11/21 0114  NA 136 139  K 4.2 3.9  CL 102 104  CO2 26 25  GLUCOSE 147* 132*  BUN 29* 24*  CREATININE 1.32* 1.24*  CALCIUM 8.2* 8.7*   PT/INR  Anti-infectives: Anti-infectives (From admission, onward)    Start     Dose/Rate Route Frequency Ordered Stop   01/10/21 2200  piperacillin-tazobactam (ZOSYN) IVPB 3.375 g        3.375 g 12.5 mL/hr over 240 Minutes Intravenous Every 8 hours 01/10/21 1421 01/17/21 2359   01/09/21 2200  piperacillin-tazobactam (ZOSYN) IVPB 3.375 g  Status:  Discontinued        3.375 g 12.5 mL/hr over 240 Minutes Intravenous Every 12 hours 01/09/21 1146 01/10/21 1421   01/08/21 1400  piperacillin-tazobactam (ZOSYN) IVPB 3.375 g  Status:  Discontinued        3.375 g 12.5 mL/hr over 240 Minutes Intravenous Every 8 hours 01/08/21 0902 01/09/21 1146   01/04/21 1800  piperacillin-tazobactam (ZOSYN) IVPB 3.375 g  Status:  Discontinued        3.375 g 100 mL/hr over 30 Minutes Intravenous Every 6 hours 01/04/21 1059 01/08/21 0902   01/03/21 1400  piperacillin-tazobactam (ZOSYN) IVPB 3.375 g  Status:  Discontinued        3.375  g 12.5 mL/hr over 240 Minutes Intravenous Every 8 hours 01/03/21 1124 01/04/21 1059   01/01/21 1800  piperacillin-tazobactam (ZOSYN) IVPB 3.375 g  Status:  Discontinued        3.375 g 12.5 mL/hr over 240 Minutes Intravenous Every 6 hours 01/01/21 1059 01/03/21 1124   12/31/20 1400  piperacillin-tazobactam (ZOSYN) IVPB 3.375 g  Status:  Discontinued        3.375 g 12.5 mL/hr over 240 Minutes Intravenous Every 6 hours 12/31/20 0918 01/01/21 1059   12/29/20 1400  clindamycin (CLEOCIN) IVPB 900 mg  Status:  Discontinued        900 mg 100 mL/hr over 30 Minutes Intravenous Every 8 hours 12/29/20 1214 12/29/20 1234   12/28/20 2200  linezolid (ZYVOX) IVPB 600 mg  Status:  Discontinued        600 mg 300 mL/hr over 60 Minutes Intravenous Every 12 hours 12/28/20 1620 01/02/21 0920   12/28/20 1830  meropenem (MERREM) 1 g in sodium chloride 0.9 % 100 mL IVPB  Status:  Discontinued        1 g 200 mL/hr over 30 Minutes Intravenous Every 8 hours 12/28/20 1731 12/31/20 0918   12/28/20 1800  meropenem (MERREM) 1 g in sodium chloride 0.9 % 100 mL IVPB  Status:  Discontinued        1  g 200 mL/hr over 30 Minutes Intravenous Every 24 hours 12/28/20 1700 12/28/20 1731   12/28/20 1730  meropenem (MERREM) 1 g in sodium chloride 0.9 % 100 mL IVPB  Status:  Discontinued        1 g 200 mL/hr over 30 Minutes Intravenous Every 8 hours 12/28/20 1639 12/28/20 1700       Assessment/Plan: Necrotizing soft tissue infection left buttock POD#14 S/P DEBRIDEMENT LEFT BUTTOCK NECROTIZING SOFT TISSUE INFECTION - TOTAL TISSUE VOLUME 14CM X 12CM X 3CM DEEP 12/28/2020  Dr. Violeta Gelinas POD#11 S/P INCISION AND DRAINAGE ABSCESS BUTTOCK (Left), 16x15x3cm wound; 6/6 Dr. Derrell Lolling POD#8 S/P IRRIGATION AND DEBRIDEMENT LEFT BUTTOCK 6/9 Dr. Derrell Lolling  - WBC 6.9 - Intraoperative cultures 6/3 with GBS, E. coli, and Prevotella - Wound appears clean and is responding well to hydrotherapy, continue hydro PT. Will make this patient PRN over  the weekend and will be available to assess wound again on Monday. - continue moist-to-dry dressing changes and hydroPT. - continue IV abx per ID  Below per CCM: VDRF Septic shock Renal failure - per nephrology, CRRT stopped yesterday 6/14 per renal, now making urine DM2 Obesity A. Flutter with RVR - started on Eliquis 6/17   FEN: DYS3 ID:  merrem 6/3-6/6, linezolid 6/3-6/8, Zosyn 6/6 >> abx per ID VTE: SCD's, Eliquis per tube Foley: in place for strict I&O's, placed 6/3 Dispo: ICU   LOS: 14 days    Adam Phenix 01/11/2021

## 2021-01-11 NOTE — Hospital Course (Signed)
50 y/o female with PMHx of HTN, type 2 DM, GERD, anxiety and recent vulvar irritation due to Bartholin cyst for which she'd been seen at [facility], admitted as a transfer from [name] where she presented on 12/23/20 with fever and a buttock abscess, worsening renal failure, metabolic acidosis and encephalopathy due to group B strep wound infection.  Admitted to ICU in septic shock.  Hospital course complicated by acute renal failure due to ischemic ATN requiring CRRT, new onset A-fib with RVR, acute respiratory failure with hypoxia requiring intubation, and ongoing metabolic encephalopathy with superimposed ICU delirium.    Significant Events as per PCCM through 6/16: 5/29 admitted to [facility] sepsis felt 2/2 cellulitis on buttocks. Cultures sent. CT abd/pelvis + [place]ody gas but no drainable abscess. Started on Vanc. IVFs. Wound also cultured. 6/1 wound culture back + group B strep. 1 of 4 blood cultures + GPC. ABX changed to rocephin 6/3 Arrived at [facility] from [place] w/ new hypotension, worsening renal failure, worsening leukocytosis, poorly controlled pain and new encephalopathy. Admitted to ICU. Started on zyvox and meropenem. Surgical consult requested> to OR for debridement 6/3 started CRRT, meropenem and Linezolid added 6/4 IVIg >6/6 6/5 Meropenem stopped 6/6 zosyn added 6/6 back to OR for debridement, in and out of atrial flutter; QTc 590; cardiology consult> amiodarone 6/7 off vasopressors, continues to have atrial flutter, started ketamine, Linezolid stopped 6/8 started on metoprolol, echo with LVEF 45-50%. 6/9 OR debridement 6/11 Fever curve improved, remains on CRRT 6/12 extubated 6/13 remains on CRRT 6/14 CRRT stopped, continues to have pain and agitation issues 6/15 agitated, fell out of bed, reported that she hit her head, head CT neg 6/16 much better with psych med changes  Transferred to [facility] service on 01/11/21.

## 2021-01-11 NOTE — Progress Notes (Signed)
[facility]  Date of Admission:  12/28/2020           Reason for visit: Follow up on necrotizing soft tissue infection  Current antibiotics: Pip-tazo 6/6--present     Previous antibiotics: Meropenem 6/3--6/6 Linezolid 6/3--6/8  ASSESSMENT:    Polymicrobial necrotizing skin and soft tissue infection: Status post debridements 6/3, 6/6, and 6/9.  Intraoperative cultures with E. coli, GBS, and Prevotella.  No further surgical debridement planned at this time and patient currently stable on piperacillin tazobactam. Acute kidney injury: Transitioned off CRRT and currently no dialytic needs anticipated with stability of her renal function.  Her temporary HD line was removed 6/16. Diabetes, obesity Atrial flutter with RVR and heart failure: Cardiology followed earlier in the admission.  Potentially A. fib/RVR induced.  Will require ischemic evaluation at some point per cardiology.  Anticoagulation is currently on hold.  PLAN:    Continue piperacillin tazobactam.  Reviewed operative notes and appears that her necrotizing wound was superficial to the fascia.  Plan for 2 weeks of IV antibiotics post debridement.  End date = 01/17/2021 Continue wound care.  Discussed with nursing and would be okay with Foley in this scenario in order to keep the wound as clean as possible since she is having ongoing urinary soiling of the wound despite external catheter.   Also discussed with surgery regarding concerns for ability to heal wound if the area is not kept free of stool contamination and query whether she would be a candidate for colostomy given how close her wound is to the rectum.  Currently she has a rectal tube Glycemic control Will sign off for now, please call as needed   Principal Problem:   Necrotizing soft tissue infection Active Problems:   Septic shock (HCC)   Sepsis due to Streptococcus, group B (HCC)   Acute kidney injury (HCC)   Obesity   Type 2  diabetes mellitus with hyperglycemia (HCC)   Acute hypercapnic respiratory failure (HCC)   Typical atrial flutter (HCC)   Hyperkalemia   New onset a-fib (HCC)   Acute combined systolic and diastolic heart failure (HCC)   Acute metabolic encephalopathy   History of ETT   Encounter for central line placement   Encounter for orogastric (OG) tube placement    MEDICATIONS:    Scheduled Meds: . amiodarone  200 mg Per Tube Q1200  . Chlorhexidine Gluconate Cloth  6 each Topical Q0600  . clonazepam  1 mg Per Tube BID  . docusate  50 mg Per Tube Daily  . fentaNYL  1 patch Transdermal Q72H  . insulin aspart  0-20 Units Subcutaneous Q4H  . insulin aspart  3 Units Subcutaneous Q4H  . insulin glargine  10 Units Subcutaneous BID  . mouth rinse  15 mL Mouth Rinse BID  . multivitamin with minerals  1 tablet Per Tube Daily  . pantoprazole sodium  40 mg Per Tube Daily  . QUEtiapine  50 mg Per Tube QHS  . sodium chloride flush  10-40 mL Intracatheter Q12H   Continuous Infusions: . sodium chloride 250 mL (01/07/21 1223)  . feeding supplement (PIVOT 1.5 CAL) 1,000 mL (01/11/21 0555)  . piperacillin-tazobactam (ZOSYN)  IV 3.375 g (01/11/21 0555)   PRN Meds:.fentaNYL (SUBLIMAZE) injection, food thickener, haloperidol lactate, levalbuterol, LORazepam, ondansetron (ZOFRAN) IV, oxyCODONE, polyethylene glycol, sodium chloride flush  SUBJECTIVE:   24 hour events:  No acute events noted overnight  Zosyn dosing increased based on renal function Afebrile  Creatinine 1.2, WBC 6.9, hemoglobin 7.5, platelets 347 No new cultures No new imaging Stepped down from the ICU  More alert and interactive this morning.  No fevers or chills.  RN reports difficulty with urinary incontinence despite external catheter.  This is causing soiling of her wound.  She continues to have a rectal tube.  Planning for wound care hydrotherapy later this morning.  Review of Systems  All other systems reviewed and are  negative.    OBJECTIVE:   Blood pressure 116/70, pulse (!) 130, temperature 98.4 F (36.9 C), temperature source Oral, resp. rate (!) 29, height 5\' 2"  (1.575 m), weight 93.4 kg, SpO2 91 %. Body mass index is 37.66 kg/m.  Physical Exam Constitutional:      General: She is not in acute distress. HENT:     Head: Normocephalic and atraumatic.  Eyes:     Extraocular Movements: Extraocular movements intact.     Conjunctiva/sclera: Conjunctivae normal.  Pulmonary:     Effort: Pulmonary effort is normal. No respiratory distress.     Comments: On nasal cannula this morning. Musculoskeletal:     Cervical back: Normal range of motion and neck supple.     Comments: Images of wound reviewed in the media tab.  Skin:    General: Skin is warm and dry.     Findings: No rash.  Neurological:     General: No focal deficit present.     Mental Status: She is alert. Mental status is at baseline.  Psychiatric:        Mood and Affect: Mood normal.        Behavior: Behavior normal.     Lab Results: Lab Results  Component Value Date   WBC 6.9 01/11/2021   HGB 7.5 (L) 01/11/2021   HCT 22.4 (L) 01/11/2021   MCV 98.7 01/11/2021   PLT 347 01/11/2021    Lab Results  Component Value Date   NA 139 01/11/2021   K 3.9 01/11/2021   CO2 25 01/11/2021   GLUCOSE 132 (H) 01/11/2021   BUN 24 (H) 01/11/2021   CREATININE 1.24 (H) 01/11/2021   CALCIUM 8.7 (L) 01/11/2021   GFRNONAA 54 (L) 01/11/2021    Lab Results  Component Value Date   ALT 20 01/04/2021   AST 34 01/04/2021   ALKPHOS 374 (H) 01/04/2021   BILITOT 1.4 (H) 01/04/2021    No results found for: CRP  No results found for: ESRSEDRATE   I have reviewed the micro and lab results in Epic.  Imaging: CT HEAD WO CONTRAST  Result Date: 01/09/2021 CLINICAL DATA:  Pain following fall EXAM: CT HEAD WITHOUT CONTRAST TECHNIQUE: Contiguous axial images were obtained from the base of the skull through the vertex without intravenous contrast.  COMPARISON:  October 10, 2007 FINDINGS: Brain: Ventricles and sulci are normal in size and configuration. There is no intracranial mass, hemorrhage, extra-axial fluid collection, or midline shift. Brain parenchyma appears unremarkable. No appreciable acute infarct. Vascular: No hyperdense vessel. Mild calcification in the carotid siphon regions. Skull: Bony calvarium appears intact. Sinuses/Orbits: Paranasal sinuses are clear. Orbits appear symmetric bilaterally. Other: Mastoid air cells clear. IMPRESSION: Normal appearing brain parenchyma. No mass, hemorrhage, extra-axial fluid collection. Mild arterial vascular calcification noted. Electronically Signed   By: October 12, 2007 III M.D.   On: 01/09/2021 08:51   DG Abd Portable 1V  Result Date: 01/10/2021 CLINICAL DATA:  NG tube placement EXAM: PORTABLE ABDOMEN - 1 VIEW COMPARISON:  01/07/2021 FINDINGS: Esophageal tube tip overlies the distal  stomach. Surgical clips in the right upper quadrant. Visible gas pattern is unobstructed IMPRESSION: Esophageal tube tip overlies the distal stomach Electronically Signed   By: Jasmine Pang M.D.   On: 01/10/2021 19:11     Imaging independently reviewed in Epic.    Vedia Coffer for Infectious [facility] [phone] pager 01/11/2021, 7:33 AM

## 2021-01-11 NOTE — Progress Notes (Signed)
PROGRESS NOTE    [name] [name]   MWU:[id number]  DOB: 07-03-1971  PCP: Pcp, No    DOA: 12/28/2020 LOS: 14   Assessment & Plan   Principal Problem:   Necrotizing soft tissue infection Active Problems:   Septic shock (HCC)   Sepsis due to Streptococcus, group B (HCC)   Acute kidney injury (HCC)   Obesity   Type 2 diabetes mellitus with hyperglycemia (HCC)   Acute hypercapnic respiratory failure (HCC)   Typical atrial flutter (HCC)   Hyperkalemia   New onset a-fib (HCC)   Acute combined systolic and diastolic heart failure (HCC)   Acute metabolic encephalopathy   History of ETT   Encounter for central line placement   Encounter for orogastric (OG) tube placement   Septic shock secondary to polymicrobial necrotizing soft tissue infection -improving, off pressors --Continue Zosyn (x 2 weeks after 6/9 presenting this source controlled) --Infectious disease following --Rectal tube and Foley to prevent wound contamination and allow healing; patient incontinent of both -- Hydrotherapy for wound   Persistent encephalopathy due to ICU delirium -Per pharmacist and bedside RN, current medication regimen seems to be finally controlling her agitation.   --Continue current regimen:  Klonopin disintegrating tablet 1 mg twice daily, IV Haldol as needed, pain control including fentanyl patch and as needed oxycodone, as needed IV Ativan  History of anxiety depression  - patient's home Cymbalta cannot be given per tube so started on 25 mg Zoloft for now.  Acute renal failure due to septic ATN -required CRRT, off since 6/14 Hyperkalemia --Nephrology following --Monitor renal function --Discussed with nephro if HD catheter can be removed  Acute blood loss anemia -in the setting of extensive surgery.  Last transfusion 6/15.  May have some myelosuppression as well as small wound losses ongoing  Question heart failure -possibly secondary to A. fib with RVR -in setting of above --Anemia  fairly stable, resume anticoagulation --Continue amiodarone 200 mg daily  Severe physical deconditioning and generalized weakness --PT and OT recommending LTAC. Patient's complex wound care and physical rehab needs likely make her a good candidate. --TOC following  Insulin-dependent type 2 diabetes -continue Lantus 10 unit twice daily, NovoLog 3 unit every 4 hours, sliding scale NovoLog.  Hypoglycemia protocol  Dysphagia -core track in place.  SLP following appreciate input.   Nutrition -tube feeds per orders, dietitian following  Diarrhea -most likely tube feed related.  Rectal tube in place.  Would keep this in the interest of preventing wound contamination.  Obesity: Body mass index is 37.66 kg/m.  Complicates overall care and prognosis.  Recommend lifestyle modifications including physical activity and diet for weight loss and overall long-term health.   DVT prophylaxis: SCDs Start: 12/28/20 1523   Diet:  Diet Orders (From admission, onward)     Start     Ordered   01/08/21 1011  DIET DYS 3 Room service appropriate? Yes with Assist; Fluid consistency: Nectar Thick  Diet effective now       Comments: Meds whole in puree  Question Answer Comment  Room service appropriate? Yes with Assist   Fluid consistency: Nectar Thick      01/08/21 1011              Code Status: Full Code   Brief Narrative / Hospital Course to Date:   50 y/o female with PMHx of HTN, type 2 DM, GERD, anxiety and recent vulvar irritation due to Bartholin cyst for which she'd been seen at [facility], admitted as a  transfer from [place] where she presented on 12/23/20 with fever and a buttock abscess, worsening renal failure, metabolic acidosis and encephalopathy due to group B strep wound infection.  Admitted to ICU in septic shock.  Hospital course complicated by acute renal failure due to ischemic ATN requiring CRRT, new onset A-fib with RVR, acute respiratory failure with hypoxia requiring intubation,  and ongoing metabolic encephalopathy with superimposed ICU delirium.    Significant Events as per PCCM through 6/16: 5/29 admitted to [place] sepsis felt 2/2 cellulitis on buttocks. Cultures sent. CT abd/pelvis + [place] gas but no drainable abscess. Started on Vanc. IVFs. Wound also cultured. 6/1 wound culture back + group B strep. 1 of 4 blood cultures + GPC. ABX changed to rocephin 6/3 Arrived at [facility] from [place] w/ new hypotension, worsening renal failure, worsening leukocytosis, poorly controlled pain and new encephalopathy. Admitted to ICU. Started on zyvox and meropenem. Surgical consult requested> to OR for debridement 6/3 started CRRT, meropenem and Linezolid added 6/4 IVIg >6/6 6/5 Meropenem stopped 6/6 zosyn added 6/6 back to OR for debridement, in and out of atrial flutter; QTc 590; cardiology consult> amiodarone 6/7 off vasopressors, continues to have atrial flutter, started ketamine, Linezolid stopped 6/8 started on metoprolol, echo with LVEF 45-50%. 6/9 OR debridement 6/11 Fever curve improved, remains on CRRT 6/12 extubated 6/13 remains on CRRT 6/14 CRRT stopped, continues to have pain and agitation issues 6/15 agitated, fell out of bed, reported that she hit her head, head CT neg 6/16 much better with psych med changes  Transferred to [facility] service on 01/11/21.    Subjective 01/11/21    Pt seen in ICU this AM.  Sleeping and difficult to arouse.  She wakes briefly, denies uncontrolled pain or other complaints.  Spoke with bedside RN and pharmacist.  Current med regimen doing well controlling her delirium, finally.     Disposition Plan & Communication   Status is: Inpatient  Remains inpatient appropriate because:Inpatient level of care appropriate due to severity of illness  Dispo: The patient is from: Home              Anticipated d/c is to: LTAC              Patient currently is not medically stable to d/c.   Difficult to place patient No        Family  Communication: None present   Consults, Procedures, Significant Events   Consultants:  Nephrology PCCM Infectious disease Cardiology General surgery  Procedures:  As listed above  Antimicrobials:  Anti-infectives (From admission, onward)    Start     Dose/Rate Route Frequency Ordered Stop   01/10/21 2200  piperacillin-tazobactam (ZOSYN) IVPB 3.375 g        3.375 g 12.5 mL/hr over 240 Minutes Intravenous Every 8 hours 01/10/21 1421     01/09/21 2200  piperacillin-tazobactam (ZOSYN) IVPB 3.375 g  Status:  Discontinued        3.375 g 12.5 mL/hr over 240 Minutes Intravenous Every 12 hours 01/09/21 1146 01/10/21 1421   01/08/21 1400  piperacillin-tazobactam (ZOSYN) IVPB 3.375 g  Status:  Discontinued        3.375 g 12.5 mL/hr over 240 Minutes Intravenous Every 8 hours 01/08/21 0902 01/09/21 1146   01/04/21 1800  piperacillin-tazobactam (ZOSYN) IVPB 3.375 g  Status:  Discontinued        3.375 g 100 mL/hr over 30 Minutes Intravenous Every 6 hours 01/04/21 1059 01/08/21 0902   01/03/21 1400  piperacillin-tazobactam (ZOSYN) IVPB  3.375 g  Status:  Discontinued        3.375 g 12.5 mL/hr over 240 Minutes Intravenous Every 8 hours 01/03/21 1124 01/04/21 1059   01/01/21 1800  piperacillin-tazobactam (ZOSYN) IVPB 3.375 g  Status:  Discontinued        3.375 g 12.5 mL/hr over 240 Minutes Intravenous Every 6 hours 01/01/21 1059 01/03/21 1124   12/31/20 1400  piperacillin-tazobactam (ZOSYN) IVPB 3.375 g  Status:  Discontinued        3.375 g 12.5 mL/hr over 240 Minutes Intravenous Every 6 hours 12/31/20 0918 01/01/21 1059   12/29/20 1400  clindamycin (CLEOCIN) IVPB 900 mg  Status:  Discontinued        900 mg 100 mL/hr over 30 Minutes Intravenous Every 8 hours 12/29/20 1214 12/29/20 1234   12/28/20 2200  linezolid (ZYVOX) IVPB 600 mg  Status:  Discontinued        600 mg 300 mL/hr over 60 Minutes Intravenous Every 12 hours 12/28/20 1620 01/02/21 0920   12/28/20 1830  meropenem (MERREM) 1 g in  sodium chloride 0.9 % 100 mL IVPB  Status:  Discontinued        1 g 200 mL/hr over 30 Minutes Intravenous Every 8 hours 12/28/20 1731 12/31/20 0918   12/28/20 1800  meropenem (MERREM) 1 g in sodium chloride 0.9 % 100 mL IVPB  Status:  Discontinued        1 g 200 mL/hr over 30 Minutes Intravenous Every 24 hours 12/28/20 1700 12/28/20 1731   12/28/20 1730  meropenem (MERREM) 1 g in sodium chloride 0.9 % 100 mL IVPB  Status:  Discontinued        1 g 200 mL/hr over 30 Minutes Intravenous Every 8 hours 12/28/20 1639 12/28/20 1700         Micro    Objective   Vitals:   01/11/21 0100 01/11/21 0200 01/11/21 0300 01/11/21 0335  BP: 105/60 121/75 116/70   Pulse: (!) 129 (!) 131 (!) 130   Resp: (!) 29 (!) 28 (!) 29   Temp:    98.4 F (36.9 C)  TempSrc:    Oral  SpO2: 92% 94% 91%   Weight:      Height:        Intake/Output Summary (Last 24 hours) at 01/11/2021 0744 Last data filed at 01/10/2021 2300 Gross per 24 hour  Intake 812.26 ml  Output 250 ml  Net 562.26 ml   Filed Weights   01/07/21 0459 01/08/21 0400 01/08/21 0800  Weight: 99.2 kg 93.4 kg 93.4 kg    Physical Exam:  General exam: Sleeping comfortably, wakes briefly to voice and stimulation, no acute distress HEENT: Core track in place.  Moist mucus membranes, hearing grossly normal  Respiratory system: CTAB, no wheezes, rales or rhonchi, normal respiratory effort. Cardiovascular system: normal S1/S2, tachycardic, bilateral pedal edema.   Gastrointestinal system: soft, NT Central nervous system: Somnolent, no gross focal neurologic deficits, normal speech Skin: dry, intact, normal temperature, wound exam deferred to [facility] Psychiatry: Unable to fully assess as patient is quite somnolent, no agitation or signs of restlessness, responds appropriately  Labs   Data Reviewed: I have personally reviewed following labs and imaging studies  CBC: Recent Labs  Lab 01/07/21 1600 01/08/21 0153 01/09/21 0856 01/09/21 1830  01/10/21 0300 01/11/21 0114  WBC 10.2 10.6* 11.0*  --  10.2 6.9  HGB 7.5* 7.6* 6.6* 7.8* 7.7* 7.5*  HCT 22.9* 23.4* 21.0* 23.5* 24.2* 22.4*  MCV 97.9 97.9 101.4*  --  100.4* 98.7  PLT 209 253 316  --  336 347   Basic Metabolic Panel: Recent Labs  Lab 01/05/21 0553 01/06/21 0847 01/06/21 1547 01/07/21 0030 01/07/21 0742 01/07/21 1600 01/08/21 0153 01/09/21 0856 01/10/21 0300 01/11/21 0114  NA 134* 134*   < > 135 134* 134* 132* 134* 136 139  K 4.3 4.2   < > 4.0 3.9 4.4 4.3 4.0 4.2 3.9  CL 103 102   < > 101 101 100 99 101 102 104  CO2 24 28   < > 25 28 27 24 24 26 25   GLUCOSE 200* 190*   < > 132* 94 139* 166* 257* 147* 132*  BUN 29* 22*   < > 19 17 15 15  24* 29* 24*  CREATININE 1.61* 1.05*   < > 0.95 0.86 0.87 0.95 1.35* 1.32* 1.24*  CALCIUM 7.6* 7.7*   < > 7.7* 7.7* 8.0* 8.3* 8.0* 8.2* 8.7*  MG 2.0 1.9  --  1.8  --   --  2.3  --   --   --   PHOS 4.2 3.0   < > 3.1 2.9 2.9 2.3* 4.4  --   --    < > = values in this interval not displayed.   GFR: Estimated Creatinine Clearance: 59.7 mL/min (A) (by C-G formula based on SCr of 1.24 mg/dL (H)). Liver Function Tests: Recent Labs  Lab 01/07/21 0030 01/07/21 0742 01/07/21 1600 01/08/21 0153 01/09/21 0856  ALBUMIN 1.4* 1.3* 1.7* 2.4* 2.1*   No results for input(s): LIPASE, AMYLASE in the last 168 hours. No results for input(s): AMMONIA in the last 168 hours. Coagulation Profile: No results for input(s): INR, PROTIME in the last 168 hours. Cardiac Enzymes: No results for input(s): CKTOTAL, CKMB, CKMBINDEX, TROPONINI in the last 168 hours. BNP (last 3 results) No results for input(s): PROBNP in the last 8760 hours. HbA1C: No results for input(s): HGBA1C in the last 72 hours. CBG: Recent Labs  Lab 01/10/21 1113 01/10/21 1602 01/10/21 1919 01/10/21 2323 01/11/21 0312  GLUCAP 197* 168* 179* 73 158*   Lipid Profile: No results for input(s): CHOL, HDL, LDLCALC, TRIG, CHOLHDL, LDLDIRECT in the last 72 hours. Thyroid  Function Tests: No results for input(s): TSH, T4TOTAL, FREET4, T3FREE, THYROIDAB in the last 72 hours. Anemia Panel: No results for input(s): VITAMINB12, FOLATE, FERRITIN, TIBC, IRON, RETICCTPCT in the last 72 hours. Sepsis Labs: No results for input(s): PROCALCITON, LATICACIDVEN in the last 168 hours.  Recent Results (from the past 240 hour(s))  Culture, blood (Routine X 2) w Reflex to ID Panel     Status: None   Collection Time: 01/04/21 10:18 AM   Specimen: BLOOD RIGHT HAND  Result Value Ref Range Status   Specimen Description BLOOD RIGHT HAND  Final   Special Requests   Final    BOTTLES DRAWN AEROBIC ONLY Blood Culture adequate volume   Culture   Final    NO GROWTH 5 DAYS Performed at [facility], [address] [place], [address] [place]    Report Status 01/09/2021 FINAL  Final  Culture, blood (Routine X 2) w Reflex to ID Panel     Status: None   Collection Time: 01/04/21 10:25 AM   Specimen: BLOOD LEFT HAND  Result Value Ref Range Status   Specimen Description BLOOD LEFT HAND  Final   Special Requests   Final    BOTTLES DRAWN AEROBIC ONLY Blood Culture adequate volume   Culture   Final  NO GROWTH 5 DAYS Performed at [facility], [address] [place], [place] [address]    Report Status 01/09/2021 FINAL  Final      Imaging Studies   CT HEAD WO CONTRAST  Result Date: 01/09/2021 CLINICAL DATA:  Pain following fall EXAM: CT HEAD WITHOUT CONTRAST TECHNIQUE: Contiguous axial images were obtained from the base of the skull through the vertex without intravenous contrast. COMPARISON:  October 10, 2007 FINDINGS: Brain: Ventricles and sulci are normal in size and configuration. There is no intracranial mass, hemorrhage, extra-axial fluid collection, or midline shift. Brain parenchyma appears unremarkable. No appreciable acute infarct. Vascular: No hyperdense vessel. Mild calcification in the carotid siphon regions. Skull: Bony calvarium appears intact.  Sinuses/Orbits: Paranasal sinuses are clear. Orbits appear symmetric bilaterally. Other: Mastoid air cells clear. IMPRESSION: Normal appearing brain parenchyma. No mass, hemorrhage, extra-axial fluid collection. Mild arterial vascular calcification noted. Electronically Signed   By: Bretta Bang III M.D.   On: 01/09/2021 08:51   DG Abd Portable 1V  Result Date: 01/10/2021 CLINICAL DATA:  NG tube placement EXAM: PORTABLE ABDOMEN - 1 VIEW COMPARISON:  01/07/2021 FINDINGS: Esophageal tube tip overlies the distal stomach. Surgical clips in the right upper quadrant. Visible gas pattern is unobstructed IMPRESSION: Esophageal tube tip overlies the distal stomach Electronically Signed   By: Jasmine Pang M.D.   On: 01/10/2021 19:11     Medications   Scheduled Meds:  amiodarone  200 mg Per Tube Q1200   Chlorhexidine Gluconate Cloth  6 each Topical Q0600   clonazepam  1 mg Per Tube BID   docusate  50 mg Per Tube Daily   fentaNYL  1 patch Transdermal Q72H   insulin aspart  0-20 Units Subcutaneous Q4H   insulin aspart  3 Units Subcutaneous Q4H   insulin glargine  10 Units Subcutaneous BID   mouth rinse  15 mL Mouth Rinse BID   multivitamin with minerals  1 tablet Per Tube Daily   pantoprazole sodium  40 mg Per Tube Daily   QUEtiapine  50 mg Per Tube QHS   sodium chloride flush  10-40 mL Intracatheter Q12H   Continuous Infusions:  sodium chloride 250 mL (01/07/21 1223)   feeding supplement (PIVOT 1.5 CAL) 1,000 mL (01/11/21 0555)   piperacillin-tazobactam (ZOSYN)  IV 3.375 g (01/11/21 0555)       LOS: 14 days    Time spent: 45 minutes    Pennie Banter, DO [facility]  01/11/2021, 7:44 AM      If 7PM-7AM, please contact night-coverage. How to contact the [facility] Attending or Consulting provider 7A - 7P or covering provider during after hours 7P -7A, for this patient?    Check the care team in [facility] and look for a) attending/consulting TR[facility] provider listed and b) the [facility]  team listed Log into [email or web address] and use [place]'s universal password to access. If you do not have the password, please contact the hospital operator. Locate the [facility] provider you are looking for under [facility] and page to a number that you can be directly reached. If you still have difficulty reaching the provider, please page the [facility] (Director on Call) for the Hospitalists listed on amion for assistance.

## 2021-01-12 DIAGNOSIS — M7989 Other specified soft tissue disorders: Secondary | ICD-10-CM | POA: Diagnosis not present

## 2021-01-12 LAB — CBC
HCT: 24.5 % — ABNORMAL LOW (ref 36.0–46.0)
Hemoglobin: 7.6 g/dL — ABNORMAL LOW (ref 12.0–15.0)
MCH: 31.9 pg (ref 26.0–34.0)
MCHC: 31 g/dL (ref 30.0–36.0)
MCV: 102.9 fL — ABNORMAL HIGH (ref 80.0–100.0)
Platelets: 366 10*3/uL (ref 150–400)
RBC: 2.38 MIL/uL — ABNORMAL LOW (ref 3.87–5.11)
RDW: 16.9 % — ABNORMAL HIGH (ref 11.5–15.5)
WBC: 5.3 10*3/uL (ref 4.0–10.5)
nRBC: 0 % (ref 0.0–0.2)

## 2021-01-12 LAB — PHOSPHORUS: Phosphorus: 3.9 mg/dL (ref 2.5–4.6)

## 2021-01-12 LAB — GLUCOSE, CAPILLARY
Glucose-Capillary: 178 mg/dL — ABNORMAL HIGH (ref 70–99)
Glucose-Capillary: 200 mg/dL — ABNORMAL HIGH (ref 70–99)
Glucose-Capillary: 152 mg/dL — ABNORMAL HIGH (ref 70–99)
Glucose-Capillary: 187 mg/dL — ABNORMAL HIGH (ref 70–99)
Glucose-Capillary: 119 mg/dL — ABNORMAL HIGH (ref 70–99)
Glucose-Capillary: 104 mg/dL — ABNORMAL HIGH (ref 70–99)

## 2021-01-12 LAB — BASIC METABOLIC PANEL
Anion gap: 7 (ref 5–15)
BUN: 26 mg/dL — ABNORMAL HIGH (ref 6–20)
CO2: 26 mmol/L (ref 22–32)
Calcium: 8.6 mg/dL — ABNORMAL LOW (ref 8.9–10.3)
Chloride: 106 mmol/L (ref 98–111)
Creatinine, Ser: 1.2 mg/dL — ABNORMAL HIGH (ref 0.44–1.00)
GFR, Estimated: 56 mL/min — ABNORMAL LOW (ref >60)
Glucose, Bld: 186 mg/dL — ABNORMAL HIGH (ref 70–99)
Potassium: 3.9 mmol/L (ref 3.5–5.1)
Sodium: 139 mmol/L (ref 135–145)

## 2021-01-12 LAB — MAGNESIUM: Magnesium: 1.7 mg/dL (ref 1.7–2.4)

## 2021-01-12 MED ORDER — FREE WATER
200.0000 mL | Status: DC
  Administered 2021-01-12: 200 mL

## 2021-01-12 MED ORDER — AMIODARONE HCL 200 MG PO TABS
400.0000 mg | ORAL_TABLET | Freq: Two times a day (BID) | ORAL | Status: DC
  Administered 2021-01-12 – 2021-01-13 (×3): 400 mg
  Filled 2021-01-12 (×3): qty 2

## 2021-01-12 MED ORDER — MAGNESIUM SULFATE 2 GM/50ML IV SOLN
2.0000 g | Freq: Once | INTRAVENOUS | Status: AC
  Administered 2021-01-12: 2 g via INTRAVENOUS
  Filled 2021-01-12: qty 50

## 2021-01-12 NOTE — Progress Notes (Addendum)
PROGRESS NOTE    [name] [name]   TKP:[id number]  DOB: 01/10/1971  PCP: Pcp, No    DOA: 12/28/2020 LOS: 15   Assessment & Plan   Principal Problem:   Necrotizing soft tissue infection Active Problems:   Septic shock (HCC)   Sepsis due to Streptococcus, group B (HCC)   Acute kidney injury (HCC)   Obesity   Type 2 diabetes mellitus with hyperglycemia (HCC)   Acute hypercapnic respiratory failure (HCC)   Typical atrial flutter (HCC)   Hyperkalemia   New onset a-fib (HCC)   Acute combined systolic and diastolic heart failure (HCC)   Acute metabolic encephalopathy   History of ETT   Encounter for central line placement   Encounter for orogastric (OG) tube placement   Septic shock secondary to polymicrobial necrotizing soft tissue infection -improving, off pressors --Continue Zosyn (x 2 weeks after 6/9 presenting this source controlled) --Infectious disease following --Rectal tube and Foley to prevent wound contamination and allow healing; patient incontinent of both -- Hydrotherapy for wound   Persistent encephalopathy due to ICU delirium -Per pharmacist and bedside RN, current medication regimen seems to be finally controlling her agitation.   --Continue current regimen:  Klonopin disintegrating tablet 1 mg twice daily, IV Haldol as needed, pain control including fentanyl patch and as needed oxycodone, as needed IV Ativan  History of anxiety depression  - patient's home Cymbalta cannot be given per tube so started on 25 mg Zoloft for now.  Acute renal failure due to septic ATN -resolved.  Required CRRT, off since 6/14. Hyperkalemia --Nephrology consulted --Monitor renal function  Acute blood loss anemia -in the setting of extensive surgery.  Last transfusion 6/15.  May have some myelosuppression as well as small wound losses ongoing  ?heart failure -most likely due to rapid flutter Atrial Flutter with RVR -in setting of above. 6/18 - HR's sustaining in 130's  again. --Resumed on Eliquis, monitor for bleeding. --Increase amiodarone back up to 400 mg BID --Discussed with cardiology, if increase dose not helpful, would recommend amio drip without bolus tomorrow --maintain K>4, Mg>2  Severe physical deconditioning and generalized weakness --PT and OT recommending LTAC. Patient's complex wound care and physical rehab needs likely make her a good candidate. --TOC following  Insulin-dependent type 2 diabetes -continue Lantus 10 unit twice daily, NovoLog 3 unit every 4 hours, sliding scale NovoLog.  Hypoglycemia protocol  Dysphagia -core track in place.  SLP following appreciate input.   Nutrition -tube feeds per orders, dietitian following. Free water flushes added, monitor and adjust as needed.  Diarrhea -most likely tube feed related.  Rectal tube in place.  Would keep this in the interest of preventing wound contamination.  Obesity: Body mass index is 37.66 kg/m.  Complicates overall care and prognosis.  Recommend lifestyle modifications including physical activity and diet for weight loss and overall long-term health.   DVT prophylaxis: SCDs Start: 12/28/20 1523 apixaban (ELIQUIS) tablet 5 mg   Diet:  Diet Orders (From admission, onward)     Start     Ordered   01/08/21 1011  DIET DYS 3 Room service appropriate? Yes with Assist; Fluid consistency: Nectar Thick  Diet effective now       Comments: Meds whole in puree  Question Answer Comment  Room service appropriate? Yes with Assist   Fluid consistency: Nectar Thick      01/08/21 1011              Code Status: Full Code   Brief  Narrative / Hospital Course to Date:   50 y/o female with PMHx of HTN, type 2 DM, GERD, anxiety and recent vulvar irritation due to Bartholin cyst for which she'd been seen at [facility], admitted as a transfer from [place] where she presented on 12/23/20 with fever and a buttock abscess, worsening renal failure, metabolic acidosis and encephalopathy due to  group B strep wound infection.  Admitted to ICU in septic shock.  Hospital course complicated by acute renal failure due to ischemic ATN requiring CRRT, new onset A-fib with RVR, acute respiratory failure with hypoxia requiring intubation, and ongoing metabolic encephalopathy with superimposed ICU delirium.    Significant Events as per PCCM through 6/16: 5/29 admitted to [place] sepsis felt 2/2 cellulitis on buttocks. Cultures sent. CT abd/pelvis + [place] gas but no drainable abscess. Started on Vanc. IVFs. Wound also cultured. 6/1 wound culture back + group B strep. 1 of 4 blood cultures + GPC. ABX changed to rocephin 6/3 Arrived at [facility] from [place] w/ new hypotension, worsening renal failure, worsening leukocytosis, poorly controlled pain and new encephalopathy. Admitted to ICU. Started on zyvox and meropenem. Surgical consult requested> to OR for debridement 6/3 started CRRT, meropenem and Linezolid added 6/4 IVIg >6/6 6/5 Meropenem stopped 6/6 zosyn added 6/6 back to OR for debridement, in and out of atrial flutter; QTc 590; cardiology consult> amiodarone 6/7 off vasopressors, continues to have atrial flutter, started ketamine, Linezolid stopped 6/8 started on metoprolol, echo with LVEF 45-50%. 6/9 OR debridement 6/11 Fever curve improved, remains on CRRT 6/12 extubated 6/13 remains on CRRT 6/14 CRRT stopped, continues to have pain and agitation issues 6/15 agitated, fell out of bed, reported that she hit her head, head CT neg 6/16 much better with psych med changes  Transferred to [facility] service on 01/11/21.    Subjective 01/12/21    Pt seen in ICU this AM, awake sitting up in bed.  She denies pain at this time.  Also denies N/V, SOB, CP. Nursing reports she had a restless night.      Disposition Plan & Communication   Status is: Inpatient  Remains inpatient appropriate because:Inpatient level of care appropriate due to severity of illness  Dispo: The patient is from: Home               Anticipated d/c is to: LTAC              Patient currently is not medically stable to d/c.   Difficult to place patient No  Family Communication: None present   Consults, Procedures, Significant Events   Consultants:  Nephrology PCCM Infectious disease Cardiology General surgery  Procedures:  As listed above  Antimicrobials:  Anti-infectives (From admission, onward)    Start     Dose/Rate Route Frequency Ordered Stop   01/10/21 2200  piperacillin-tazobactam (ZOSYN) IVPB 3.375 g        3.375 g 12.5 mL/hr over 240 Minutes Intravenous Every 8 hours 01/10/21 1421 01/17/21 2359   01/09/21 2200  piperacillin-tazobactam (ZOSYN) IVPB 3.375 g  Status:  Discontinued        3.375 g 12.5 mL/hr over 240 Minutes Intravenous Every 12 hours 01/09/21 1146 01/10/21 1421   01/08/21 1400  piperacillin-tazobactam (ZOSYN) IVPB 3.375 g  Status:  Discontinued        3.375 g 12.5 mL/hr over 240 Minutes Intravenous Every 8 hours 01/08/21 0902 01/09/21 1146   01/04/21 1800  piperacillin-tazobactam (ZOSYN) IVPB 3.375 g  Status:  Discontinued  3.375 g 100 mL/hr over 30 Minutes Intravenous Every 6 hours 01/04/21 1059 01/08/21 0902   01/03/21 1400  piperacillin-tazobactam (ZOSYN) IVPB 3.375 g  Status:  Discontinued        3.375 g 12.5 mL/hr over 240 Minutes Intravenous Every 8 hours 01/03/21 1124 01/04/21 1059   01/01/21 1800  piperacillin-tazobactam (ZOSYN) IVPB 3.375 g  Status:  Discontinued        3.375 g 12.5 mL/hr over 240 Minutes Intravenous Every 6 hours 01/01/21 1059 01/03/21 1124   12/31/20 1400  piperacillin-tazobactam (ZOSYN) IVPB 3.375 g  Status:  Discontinued        3.375 g 12.5 mL/hr over 240 Minutes Intravenous Every 6 hours 12/31/20 0918 01/01/21 1059   12/29/20 1400  clindamycin (CLEOCIN) IVPB 900 mg  Status:  Discontinued        900 mg 100 mL/hr over 30 Minutes Intravenous Every 8 hours 12/29/20 1214 12/29/20 1234   12/28/20 2200  linezolid (ZYVOX) IVPB 600 mg  Status:   Discontinued        600 mg 300 mL/hr over 60 Minutes Intravenous Every 12 hours 12/28/20 1620 01/02/21 0920   12/28/20 1830  meropenem (MERREM) 1 g in sodium chloride 0.9 % 100 mL IVPB  Status:  Discontinued        1 g 200 mL/hr over 30 Minutes Intravenous Every 8 hours 12/28/20 1731 12/31/20 0918   12/28/20 1800  meropenem (MERREM) 1 g in sodium chloride 0.9 % 100 mL IVPB  Status:  Discontinued        1 g 200 mL/hr over 30 Minutes Intravenous Every 24 hours 12/28/20 1700 12/28/20 1731   12/28/20 1730  meropenem (MERREM) 1 g in sodium chloride 0.9 % 100 mL IVPB  Status:  Discontinued        1 g 200 mL/hr over 30 Minutes Intravenous Every 8 hours 12/28/20 1639 12/28/20 1700         Micro    Objective   Vitals:   01/12/21 0400 01/12/21 0743 01/12/21 1119 01/12/21 1528  BP: 116/74     Pulse: (!) 131     Resp: (!) 21     Temp:  98.5 F (36.9 C) 97.7 F (36.5 C) 99 F (37.2 C)  TempSrc:  Oral Oral Oral  SpO2: 96%     Weight:      Height:        Intake/Output Summary (Last 24 hours) at 01/12/2021 1632 Last data filed at 01/12/2021 1400 Gross per 24 hour  Intake 1947.92 ml  Output 1170 ml  Net 777.92 ml   Filed Weights   01/07/21 0459 01/08/21 0400 01/08/21 0800  Weight: 99.2 kg 93.4 kg 93.4 kg    Physical Exam:  General exam: awake sitting up in bed, no acute distress HEENT: Core track in place.  Moist mucus membranes, hearing grossly normal  Respiratory system: CTAB, diminished Right base, no wheezes, rales or rhonchi, normal respiratory effort. Cardiovascular system: tachycardic, bilateral pedal edema.   Gastrointestinal system: soft, NT Central nervous system: alert & oriented x 3, no gross focal neurologic deficits, normal speech Skin: dry, intact, normal temperature, wound exam deferred to CCS Psychiatry: normal mood, flat affect, interacts appropriately  Labs   Data Reviewed: I have personally reviewed following labs and imaging studies  CBC: Recent Labs   Lab 01/08/21 0153 01/09/21 0856 01/09/21 1830 01/10/21 0300 01/11/21 0114 01/12/21 0113  WBC 10.6* 11.0*  --  10.2 6.9 5.3  HGB 7.6* 6.6* 7.8* 7.7*  7.5* 7.6*  HCT 23.4* 21.0* 23.5* 24.2* 22.4* 24.5*  MCV 97.9 101.4*  --  100.4* 98.7 102.9*  PLT 253 316  --  336 347 366   Basic Metabolic Panel: Recent Labs  Lab 01/06/21 0847 01/06/21 1547 01/07/21 0030 01/07/21 0742 01/07/21 1600 01/08/21 0153 01/09/21 0856 01/10/21 0300 01/11/21 0114 01/12/21 0113  NA 134*   < > 135 134* 134* 132* 134* 136 139 139  K 4.2   < > 4.0 3.9 4.4 4.3 4.0 4.2 3.9 3.9  CL 102   < > 101 101 100 99 101 102 104 106  CO2 28   < > 25 28 27 24 24 26 25 26   GLUCOSE 190*   < > 132* 94 139* 166* 257* 147* 132* 186*  BUN 22*   < > 19 17 15 15  24* 29* 24* 26*  CREATININE 1.05*   < > 0.95 0.86 0.87 0.95 1.35* 1.32* 1.24* 1.20*  CALCIUM 7.7*   < > 7.7* 7.7* 8.0* 8.3* 8.0* 8.2* 8.7* 8.6*  MG 1.9  --  1.8  --   --  2.3  --   --   --  1.7  PHOS 3.0   < > 3.1 2.9 2.9 2.3* 4.4  --   --  3.9   < > = values in this interval not displayed.   GFR: Estimated Creatinine Clearance: 61.7 mL/min (A) (by C-G formula based on SCr of 1.2 mg/dL (H)). Liver Function Tests: Recent Labs  Lab 01/07/21 0030 01/07/21 0742 01/07/21 1600 01/08/21 0153 01/09/21 0856  ALBUMIN 1.4* 1.3* 1.7* 2.4* 2.1*   No results for input(s): LIPASE, AMYLASE in the last 168 hours. No results for input(s): AMMONIA in the last 168 hours. Coagulation Profile: No results for input(s): INR, PROTIME in the last 168 hours. Cardiac Enzymes: No results for input(s): CKTOTAL, CKMB, CKMBINDEX, TROPONINI in the last 168 hours. BNP (last 3 results) No results for input(s): PROBNP in the last 8760 hours. HbA1C: No results for input(s): HGBA1C in the last 72 hours. CBG: Recent Labs  Lab 01/11/21 2323 01/12/21 0358 01/12/21 0740 01/12/21 1117 01/12/21 1526  GLUCAP 149* 178* 200* 152* 187*   Lipid Profile: No results for input(s): CHOL, HDL,  LDLCALC, TRIG, CHOLHDL, LDLDIRECT in the last 72 hours. Thyroid Function Tests: No results for input(s): TSH, T4TOTAL, FREET4, T3FREE, THYROIDAB in the last 72 hours. Anemia Panel: No results for input(s): VITAMINB12, FOLATE, FERRITIN, TIBC, IRON, RETICCTPCT in the last 72 hours. Sepsis Labs: No results for input(s): PROCALCITON, LATICACIDVEN in the last 168 hours.  Recent Results (from the past 240 hour(s))  Culture, blood (Routine X 2) w Reflex to ID Panel     Status: None   Collection Time: 01/04/21 10:18 AM   Specimen: BLOOD RIGHT HAND  Result Value Ref Range Status   Specimen Description BLOOD RIGHT HAND  Final   Special Requests   Final    BOTTLES DRAWN AEROBIC ONLY Blood Culture adequate volume   Culture   Final    NO GROWTH 5 DAYS Performed at [facility], [address], [place], [address] [place]    Report Status 01/09/2021 FINAL  Final  Culture, blood (Routine X 2) w Reflex to ID Panel     Status: None   Collection Time: 01/04/21 10:25 AM   Specimen: BLOOD LEFT HAND  Result Value Ref Range Status   Specimen Description BLOOD LEFT HAND  Final   Special Requests   Final  BOTTLES DRAWN AEROBIC ONLY Blood Culture adequate volume   Culture   Final    NO GROWTH 5 DAYS Performed at [facility], [address] [place], [place] [address]    Report Status 01/09/2021 FINAL  Final      Imaging Studies   DG Abd Portable 1V  Result Date: 01/10/2021 CLINICAL DATA:  NG tube placement EXAM: PORTABLE ABDOMEN - 1 VIEW COMPARISON:  01/07/2021 FINDINGS: Esophageal tube tip overlies the distal stomach. Surgical clips in the right upper quadrant. Visible gas pattern is unobstructed IMPRESSION: Esophageal tube tip overlies the distal stomach Electronically Signed   By: Jasmine Pang M.D.   On: 01/10/2021 19:11     Medications   Scheduled Meds:  amiodarone  400 mg Per Tube BID   apixaban  5 mg Per Tube BID   Chlorhexidine Gluconate Cloth  6 each Topical Q0600    clonazepam  1 mg Per Tube BID   docusate  50 mg Per Tube Daily   fentaNYL  1 patch Transdermal Q72H   free water  200 mL Per Tube Q4H   insulin aspart  0-20 Units Subcutaneous Q4H   insulin aspart  3 Units Subcutaneous Q4H   insulin glargine  10 Units Subcutaneous BID   mouth rinse  15 mL Mouth Rinse BID   multivitamin with minerals  1 tablet Per Tube Daily   pantoprazole sodium  40 mg Per Tube Daily   QUEtiapine  50 mg Per Tube QHS   sertraline  25 mg Per Tube Daily   sodium chloride flush  10-40 mL Intracatheter Q12H   Continuous Infusions:  sodium chloride 250 mL (01/07/21 1223)   feeding supplement (PIVOT 1.5 CAL) 1,000 mL (01/11/21 0555)   piperacillin-tazobactam (ZOSYN)  IV 3.375 g (01/12/21 1303)       LOS: 15 days    Time spent: 30 minutes    Pennie Banter, DO [facility]  01/12/2021, 4:32 PM      If 7PM-7AM, please contact night-coverage. How to contact the [facility] Attending or Consulting provider 7A - 7P or covering provider during after hours 7P -7A, for this patient?    Check the care team in [facility] and look for a) attending/consulting TR[facility] provider listed and b) the [facility] team listed Log into [email or web address] and use [facility] universal password to access. If you do not have the password, please contact the hospital operator. Locate the [facility] provider you are looking for under [facility] and page to a number that you can be directly reached. If you still have difficulty reaching the provider, please page the [facility] (Director on Call) for the Hospitalists listed on amion for assistance.

## 2021-01-12 NOTE — Progress Notes (Signed)
Physical Therapy Wound Treatment Patient Details  Name: Caroly Purewal MRN: [id number] Date of Birth: December 30, 1970  Today's Date: 01/12/2021 Time: 0910-0950 Time Calculation (min): 40 min  Subjective  Subjective Assessment Subjective: Pleasant and agreeable, asking for coffee Patient and Family Stated Goals: None stated Prior Treatments: I&D 6/9  Pain Score: Pain Score: 2   Wound Assessment  Wound / Incision (Open or Dehisced) 12/28/20 Buttocks Left cyst- reddened, swollen area with brown drainage coming from left lower buttocks (Active)  Wound Image   01/10/21 1224  Dressing Type Abdominal binder;Moist to dry;Gauze (Comment);Tape dressing 01/12/21 1151  Dressing Changed Changed 01/12/21 1151  Dressing Status Clean;Dry;Intact 01/12/21 1151  Dressing Change Frequency Daily 01/12/21 1151  Site / Wound Assessment Yellow;Pink;Red 01/12/21 1151  % Wound base Red or Granulating 65% 01/12/21 1151  % Wound base Yellow/Fibrinous Exudate 35% 01/12/21 1151  % Wound base Black/Eschar 0% 01/12/21 1151  % Wound base Other/Granulation Tissue (Comment) 0% 01/12/21 1151  Peri-wound Assessment Intact 01/12/21 1151  Wound Length (cm) 20 cm 01/08/21 1300  Wound Width (cm) 20 cm 01/08/21 1300  Wound Depth (cm) 4.8 cm 01/08/21 1300  Wound Volume (cm^3) 1920 cm^3 01/08/21 1300  Wound Surface Area (cm^2) 400 cm^2 01/08/21 1300  Tunneling (cm) 9 cm tunnel at 6:00 01/08/21 1300  Undermining (cm) up to 3 cm from 9:00 to 12:00 01/08/21 1300  Margins Unattached edges (unapproximated) 01/12/21 1151  Closure None 01/12/21 1151  Drainage Amount Moderate 01/12/21 1151  Drainage Description Serosanguineous 01/12/21 1151  Non-staged Wound Description Full thickness 01/10/21 1224  Treatment Debridement (Selective);Hydrotherapy (Pulse lavage);Packing (Saline gauze) 01/12/21 1151   Hydrotherapy Pulsed lavage therapy - wound location: L buttock Pulsed Lavage with Suction (psi): 8 psi Pulsed Lavage with Suction -  Normal Saline Used: 1000 mL Pulsed Lavage Tip: Tip with splash shield Selective Debridement Selective Debridement - Location: L buttock Selective Debridement - Tools Used: Forceps, Scissors Selective Debridement - Tissue Removed: loose yellow slough    Wound Assessment and Plan  Wound Therapy - Assess/Plan/Recommendations Wound Therapy - Clinical Statement: Still progressing with debridement; wound increasing in granualation tissue.  Recommend Santylin conjunction with selective debridement to assist in debriding remaining necrotic tissue.  Also, consider decreasing hydrotherapy to 3x/week with daily dressing changes. Wound Therapy - Functional Problem List: Decreased tolerance for OOB to chair and position changes due to pain and decreased skin integrity. Factors Delaying/Impairing Wound Healing: Infection - systemic/local, Incontinence Hydrotherapy Plan: Debridement, Dressing change, Patient/family education, Pulsatile lavage with suction Wound Therapy - Frequency: 6X / week Wound Therapy - Follow Up Recommendations: dressing changes by RN  Wound Therapy Goals- Improve the function of patient's integumentary system by progressing the wound(s) through the phases of wound healing (inflammation - proliferation - remodeling) by: Wound Therapy Goals - Improve the function of patient's integumentary system by progressing the wound(s) through the phases of wound healing by: Decrease Necrotic Tissue to: 0% Decrease Necrotic Tissue - Progress: Progressing toward goal Increase Granulation Tissue to: 100% Increase Granulation Tissue - Progress: Progressing toward goal  Goals will be updated until maximal potential achieved or discharge criteria met.  Discharge criteria: when goals achieved, discharge from hospital, MD decision/surgical intervention, no progress towards goals, refusal/missing three consecutive treatments without notification or medical reason.  GP     Charges PT Wound Care  Charges $Wound Debridement up to 20 cm: < or equal to 20 cm $ Wound Debridement each add'l 20 sqcm: 8 $PT PLS Gun and Tip: 1 Supply  01/12/2021 Margie, PT Acute Rehabilitation Services Pager:  [phone] Office:  [place] 01/12/2021, 12:02 PM

## 2021-01-12 NOTE — Plan of Care (Signed)
Pt remained intermittently confused/restless throughout shift. Frequent removal of monitoring equipment and LDAs, such as flexi seal, as well as attempts to get out of bed. Can be re-directed but struggles with remaining oriented to environment for extended periods of time. In order to ensure comfort and prevent further delirium pt has been frequently repositioned, re-oriented, cleaned, encouraged to maintain a normal sleep schedule and assessed for pain. Bed alarm on, floor pads are in place and tele-sitter is monitoring to ensure pt safety. Will continue to monitor.    Problem: Clinical Measurements: Goal: Ability to maintain clinical measurements within normal limits will improve Outcome: Progressing Goal: Cardiovascular complication will be avoided Outcome: Progressing   Problem: Nutrition: Goal: Adequate nutrition will be maintained Outcome: Progressing   Problem: Elimination: Goal: Will not experience complications related to urinary retention Outcome: Progressing   Problem: Safety: Goal: Ability to remain free from injury will improve Outcome: Progressing   Problem: Education: Goal: Knowledge of General Education information will improve Description: Including pain rating scale, medication(s)/side effects and non-pharmacologic comfort measures Outcome: Not Progressing   Problem: Health Behavior/Discharge Planning: Goal: Ability to manage health-related needs will improve Outcome: Not Progressing   Problem: Activity: Goal: Risk for activity intolerance will decrease Outcome: Not Progressing   Problem: Coping: Goal: Level of anxiety will decrease Outcome: Not Progressing   Problem: Elimination: Goal: Will not experience complications related to bowel motility Outcome: Not Progressing   Problem: Pain Managment: Goal: General experience of comfort will improve Outcome: Not Progressing   Problem: Skin Integrity: Goal: Risk for impaired skin integrity will  decrease Outcome: Not Progressing

## 2021-01-12 NOTE — Progress Notes (Signed)
[facility] assisted tele-visit with family

## 2021-01-13 DIAGNOSIS — M7989 Other specified soft tissue disorders: Secondary | ICD-10-CM | POA: Diagnosis not present

## 2021-01-13 LAB — GLUCOSE, CAPILLARY
Glucose-Capillary: 102 mg/dL — ABNORMAL HIGH (ref 70–99)
Glucose-Capillary: 111 mg/dL — ABNORMAL HIGH (ref 70–99)
Glucose-Capillary: 129 mg/dL — ABNORMAL HIGH (ref 70–99)
Glucose-Capillary: 149 mg/dL — ABNORMAL HIGH (ref 70–99)
Glucose-Capillary: 176 mg/dL — ABNORMAL HIGH (ref 70–99)
Glucose-Capillary: 178 mg/dL — ABNORMAL HIGH (ref 70–99)

## 2021-01-13 LAB — BASIC METABOLIC PANEL
Anion gap: 10 (ref 5–15)
BUN: 23 mg/dL — ABNORMAL HIGH (ref 6–20)
CO2: 25 mmol/L (ref 22–32)
Calcium: 8.8 mg/dL — ABNORMAL LOW (ref 8.9–10.3)
Chloride: 105 mmol/L (ref 98–111)
Creatinine, Ser: 1.05 mg/dL — ABNORMAL HIGH (ref 0.44–1.00)
GFR, Estimated: >60 mL/min (ref >60)
Glucose, Bld: 116 mg/dL — ABNORMAL HIGH (ref 70–99)
Potassium: 3.5 mmol/L (ref 3.5–5.1)
Sodium: 140 mmol/L (ref 135–145)

## 2021-01-13 LAB — CBC
HCT: 24.1 % — ABNORMAL LOW (ref 36.0–46.0)
Hemoglobin: 7.8 g/dL — ABNORMAL LOW (ref 12.0–15.0)
MCH: 32.8 pg (ref 26.0–34.0)
MCHC: 32.4 g/dL (ref 30.0–36.0)
MCV: 101.3 fL — ABNORMAL HIGH (ref 80.0–100.0)
Platelets: 337 10*3/uL (ref 150–400)
RBC: 2.38 MIL/uL — ABNORMAL LOW (ref 3.87–5.11)
RDW: 16.6 % — ABNORMAL HIGH (ref 11.5–15.5)
WBC: 6.1 10*3/uL (ref 4.0–10.5)
nRBC: 0 % (ref 0.0–0.2)

## 2021-01-13 MED ORDER — INSULIN ASPART 100 UNIT/ML IJ SOLN
0.0000 [IU] | Freq: Three times a day (TID) | INTRAMUSCULAR | Status: DC
  Administered 2021-01-13: 3 [IU] via SUBCUTANEOUS
  Administered 2021-01-13 (×2): 4 [IU] via SUBCUTANEOUS
  Administered 2021-01-14: 3 [IU] via SUBCUTANEOUS
  Administered 2021-01-14 – 2021-01-19 (×3): 4 [IU] via SUBCUTANEOUS

## 2021-01-13 MED ORDER — APIXABAN 5 MG PO TABS
5.0000 mg | ORAL_TABLET | Freq: Two times a day (BID) | ORAL | Status: DC
  Administered 2021-01-13 – 2021-01-19 (×12): 5 mg via ORAL
  Filled 2021-01-13 (×12): qty 1

## 2021-01-13 MED ORDER — QUETIAPINE FUMARATE 50 MG PO TABS
50.0000 mg | ORAL_TABLET | Freq: Every day | ORAL | Status: DC
  Administered 2021-01-13 – 2021-01-16 (×4): 50 mg via ORAL
  Filled 2021-01-13 (×4): qty 1

## 2021-01-13 MED ORDER — INSULIN ASPART 100 UNIT/ML IJ SOLN
3.0000 [IU] | Freq: Three times a day (TID) | INTRAMUSCULAR | Status: DC
  Administered 2021-01-13 – 2021-01-19 (×10): 3 [IU] via SUBCUTANEOUS

## 2021-01-13 MED ORDER — ADULT MULTIVITAMIN W/MINERALS CH
1.0000 | ORAL_TABLET | Freq: Every day | ORAL | Status: DC
  Administered 2021-01-14 – 2021-01-19 (×6): 1 via ORAL
  Filled 2021-01-13 (×6): qty 1

## 2021-01-13 MED ORDER — CLONAZEPAM 0.25 MG PO TBDP
1.0000 mg | ORAL_TABLET | Freq: Two times a day (BID) | ORAL | Status: DC
  Administered 2021-01-13 – 2021-01-19 (×12): 1 mg via ORAL
  Filled 2021-01-13 (×3): qty 4
  Filled 2021-01-13 (×2): qty 2
  Filled 2021-01-13 (×6): qty 4
  Filled 2021-01-13: qty 2

## 2021-01-13 MED ORDER — SERTRALINE HCL 50 MG PO TABS
25.0000 mg | ORAL_TABLET | Freq: Every day | ORAL | Status: DC
  Administered 2021-01-14 – 2021-01-19 (×6): 25 mg via ORAL
  Filled 2021-01-13 (×6): qty 1

## 2021-01-13 MED ORDER — PANTOPRAZOLE SODIUM 40 MG PO TBEC
40.0000 mg | DELAYED_RELEASE_TABLET | Freq: Every day | ORAL | Status: DC
  Administered 2021-01-13 – 2021-01-19 (×7): 40 mg via ORAL
  Filled 2021-01-13 (×7): qty 1

## 2021-01-13 MED ORDER — DOCUSATE SODIUM 100 MG PO CAPS
100.0000 mg | ORAL_CAPSULE | Freq: Two times a day (BID) | ORAL | Status: DC
  Administered 2021-01-13 – 2021-01-14 (×2): 100 mg via ORAL
  Filled 2021-01-13 (×4): qty 1

## 2021-01-13 MED ORDER — AMIODARONE HCL 200 MG PO TABS
400.0000 mg | ORAL_TABLET | Freq: Two times a day (BID) | ORAL | Status: DC
  Administered 2021-01-13 – 2021-01-16 (×7): 400 mg via ORAL
  Filled 2021-01-13 (×8): qty 2

## 2021-01-13 MED ORDER — OXYCODONE HCL 5 MG PO TABS
10.0000 mg | ORAL_TABLET | Freq: Four times a day (QID) | ORAL | Status: DC | PRN
  Administered 2021-01-13 – 2021-01-18 (×6): 10 mg via ORAL
  Filled 2021-01-13 (×7): qty 2

## 2021-01-13 NOTE — Progress Notes (Addendum)
PROGRESS NOTE    [name] [name]   TGG:[id number]  DOB: 12/04/1970  PCP: Pcp, No    DOA: 12/28/2020 LOS: 16   Assessment & Plan   Principal Problem:   Necrotizing soft tissue infection Active Problems:   Septic shock (HCC)   Sepsis due to Streptococcus, group B (HCC)   Acute kidney injury (HCC)   Obesity   Type 2 diabetes mellitus with hyperglycemia (HCC)   Acute hypercapnic respiratory failure (HCC)   Typical atrial flutter (HCC)   Hyperkalemia   New onset a-fib (HCC)   Acute combined systolic and diastolic heart failure (HCC)   Acute metabolic encephalopathy   History of ETT   Encounter for central line placement   Encounter for orogastric (OG) tube placement   Septic shock secondary to polymicrobial necrotizing soft tissue infection -improving, off pressors --Continue Zosyn (x 2 weeks after 6/9 presenting this source controlled) --Infectious disease following --Rectal tube and Foley to prevent wound contamination and allow healing; patient incontinent of both -- Hydrotherapy for wound   Persistent encephalopathy due to ICU delirium -Per pharmacist and bedside RN, current medication regimen seems to be finally controlling her agitation.   --Continue current regimen:  Klonopin disintegrating tablet 1 mg twice daily, IV Haldol as needed, pain control including fentanyl patch and as needed oxycodone, as needed IV Ativan  History of anxiety depression  - patient's home Cymbalta cannot be given per tube so started on 25 mg Zoloft for now.  Acute renal failure due to septic ATN -resolved.  Required CRRT, off since 6/14. Hyperkalemia --Nephrology consulted --Monitor renal function  Acute blood loss anemia -Improving.  In the setting of extensive surgery.  Last transfusion 6/15.  May have some myelosuppression as well as small wound losses ongoing  ?heart failure -most likely due to rapid flutter Atrial Flutter with RVR -in setting of above. 6/18 - HR's sustaining in  130's again. --Resumed on Eliquis, monitor for bleeding. --Increase amiodarone back up to 400 mg BID --Discussed with cardiology, if increase dose not helpful, would recommend amio drip without bolus tomorrow --maintain K>4, Mg>2  Severe physical deconditioning and generalized weakness --PT and OT recommending LTAC. Patient's complex wound care and physical rehab needs likely make her a good candidate. --TOC following  Insulin-dependent type 2 diabetes -continue Lantus 10 unit twice daily, NovoLog 3 unit every TID WC, sliding scale NovoLog.  Hypoglycemia protocol. --change q4h CBG's and insulin to AC/HS  Dysphagia -pt pulled out core track 6/18 and refuses any NG tube replacment (would have to restrain her).   SLP following appreciate input - diet per SLP --Encourage PO intake now off tube feeds.    Nutrition -tube feeds per orders, dietitian following. Free water flushes added, monitor and adjust as needed.  Diarrhea -most likely tube feed related.  Rectal tube in place.  Would keep this in the interest of preventing wound contamination.  Obesity: Body mass index is 37.66 kg/m.  Complicates overall care and prognosis.  Recommend lifestyle modifications including physical activity and diet for weight loss and overall long-term health.   DVT prophylaxis: SCDs Start: 12/28/20 1523 apixaban (ELIQUIS) tablet 5 mg   Diet:  Diet Orders (From admission, onward)     Start     Ordered   01/08/21 1011  DIET DYS 3 Room service appropriate? Yes with Assist; Fluid consistency: Nectar Thick  Diet effective now       Comments: Meds whole in puree  Question Answer Comment  Room service appropriate? Yes  with Assist   Fluid consistency: Nectar Thick      01/08/21 1011              Code Status: Full Code   Brief Narrative / Hospital Course to Date:   50 y/o female with PMHx of HTN, type 2 DM, GERD, anxiety and recent vulvar irritation due to Bartholin cyst for which she'd been seen at  [facility], admitted as a transfer from [place] where she presented on 12/23/20 with fever and a buttock abscess, worsening renal failure, metabolic acidosis and encephalopathy due to group B strep wound infection.  Admitted to ICU in septic shock.  Hospital course complicated by acute renal failure due to ischemic ATN requiring CRRT, new onset A-fib with RVR, acute respiratory failure with hypoxia requiring intubation, and ongoing metabolic encephalopathy with superimposed ICU delirium.    Significant Events as per PCCM through 6/16: 5/29 admitted to [facility] sepsis felt 2/2 cellulitis on buttocks. Cultures sent. CT abd/pelvis + [place] gas but no drainable abscess. Started on Vanc. IVFs. Wound also cultured. 6/1 wound culture back + group B strep. 1 of 4 blood cultures + GPC. ABX changed to rocephin 6/3 Arrived at [facility] from [place] w/ new hypotension, worsening renal failure, worsening leukocytosis, poorly controlled pain and new encephalopathy. Admitted to ICU. Started on zyvox and meropenem. Surgical consult requested> to OR for debridement 6/3 started CRRT, meropenem and Linezolid added 6/4 IVIg >6/6 6/5 Meropenem stopped 6/6 zosyn added 6/6 back to OR for debridement, in and out of atrial flutter; QTc 590; cardiology consult> amiodarone 6/7 off vasopressors, continues to have atrial flutter, started ketamine, Linezolid stopped 6/8 started on metoprolol, echo with LVEF 45-50%. 6/9 OR debridement 6/11 Fever curve improved, remains on CRRT 6/12 extubated 6/13 remains on CRRT 6/14 CRRT stopped, continues to have pain and agitation issues 6/15 agitated, fell out of bed, reported that she hit her head, head CT neg 6/16 much better with psych med changes  Transferred to [facility] service on 01/11/21.    Subjective 01/13/21    Pt asleep sitting up with juice cup in hand, breakfast tray mostly untouched.  She is lethargic this AM, doesn't stay awake for more than several seconds.  Answers questions  appropriately and follows commands.  Denies pain, N/V or any acute complaints at this time.    Disposition Plan & Communication   Status is: Inpatient  Remains inpatient appropriate because:Inpatient level of care appropriate due to severity of illness  Dispo: The patient is from: Home              Anticipated d/c is to: LTAC              Patient currently is not medically stable to d/c.   Difficult to place patient No  Family Communication: None present   Consults, Procedures, Significant Events   Consultants:  Nephrology PCCM Infectious disease Cardiology General surgery  Procedures:  As listed above  Antimicrobials:  Anti-infectives (From admission, onward)    Start     Dose/Rate Route Frequency Ordered Stop   01/10/21 2200  piperacillin-tazobactam (ZOSYN) IVPB 3.375 g        3.375 g 12.5 mL/hr over 240 Minutes Intravenous Every 8 hours 01/10/21 1421 01/17/21 2359   01/09/21 2200  piperacillin-tazobactam (ZOSYN) IVPB 3.375 g  Status:  Discontinued        3.375 g 12.5 mL/hr over 240 Minutes Intravenous Every 12 hours 01/09/21 1146 01/10/21 1421   01/08/21 1400  piperacillin-tazobactam (ZOSYN) IVPB  3.375 g  Status:  Discontinued        3.375 g 12.5 mL/hr over 240 Minutes Intravenous Every 8 hours 01/08/21 0902 01/09/21 1146   01/04/21 1800  piperacillin-tazobactam (ZOSYN) IVPB 3.375 g  Status:  Discontinued        3.375 g 100 mL/hr over 30 Minutes Intravenous Every 6 hours 01/04/21 1059 01/08/21 0902   01/03/21 1400  piperacillin-tazobactam (ZOSYN) IVPB 3.375 g  Status:  Discontinued        3.375 g 12.5 mL/hr over 240 Minutes Intravenous Every 8 hours 01/03/21 1124 01/04/21 1059   01/01/21 1800  piperacillin-tazobactam (ZOSYN) IVPB 3.375 g  Status:  Discontinued        3.375 g 12.5 mL/hr over 240 Minutes Intravenous Every 6 hours 01/01/21 1059 01/03/21 1124   12/31/20 1400  piperacillin-tazobactam (ZOSYN) IVPB 3.375 g  Status:  Discontinued        3.375 g 12.5  mL/hr over 240 Minutes Intravenous Every 6 hours 12/31/20 0918 01/01/21 1059   12/29/20 1400  clindamycin (CLEOCIN) IVPB 900 mg  Status:  Discontinued        900 mg 100 mL/hr over 30 Minutes Intravenous Every 8 hours 12/29/20 1214 12/29/20 1234   12/28/20 2200  linezolid (ZYVOX) IVPB 600 mg  Status:  Discontinued        600 mg 300 mL/hr over 60 Minutes Intravenous Every 12 hours 12/28/20 1620 01/02/21 0920   12/28/20 1830  meropenem (MERREM) 1 g in sodium chloride 0.9 % 100 mL IVPB  Status:  Discontinued        1 g 200 mL/hr over 30 Minutes Intravenous Every 8 hours 12/28/20 1731 12/31/20 0918   12/28/20 1800  meropenem (MERREM) 1 g in sodium chloride 0.9 % 100 mL IVPB  Status:  Discontinued        1 g 200 mL/hr over 30 Minutes Intravenous Every 24 hours 12/28/20 1700 12/28/20 1731   12/28/20 1730  meropenem (MERREM) 1 g in sodium chloride 0.9 % 100 mL IVPB  Status:  Discontinued        1 g 200 mL/hr over 30 Minutes Intravenous Every 8 hours 12/28/20 1639 12/28/20 1700         Micro    Objective   Vitals:   01/13/21 0800 01/13/21 0900 01/13/21 1000 01/13/21 1100  BP: (!) 83/68 99/77 101/61 (!) 96/55  Pulse: (!) 128 (!) 131 (!) 128 (!) 127  Resp: 17 (!) 24 (!) 21 17  Temp:    98.2 F (36.8 C)  TempSrc:    Axillary  SpO2: 98% 94% 93% 92%  Weight:      Height:        Intake/Output Summary (Last 24 hours) at 01/13/2021 1322 Last data filed at 01/13/2021 1100 Gross per 24 hour  Intake 516.54 ml  Output 1780 ml  Net -1263.46 ml   Filed Weights   01/07/21 0459 01/08/21 0400 01/08/21 0800  Weight: 99.2 kg 93.4 kg 93.4 kg    Physical Exam:  General exam: asleep sitting up in bed with juice cup in hand. lethargic, no acute distress, responds to voice but wakes only briefly HEENT: Core track no longer in place (pt pulled out yest).  Moist mucus membranes, hearing grossly normal  Respiratory system: CTAB, diminished Right base, normal respiratory effort. Cardiovascular  system: tachycardic, bilateral pedal edema.   Central nervous system: no gross focal neurologic deficits, normal speech Skin: dry, intact, normal temperature, wound exam deferred to CCS  Labs   Data Reviewed: I have personally reviewed following labs and imaging studies  CBC: Recent Labs  Lab 01/09/21 0856 01/09/21 1830 01/10/21 0300 01/11/21 0114 01/12/21 0113 01/13/21 0256  WBC 11.0*  --  10.2 6.9 5.3 6.1  HGB 6.6* 7.8* 7.7* 7.5* 7.6* 7.8*  HCT 21.0* 23.5* 24.2* 22.4* 24.5* 24.1*  MCV 101.4*  --  100.4* 98.7 102.9* 101.3*  PLT 316  --  336 347 366 337   Basic Metabolic Panel: Recent Labs  Lab 01/07/21 0030 01/07/21 0742 01/07/21 1600 01/08/21 0153 01/09/21 0856 01/10/21 0300 01/11/21 0114 01/12/21 0113 01/13/21 0256  NA 135 134* 134* 132* 134* 136 139 139 140  K 4.0 3.9 4.4 4.3 4.0 4.2 3.9 3.9 3.5  CL 101 101 100 99 101 102 104 106 105  CO2 25 28 27 24 24 26 25 26 25   GLUCOSE 132* 94 139* 166* 257* 147* 132* 186* 116*  BUN 24* 29* 24* 26* 23*  CREATININE 0.95 0.86 0.87 0.95 1.35* 1.32* 1.24* 1.20* 1.05*  CALCIUM 7.7* 7.7* 8.0* 8.3* 8.0* 8.2* 8.7* 8.6* 8.8*  MG 1.8  --   --  2.3  --   --   --  1.7  --   PHOS 3.1 2.9 2.9 2.3* 4.4  --   --  3.9  --    GFR: Estimated Creatinine Clearance: 70.5 mL/min (A) (by C-G formula based on SCr of 1.05 mg/dL (H)). Liver Function Tests: Recent Labs  Lab 01/07/21 0030 01/07/21 0742 01/07/21 1600 01/08/21 0153 01/09/21 0856  ALBUMIN 1.4* 1.3* 1.7* 2.4* 2.1*   No results for input(s): LIPASE, AMYLASE in the last 168 hours. No results for input(s): AMMONIA in the last 168 hours. Coagulation Profile: No results for input(s): INR, PROTIME in the last 168 hours. Cardiac Enzymes: No results for input(s): CKTOTAL, CKMB, CKMBINDEX, TROPONINI in the last 168 hours. BNP (last 3 results) No results for input(s): PROBNP in the last 8760 hours. HbA1C: No results for input(s): HGBA1C in the last 72  hours. CBG: Recent Labs  Lab 01/12/21 1922 01/12/21 2317 01/13/21 0328 01/13/21 0746 01/13/21 1142  GLUCAP 119* 104* 102* 111* 176*   Lipid Profile: No results for input(s): CHOL, HDL, LDLCALC, TRIG, CHOLHDL, LDLDIRECT in the last 72 hours. Thyroid Function Tests: No results for input(s): TSH, T4TOTAL, FREET4, T3FREE, THYROIDAB in the last 72 hours. Anemia Panel: No results for input(s): VITAMINB12, FOLATE, FERRITIN, TIBC, IRON, RETICCTPCT in the last 72 hours. Sepsis Labs: No results for input(s): PROCALCITON, LATICACIDVEN in the last 168 hours.  Recent Results (from the past 240 hour(s))  Culture, blood (Routine X 2) w Reflex to ID Panel     Status: None   Collection Time: 01/04/21 10:18 AM   Specimen: BLOOD RIGHT HAND  Result Value Ref Range Status   Specimen Description BLOOD RIGHT HAND  Final   Special Requests   Final    BOTTLES DRAWN AEROBIC ONLY Blood Culture adequate volume   Culture   Final    NO GROWTH 5 DAYS Performed at [facility], [address] [place], [place] [address]    Report Status 01/09/2021 FINAL  Final  Culture, blood (Routine X 2) w Reflex to ID Panel     Status: None   Collection Time: 01/04/21 10:25 AM   Specimen: BLOOD LEFT HAND  Result Value Ref Range Status   Specimen Description BLOOD LEFT HAND  Final   Special Requests   Final  BOTTLES DRAWN AEROBIC ONLY Blood Culture adequate volume   Culture   Final    NO GROWTH 5 DAYS Performed at [facility], [address], [place], [place] [address]    Report Status 01/09/2021 FINAL  Final      Imaging Studies   No results found.   Medications   Scheduled Meds:  amiodarone  400 mg Oral BID   apixaban  5 mg Oral BID   Chlorhexidine Gluconate Cloth  6 each Topical Q0600   clonazepam  1 mg Oral BID   docusate sodium  100 mg Oral BID   fentaNYL  1 patch Transdermal Q72H   insulin aspart  0-20 Units Subcutaneous TID WC   insulin aspart  3 Units Subcutaneous TID WC    insulin glargine  10 Units Subcutaneous BID   mouth rinse  15 mL Mouth Rinse BID   [START ON 01/14/2021] multivitamin with minerals  1 tablet Oral Daily   pantoprazole  40 mg Oral Daily   QUEtiapine  50 mg Oral QHS   [START ON 01/14/2021] sertraline  25 mg Oral Daily   sodium chloride flush  10-40 mL Intracatheter Q12H   Continuous Infusions:  sodium chloride 250 mL (01/07/21 1223)   feeding supplement (PIVOT 1.5 CAL) 1,000 mL (01/11/21 0555)   piperacillin-tazobactam (ZOSYN)  IV Stopped (01/13/21 0944)       LOS: 16 days    Time spent: 25 minutes with > 50% spent at bedside and in coordination of care.    Pennie Banter, DO [facility]  01/13/2021, 1:22 PM      If 7PM-7AM, please contact night-coverage. How to contact the [facility] Attending or Consulting provider 7A - 7P or covering provider during after hours 7P -7A, for this patient?    Check the care team in [facility] and look for a) attending/consulting TR[facility] provider listed and b) the [facility] team listed Log into [email or web address] and use [facility] universal password to access. If you do not have the password, please contact the hospital operator. Locate the [facility] provider you are looking for under [facility] and page to a number that you can be directly reached. If you still have difficulty reaching the provider, please page the [facility] (Director on Call) for the Hospitalists listed on amion for assistance.

## 2021-01-13 NOTE — Plan of Care (Signed)
Pt continues to have intermittent confusion and delirium. While pt consistently remains oriented to location, time, and person she frequently misunderstands the situation regarding her hospitalization. Throughout shift she has frequently attempted to get out of bed, has removed monitoring equipment, removed a PIV, and removed her sacral wound dressing. Pt had E-link visit with family last evening that was therapeutic for her and helped to lower anxiety and re-orient her to situation for a period of time. Staff has attempted to mitigate delirium by frequent attempts at re-orientation, pain/anxiety medication, assisting with re-positioning, offering fluids/food, and encouraging a regular sleep schedule. Safety measures in place include floor mats, tele-sitter, and patient safety mitts.   Problem: Clinical Measurements: Goal: Ability to maintain clinical measurements within normal limits will improve Outcome: Progressing Goal: Respiratory complications will improve Outcome: Progressing Goal: Cardiovascular complication will be avoided Outcome: Progressing   Problem: Elimination: Goal: Will not experience complications related to urinary retention Outcome: Progressing   Problem: Skin Integrity: Goal: Risk for impaired skin integrity will decrease Outcome: Progressing   Problem: Education: Goal: Knowledge of General Education information will improve Description: Including pain rating scale, medication(s)/side effects and non-pharmacologic comfort measures Outcome: Not Progressing   Problem: Health Behavior/Discharge Planning: Goal: Ability to manage health-related needs will improve Outcome: Not Progressing   Problem: Activity: Goal: Risk for activity intolerance will decrease Outcome: Not Progressing   Problem: Nutrition: Goal: Adequate nutrition will be maintained Outcome: Not Progressing   Problem: Coping: Goal: Level of anxiety will decrease Outcome: Not Progressing   Problem:  Elimination: Goal: Will not experience complications related to bowel motility Outcome: Not Progressing   Problem: Pain Managment: Goal: General experience of comfort will improve Outcome: Not Progressing   Problem: Safety: Goal: Ability to remain free from injury will improve Outcome: Not Progressing

## 2021-01-14 ENCOUNTER — Inpatient Hospital Stay (HOSPITAL_COMMUNITY): Payer: Medicaid Other

## 2021-01-14 DIAGNOSIS — M7989 Other specified soft tissue disorders: Secondary | ICD-10-CM | POA: Diagnosis not present

## 2021-01-14 LAB — GLUCOSE, CAPILLARY
Glucose-Capillary: 186 mg/dL — ABNORMAL HIGH (ref 70–99)
Glucose-Capillary: 164 mg/dL — ABNORMAL HIGH (ref 70–99)
Glucose-Capillary: 189 mg/dL — ABNORMAL HIGH (ref 70–99)
Glucose-Capillary: 149 mg/dL — ABNORMAL HIGH (ref 70–99)
Glucose-Capillary: 138 mg/dL — ABNORMAL HIGH (ref 70–99)
Glucose-Capillary: 161 mg/dL — ABNORMAL HIGH (ref 70–99)

## 2021-01-14 LAB — BASIC METABOLIC PANEL
Anion gap: 8 (ref 5–15)
BUN: 19 mg/dL (ref 6–20)
CO2: 25 mmol/L (ref 22–32)
Calcium: 8.5 mg/dL — ABNORMAL LOW (ref 8.9–10.3)
Chloride: 105 mmol/L (ref 98–111)
Creatinine, Ser: 1.19 mg/dL — ABNORMAL HIGH (ref 0.44–1.00)
GFR, Estimated: 57 mL/min — ABNORMAL LOW (ref >60)
Glucose, Bld: 214 mg/dL — ABNORMAL HIGH (ref 70–99)
Potassium: 3.9 mmol/L (ref 3.5–5.1)
Sodium: 138 mmol/L (ref 135–145)

## 2021-01-14 LAB — CBC
HCT: 24.9 % — ABNORMAL LOW (ref 36.0–46.0)
Hemoglobin: 7.9 g/dL — ABNORMAL LOW (ref 12.0–15.0)
MCH: 32.4 pg (ref 26.0–34.0)
MCHC: 31.7 g/dL (ref 30.0–36.0)
MCV: 102 fL — ABNORMAL HIGH (ref 80.0–100.0)
Platelets: 330 10*3/uL (ref 150–400)
RBC: 2.44 MIL/uL — ABNORMAL LOW (ref 3.87–5.11)
RDW: 16.3 % — ABNORMAL HIGH (ref 11.5–15.5)
WBC: 4.5 10*3/uL (ref 4.0–10.5)
nRBC: 0 % (ref 0.0–0.2)

## 2021-01-14 LAB — MAGNESIUM: Magnesium: 1.9 mg/dL (ref 1.7–2.4)

## 2021-01-14 MED ORDER — DULOXETINE HCL 60 MG PO CPEP
60.0000 mg | ORAL_CAPSULE | Freq: Every day | ORAL | Status: DC
  Administered 2021-01-15 – 2021-01-19 (×5): 60 mg via ORAL
  Filled 2021-01-14 (×5): qty 1

## 2021-01-14 MED ORDER — MAGNESIUM SULFATE IN D5W 1-5 GM/100ML-% IV SOLN
1.0000 g | Freq: Once | INTRAVENOUS | Status: AC
  Administered 2021-01-14: 1 g via INTRAVENOUS
  Filled 2021-01-14: qty 100

## 2021-01-14 MED ORDER — POTASSIUM CHLORIDE CRYS ER 20 MEQ PO TBCR
40.0000 meq | EXTENDED_RELEASE_TABLET | Freq: Once | ORAL | Status: AC
  Administered 2021-01-14: 40 meq via ORAL
  Filled 2021-01-14: qty 2

## 2021-01-14 NOTE — Progress Notes (Signed)
Physical Therapy Wound Treatment Patient Details  Name: [name] [name] MRN: [id number] Date of Birth: May 27, 1971  Today's Date: 01/14/2021 Time: 1030-1102 Time Calculation (min): 32 min  Subjective  Subjective Assessment Subjective: Tangential, tearful/emotional Patient and Family Stated Goals: None stated Date of Onset:  (Unsure) Prior Treatments: I&D 6/9  Pain Score:  Pt premedicated and tolerated treatment well without complaints.   Wound Assessment  Wound / Incision (Open or Dehisced) 12/28/20 Buttocks Left cyst- reddened, swollen area with brown drainage coming from left lower buttocks (Active)  Wound Image   01/14/21 1232  Dressing Type ABD;Barrier Film (skin prep);Gauze (Comment);Moist to moist 01/14/21 1232  Dressing Changed Changed 01/14/21 1232  Dressing Status Clean;Dry;Intact 01/14/21 1232  Dressing Change Frequency Daily 01/14/21 1232  Site / Wound Assessment Red;Yellow 01/14/21 1232  % Wound base Red or Granulating 65% 01/14/21 1232  % Wound base Yellow/Fibrinous Exudate 35% 01/14/21 1232  % Wound base Black/Eschar 0% 01/14/21 1232  % Wound base Other/Granulation Tissue (Comment) 0% 01/14/21 1232  Peri-wound Assessment Intact;Pink 01/14/21 1232  Wound Length (cm) 20 cm 01/08/21 1300  Wound Width (cm) 20 cm 01/08/21 1300  Wound Depth (cm) 4.8 cm 01/08/21 1300  Wound Volume (cm^3) 1920 cm^3 01/08/21 1300  Wound Surface Area (cm^2) 400 cm^2 01/08/21 1300  Tunneling (cm) 9 cm tunnel at 6:00 01/08/21 1300  Undermining (cm) up to 3 cm from 9:00 to 12:00 01/08/21 1300  Margins Unattached edges (unapproximated) 01/14/21 1232  Closure None 01/14/21 1232  Drainage Amount Minimal 01/14/21 1232  Drainage Description Serosanguineous 01/14/21 1232  Non-staged Wound Description Full thickness 01/14/21 1232  Treatment Debridement (Selective);Hydrotherapy (Pulse lavage);Packing (Saline gauze) 01/14/21 1232   Hydrotherapy Pulsed lavage therapy - wound location: L  buttock Pulsed Lavage with Suction (psi): 8 psi Pulsed Lavage with Suction - Normal Saline Used: 1000 mL Pulsed Lavage Tip: Tip with splash shield Selective Debridement Selective Debridement - Location: L buttock Selective Debridement - Tools Used: Forceps, Scissors Selective Debridement - Tissue Removed: loose yellow slough    Wound Assessment and Plan  Wound Therapy - Assess/Plan/Recommendations Wound Therapy - Clinical Statement: Still progressing with debridement; wound increasing in granualation tissue.  This patient will benefit from continued hydrotherapy for selective removal of unviable tissue, to decrease bioburden, and promote wound bed healing.  Wound Therapy - Functional Problem List: Decreased tolerance for OOB to chair and position changes due to pain and decreased skin integrity. Factors Delaying/Impairing Wound Healing: Infection - systemic/local, Incontinence Hydrotherapy Plan: Debridement, Dressing change, Patient/family education, Pulsatile lavage with suction Wound Therapy - Frequency: 6X / week Wound Therapy - Follow Up Recommendations: dressing changes by RN  Wound Therapy Goals- Improve the function of patient's integumentary system by progressing the wound(s) through the phases of wound healing (inflammation - proliferation - remodeling) by: Wound Therapy Goals - Improve the function of patient's integumentary system by progressing the wound(s) through the phases of wound healing by: Decrease Necrotic Tissue to: 0% Decrease Necrotic Tissue - Progress: Progressing toward goal Increase Granulation Tissue to: 100% Increase Granulation Tissue - Progress: Progressing toward goal Goals/treatment plan/discharge plan were made with and agreed upon by patient/family: Yes Time For Goal Achievement: 7 days Wound Therapy - Potential for Goals: Good  Goals will be updated until maximal potential achieved or discharge criteria met.  Discharge criteria: when goals achieved,  discharge from hospital, MD decision/surgical intervention, no progress towards goals, refusal/missing three consecutive treatments without notification or medical reason.  GP     Charges PT Wound Care  Charges $Wound Debridement up to 20 cm: < or equal to 20 cm $ Wound Debridement each add'l 20 sqcm: 8 $PT PLS Gun and Tip: 1 Supply $PT Hydrotherapy Visit: 1 Visit       Thelma Comp 01/14/2021, 1:23 PM  Rolinda Roan, PT, DPT Acute Rehabilitation Services Pager: [phone] Office: [phone]

## 2021-01-14 NOTE — Progress Notes (Signed)
PROGRESS NOTE    [name] [name]   WEX:[id number]  DOB: [birth year]  PCP: Pcp, No    DOA: 12/28/2020 LOS: 17   Assessment & Plan   Principal Problem:   Necrotizing soft tissue infection Active Problems:   Septic shock (HCC)   Sepsis due to Streptococcus, group B (HCC)   Acute kidney injury (HCC)   Obesity   Type 2 diabetes mellitus with hyperglycemia (HCC)   Acute hypercapnic respiratory failure (HCC)   Typical atrial flutter (HCC)   Hyperkalemia   New onset a-fib (HCC)   Acute combined systolic and diastolic heart failure (HCC)   Acute metabolic encephalopathy   History of ETT   Encounter for central line placement   Encounter for orogastric (OG) tube placement   Septic shock secondary to polymicrobial necrotizing soft tissue infection -improving, off pressors --Continue Zosyn (x 2 weeks after 6/9 presenting this source controlled) --Infectious disease following --Rectal tube and Foley to prevent wound contamination and allow healing; patient incontinent of both -- Hydrotherapy for wound   Persistent encephalopathy due to ICU delirium - with underlying depression and anxiety. Per pharmacist and bedside RN, current medication regimen seems to be finally controlling her agitation.   --Continue current regimen:  Klonopin disintegrating tablet 1 mg twice daily IV Haldol as needed Pain control including fentanyl patch  PRN oxycodone, PRN IV Ativan  History of anxiety depression  - had substituted Cymbalta with Zoloft for administration per tube.  Only on Zoloft a few days. --Resume Cymbalta  Acute renal failure due to septic ATN -resolved.  Required CRRT, off since 6/14. Hyperkalemia --Nephrology consulted --Monitor renal function  Acute blood loss anemia -Improving.  In the setting of extensive surgery.  Last transfusion 6/15.  May have some myelosuppression as well as small wound losses ongoing  ?heart failure -most likely due to rapid flutter Atrial Flutter with  RVR -in setting of above. 6/18 - HR's sustaining in 130's again. Increased amio to 400 mg BID after d/w cardiology 6/19-20 - HR's better 110's-120's --Resumed on Eliquis, monitor for bleeding. --Increase amiodarone to 400 mg BID (from 200 daily) --Discussed with cardiology, if increase dose not helpful, would recommend amio drip without bolus  --maintain K>4, Mg>2  Severe physical deconditioning and generalized weakness --PT and OT recommending LTAC. Patient's complex wound care and physical rehab needs likely make her a good candidate. --TOC following  Insulin-dependent type 2 diabetes -continue Lantus 10 unit twice daily, NovoLog 3 unit every TID WC, sliding scale NovoLog.  Hypoglycemia protocol.  Dysphagia -pt pulled out core track 6/18 and refuses any NG tube replacment (would have to restrain her).   SLP following appreciate input - diet per SLP --Modified Barrium Swallow 6/20 - see report --Diet advanced to regular, thin liquids 6/20 --Encourage PO intake now off tube feeds.    Nutrition -tube feeds per orders, dietitian following. Free water flushes added, monitor and adjust as needed.  Diarrhea -most likely tube feed related.  Rectal tube in place.  Would keep this in the interest of preventing wound contamination.  Obesity: Body mass index is 37.66 kg/m.  Complicates overall care and prognosis.  Recommend lifestyle modifications including physical activity and diet for weight loss and overall long-term health.   DVT prophylaxis: SCDs Start: 12/28/20 1523 apixaban (ELIQUIS) tablet 5 mg   Diet:  Diet Orders (From admission, onward)     Start     Ordered   01/14/21 1433  Diet regular Room service appropriate? Yes with Assist; Fluid  consistency: Thin  Diet effective now       Question Answer Comment  Room service appropriate? Yes with Assist   Fluid consistency: Thin      01/14/21 1432              Code Status: Full Code   Brief Narrative / Hospital Course to  Date:   50 y/o female with PMHx of HTN, type 2 DM, GERD, anxiety and recent vulvar irritation due to Bartholin cyst for which she'd been seen at [facility], admitted as a transfer from [place] where she presented on 12/23/20 with fever and a buttock abscess, worsening renal failure, metabolic acidosis and encephalopathy due to group B strep wound infection.  Admitted to ICU in septic shock.  Hospital course complicated by acute renal failure due to ischemic ATN requiring CRRT, new onset A-fib with RVR, acute respiratory failure with hypoxia requiring intubation, and ongoing metabolic encephalopathy with superimposed ICU delirium.    Significant Events as per PCCM through 6/16: 5/29 admitted to [place] sepsis felt 2/2 cellulitis on buttocks. Cultures sent. CT abd/pelvis + [place] gas but no drainable abscess. Started on Vanc. IVFs. Wound also cultured. 6/1 wound culture back + group B strep. 1 of 4 blood cultures + GPC. ABX changed to rocephin 6/3 Arrived at [facility] from [place] w/ new hypotension, worsening renal failure, worsening leukocytosis, poorly controlled pain and new encephalopathy. Admitted to ICU. Started on zyvox and meropenem. Surgical consult requested> to OR for debridement 6/3 started CRRT, meropenem and Linezolid added 6/4 IVIg >6/6 6/5 Meropenem stopped 6/6 zosyn added 6/6 back to OR for debridement, in and out of atrial flutter; QTc 590; cardiology consult> amiodarone 6/7 off vasopressors, continues to have atrial flutter, started ketamine, Linezolid stopped 6/8 started on metoprolol, echo with LVEF 45-50%. 6/9 OR debridement 6/11 Fever curve improved, remains on CRRT 6/12 extubated 6/13 remains on CRRT 6/14 CRRT stopped, continues to have pain and agitation issues 6/15 agitated, fell out of bed, reported that she hit her head, head CT neg 6/16 much better with psych med changes  Transferred to [facility] service on 01/11/21.    Subjective 01/14/21    Pt sleeping soundly, seen in ICU.   Per bedside RN, she had an agitated and restless morning, hydrotherapy done earlier.  She just recently calmed down and resting.  No acute events reported.     Disposition Plan & Communication   Status is: Inpatient  Remains inpatient appropriate because:Inpatient level of care appropriate due to severity of illness  Dispo: The patient is from: Home              Anticipated d/c is to: LTAC              Patient currently is not medically stable to d/c.   Difficult to place patient No  Family Communication: None present   Consults, Procedures, Significant Events   Consultants:  Nephrology PCCM Infectious disease Cardiology General surgery  Procedures:  As listed above  Antimicrobials:  Anti-infectives (From admission, onward)    Start     Dose/Rate Route Frequency Ordered Stop   01/10/21 2200  piperacillin-tazobactam (ZOSYN) IVPB 3.375 g        3.375 g 12.5 mL/hr over 240 Minutes Intravenous Every 8 hours 01/10/21 1421 01/17/21 2359   01/09/21 2200  piperacillin-tazobactam (ZOSYN) IVPB 3.375 g  Status:  Discontinued        3.375 g 12.5 mL/hr over 240 Minutes Intravenous Every 12 hours 01/09/21 1146 01/10/21 1421  01/08/21 1400  piperacillin-tazobactam (ZOSYN) IVPB 3.375 g  Status:  Discontinued        3.375 g 12.5 mL/hr over 240 Minutes Intravenous Every 8 hours 01/08/21 0902 01/09/21 1146   01/04/21 1800  piperacillin-tazobactam (ZOSYN) IVPB 3.375 g  Status:  Discontinued        3.375 g 100 mL/hr over 30 Minutes Intravenous Every 6 hours 01/04/21 1059 01/08/21 0902   01/03/21 1400  piperacillin-tazobactam (ZOSYN) IVPB 3.375 g  Status:  Discontinued        3.375 g 12.5 mL/hr over 240 Minutes Intravenous Every 8 hours 01/03/21 1124 01/04/21 1059   01/01/21 1800  piperacillin-tazobactam (ZOSYN) IVPB 3.375 g  Status:  Discontinued        3.375 g 12.5 mL/hr over 240 Minutes Intravenous Every 6 hours 01/01/21 1059 01/03/21 1124   12/31/20 1400  piperacillin-tazobactam  (ZOSYN) IVPB 3.375 g  Status:  Discontinued        3.375 g 12.5 mL/hr over 240 Minutes Intravenous Every 6 hours 12/31/20 0918 01/01/21 1059   12/29/20 1400  clindamycin (CLEOCIN) IVPB 900 mg  Status:  Discontinued        900 mg 100 mL/hr over 30 Minutes Intravenous Every 8 hours 12/29/20 1214 12/29/20 1234   12/28/20 2200  linezolid (ZYVOX) IVPB 600 mg  Status:  Discontinued        600 mg 300 mL/hr over 60 Minutes Intravenous Every 12 hours 12/28/20 1620 01/02/21 0920   12/28/20 1830  meropenem (MERREM) 1 g in sodium chloride 0.9 % 100 mL IVPB  Status:  Discontinued        1 g 200 mL/hr over 30 Minutes Intravenous Every 8 hours 12/28/20 1731 12/31/20 0918   12/28/20 1800  meropenem (MERREM) 1 g in sodium chloride 0.9 % 100 mL IVPB  Status:  Discontinued        1 g 200 mL/hr over 30 Minutes Intravenous Every 24 hours 12/28/20 1700 12/28/20 1731   12/28/20 1730  meropenem (MERREM) 1 g in sodium chloride 0.9 % 100 mL IVPB  Status:  Discontinued        1 g 200 mL/hr over 30 Minutes Intravenous Every 8 hours 12/28/20 1639 12/28/20 1700         Micro    Objective   Vitals:   01/14/21 1400 01/14/21 1500 01/14/21 1516 01/14/21 1600  BP:  112/65  109/73  Pulse: (!) 118 (!) 120  (!) 118  Resp: 19 (!) 26  17  Temp:   98.3 F (36.8 C)   TempSrc:   Oral   SpO2: 95% 93%  95%  Weight:      Height:        Intake/Output Summary (Last 24 hours) at 01/14/2021 1734 Last data filed at 01/14/2021 1615 Gross per 24 hour  Intake 5.4 ml  Output 1355 ml  Net -1349.6 ml   Filed Weights   01/07/21 0459 01/08/21 0400 01/08/21 0800  Weight: 99.2 kg 93.4 kg 93.4 kg    Physical Exam:  General exam: sleeping comfortably, no acute distress Respiratory system: normal respiratory effort, on room air. Cardiovascular system: tachycardic, A-flutter in 110's to low 120's on monitor   Central nervous system: no gross focal neurologic deficits on inspection Skin: dry, intact, normal temperature,  wound exam deferred to CCS   Labs   Data Reviewed: I have personally reviewed following labs and imaging studies  CBC: Recent Labs  Lab 01/10/21 0300 01/11/21 0114 01/12/21 0113 01/13/21 0256 01/14/21  0842  WBC 10.2 6.9 5.3 6.1 4.5  HGB 7.7* 7.5* 7.6* 7.8* 7.9*  HCT 24.2* 22.4* 24.5* 24.1* 24.9*  MCV 100.4* 98.7 102.9* 101.3* 102.0*  PLT 336 347 366 337 330   Basic Metabolic Panel: Recent Labs  Lab 01/08/21 0153 01/09/21 0856 01/10/21 0300 01/11/21 0114 01/12/21 0113 01/13/21 0256 01/14/21 0842  NA 132* 134* 136 139 139 140 138  K 4.3 4.0 4.2 3.9 3.9 3.5 3.9  CL 99 101 102 104 106 105 105  CO2 24 24 26 25 26 25 25   GLUCOSE 166* 257* 147* 132* 186* 116* 214*  BUN 15 24* 29* 24* 26* 23* 19  CREATININE 0.95 1.35* 1.32* 1.24* 1.20* 1.05* 1.19*  CALCIUM 8.3* 8.0* 8.2* 8.7* 8.6* 8.8* 8.5*  MG 2.3  --   --   --  1.7  --  1.9  PHOS 2.3* 4.4  --   --  3.9  --   --    GFR: Estimated Creatinine Clearance: 62.2 mL/min (A) (by C-G formula based on SCr of 1.19 mg/dL (H)). Liver Function Tests: Recent Labs  Lab 01/08/21 0153 01/09/21 0856  ALBUMIN 2.4* 2.1*   No results for input(s): LIPASE, AMYLASE in the last 168 hours. No results for input(s): AMMONIA in the last 168 hours. Coagulation Profile: No results for input(s): INR, PROTIME in the last 168 hours. Cardiac Enzymes: No results for input(s): CKTOTAL, CKMB, CKMBINDEX, TROPONINI in the last 168 hours. BNP (last 3 results) No results for input(s): PROBNP in the last 8760 hours. HbA1C: No results for input(s): HGBA1C in the last 72 hours. CBG: Recent Labs  Lab 01/13/21 1931 01/13/21 2338 01/14/21 0726 01/14/21 1114 01/14/21 1514  GLUCAP 149* 129* 186* 164* 189*   Lipid Profile: No results for input(s): CHOL, HDL, LDLCALC, TRIG, CHOLHDL, LDLDIRECT in the last 72 hours. Thyroid Function Tests: No results for input(s): TSH, T4TOTAL, FREET4, T3FREE, THYROIDAB in the last 72 hours. Anemia Panel: No results  for input(s): VITAMINB12, FOLATE, FERRITIN, TIBC, IRON, RETICCTPCT in the last 72 hours. Sepsis Labs: No results for input(s): PROCALCITON, LATICACIDVEN in the last 168 hours.  No results found for this or any previous visit (from the past 240 hour(s)).     Imaging Studies   DG Swallowing Func-Speech Pathology  Result Date: 01/14/2021 Formatting of this result is different from the original. Objective Swallowing Evaluation: Type of Study: MBS-Modified Barium Swallow Study  Patient Details Name: Diani Jillson MRN: Eulis Manly Date of Birth: [birth year] Today's Date: 01/14/2021 Time: SLP Start Time (ACUTE ONLY): 1339 -SLP Stop Time (ACUTE ONLY): 1358 SLP Time Calculation (min) (ACUTE ONLY): 19.9 min Past Medical History: Past Medical History: Diagnosis Date  Depression   DM (diabetes mellitus) (HCC)   GERD (gastroesophageal reflux disease)   HLD (hyperlipidemia)   Obesity   Tobacco abuse  Past Surgical History: Past Surgical History: Procedure Laterality Date  ABCESS DRAINAGE  11/2020  bartholin gland cyst  INCISION AND DRAINAGE ABSCESS Left 12/31/2020  Procedure: INCISION AND DRAINAGE ABSCESS BUTTOCK;  Surgeon: 03/02/2021, MD;  Location: [facility] OR;  Service: General;  Laterality: Left;  IRRIGATION AND DEBRIDEMENT BUTTOCKS Left 12/28/2020  Procedure: DEBRIDEMENT LEFT BUTTOCK NECROTIZING SOFT TISSUE INFECTION - TOTAL TISSUE VOLUME 14CM X 12CM X 3CM DEEP;  Surgeon: 02/27/2021, MD;  Location: [facility] OR;  Service: General;  Laterality: Left;  WOUND DEBRIDEMENT Left 01/03/2021  Procedure: IRRIGATION AND DEBRIDEMENT LEFT BUTTOCK;  Surgeon: 03/05/2021, MD;  Location[facility] OR;  Service: General;  Laterality: Left; HPI:  50 y/o female initially admitted to [place] on 5/29 complaining of fever and a buttock abscess, moved to [facility] on 6/3 in the setting of worsening renal failure, acidosis, encephaloapthy with known group B strep wound culture. ETT 6/4-6/12, hx GERD, obesity.  Subjective: alert, upright in chair for  procedure Assessment / Plan / Recommendation CHL IP CLINICAL IMPRESSIONS 01/14/2021 Clinical Impression Pt presents with mild pharyngeal dysphagia characterized by impairments in pharyngeal timing. She demonstrated occasional penetration (PAS 2) with larger boluses of thin and nectar thick liquids and this progressed twice to PAS 3 with consecutive swallows of thin liquids via straw. Pt was only able to swallow the 13mm barium tablet with regular texture solids; use of puree and thin liquids were unsucessful. Pt has demonstrated improvement in swallow function, and her diet will be advanced to regular texture solids and thin liquids. SLP will follow briefly to ensure tolerance of the advanced diet, but it is anticipated that further SLP services will not clinically indicated thereafter for swallowing. SLP Visit Diagnosis Dysphagia, pharyngeal phase (R13.13) Attention and concentration deficit following -- Frontal lobe and executive function deficit following -- Impact on safety and function Mild aspiration risk   CHL IP TREATMENT RECOMMENDATION 01/14/2021 Treatment Recommendations Therapy as outlined in treatment plan below   Prognosis 01/14/2021 Prognosis for Safe Diet Advancement Good Barriers to Reach Goals Time post onset Barriers/Prognosis Comment -- CHL IP DIET RECOMMENDATION 01/14/2021 SLP Diet Recommendations Regular solids;Thin liquid Liquid Administration via Cup;Straw Medication Administration Whole meds with puree Compensations Small sips/bites Postural Changes Seated upright at 90 degrees   CHL IP OTHER RECOMMENDATIONS 01/14/2021 Recommended Consults -- Oral Care Recommendations Oral care BID;Patient independent with oral care Other Recommendations --   CHL IP FOLLOW UP RECOMMENDATIONS 01/14/2021 Follow up Recommendations None   CHL IP FREQUENCY AND DURATION 01/14/2021 Speech Therapy Frequency (ACUTE ONLY) min 1 x/week Treatment Duration 1 week      CHL IP ORAL PHASE 01/14/2021 Oral Phase WFL Oral - Pudding  Teaspoon -- Oral - Pudding Cup -- Oral - Honey Teaspoon -- Oral - Honey Cup -- Oral - Nectar Teaspoon -- Oral - Nectar Cup -- Oral - Nectar Straw -- Oral - Thin Teaspoon -- Oral - Thin Cup -- Oral - Thin Straw -- Oral - Puree -- Oral - Mech Soft -- Oral - Regular -- Oral - Multi-Consistency -- Oral - Pill -- Oral Phase - Comment --  CHL IP PHARYNGEAL PHASE 01/14/2021 Pharyngeal Phase Impaired Pharyngeal- Pudding Teaspoon -- Pharyngeal -- Pharyngeal- Pudding Cup -- Pharyngeal -- Pharyngeal- Honey Teaspoon -- Pharyngeal -- Pharyngeal- Honey Cup NT Pharyngeal -- Pharyngeal- Nectar Teaspoon -- Pharyngeal -- Pharyngeal- Nectar Cup Delayed swallow initiation-vallecula;Delayed swallow initiation-pyriform sinuses Pharyngeal -- Pharyngeal- Nectar Straw -- Pharyngeal -- Pharyngeal- Thin Teaspoon -- Pharyngeal -- Pharyngeal- Thin Cup Delayed swallow initiation-pyriform sinuses Pharyngeal Material enters airway, remains ABOVE vocal cords then ejected out Pharyngeal- Thin Straw Delayed swallow initiation-pyriform sinuses;Penetration/Aspiration before swallow;Penetration/Aspiration during swallow Pharyngeal Material enters airway, remains ABOVE vocal cords then ejected out;Material enters airway, remains ABOVE vocal cords and not ejected out Pharyngeal- Puree Delayed swallow initiation-vallecula Pharyngeal -- Pharyngeal- Mechanical Soft -- Pharyngeal -- Pharyngeal- Regular WFL Pharyngeal -- Pharyngeal- Multi-consistency -- Pharyngeal -- Pharyngeal- Pill WFL Pharyngeal -- Pharyngeal Comment --  CHL IP CERVICAL ESOPHAGEAL PHASE 01/14/2021 Cervical Esophageal Phase WFL Pudding Teaspoon -- Pudding Cup -- Honey Teaspoon -- Honey Cup -- Nectar Teaspoon -- Nectar Cup -- Nectar Straw -- Thin Teaspoon -- Thin Cup -- Thin Straw -- Puree --  Mechanical Soft -- Regular -- Multi-consistency -- Pill -- Cervical Esophageal Comment -- Yvone NeuShanika I. Vear ClockPhillips, MS, CCC-SLP Acute Rehabilitation Services Office number [phone] Pager [phone]  Scheryl MartenShanika I Phillips 01/14/2021, 2:31 PM                Medications   Scheduled Meds:  amiodarone  400 mg Oral BID   apixaban  5 mg Oral BID   Chlorhexidine Gluconate Cloth  6 each Topical Q0600   clonazepam  1 mg Oral BID   docusate sodium  100 mg Oral BID   fentaNYL  1 patch Transdermal Q72H   insulin aspart  0-20 Units Subcutaneous TID WC   insulin aspart  3 Units Subcutaneous TID WC   insulin glargine  10 Units Subcutaneous BID   mouth rinse  15 mL Mouth Rinse BID   multivitamin with minerals  1 tablet Oral Daily   pantoprazole  40 mg Oral Daily   QUEtiapine  50 mg Oral QHS   sertraline  25 mg Oral Daily   sodium chloride flush  10-40 mL Intracatheter Q12H   Continuous Infusions:  sodium chloride 250 mL (01/07/21 1223)   feeding supplement (PIVOT 1.5 CAL) 1,000 mL (01/11/21 0555)   piperacillin-tazobactam (ZOSYN)  IV 3.375 g (01/14/21 1538)       LOS: 17 days    Time spent: 25 minutes with > 50% spent at bedside and in coordination of care.    Pennie BanterKelly A Gailen Venne, DO [facility]  01/14/2021, 5:34 PM      If 7PM-7AM, please contact night-coverage. How to contact the [facility] Attending or Consulting provider 7A - 7P or covering provider during after hours 7P -7A, for this patient?    Check the care team in [facility] and look for a) attending/consulting TR[facility] provider listed and b) the [facility] team listed Log into [email or web address] and use [place] universal password to access. If you do not have the password, please contact the hospital operator. Locate the [facility] provider you are looking for under [facility] and page to a number that you can be directly reached. If you still have difficulty reaching the provider, please page the [facility] (Director on Call) for the Hospitalists listed on amion for assistance.

## 2021-01-14 NOTE — Plan of Care (Signed)

## 2021-01-14 NOTE — Progress Notes (Signed)
Occupational Therapy Treatment Patient Details Name: [name] [name] MRN: [id number] DOB: 04-13-1971 Today's Date: 01/14/2021    History of present illness 50 y.o. female presenting from [facility] on 6/3 with encephalopathy, polymicrobial necrotizing skin and soft tissue infection of the left buttock/posterior thigh with TSS s/p debridement 6/3 and 6/6, septic shock, heart failure possibly A-fib with RVR induced and AKI. CCRT 6/3-6/14; extubated 6/12; fall out of bed 6/15 with (-) head CT. PMHx significant for DMII, anxiety and new onset A-fib.   OT comments  This 50 yo female admitted with above seen today in conjunction with PT to see if we could progress ambulation, which did do well with and could have done more but flexiseal came out during session. She is making great progress with mobility and basic ADLs. She will continue to benefit from acute OT with follow up at SNF.  Follow Up Recommendations  SNF;Supervision/Assistance - 24 hour    Equipment Recommendations  Other (comment);3 in 1 bedside commode (TBD next venue)       Precautions / Restrictions Precautions Precautions: Fall Precaution Comments: High fall risk, rectal tube, huge left buttock wound Restrictions Weight Bearing Restrictions: No       Mobility Bed Mobility Overal bed mobility: Needs Assistance Bed Mobility: Supine to Sit;Rolling;Sidelying to Sit;Sit to Supine Rolling: Min assist;+2 for safety/equipment Sidelying to sit: HOB elevated;Mod assist;+2 for physical assistance;Min assist   Sit to supine: +2 for physical assistance;Min assist   General bed mobility comments: Needed mod assist to come to eOB as pt not following cues for safety.  Pt scoots impulsively needing close guard assist when sitting on EOB.  She will scoot forward and stand without warning. min  assist to lie down however laid on side and impulsive so again needs close guard assist for safety.  Flexi seal came out while walking  therfore alot of clean up involved with nursing coming in to assist.    Transfers Overall transfer level: Needs assistance Equipment used: 2 person hand held assist Transfers: Sit to/from [employer] Transfers Sit to Stand: Min assist;+2 safety/equipment         General transfer comment: Pt needed min assist to stand from bed due to impulsivity. Pt does not hold onto RW and is constantly fidgeting with lines, flexing at trunk leaning on RW and not following safety cues.  Needs min assist for transitions due to impulsivity and poor safety awareness.    Balance Overall balance assessment: Needs assistance Sitting-balance support: Bilateral upper extremity supported;Feet unsupported Sitting balance-Leahy Scale: Fair Sitting balance - Comments: EOB pt did not need UE support, but needed guard assist for safety due to impulsivitiy.   Standing balance support: Bilateral upper extremity supported;During functional activity Standing balance-Leahy Scale: Poor Standing balance comment: Reliant on external assist and bil UE support  to maintain static standing balance.                           ADL either performed or assessed with clinical judgement   ADL Overall ADL's : Needs assistance/impaired                     Lower Body Dressing: Min guard Lower Body Dressing Details (indicate cue type and reason): sitting at EOB, crossing legs to don socks Toilet Transfer: Minimal assistance;+2 for safety/equipment;+2 for physical assistance;RW Toilet Transfer Details (indicate cue type and reason): Bed>around to sink to stand for back peri care (rectal tube  came out)>bed to lay down and have RN change dressings Toileting- Clothing Manipulation and Hygiene: Total assistance Toileting - Clothing Manipulation Details (indicate cue type and reason): due to buttock wound             Vision Patient Visual Report: No change from baseline            Cognition  Arousal/Alertness: Awake/alert;Lethargic (Lethargic initially) Behavior During Therapy: Impulsive Overall Cognitive Status: Impaired/Different from baseline Area of Impairment: Following commands;Safety/judgement;Awareness;Problem solving                       Following Commands: Follows one step commands consistently;Follows multi-step commands with increased time Safety/Judgement: Decreased awareness of safety Awareness: Emergent Problem Solving: Slow processing General Comments: Patient impulsive. Mod to max cues for safety. Decr safety as she fatigues              General Comments VSS    Pertinent Vitals/ Pain       Pain Assessment: Faces Faces Pain Scale: Hurts even more Pain Location: left buttock Pain Descriptors / Indicators: Aching;Sore;Throbbing Pain Intervention(s): Limited activity within patient's tolerance;Monitored during session;Repositioned (RN changed dressing)         Frequency  Min 2X/week        Progress Toward Goals  OT Goals(current goals can now be found in the care plan section)  Progress towards OT goals: Progressing toward goals  Acute Rehab OT Goals Patient Stated Goal: To get better. OT Goal Formulation: With patient Time For Goal Achievement: 01/24/21 Potential to Achieve Goals: Good  Plan Discharge plan remains appropriate    Co-evaluation    PT/OT/SLP Co-Evaluation/Treatment: Yes Reason for Co-Treatment: For patient/therapist safety;To address functional/ADL transfers PT goals addressed during session: Mobility/safety with mobility;Balance;Strengthening/ROM;Proper use of DME OT goals addressed during session: Strengthening/ROM;ADL's and self-care      AM-PAC OT "6 Clicks" Daily Activity     Outcome Measure   Help from another person eating meals?: None Help from another person taking care of personal grooming?: A Little Help from another person toileting, which includes using toliet, bedpan, or urinal?: Total Help  from another person bathing (including washing, rinsing, drying)?: A Little Help from another person to put on and taking off regular upper body clothing?: A Little Help from another person to put on and taking off regular lower body clothing?: A Little 6 Click Score: 17    End of Session Equipment Utilized During Treatment: Gait belt  OT Visit Diagnosis: Unsteadiness on feet (R26.81);Muscle weakness (generalized) (M62.81);History of falling (Z91.81);Other symptoms and signs involving cognitive function   Activity Tolerance Patient tolerated treatment well   Patient Left in bed;with call bell/phone within reach   Nurse Communication  (flexi-seal came out)        Time: 9163-8466 OT Time Calculation (min): 43 min  Charges: OT General Charges $OT Visit: 1 Visit OT Treatments $Self Care/Home Management : 8-22 mins  Ignacia Palma, OTR/L Acute [employer] Pager [phone] Office [phone]     Evette Georges 01/14/2021, 12:31 PM

## 2021-01-14 NOTE — Progress Notes (Signed)
11 Days Post-Op   Subjective/Chief Complaint: Crying, wants to go home for a one day hall pass type scenario.  Confused at times though as she thinks it is Friday.  Wants to be home with her son for Father's Day.  Wasn't aware it was yesterday.  Objective: Vital signs in last 24 hours: Temp:  [98 F (36.7 C)-98.8 F (37.1 C)] 98 F (36.7 C) (06/20 0806) Pulse Rate:  [121-129] 123 (06/20 1000) Resp:  [0-38] 16 (06/20 1000) BP: (91-126)/(52-95) 121/70 (06/20 1000) SpO2:  [88 %-98 %] 96 % (06/20 1000) FiO2 (%):  [2 %] 2 % (06/20 0000) Last BM Date: 01/14/21  Intake/Output from previous day: 06/19 0701 - 06/20 0700 In: 97.9 [IV Piggyback:97.9] Out: 1585 [Urine:1585] Intake/Output this shift: No intake/output data recorded.   Incision/Wound: overall very clean with no purulent drainage.  Small amount of fibrin at superior medial aspect of wound      Lab Results:  Recent Labs    01/13/21 0256 01/14/21 0842  WBC 6.1 4.5  HGB 7.8* 7.9*  HCT 24.1* 24.9*  PLT 337 330   BMET Recent Labs    01/12/21 0113 01/13/21 0256  NA 139 140  K 3.9 3.5  CL 106 105  CO2 26 25  GLUCOSE 186* 116*  BUN 26* 23*  CREATININE 1.20* 1.05*  CALCIUM 8.6* 8.8*   PT/INR  Anti-infectives: Anti-infectives (From admission, onward)    Start     Dose/Rate Route Frequency Ordered Stop   01/10/21 2200  piperacillin-tazobactam (ZOSYN) IVPB 3.375 g        3.375 g 12.5 mL/hr over 240 Minutes Intravenous Every 8 hours 01/10/21 1421 01/17/21 2359   01/09/21 2200  piperacillin-tazobactam (ZOSYN) IVPB 3.375 g  Status:  Discontinued        3.375 g 12.5 mL/hr over 240 Minutes Intravenous Every 12 hours 01/09/21 1146 01/10/21 1421   01/08/21 1400  piperacillin-tazobactam (ZOSYN) IVPB 3.375 g  Status:  Discontinued        3.375 g 12.5 mL/hr over 240 Minutes Intravenous Every 8 hours 01/08/21 0902 01/09/21 1146   01/04/21 1800  piperacillin-tazobactam (ZOSYN) IVPB 3.375 g  Status:  Discontinued         3.375 g 100 mL/hr over 30 Minutes Intravenous Every 6 hours 01/04/21 1059 01/08/21 0902   01/03/21 1400  piperacillin-tazobactam (ZOSYN) IVPB 3.375 g  Status:  Discontinued        3.375 g 12.5 mL/hr over 240 Minutes Intravenous Every 8 hours 01/03/21 1124 01/04/21 1059   01/01/21 1800  piperacillin-tazobactam (ZOSYN) IVPB 3.375 g  Status:  Discontinued        3.375 g 12.5 mL/hr over 240 Minutes Intravenous Every 6 hours 01/01/21 1059 01/03/21 1124   12/31/20 1400  piperacillin-tazobactam (ZOSYN) IVPB 3.375 g  Status:  Discontinued        3.375 g 12.5 mL/hr over 240 Minutes Intravenous Every 6 hours 12/31/20 0918 01/01/21 1059   12/29/20 1400  clindamycin (CLEOCIN) IVPB 900 mg  Status:  Discontinued        900 mg 100 mL/hr over 30 Minutes Intravenous Every 8 hours 12/29/20 1214 12/29/20 1234   12/28/20 2200  linezolid (ZYVOX) IVPB 600 mg  Status:  Discontinued        600 mg 300 mL/hr over 60 Minutes Intravenous Every 12 hours 12/28/20 1620 01/02/21 0920   12/28/20 1830  meropenem (MERREM) 1 g in sodium chloride 0.9 % 100 mL IVPB  Status:  Discontinued  1 g 200 mL/hr over 30 Minutes Intravenous Every 8 hours 12/28/20 1731 12/31/20 0918   12/28/20 1800  meropenem (MERREM) 1 g in sodium chloride 0.9 % 100 mL IVPB  Status:  Discontinued        1 g 200 mL/hr over 30 Minutes Intravenous Every 24 hours 12/28/20 1700 12/28/20 1731   12/28/20 1730  meropenem (MERREM) 1 g in sodium chloride 0.9 % 100 mL IVPB  Status:  Discontinued        1 g 200 mL/hr over 30 Minutes Intravenous Every 8 hours 12/28/20 1639 12/28/20 1700       Assessment/Plan: POD#17, 14, 11 S/P DEBRIDEMENT LEFT BUTTOCK NECROTIZING SOFT TISSUE INFECTION by Dr. Janee Morn, Dr. Derrell Lolling x2 - Intraoperative cultures 6/3 with GBS, E. coli, and Prevotella - Wound appears clean and is responding well to hydrotherapy, continue hydro PT.  -will recheck wound on Friday and then likely have her follow up with wound care center as  outpatient when she is stable for DC. -she is otherwise surgically stable for DC to [facility] when felt medically stable. -flexi-seal came out with some stool contamination, but wound not recommend diverting ostomy at this point.  Continue local wound care and dressing changes prn contamination.  Below per CCM: Acute hypoxic respiratory failure, resolved Septic shock Renal failure - per nephrology, CRRT stopped yesterday 6/14 per renal, now making urine DM2 Obesity A. Flutter with RVR - started on Eliquis 6/17   FEN: DYS3 ID:  merrem 6/3-6/6, linezolid 6/3-6/8, Zosyn 6/6 >> abx per ID VTE: SCD's, Eliquis per tube Foley: in place for strict I&O's, placed 6/3    LOS: 17 days    Letha Cape 01/14/2021

## 2021-01-14 NOTE — Progress Notes (Signed)
Physical Therapy Treatment Patient Details Name: [name] [name] MRN: [id number] DOB: Feb 09, 1971 Today's Date: 01/14/2021    History of Present Illness 50 y.o. female presenting from [facility] on 6/3 with encephalopathy, polymicrobial necrotizing skin and soft tissue infection of the left buttock/posterior thigh with TSS s/p debridement 6/3 and 6/6, septic shock, heart failure possibly A-fib with RVR induced and AKI. CCRT 6/3-6/14; extubated 6/12; fall out of bed 6/15 with (-) head CT. PMHx significant for DMII, anxiety and new onset A-fib.    PT Comments    Pt admitted with above diagnosis. Pt was able to ambulate with RW with min assist of 2 and cues needing 2 persons for safety as pt with very poor safety awareness.  Pt limited today as flexi seal came out and incontinence an issue.  Will follow acutely. Progressing slowly.  Pt currently with functional limitations due to balance and endurance deficits. Pt will benefit from skilled PT to increase their independence and safety with mobility to allow discharge to the venue listed below.      Follow Up Recommendations  LTACH     Equipment Recommendations  Other (comment) (TBA)    Recommendations for Other Services       Precautions / Restrictions Precautions Precautions: Fall Precaution Comments: High fall risk Restrictions Weight Bearing Restrictions: No    Mobility  Bed Mobility Overal bed mobility: Needs Assistance Bed Mobility: Supine to Sit;Rolling;Sidelying to Sit;Sit to Supine Rolling: Min assist;+2 for safety/equipment Sidelying to sit: HOB elevated;Mod assist;+2 for physical assistance;Min assist   Sit to supine: +2 for physical assistance;Min assist   General bed mobility comments: Needed mod assist to come to eOB as pt not following cues for safety.  Pt scoots impulsively needing close guard assist when sitting on EOB.  She will scoot forward and stand without warning. min  assist to lie down however laid on  side and impulsive so again needs close guard assist for safety.  Flexi seal came out while walking therfore alot of clean up involved with nursing coming in to assist.    Transfers Overall transfer level: Needs assistance Equipment used: 2 person hand held assist Transfers: Sit to/from [employer] Transfers Sit to Stand: Min assist;+2 safety/equipment         General transfer comment: Pt needed min assist to stand from bed due to impulsivity. Pt does not hold onto RW and is constantly fidgeting with lines, flexing at trunk leaning on RW and not following safety cues.  Needs min assist for transitions due to impulsivity and poor safety awareness.  Ambulation/Gait Ambulation/Gait assistance: Min assist;+2 safety/equipment Gait Distance (Feet): 45 Feet Assistive device: Rolling walker (2 wheeled) Gait Pattern/deviations: Step-through pattern;Decreased stride length;Drifts right/left;Trunk flexed;Wide base of support   Gait velocity interpretation: <1.31 ft/sec, indicative of household ambulator General Gait Details: Initially was going to have pt walk to hallway however once pt took a few steps realized that flexi seal was leaking. Walked pt over to sink to clean her and quickly realized that flexi seal was coming out.  Pt stood for about 10 min while PT and OT cleaned her off.  then sat pt in the recliner and changed her socks.  then pt walked back to the bed and once she was asssisted to bed,called nursing and nursing changed bandage and PT and OT assisted with cleaning pt fully. Her flexi seal was out and nurse aware.  Pt was left on her right side and comfortable.   Stairs  Wheelchair Mobility    Modified Rankin (Stroke Patients Only)       Balance Overall balance assessment: Needs assistance Sitting-balance support: Bilateral upper extremity supported;Feet unsupported Sitting balance-Leahy Scale: Fair Sitting balance - Comments: EOB pt did not need UE  support, but needed guard assist for safety due to impulsivitiy.   Standing balance support: Bilateral upper extremity supported;During functional activity Standing balance-Leahy Scale: Poor Standing balance comment: Reliant on external assist and bil UE support  to maintain static standing balance.                            Cognition Arousal/Alertness: Awake/alert;Lethargic (Lethargic initially) Behavior During Therapy: Impulsive Overall Cognitive Status: Impaired/Different from baseline Area of Impairment: Following commands;Safety/judgement;Awareness;Problem solving                       Following Commands: Follows one step commands consistently;Follows multi-step commands with increased time Safety/Judgement: Decreased awareness of safety Awareness: Emergent Problem Solving: Slow processing General Comments: Patient impulsive. Mod to max cues for safety. Decr safety as she fatigues      Exercises      General Comments General comments (skin integrity, edema, etc.): VSS      Pertinent Vitals/Pain Pain Assessment: Faces Faces Pain Scale: Hurts even more Pain Location: buttocks Pain Descriptors / Indicators: Aching;Sore;Throbbing Pain Intervention(s): Limited activity within patient's tolerance;Monitored during session;Repositioned    Home Living                      Prior Function            PT Goals (current goals can now be found in the care plan section) Acute Rehab PT Goals Patient Stated Goal: To get better. Progress towards PT goals: Progressing toward goals    Frequency    Min 3X/week      PT Plan Current plan remains appropriate    Co-evaluation              AM-PAC PT "6 Clicks" Mobility   Outcome Measure  Help needed turning from your back to your side while in a flat bed without using bedrails?: A Little Help needed moving from lying on your back to sitting on the side of a flat bed without using bedrails?:  A Little Help needed moving to and from a bed to a chair (including a wheelchair)?: A Lot Help needed standing up from a chair using your arms (e.g., wheelchair or bedside chair)?: A Little Help needed to walk in hospital room?: A Lot Help needed climbing 3-5 steps with a railing? : Total 6 Click Score: 14    End of Session Equipment Utilized During Treatment: Gait belt Activity Tolerance: Patient limited by fatigue (limited by incontinence) Patient left: in bed;with call bell/phone within reach;with bed alarm set Nurse Communication: Mobility status;Need for lift equipment PT Visit Diagnosis: Unsteadiness on feet (R26.81);Muscle weakness (generalized) (M62.81)     Time: 5364-6803 PT Time Calculation (min) (ACUTE ONLY): 42 min  Charges:  $Gait Training: 8-22 mins $Therapeutic Activity: 8-22 mins                     Correll Denbow M,PT Acute Rehab Services [phone] (pager)    [name] 01/14/2021, 10:18 AM

## 2021-01-14 NOTE — Progress Notes (Signed)
Modified Barium Swallow Progress Note  Patient Details  Name: [name] [name] MRN: [id number] Date of Birth: 30-Aug-1970  Today's Date: 01/14/2021  Modified Barium Swallow completed.  Full report located under Chart Review in the Imaging Section.  Brief recommendations include the following:  Clinical Impression  Pt presents with mild pharyngeal dysphagia characterized by impairments in pharyngeal timing. She demonstrated occasional penetration (PAS 2) with larger boluses of thin and nectar thick liquids and this progressed twice to PAS 3 with consecutive swallows of thin liquids via straw. Pt was only able to swallow the 40mm barium tablet with regular texture solids; use of puree and thin liquids were unsucessful. Pt has demonstrated improvement in swallow function, and her diet will be advanced to regular texture solids and thin liquids. SLP will follow briefly to ensure tolerance of the advanced diet, but it is anticipated that further SLP services will not clinically indicated thereafter for swallowing.   Swallow Evaluation Recommendations       SLP Diet Recommendations: Regular solids;Thin liquid   Liquid Administration via: Cup;Straw   Medication Administration: Whole meds with puree   Supervision: Patient able to self feed   Compensations: Small sips/bites   Postural Changes: Seated upright at 90 degrees   Oral Care Recommendations: Oral care BID;Patient independent with oral care      [name] [name] I. Vear Clock, MS, CCC-SLP Acute Rehabilitation Services Office number [phone] Pager [phone]  Scheryl Marten 01/14/2021,2:27 PM

## 2021-01-14 NOTE — Progress Notes (Signed)
  Speech Language Pathology Treatment: Dysphagia  Patient Details Name: [name] [name] MRN: [id number] DOB: January 24, 1971 Today's Date: 01/14/2021 Time: 4034-7425 SLP Time Calculation (min) (ACUTE ONLY): 14.78 min  Assessment / Plan / Recommendation Clinical Impression   Pt was seen for dysphagia treatment. She was alert and cooperative during the session. She became tearful during the session when recalling her husband's advise for her to take care of herself, but this improved as the session progressed. Pt requested that solids be deferred since she was full. She tolerated thin liquids without overt s/sx of aspiration. However, silent aspiration was noted with this consistency during the modified barium swallow study. A repeat MBS is therefore recommended to more objectively reassess swallow function; it is currently scheduled for today at 1330.    HPI HPI: 50 y/o female initially admitted to [place] on 5/29 complaining of fever and a buttock abscess, moved to [facility] on 6/3 in the setting of worsening renal failure, acidosis, encephaloapthy with known group B strep wound culture. ETT 6/4-6/12, hx GERD, obesity.      SLP Plan  MBS       Recommendations  Diet recommendations: Dysphagia 3 (mechanical soft) Liquids provided via: Cup;Straw Medication Administration: Via alternative means Supervision: Staff to assist with self feeding Compensations: Slow rate;Small sips/bites;Clear throat intermittently;Minimize environmental distractions                Oral Care Recommendations: Oral care QID;Oral care prior to ice chip/H20 Follow up Recommendations: Other (comment) (TBD per PT/OT recommendations) SLP Visit Diagnosis: Dysphagia, oropharyngeal phase (R13.12) Plan: MBS       Anthony Tamburo I. Vear Clock, MS, CCC-SLP Acute Rehabilitation Services Office number [phone] Pager [phone]                Scheryl Marten 01/14/2021, 9:11 AM

## 2021-01-15 DIAGNOSIS — M7989 Other specified soft tissue disorders: Secondary | ICD-10-CM | POA: Diagnosis not present

## 2021-01-15 DIAGNOSIS — E1165 Type 2 diabetes mellitus with hyperglycemia: Secondary | ICD-10-CM | POA: Diagnosis not present

## 2021-01-15 DIAGNOSIS — G9341 Metabolic encephalopathy: Secondary | ICD-10-CM | POA: Diagnosis not present

## 2021-01-15 LAB — BASIC METABOLIC PANEL
Anion gap: 9 (ref 5–15)
BUN: 17 mg/dL (ref 6–20)
CO2: 24 mmol/L (ref 22–32)
Calcium: 8.5 mg/dL — ABNORMAL LOW (ref 8.9–10.3)
Chloride: 105 mmol/L (ref 98–111)
Creatinine, Ser: 1.05 mg/dL — ABNORMAL HIGH (ref 0.44–1.00)
GFR, Estimated: >60 mL/min (ref >60)
Glucose, Bld: 142 mg/dL — ABNORMAL HIGH (ref 70–99)
Potassium: 4 mmol/L (ref 3.5–5.1)
Sodium: 138 mmol/L (ref 135–145)

## 2021-01-15 LAB — GLUCOSE, CAPILLARY
Glucose-Capillary: 98 mg/dL (ref 70–99)
Glucose-Capillary: 94 mg/dL (ref 70–99)
Glucose-Capillary: 89 mg/dL (ref 70–99)
Glucose-Capillary: 90 mg/dL (ref 70–99)
Glucose-Capillary: 82 mg/dL (ref 70–99)

## 2021-01-15 LAB — CBC
HCT: 23.3 % — ABNORMAL LOW (ref 36.0–46.0)
Hemoglobin: 7.5 g/dL — ABNORMAL LOW (ref 12.0–15.0)
MCH: 32.6 pg (ref 26.0–34.0)
MCHC: 32.2 g/dL (ref 30.0–36.0)
MCV: 101.3 fL — ABNORMAL HIGH (ref 80.0–100.0)
Platelets: 277 10*3/uL (ref 150–400)
RBC: 2.3 MIL/uL — ABNORMAL LOW (ref 3.87–5.11)
RDW: 16 % — ABNORMAL HIGH (ref 11.5–15.5)
WBC: 4 10*3/uL (ref 4.0–10.5)
nRBC: 0 % (ref 0.0–0.2)

## 2021-01-15 LAB — MAGNESIUM: Magnesium: 2 mg/dL (ref 1.7–2.4)

## 2021-01-15 MED ORDER — ENSURE ENLIVE PO LIQD
237.0000 mL | Freq: Three times a day (TID) | ORAL | Status: DC
  Administered 2021-01-16 – 2021-01-18 (×4): 237 mL via ORAL

## 2021-01-15 NOTE — Progress Notes (Signed)
PROGRESS NOTE    [name] [name]   JSH:[id number]  DOB: [birth year]  PCP: Pcp, No    DOA: 12/28/2020 LOS: 18   Assessment & Plan   Principal Problem:   Necrotizing soft tissue infection Active Problems:   Septic shock (HCC)   Sepsis due to Streptococcus, group B (HCC)   Acute kidney injury (HCC)   Obesity   Type 2 diabetes mellitus with hyperglycemia (HCC)   Acute hypercapnic respiratory failure (HCC)   Typical atrial flutter (HCC)   Hyperkalemia   New onset a-fib (HCC)   Acute combined systolic and diastolic heart failure (HCC)   Acute metabolic encephalopathy   History of ETT   Encounter for central line placement   Encounter for orogastric (OG) tube placement   Septic shock secondary to polymicrobial necrotizing soft tissue infection -improving, off pressors --Continue Zosyn (x 2 weeks after 6/9 presenting this source controlled) --Infectious disease following --Rectal tube and Foley to prevent wound contamination and allow healing; patient incontinent of both -- Hydrotherapy for wound   Persistent encephalopathy due to ICU delirium - with underlying depression and anxiety. Per pharmacist and bedside RN, current medication regimen seems to be finally controlling her agitation.   --Continue current regimen:  Klonopin disintegrating tablet 1 mg twice daily IV Haldol as needed Pain control including fentanyl patch  PRN oxycodone, PRN IV Ativan  History of anxiety depression  - had substituted Cymbalta with Zoloft for administration per tube.  Only on Zoloft a few days. --Resume Cymbalta  Acute renal failure due to septic ATN -resolved.  Required CRRT, off since 6/14. Hyperkalemia --Nephrology consulted --Monitor renal function  Acute blood loss anemia -Improving.  In the setting of extensive surgery.  Last transfusion 6/15.  May have some myelosuppression as well as small wound losses ongoing Hbg's fairly stable in 7's since resumed on Eliquis.  ?heart failure  -most likely due to rapid flutter Atrial Flutter with RVR -in setting of above. Cardiology was consulted earlier, signed off. 6/18 - HR's sustaining in 130's again. Increased amio to 400 mg BID after d/w cardiology 6/19-21 - HR's better lower 100s-120 and stable on amio 400 bid and off drip --Resumed on Eliquis, monitor for bleeding. --Continue on amiodarone 400 mg BID --Resume on amio drip if sustaining HR's upper 120's-130's or higher.  Would re-consult cardiology also. --maintain K>4, Mg>2  Severe physical deconditioning and generalized weakness --PT and OT recommending LTAC. Patient's complex wound care and physical rehab needs likely make her a good candidate. --TOC following  Insulin-dependent type 2 diabetes -continue Lantus 10 unit twice daily, NovoLog 3 unit TID WC, sliding scale NovoLog.  Hypoglycemia protocol.  Dysphagia -pt pulled out core track 6/18 and refuses any NG tube replacment (would have to restrain her).   SLP following appreciate input - diet per SLP --Modified Barrium Swallow 6/20 - see report --Diet advanced to regular, thin liquids 6/20 --Encourage PO intake now off tube feeds.    Nutrition -tube feeds per orders, dietitian following. Free water flushes added, monitor and adjust as needed.  Diarrhea -most likely tube feed related.  Rectal tube in place.  Would keep this in the interest of preventing wound contamination.  Obesity: Body mass index is 37.66 kg/m.  Complicates overall care and prognosis.  Recommend lifestyle modifications including physical activity and diet for weight loss and overall long-term health.   DVT prophylaxis: SCDs Start: 12/28/20 1523 apixaban (ELIQUIS) tablet 5 mg   Diet:  Diet Orders (From admission, onward)  Start     Ordered   01/14/21 1433  Diet regular Room service appropriate? Yes with Assist; Fluid consistency: Thin  Diet effective now       Question Answer Comment  Room service appropriate? Yes with Assist    Fluid consistency: Thin      01/14/21 1432              Code Status: Full Code   Brief Narrative / Hospital Course to Date:   50 y/o female with PMHx of HTN, type 2 DM, GERD, anxiety and recent vulvar irritation due to Bartholin cyst for which she'd been seen at [facility], admitted as a transfer from [place] where she presented on 12/23/20 with fever and a buttock abscess, worsening renal failure, metabolic acidosis and encephalopathy due to group B strep wound infection.  Admitted to ICU in septic shock.  Hospital course complicated by acute renal failure due to ischemic ATN requiring CRRT, new onset A-fib with RVR, acute respiratory failure with hypoxia requiring intubation, and ongoing metabolic encephalopathy with superimposed ICU delirium.    Significant Events as per PCCM through 6/16: 5/29 admitted to [place] sepsis felt 2/2 cellulitis on buttocks. Cultures sent. CT abd/pelvis +  gas but no drainable abscess. Started on Vanc. IVFs. Wound also cultured. 6/1 wound culture back + group B strep. 1 of 4 blood cultures + GPC. ABX changed to rocephin 6/3 Arrived at [facility] from [place] w/ new hypotension, worsening renal failure, worsening leukocytosis, poorly controlled pain and new encephalopathy. Admitted to ICU. Started on zyvox and meropenem. Surgical consult requested> to OR for debridement 6/3 started CRRT, meropenem and Linezolid added 6/4 IVIg >6/6 6/5 Meropenem stopped 6/6 zosyn added 6/6 back to OR for debridement, in and out of atrial flutter; QTc 590; cardiology consult> amiodarone 6/7 off vasopressors, continues to have atrial flutter, started ketamine, Linezolid stopped 6/8 started on metoprolol, echo with LVEF 45-50%. 6/9 OR debridement 6/11 Fever curve improved, remains on CRRT 6/12 extubated 6/13 remains on CRRT 6/14 CRRT stopped, continues to have pain and agitation issues 6/15 agitated, fell out of bed, reported that she hit her head, head CT neg 6/16 much  better with psych med changes  Transferred to [facility] service on 01/11/21.    Subjective 01/15/21    Pt awake with PT's at bedside about to do her wound care.  Pt reports feeling well and denies any acute complaints.  Moved up to floor very late in evening yesterday.    Disposition Plan & Communication   Status is: Inpatient  Remains inpatient appropriate because:Inpatient level of care appropriate due to severity of illness  Dispo: The patient is from: Home              Anticipated d/c is to: LTAC              Patient currently is not medically stable to d/c.   Difficult to place patient No  Family Communication: None present   Consults, Procedures, Significant Events   Consultants:  Nephrology PCCM Infectious disease Cardiology General surgery  Procedures:  As listed above  Antimicrobials:  Anti-infectives (From admission, onward)    Start     Dose/Rate Route Frequency Ordered Stop   01/10/21 2200  piperacillin-tazobactam (ZOSYN) IVPB 3.375 g        3.375 g 12.5 mL/hr over 240 Minutes Intravenous Every 8 hours 01/10/21 1421 01/17/21 2359   01/09/21 2200  piperacillin-tazobactam (ZOSYN) IVPB 3.375 g  Status:  Discontinued  3.375 g 12.5 mL/hr over 240 Minutes Intravenous Every 12 hours 01/09/21 1146 01/10/21 1421   01/08/21 1400  piperacillin-tazobactam (ZOSYN) IVPB 3.375 g  Status:  Discontinued        3.375 g 12.5 mL/hr over 240 Minutes Intravenous Every 8 hours 01/08/21 0902 01/09/21 1146   01/04/21 1800  piperacillin-tazobactam (ZOSYN) IVPB 3.375 g  Status:  Discontinued        3.375 g 100 mL/hr over 30 Minutes Intravenous Every 6 hours 01/04/21 1059 01/08/21 0902   01/03/21 1400  piperacillin-tazobactam (ZOSYN) IVPB 3.375 g  Status:  Discontinued        3.375 g 12.5 mL/hr over 240 Minutes Intravenous Every 8 hours 01/03/21 1124 01/04/21 1059   01/01/21 1800  piperacillin-tazobactam (ZOSYN) IVPB 3.375 g  Status:  Discontinued        3.375 g 12.5 mL/hr over  240 Minutes Intravenous Every 6 hours 01/01/21 1059 01/03/21 1124   12/31/20 1400  piperacillin-tazobactam (ZOSYN) IVPB 3.375 g  Status:  Discontinued        3.375 g 12.5 mL/hr over 240 Minutes Intravenous Every 6 hours 12/31/20 0918 01/01/21 1059   12/29/20 1400  clindamycin (CLEOCIN) IVPB 900 mg  Status:  Discontinued        900 mg 100 mL/hr over 30 Minutes Intravenous Every 8 hours 12/29/20 1214 12/29/20 1234   12/28/20 2200  linezolid (ZYVOX) IVPB 600 mg  Status:  Discontinued        600 mg 300 mL/hr over 60 Minutes Intravenous Every 12 hours 12/28/20 1620 01/02/21 0920   12/28/20 1830  meropenem (MERREM) 1 g in sodium chloride 0.9 % 100 mL IVPB  Status:  Discontinued        1 g 200 mL/hr over 30 Minutes Intravenous Every 8 hours 12/28/20 1731 12/31/20 0918   12/28/20 1800  meropenem (MERREM) 1 g in sodium chloride 0.9 % 100 mL IVPB  Status:  Discontinued        1 g 200 mL/hr over 30 Minutes Intravenous Every 24 hours 12/28/20 1700 12/28/20 1731   12/28/20 1730  meropenem (MERREM) 1 g in sodium chloride 0.9 % 100 mL IVPB  Status:  Discontinued        1 g 200 mL/hr over 30 Minutes Intravenous Every 8 hours 12/28/20 1639 12/28/20 1700         Micro    Objective   Vitals:   01/15/21 0215 01/15/21 0230 01/15/21 0245 01/15/21 0321  BP:    119/79  Pulse: (!) 119 (!) 124 (!) 122 (!) 107  Resp: 19 20 (!) 22 17  Temp:    98.8 F (37.1 C)  TempSrc:    Oral  SpO2: 93% 94% 93% 97%  Weight:      Height:        Intake/Output Summary (Last 24 hours) at 01/15/2021 1332 Last data filed at 01/15/2021 0900 Gross per 24 hour  Intake 537 ml  Output 1975 ml  Net -1438 ml   Filed Weights   01/07/21 0459 01/08/21 0400 01/08/21 0800  Weight: 99.2 kg 93.4 kg 93.4 kg    Physical Exam:  General exam: alert and awake, no acute distress, conversational today Respiratory system: normal respiratory effort, on room air, CTAB no wheezes or rhonchi. Cardiovascular system: tachycardic, normal  S1-S1  Central nervous system: A&O, normal speech, moves all extremities Skin: medial lower legs b/l with faint patchy erythema ?developing urticaria, normal temperature, wound exam deferred to CCS   Labs  Data Reviewed: I have personally reviewed following labs and imaging studies  CBC: Recent Labs  Lab 01/11/21 0114 01/12/21 0113 01/13/21 0256 01/14/21 0842 01/15/21 0217  WBC 6.9 5.3 6.1 4.5 4.0  HGB 7.5* 7.6* 7.8* 7.9* 7.5*  HCT 22.4* 24.5* 24.1* 24.9* 23.3*  MCV 98.7 102.9* 101.3* 102.0* 101.3*  PLT 347 366 337 330 277   Basic Metabolic Panel: Recent Labs  Lab 01/09/21 0856 01/10/21 0300 01/11/21 0114 01/12/21 0113 01/13/21 0256 01/14/21 0842 01/15/21 0217  NA 134*   < > 139 139 140 138 138  K 4.0   < > 3.9 3.9 3.5 3.9 4.0  CL 101   < > 104 106 105 105 105  CO2 24   < > 25 26 25 25 24   GLUCOSE 257*   < > 132* 186* 116* 214* 142*  BUN 24*   < > 24* 26* 23* 19 17  CREATININE 1.35*   < > 1.24* 1.20* 1.05* 1.19* 1.05*  CALCIUM 8.0*   < > 8.7* 8.6* 8.8* 8.5* 8.5*  MG  --   --   --  1.7  --  1.9 2.0  PHOS 4.4  --   --  3.9  --   --   --    < > = values in this interval not displayed.   GFR: Estimated Creatinine Clearance: 70.5 mL/min (A) (by C-G formula based on SCr of 1.05 mg/dL (H)). Liver Function Tests: Recent Labs  Lab 01/09/21 0856  ALBUMIN 2.1*   No results for input(s): LIPASE, AMYLASE in the last 168 hours. No results for input(s): AMMONIA in the last 168 hours. Coagulation Profile: No results for input(s): INR, PROTIME in the last 168 hours. Cardiac Enzymes: No results for input(s): CKTOTAL, CKMB, CKMBINDEX, TROPONINI in the last 168 hours. BNP (last 3 results) No results for input(s): PROBNP in the last 8760 hours. HbA1C: No results for input(s): HGBA1C in the last 72 hours. CBG: Recent Labs  Lab 01/14/21 1804 01/14/21 2153 01/14/21 2325 01/15/21 0542 01/15/21 0839  GLUCAP 149* 138* 161* 98 94   Lipid Profile: No results for input(s):  CHOL, HDL, LDLCALC, TRIG, CHOLHDL, LDLDIRECT in the last 72 hours. Thyroid Function Tests: No results for input(s): TSH, T4TOTAL, FREET4, T3FREE, THYROIDAB in the last 72 hours. Anemia Panel: No results for input(s): VITAMINB12, FOLATE, FERRITIN, TIBC, IRON, RETICCTPCT in the last 72 hours. Sepsis Labs: No results for input(s): PROCALCITON, LATICACIDVEN in the last 168 hours.  No results found for this or any previous visit (from the past 240 hour(s)).     Imaging Studies   DG Swallowing Func-Speech Pathology  Result Date: 01/14/2021 Formatting of this result is different from the original. Objective Swallowing Evaluation: Type of Study: MBS-Modified Barium Swallow Study  Patient Details Name: Kosisochukwu Goldberg MRN: Eulis Manly Date of Birth: 1971/03/31 Today's Date: 01/14/2021 Time: SLP Start Time (ACUTE ONLY): 1339 -SLP Stop Time (ACUTE ONLY): 1358 SLP Time Calculation (min) (ACUTE ONLY): 19.9 min Past Medical History: Past Medical History: Diagnosis Date  Depression   DM (diabetes mellitus) (HCC)   GERD (gastroesophageal reflux disease)   HLD (hyperlipidemia)   Obesity   Tobacco abuse  Past Surgical History: Past Surgical History: Procedure Laterality Date  ABCESS DRAINAGE  11/2020  bartholin gland cyst  INCISION AND DRAINAGE ABSCESS Left 12/31/2020  Procedure: INCISION AND DRAINAGE ABSCESS BUTTOCK;  Surgeon: 03/02/2021, MD;  Location: [facility] OR;  Service: General;  Laterality: Left;  IRRIGATION AND DEBRIDEMENT BUTTOCKS Left 12/28/2020  Procedure:  DEBRIDEMENT LEFT BUTTOCK NECROTIZING SOFT TISSUE INFECTION - TOTAL TISSUE VOLUME 14CM X 12CM X 3CM DEEP;  Surgeon: Violeta Gelinas, MD;  Location: [facility] OR;  Service: General;  Laterality: Left;  WOUND DEBRIDEMENT Left 01/03/2021  Procedure: IRRIGATION AND DEBRIDEMENT LEFT BUTTOCK;  Surgeon: Axel Filler, MD;  Location: [facility] OR;  Service: General;  Laterality: Left; HPI: 50 y/o female initially admitted to [facility] on 5/29 complaining of fever and a buttock  abscess, moved to [facility] on 6/3 in the setting of worsening renal failure, acidosis, encephaloapthy with known group B strep wound culture. ETT 6/4-6/12, hx GERD, obesity.  Subjective: alert, upright in chair for procedure Assessment / Plan / Recommendation CHL IP CLINICAL IMPRESSIONS 01/14/2021 Clinical Impression Pt presents with mild pharyngeal dysphagia characterized by impairments in pharyngeal timing. She demonstrated occasional penetration (PAS 2) with larger boluses of thin and nectar thick liquids and this progressed twice to PAS 3 with consecutive swallows of thin liquids via straw. Pt was only able to swallow the 13mm barium tablet with regular texture solids; use of puree and thin liquids were unsucessful. Pt has demonstrated improvement in swallow function, and her diet will be advanced to regular texture solids and thin liquids. SLP will follow briefly to ensure tolerance of the advanced diet, but it is anticipated that further SLP services will not clinically indicated thereafter for swallowing. SLP Visit Diagnosis Dysphagia, pharyngeal phase (R13.13) Attention and concentration deficit following -- Frontal lobe and executive function deficit following -- Impact on safety and function Mild aspiration risk   CHL IP TREATMENT RECOMMENDATION 01/14/2021 Treatment Recommendations Therapy as outlined in treatment plan below   Prognosis 01/14/2021 Prognosis for Safe Diet Advancement Good Barriers to Reach Goals Time post onset Barriers/Prognosis Comment -- CHL IP DIET RECOMMENDATION 01/14/2021 SLP Diet Recommendations Regular solids;Thin liquid Liquid Administration via Cup;Straw Medication Administration Whole meds with puree Compensations Small sips/bites Postural Changes Seated upright at 90 degrees   CHL IP OTHER RECOMMENDATIONS 01/14/2021 Recommended Consults -- Oral Care Recommendations Oral care BID;Patient independent with oral care Other Recommendations --   CHL IP FOLLOW UP RECOMMENDATIONS 01/14/2021 Follow  up Recommendations None   CHL IP FREQUENCY AND DURATION 01/14/2021 Speech Therapy Frequency (ACUTE ONLY) min 1 x/week Treatment Duration 1 week      CHL IP ORAL PHASE 01/14/2021 Oral Phase WFL Oral - Pudding Teaspoon -- Oral - Pudding Cup -- Oral - Honey Teaspoon -- Oral - Honey Cup -- Oral - Nectar Teaspoon -- Oral - Nectar Cup -- Oral - Nectar Straw -- Oral - Thin Teaspoon -- Oral - Thin Cup -- Oral - Thin Straw -- Oral - Puree -- Oral - Mech Soft -- Oral - Regular -- Oral - Multi-Consistency -- Oral - Pill -- Oral Phase - Comment --  CHL IP PHARYNGEAL PHASE 01/14/2021 Pharyngeal Phase Impaired Pharyngeal- Pudding Teaspoon -- Pharyngeal -- Pharyngeal- Pudding Cup -- Pharyngeal -- Pharyngeal- Honey Teaspoon -- Pharyngeal -- Pharyngeal- Honey Cup NT Pharyngeal -- Pharyngeal- Nectar Teaspoon -- Pharyngeal -- Pharyngeal- Nectar Cup Delayed swallow initiation-vallecula;Delayed swallow initiation-pyriform sinuses Pharyngeal -- Pharyngeal- Nectar Straw -- Pharyngeal -- Pharyngeal- Thin Teaspoon -- Pharyngeal -- Pharyngeal- Thin Cup Delayed swallow initiation-pyriform sinuses Pharyngeal Material enters airway, remains ABOVE vocal cords then ejected out Pharyngeal- Thin Straw Delayed swallow initiation-pyriform sinuses;Penetration/Aspiration before swallow;Penetration/Aspiration during swallow Pharyngeal Material enters airway, remains ABOVE vocal cords then ejected out;Material enters airway, remains ABOVE vocal cords and not ejected out Pharyngeal- Puree Delayed swallow initiation-vallecula Pharyngeal -- Pharyngeal- Mechanical Soft -- Pharyngeal -- Pharyngeal-  Regular WFL Pharyngeal -- Pharyngeal- Multi-consistency -- Pharyngeal -- Pharyngeal- Pill WFL Pharyngeal -- Pharyngeal Comment --  CHL IP CERVICAL ESOPHAGEAL PHASE 01/14/2021 Cervical Esophageal Phase WFL Pudding Teaspoon -- Pudding Cup -- Honey Teaspoon -- Honey Cup -- Nectar Teaspoon -- Nectar Cup -- Nectar Straw -- Thin Teaspoon -- Thin Cup -- Thin Straw -- Puree --  Mechanical Soft -- Regular -- Multi-consistency -- Pill -- Cervical Esophageal Comment -- Shanika I. Vear Clock, MS, CCC-SLP Acute Rehabilitation Services Office number [phone] Pager [phone] Scheryl Marten 01/14/2021, 2:31 PM                Medications   Scheduled Meds:  amiodarone  400 mg Oral BID   apixaban  5 mg Oral BID   Chlorhexidine Gluconate Cloth  6 each Topical Q0600   clonazepam  1 mg Oral BID   docusate sodium  100 mg Oral BID   DULoxetine  60 mg Oral Daily   fentaNYL  1 patch Transdermal Q72H   insulin aspart  0-20 Units Subcutaneous TID WC   insulin aspart  3 Units Subcutaneous TID WC   insulin glargine  10 Units Subcutaneous BID   mouth rinse  15 mL Mouth Rinse BID   multivitamin with minerals  1 tablet Oral Daily   pantoprazole  40 mg Oral Daily   QUEtiapine  50 mg Oral QHS   sertraline  25 mg Oral Daily   sodium chloride flush  10-40 mL Intracatheter Q12H   Continuous Infusions:  sodium chloride 250 mL (01/07/21 1223)   feeding supplement (PIVOT 1.5 CAL) 1,000 mL (01/11/21 0555)   piperacillin-tazobactam (ZOSYN)  IV 3.375 g (01/15/21 0549)       LOS: 18 days    Time spent: 25 minutes with > 50% spent at bedside and in coordination of care.    Pennie Banter, DO [facility]  01/15/2021, 1:32 PM      If 7PM-7AM, please contact night-coverage. How to contact the [facility] Attending or Consulting provider 7A - 7P or covering provider during after hours 7P -7A, for this patient?    Check the care team in [facility] and look for a) attending/consulting TRH provider listed and b) the [facility] team listed Log into [email or web address] and use [place]'s universal password to access. If you do not have the password, please contact the hospital operator. Locate the [facility] provider you are looking for under [facility] and page to a number that you can be directly reached. If you still have difficulty reaching the provider, please page the [facility] (Director on  Call) for the Hospitalists listed on amion for assistance.

## 2021-01-15 NOTE — Care Management (Addendum)
Patient transferred to 6n     PT recommending LTAC. Spoke with Victorino Dike at [employer] does not have a LTAC benefit . Victorino Dike will review clinicals to see if patient is a LTAC candidate, if she is [facility] would need to get a one time contract with Medicaid   Spoke to Clayton at [facility] and she confirmed Medicaid does not have a LTAC benefit and they would not be able to offer a bed.

## 2021-01-15 NOTE — Progress Notes (Signed)
This Rn called Report to 6n Rn. Per pt., pt will update family about dispo to new location on 6n04. No signs of distress noted, all belongings packed and taken with pt. This Rn hand delivered pt cell phone and phone charger per pt. Wishes.

## 2021-01-15 NOTE — Plan of Care (Signed)
  Problem: Education: Goal: Knowledge of General Education information will improve Description: Including pain rating scale, medication(s)/side effects and non-pharmacologic comfort measures Outcome: Progressing  Blood pressure 108/74, pulse (!) 114, temperature 98.2 F (36.8 C), temperature source Oral, resp. rate 18, height 5\' 2"  (1.575 m), weight 93.4 kg, SpO2 98 %.  Alert and oriented X4 , forgetful at times , tachycardic upper teens MD aware. pt educated on safety, pt following commands and sometimes Impulsive.

## 2021-01-15 NOTE — Plan of Care (Signed)
?  Problem: Clinical Measurements: ?Goal: Ability to maintain clinical measurements within normal limits will improve ?Outcome: Progressing ?Goal: Will remain free from infection ?Outcome: Progressing ?Goal: Diagnostic test results will improve ?Outcome: Progressing ?  ?

## 2021-01-15 NOTE — Progress Notes (Signed)
Physical Therapy Wound Treatment Patient Details  Name: Evalynn Hankins MRN: [id number] Date of Birth: 01-Sep-1970  Today's Date: 01/15/2021 Time: 1115-1200 Time Calculation (min): 45 min  Subjective  Subjective Assessment Subjective: confused, decreased recall of info just told to her Patient and Family Stated Goals: None stated Date of Onset:  (Unsure) Prior Treatments: I&D 6/9  Pain Score:  Pt premedicated, complained of minimal pain with debridement.   Wound Assessment  Wound / Incision (Open or Dehisced) 12/28/20 Buttocks Left cyst- reddened, swollen area with brown drainage coming from left lower buttocks (Active)  Wound Image   01/15/21 1250  Dressing Type ABD;Barrier Film (skin prep);Gauze (Comment);Moist to moist;Normal saline moist dressing 01/15/21 1250  Dressing Changed Changed 01/15/21 1250  Dressing Status Clean;Dry;Intact 01/15/21 1250  Dressing Change Frequency Daily 01/15/21 1250  Site / Wound Assessment Red;Yellow 01/15/21 1250  % Wound base Red or Granulating 65% 01/15/21 1250  % Wound base Yellow/Fibrinous Exudate 35% 01/15/21 1250  % Wound base Black/Eschar 0% 01/15/21 1250  % Wound base Other/Granulation Tissue (Comment) 0% 01/15/21 1250  Peri-wound Assessment Maceration;Pink 01/15/21 1250  Wound Length (cm) 19 cm 01/15/21 1100  Wound Width (cm) 20 cm 01/15/21 1100  Wound Depth (cm) 4.5 cm 01/15/21 1100  Wound Volume (cm^3) 1710 cm^3 01/15/21 1100  Wound Surface Area (cm^2) 380 cm^2 01/15/21 1100  Tunneling (cm) 6 cm 6:00, 3.5 cm at 8:00 01/15/21 1100  Undermining (cm) up to 3 cm from 9:00 to 12:00 01/15/21 1250  Margins Unattached edges (unapproximated) 01/15/21 1250  Closure None 01/15/21 1250  Drainage Amount Minimal 01/15/21 1250  Drainage Description Serosanguineous 01/15/21 1250  Non-staged Wound Description Full thickness 01/15/21 1250  Treatment Debridement (Selective);Hydrotherapy (Pulse lavage);Packing (Saline gauze) 01/15/21 1250    Hydrotherapy Pulsed lavage therapy - wound location: L buttock Pulsed Lavage with Suction (psi): 8 psi Pulsed Lavage with Suction - Normal Saline Used: 1000 mL Pulsed Lavage Tip: Tip with splash shield Selective Debridement Selective Debridement - Location: L buttock Selective Debridement - Tools Used: Forceps, Scissors Selective Debridement - Tissue Removed: loose yellow slough    Wound Assessment and Plan  Wound Therapy - Assess/Plan/Recommendations Wound Therapy - Clinical Statement: Still progressing with debridement. Wound bed with increased granulation however undermining and tunnels still with a good amount of slough. Will continue with hydrotherapy for selective removal of unviable tissue, to decrease bioburden and promote wound bed healing. Wound Therapy - Functional Problem List: Decreased tolerance for OOB to chair and position changes due to pain and decreased skin integrity. Factors Delaying/Impairing Wound Healing: Infection - systemic/local, Incontinence Hydrotherapy Plan: Debridement, Dressing change, Patient/family education, Pulsatile lavage with suction Wound Therapy - Frequency: 6X / week Wound Therapy - Follow Up Recommendations: dressing changes by RN  Wound Therapy Goals- Improve the function of patient's integumentary system by progressing the wound(s) through the phases of wound healing (inflammation - proliferation - remodeling) by: Wound Therapy Goals - Improve the function of patient's integumentary system by progressing the wound(s) through the phases of wound healing by: Decrease Necrotic Tissue to: 0% Decrease Necrotic Tissue - Progress: Progressing toward goal Increase Granulation Tissue to: 100% Increase Granulation Tissue - Progress: Progressing toward goal Goals/treatment plan/discharge plan were made with and agreed upon by patient/family: Yes Time For Goal Achievement: 7 days Wound Therapy - Potential for Goals: Good  Goals will be updated until  maximal potential achieved or discharge criteria met.  Discharge criteria: when goals achieved, discharge from hospital, MD decision/surgical intervention, no progress towards goals, refusal/missing  three consecutive treatments without notification or medical reason.  GP     Charges PT Wound Care Charges $Wound Debridement up to 20 cm: < or equal to 20 cm $ Wound Debridement each add'l 20 sqcm: 8 $PT PLS Gun and Tip: 1 Supply $PT Hydrotherapy Visit: 1 Visit       Thelma Comp 01/15/2021, 1:05 PM  Rolinda Roan, PT, DPT Acute Rehabilitation Services Pager: [phone] Office: [phone]

## 2021-01-16 LAB — BASIC METABOLIC PANEL
Anion gap: 8 (ref 5–15)
BUN: 13 mg/dL (ref 6–20)
CO2: 24 mmol/L (ref 22–32)
Calcium: 8.3 mg/dL — ABNORMAL LOW (ref 8.9–10.3)
Chloride: 104 mmol/L (ref 98–111)
Creatinine, Ser: 1.05 mg/dL — ABNORMAL HIGH (ref 0.44–1.00)
GFR, Estimated: >60 mL/min (ref >60)
Glucose, Bld: 126 mg/dL — ABNORMAL HIGH (ref 70–99)
Potassium: 3.4 mmol/L — ABNORMAL LOW (ref 3.5–5.1)
Sodium: 136 mmol/L (ref 135–145)

## 2021-01-16 LAB — CBC
HCT: 23.7 % — ABNORMAL LOW (ref 36.0–46.0)
Hemoglobin: 7.5 g/dL — ABNORMAL LOW (ref 12.0–15.0)
MCH: 31.6 pg (ref 26.0–34.0)
MCHC: 31.6 g/dL (ref 30.0–36.0)
MCV: 100 fL (ref 80.0–100.0)
Platelets: 275 10*3/uL (ref 150–400)
RBC: 2.37 MIL/uL — ABNORMAL LOW (ref 3.87–5.11)
RDW: 15.9 % — ABNORMAL HIGH (ref 11.5–15.5)
WBC: 5.9 10*3/uL (ref 4.0–10.5)
nRBC: 0 % (ref 0.0–0.2)

## 2021-01-16 LAB — GLUCOSE, CAPILLARY
Glucose-Capillary: 110 mg/dL — ABNORMAL HIGH (ref 70–99)
Glucose-Capillary: 111 mg/dL — ABNORMAL HIGH (ref 70–99)
Glucose-Capillary: 118 mg/dL — ABNORMAL HIGH (ref 70–99)
Glucose-Capillary: 124 mg/dL — ABNORMAL HIGH (ref 70–99)
Glucose-Capillary: 160 mg/dL — ABNORMAL HIGH (ref 70–99)
Glucose-Capillary: 179 mg/dL — ABNORMAL HIGH (ref 70–99)
Glucose-Capillary: 75 mg/dL (ref 70–99)
Glucose-Capillary: 78 mg/dL (ref 70–99)
Glucose-Capillary: 86 mg/dL (ref 70–99)

## 2021-01-16 LAB — RETICULOCYTES
Immature Retic Fract: 13.2 % (ref 2.3–15.9)
RBC.: 2.49 MIL/uL — ABNORMAL LOW (ref 3.87–5.11)
Retic Count, Absolute: 86.2 10*3/uL (ref 19.0–186.0)
Retic Ct Pct: 3.5 % — ABNORMAL HIGH (ref 0.4–3.1)

## 2021-01-16 LAB — VITAMIN B12: Vitamin B-12: 1189 pg/mL — ABNORMAL HIGH (ref 180–914)

## 2021-01-16 LAB — IRON AND TIBC
Iron: 29 ug/dL (ref 28–170)
Saturation Ratios: 12 % (ref 10.4–31.8)
TIBC: 237 ug/dL — ABNORMAL LOW (ref 250–450)
UIBC: 208 ug/dL

## 2021-01-16 LAB — FOLATE: Folate: 15.8 ng/mL (ref >5.9)

## 2021-01-16 LAB — MAGNESIUM: Magnesium: 1.8 mg/dL (ref 1.7–2.4)

## 2021-01-16 LAB — FERRITIN: Ferritin: 261 ng/mL (ref 11–307)

## 2021-01-16 MED ORDER — FENTANYL CITRATE (PF) 100 MCG/2ML IJ SOLN: 12.5000 ug | INTRAMUSCULAR | Status: DC | PRN

## 2021-01-16 MED ORDER — PIPERACILLIN-TAZOBACTAM 3.375 G IVPB
3.3750 g | Freq: Three times a day (TID) | INTRAVENOUS | Status: AC
  Administered 2021-01-16 – 2021-01-17 (×4): 3.375 g via INTRAVENOUS
  Filled 2021-01-16 (×3): qty 50

## 2021-01-16 MED ORDER — PROSOURCE PLUS PO LIQD
30.0000 mL | Freq: Two times a day (BID) | ORAL | Status: DC
  Administered 2021-01-16 – 2021-01-18 (×4): 30 mL via ORAL
  Filled 2021-01-16 (×4): qty 30

## 2021-01-16 MED ORDER — MAGNESIUM OXIDE -MG SUPPLEMENT 400 (240 MG) MG PO TABS
400.0000 mg | ORAL_TABLET | Freq: Two times a day (BID) | ORAL | Status: DC
  Administered 2021-01-16 – 2021-01-19 (×7): 400 mg via ORAL
  Filled 2021-01-16 (×7): qty 1

## 2021-01-16 NOTE — Progress Notes (Signed)
Physical Therapy Wound Treatment Patient Details  Name: [name] [name] MRN: [id number] Date of Birth: 1971-04-24  Today's Date: 01/16/2021 Time: 1200-1232 Time Calculation (min): 32 min  Subjective  Subjective Assessment Subjective: lethargic/sleepy this session. Patient and Family Stated Goals: None stated Date of Onset:  (Unsure) Prior Treatments: I&D 6/9  Pain Score:  Pt was premedicated and didn't complain of pain throughout session.  Wound Assessment  Wound / Incision (Open or Dehisced) 12/28/20 Buttocks Left cyst- reddened, swollen area with brown drainage coming from left lower buttocks (Active)  Dressing Type ABD;Barrier Film (skin prep);Gauze (Comment);Moist to moist;Normal saline moist dressing 01/16/21 1202  Dressing Changed Changed 01/16/21 1202  Dressing Status Clean;Dry;Intact 01/16/21 1202  Dressing Change Frequency Daily 01/16/21 1202  Site / Wound Assessment Red;Yellow 01/16/21 1202  % Wound base Red or Granulating 70% 01/16/21 1202  % Wound base Yellow/Fibrinous Exudate 30% 01/16/21 1202  % Wound base Black/Eschar 0% 01/16/21 1202  % Wound base Other/Granulation Tissue (Comment) 0% 01/16/21 1202  Peri-wound Assessment Maceration;Pink 01/16/21 1202  Wound Length (cm) 19 cm 01/15/21 1100  Wound Width (cm) 20 cm 01/15/21 1100  Wound Depth (cm) 4.5 cm 01/15/21 1100  Wound Volume (cm^3) 1710 cm^3 01/15/21 1100  Wound Surface Area (cm^2) 380 cm^2 01/15/21 1100  Tunneling (cm) 6 cm 6:00, 3.5 cm at 8:00 01/15/21 1100  Undermining (cm) up to 3 cm from 9:00 to 12:00 01/15/21 1250  Margins Unattached edges (unapproximated) 01/16/21 1202  Closure None 01/16/21 1202  Drainage Amount Minimal 01/16/21 1202  Drainage Description Serosanguineous 01/16/21 1202  Non-staged Wound Description Full thickness 01/16/21 1202  Treatment Debridement (Selective);Hydrotherapy (Pulse lavage);Packing (Saline gauze) 01/16/21 1202   Hydrotherapy Pulsed lavage therapy - wound location: L  buttock Pulsed Lavage with Suction (psi): 8 psi Pulsed Lavage with Suction - Normal Saline Used: 1000 mL Pulsed Lavage Tip: Tip with splash shield Selective Debridement Selective Debridement - Location: L buttock Selective Debridement - Tools Used: Forceps, Scissors Selective Debridement - Tissue Removed: loose yellow slough    Wound Assessment and Plan  Wound Therapy - Assess/Plan/Recommendations Wound Therapy - Clinical Statement: Still progressing with debridement. Wound bed with increased granulation however undermining and tunnels still with a good amount of slough. Will continue with hydrotherapy for selective removal of unviable tissue, to decrease bioburden and promote wound bed healing. Wound Therapy - Functional Problem List: Decreased tolerance for OOB to chair and position changes due to pain and decreased skin integrity. Factors Delaying/Impairing Wound Healing: Infection - systemic/local, Incontinence Hydrotherapy Plan: Debridement, Dressing change, Patient/family education, Pulsatile lavage with suction Wound Therapy - Frequency: 6X / week Wound Therapy - Follow Up Recommendations: dressing changes by RN  Wound Therapy Goals- Improve the function of patient's integumentary system by progressing the wound(s) through the phases of wound healing (inflammation - proliferation - remodeling) by: Wound Therapy Goals - Improve the function of patient's integumentary system by progressing the wound(s) through the phases of wound healing by: Decrease Necrotic Tissue to: 0% Decrease Necrotic Tissue - Progress: Progressing toward goal Increase Granulation Tissue to: 100% Increase Granulation Tissue - Progress: Progressing toward goal Goals/treatment plan/discharge plan were made with and agreed upon by patient/family: Yes Time For Goal Achievement: 7 days Wound Therapy - Potential for Goals: Good  Goals will be updated until maximal potential achieved or discharge criteria met.   Discharge criteria: when goals achieved, discharge from hospital, MD decision/surgical intervention, no progress towards goals, refusal/missing three consecutive treatments without notification or medical reason.  GP  Charges PT Wound Care Charges $Wound Debridement up to 20 cm: < or equal to 20 cm $ Wound Debridement each add'l 20 sqcm: 5 $PT PLS Gun and Tip: 1 Supply $PT Hydrotherapy Visit: 1 Visit       Thelma Comp 01/16/2021, 1:40 PM  Rolinda Roan, PT, DPT Acute Rehabilitation Services Pager: [phone] Office: [phone]

## 2021-01-16 NOTE — Care Management (Signed)
[name] [name] with [facility] has declined patient for LTAC. Patient was also declined by [facility]

## 2021-01-16 NOTE — Progress Notes (Signed)
Physical Therapy Treatment Patient Details Name: [name] [name] MRN: [id number] DOB: 08/19/1970 Today's Date: 01/16/2021    History of Present Illness 50 y.o. female presenting from [facility] on 6/3 with encephalopathy, polymicrobial necrotizing skin and soft tissue infection of the left buttock/posterior thigh with TSS s/p debridement 6/3 and 6/6, septic shock, heart failure possibly A-fib with RVR induced and AKI. CCRT 6/3-6/14; extubated 6/12; fall out of bed 6/15 with (-) head CT. PMHx significant for DMII, anxiety and new onset A-fib.    PT Comments    Pt supine in bed.  Pt eager to participate.  Able to tolerate increased activity.  Continues to benefit from [facility] given her medical complexity.  Balance remains poor and noted LOB x 1 this session.     Follow Up Recommendations  LTACH     Equipment Recommendations  Other (comment)    Recommendations for Other Services       Precautions / Restrictions Precautions Precautions: Fall Precaution Comments: High fall risk, huge left buttock wound Restrictions Weight Bearing Restrictions: No    Mobility  Bed Mobility Overal bed mobility: Needs Assistance Bed Mobility: Supine to Sit;Sit to Supine     Supine to sit: Supervision Sit to supine: Min assist   General bed mobility comments: Min assistance to return LEs back to bed.    Transfers Overall transfer level: Needs assistance Equipment used: Rolling walker (2 wheeled) Transfers: Sit to/from Stand Sit to Stand: Min assist         General transfer comment: Min assistance.  Posterior LOB noted x 1 during a standing trial.  Ambulation/Gait Ambulation/Gait assistance: Min assist Gait Distance (Feet): 50 Feet (x4) Assistive device: Rolling walker (2 wheeled) Gait Pattern/deviations: Step-through pattern;Decreased stride length;Drifts right/left;Trunk flexed     General Gait Details: Pt with poor obstacle negotiation and balance.  required x 4 standing rest  periods due to fatigue.  Frequent cues to step closer to RW for balance and safety.   Stairs             Wheelchair Mobility    Modified Rankin (Stroke Patients Only)       Balance                                            Cognition Arousal/Alertness: Awake/alert Behavior During Therapy: Impulsive Overall Cognitive Status: Impaired/Different from baseline Area of Impairment: Following commands;Safety/judgement;Awareness;Problem solving                       Following Commands: Follows one step commands consistently;Follows multi-step commands with increased time Safety/Judgement: Decreased awareness of safety   Problem Solving: Slow processing General Comments: Patient impulsive. Mod to max cues for safety. Decr safety as she fatigues      Exercises General Exercises - Lower Extremity Long Arc Quad: AROM;Both;10 reps;Seated Hip Flexion/Marching: AROM;Both;5 reps;Standing Other Exercises Other Exercises: B shoulder flexion and extension x 10 reps.  Seated.    General Comments        Pertinent Vitals/Pain Pain Assessment: Faces Pain Score: 4  Pain Location: left buttock Pain Descriptors / Indicators: Aching;Sore;Throbbing Pain Intervention(s): Monitored during session;Repositioned    Home Living                      Prior Function            PT Goals (  current goals can now be found in the care plan section) Acute Rehab PT Goals Patient Stated Goal: To get better. Potential to Achieve Goals: Fair Progress towards PT goals: Progressing toward goals    Frequency    Min 3X/week      PT Plan Current plan remains appropriate    Co-evaluation              AM-PAC PT "6 Clicks" Mobility   Outcome Measure  Help needed turning from your back to your side while in a flat bed without using bedrails?: A Little Help needed moving from lying on your back to sitting on the side of a flat bed without using  bedrails?: A Little Help needed moving to and from a bed to a chair (including a wheelchair)?: A Little Help needed standing up from a chair using your arms (e.g., wheelchair or bedside chair)?: A Little Help needed to walk in hospital room?: A Little Help needed climbing 3-5 steps with a railing? : A Lot 6 Click Score: 17    End of Session Equipment Utilized During Treatment: Gait belt Activity Tolerance: Patient tolerated treatment well;Patient limited by fatigue Patient left: in bed;with call bell/phone within reach;with bed alarm set Nurse Communication: Mobility status PT Visit Diagnosis: Unsteadiness on feet (R26.81);Muscle weakness (generalized) (M62.81)     Time: 1710-1736 PT Time Calculation (min) (ACUTE ONLY): 26 min  Charges:  $Gait Training: 8-22 mins $Therapeutic Exercise: 8-22 mins                     Bonney Leitz , PTA Acute Rehabilitation Services Pager [phone] Office [phone]    [name] [name] 01/16/2021, 5:42 PM

## 2021-01-16 NOTE — Progress Notes (Signed)
PROGRESS NOTE   [name] [name]  OAC:[id number] DOB: 05/05/1971 DOA: 12/28/2020 PCP: Pcp, No  Brief Narrative:  47 white female HTN DM TY 2 reflux Recent Bartholin cyst surgery@[facility]  [place] 5/29-culture grew GBS  CT scan at outside hospital--showed subcutaneous gas no drainable abscess antibiotics were adjusted patient was transferred to [facility] 6/3  eb route was hypotensive with azotemia and significant leukocytosis with metabolic encephalopathy and admitted to the ICU-placed on pressors and IV fluids Found to have gluteal fold abscess and erythema and induration and necrotizing soft tissue infection of left buttock   Hospital events as below  5/29 admitted to [place] sepsis felt 2/2 cellulitis on buttocks. Cultures sent. CT abd/pelvis + Salix gas but no drainable abscess. Started on Vanc. IVFs. Wound also cultured. 6/1 wound culture back + group B strep. 1 of 4 blood cultures + GPC. ABX changed to rocephin 6/3 Arrived at [facility] from [place] w/ new hypotension, worsening renal failure, worsening leukocytosis, poorly controlled pain and new encephalopathy. Admitted to ICU. Started on zyvox and meropenem. Surgical consult requested> to OR for debridement 6/3 started CRRT, meropenem and Linezolid added 6/4 IVIg >6/6 6/5 Meropenem stopped 6/6 zosyn added 6/6 back to OR for debridement, in and out of atrial flutter; QTc 590; cardiology consult> amiodarone 6/7 off vasopressors, continues to have atrial flutter, started ketamine, Linezolid stopped 6/8 started on metoprolol, echo with LVEF 45-50%. 6/9 OR debridement 6/11 Fever curve improved, remains on CRRT 6/12 extubated 6/13 remains on CRRT 6/14 CRRT stopped, continues to have pain and agitation issues 6/15 agitated, fell out of bed, reported that she hit her head, head CT neg 6/16 much better with psych med changes 6/17 transferred to [facility] 6/22 continues to improve  Hospital-Problem based course  Septic shock on admission  2/2 polymicrobial necrotizing buttocks infection Antibiotic (Zosyn) stop date 6/23 per Dr. Earlene Plater ID who was signed off on 6/17 Continue hydrotherapy Cut back fentanyl from 50-12.5 every 2 IV and attempt to use p.o. pain management Eventually discontinue IV pain meds next day day and a half Needs significant wound care at [facility] level-still awake but Atrial flutter with RVR, CHADS2 score >3 HFrEF secondary to a flutter EF 45-50% Currently on amiodarone 400 twice daily for rate control as cannot tolerate beta-blocker Continues on Eliquis 5 twice daily Needs ischemic evaluation/attempt at DCCV per Dr. Devin Going last note Will non-emergently discuss with them plan for the same Resolving ICU delirium Discontinue safety sitter Continue Klonopin 1 mg twice daily as needed oxycodone as needed Ativan if needed Underlying depression anxiety Continue Cymbalta DR 60 daily, Seroquel at bedtime, sertraline 25 and will cut back on the same going forward DM TY 2 on oral meds prior to admission Glycemic control reasonable on sliding scale, 3 units 3 times daily and 10 units twice daily of Lantus Adjust as needed Prior dysphagia Tolerating regular thin liquids since 6/20 previously had core track   DVT prophylaxis: Eliquis Code Status: Full Family Communication: None at bedside Disposition:  Status is: Inpatient  Remains inpatient appropriate because:Hemodynamically unstable, Ongoing diagnostic testing needed not appropriate for outpatient work up, and Unsafe d/c plan  Dispo: The patient is from: Home              Anticipated d/c is to:  Unclear at this time-has been denied LTAC placement              Patient currently is not medically stable to d/c.   Difficult to place patient No  Consultants:  Multiple  Procedures: Multiple debridements  Antimicrobials:  Currently Zosyn    Subjective: Awake coherent not agitated pleasant no distress Able to move around in the bed but tells  me has not been up and out of bed Eager to work with therapy No chest pain no fever Tells me he has some numbness in her lower extremities which is slowly improving Is able to move all 4 extremities without gross deficit   Objective: Vitals:   01/16/21 0007 01/16/21 0352 01/16/21 0819 01/16/21 1340  BP: 107/72 105/72 96/72 106/71  Pulse: (!) 112 (!) 110 (!) 114 (!) 107  Resp: 19 18 18 18   Temp: 98.3 F (36.8 C) 97.7 F (36.5 C) 97.8 F (36.6 C) 97.8 F (36.6 C)  TempSrc: Oral Oral Oral Oral  SpO2: 96% 96% 99% 95%  Weight:      Height:        Intake/Output Summary (Last 24 hours) at 01/16/2021 1758 Last data filed at 01/16/2021 1756 Gross per 24 hour  Intake 1060 ml  Output 600 ml  Net 460 ml   Filed Weights   01/07/21 0459 01/08/21 0400 01/08/21 0800  Weight: 99.2 kg 93.4 kg 93.4 kg    Examination:  Coherent oriented white female no distress Multiple tattoos S1-S2 holosystolic murmur Abdomen soft no rebound no guarding Wound not examined today Power 5/5 No lower extremity edema   Data Reviewed: personally reviewed   CBC    Component Value Date/Time   WBC 5.9 01/16/2021 0023   RBC 2.49 (L) 01/16/2021 0735   RBC 2.37 (L) 01/16/2021 0023   HGB 7.5 (L) 01/16/2021 0023   HCT 23.7 (L) 01/16/2021 0023   PLT 275 01/16/2021 0023   MCV 100.0 01/16/2021 0023   MCH 31.6 01/16/2021 0023   MCHC 31.6 01/16/2021 0023   RDW 15.9 (H) 01/16/2021 0023   LYMPHSABS 2.2 01/03/2021 0638   MONOABS 1.3 (H) 01/03/2021 0638   EOSABS 0.1 01/03/2021 0638   BASOSABS 0.1 01/03/2021 0638   CMP Latest Ref Rng & Units 01/16/2021 01/15/2021 01/14/2021  Glucose 70 - 99 mg/dL 01/16/2021) 893(Y) 101(B)  BUN 6 - 20 mg/dL 13 17 19   Creatinine 0.44 - 1.00 mg/dL 510(C) ) 5.85(I)  Sodium 135 - 145 mmol/L 136 138 138  Potassium 3.5 - 5.1 mmol/L 3.4(L) 4.0 3.9  Chloride 98 - 111 mmol/L 104 105 105  CO2 22 - 32 mmol/L 24 24 25   Calcium 8.9 - 10.3 mg/dL 8.3(L) 8.5(L) 8.5(L)  Total Protein  6.5 - 8.1 g/dL - - -  Total Bilirubin 0.3 - 1.2 mg/dL - - -  Alkaline Phos 38 - 126 U/L - - -  AST 15 - 41 U/L - - -  ALT 0 - 44 U/L - - -     Radiology Studies: No results found.   Scheduled Meds:  (feeding supplement) PROSource Plus  30 mL Oral BID WC   amiodarone  400 mg Oral BID   apixaban  5 mg Oral BID   Chlorhexidine Gluconate Cloth  6 each Topical Q0600   clonazepam  1 mg Oral BID   DULoxetine  60 mg Oral Daily   feeding supplement  237 mL Oral TID BM   fentaNYL  1 patch Transdermal Q72H   insulin aspart  0-20 Units Subcutaneous TID WC   insulin aspart  3 Units Subcutaneous TID WC   insulin glargine  10 Units Subcutaneous BID   magnesium oxide  400 mg Oral BID  mouth rinse  15 mL Mouth Rinse BID   multivitamin with minerals  1 tablet Oral Daily   pantoprazole  40 mg Oral Daily   QUEtiapine  50 mg Oral QHS   sertraline  25 mg Oral Daily   sodium chloride flush  10-40 mL Intracatheter Q12H   Continuous Infusions:  sodium chloride 250 mL (01/07/21 1223)     LOS: 19 days   Time spent: 47 minutes  Rhetta Mura, MD [facility] To contact the attending provider between 7A-7P or the covering provider during after hours 7P-7A, please log into the web site [email or web address] and access using universal [place] password for that web site. If you do not have the password, please call the hospital operator.  01/16/2021, 5:58 PM

## 2021-01-16 NOTE — Progress Notes (Signed)
Nutrition Follow-up  DOCUMENTATION CODES:   Not applicable  INTERVENTION:   Calorie Count in progress; if po intake inadequate, recommend re-initiation of nutrition support, TF vs TPN.. Pt with very high needs secondary to [employer] with large wound  Continue liberalized diet with no restrictions  Ensure Enlive po TID, each supplement provides 350 kcal and 20 grams of protein  30 ml ProSource Plus BID, each supplement provides 100 kcals and 15 grams protein.   Magic cup with lunch/dinner meals, each supplement provides 290 kcal and 9 grams of protein   MVI with Minerals   NUTRITION DIAGNOSIS:   Increased nutrient needs related to wound healing, acute illness (L buttock necrotizing fasciitis) as evidenced by estimated needs.  Being addressed via supplements, calorie count  GOAL:   Patient will meet greater than or equal to 90% of their needs  Not Met  MONITOR:   Vent status, TF tolerance, Labs, Skin  REASON FOR ASSESSMENT:   Ventilator, Consult Enteral/tube feeding initiation and management  ASSESSMENT:   50 yo female admitted from [place] with worsening renal function, necrotizing soft tissue infection of left buttock. PMH includes Bartholin cyst, HTN, DM-2, GERD.  Pt continues with hydrotherapy  Noted pt pulled out Cortrak on 6/18 and has refused any NG/Cortrak replacement  Diet upgraded on 6/20 to Regular with Thin Liquids from Dysphagia 3, Nectar Thick. Recorded po intake yesterday (6/21) only 25%. Recorded po intake 0-50%, <25% on average  Weight down to 93 kg  Labs: potassium 3.4, CBGs 82-179 Meds: MVI with Minerals, mag ox, ss novolog, lantus, novolog with meals   Diet Order:   Diet Order             Diet regular Room service appropriate? Yes with Assist; Fluid consistency: Thin  Diet effective now                   EDUCATION NEEDS:   Not appropriate for education at this time  Skin:  Skin Assessment: Skin Integrity Issues: Skin  Integrity Issues:: Other (Comment) Other: L buttock soft tissue infection, S/P debridement  Last BM:  6/22 rectal tube  Height:   Ht Readings from Last 1 Encounters:  01/08/21 _0  (1.575 m)    Weight:   Wt Readings from Last 1 Encounters:  01/08/21 93.4 kg    Ideal Body Weight:  50 kg  BMI:  Body mass index is 37.66 kg/m.  Estimated Nutritional Needs:   Kcal:  2200-2500  Protein:  130-150 gm  Fluid:  >/= 2 L   Kerman Passey MS, RDN, LDN, CNSC Registered Dietitian III Clinical Nutrition RD Pager and On-Call Pager Number Located in [place]

## 2021-01-17 LAB — GLUCOSE, CAPILLARY
Glucose-Capillary: 120 mg/dL — ABNORMAL HIGH (ref 70–99)
Glucose-Capillary: 133 mg/dL — ABNORMAL HIGH (ref 70–99)
Glucose-Capillary: 59 mg/dL — ABNORMAL LOW (ref 70–99)
Glucose-Capillary: 81 mg/dL (ref 70–99)
Glucose-Capillary: 81 mg/dL (ref 70–99)
Glucose-Capillary: 81 mg/dL (ref 70–99)
Glucose-Capillary: 82 mg/dL (ref 70–99)
Glucose-Capillary: 90 mg/dL (ref 70–99)

## 2021-01-17 LAB — COMPREHENSIVE METABOLIC PANEL
ALT: 16 U/L (ref 0–44)
AST: 34 U/L (ref 15–41)
Albumin: 2.2 g/dL — ABNORMAL LOW (ref 3.5–5.0)
Alkaline Phosphatase: 154 U/L — ABNORMAL HIGH (ref 38–126)
Anion gap: 10 (ref 5–15)
BUN: 13 mg/dL (ref 6–20)
CO2: 21 mmol/L — ABNORMAL LOW (ref 22–32)
Calcium: 8.6 mg/dL — ABNORMAL LOW (ref 8.9–10.3)
Chloride: 107 mmol/L (ref 98–111)
Creatinine, Ser: 1.11 mg/dL — ABNORMAL HIGH (ref 0.44–1.00)
GFR, Estimated: >60 mL/min (ref >60)
Glucose, Bld: 86 mg/dL (ref 70–99)
Potassium: 5 mmol/L (ref 3.5–5.1)
Sodium: 138 mmol/L (ref 135–145)
Total Bilirubin: 0.7 mg/dL (ref 0.3–1.2)
Total Protein: 7.2 g/dL (ref 6.5–8.1)

## 2021-01-17 LAB — PROTIME-INR
INR: 1.6 — ABNORMAL HIGH (ref 0.8–1.2)
Prothrombin Time: 18.6 seconds — ABNORMAL HIGH (ref 11.4–15.2)

## 2021-01-17 MED ORDER — INSULIN GLARGINE 100 UNIT/ML ~~LOC~~ SOLN
5.0000 [IU] | Freq: Two times a day (BID) | SUBCUTANEOUS | Status: DC
  Administered 2021-01-17 – 2021-01-19 (×4): 5 [IU] via SUBCUTANEOUS
  Filled 2021-01-17 (×6): qty 0.05

## 2021-01-17 MED ORDER — AMIODARONE HCL 200 MG PO TABS
200.0000 mg | ORAL_TABLET | Freq: Two times a day (BID) | ORAL | Status: DC
  Administered 2021-01-17 – 2021-01-19 (×5): 200 mg via ORAL
  Filled 2021-01-17 (×4): qty 1

## 2021-01-17 MED ORDER — QUETIAPINE FUMARATE 50 MG PO TABS
25.0000 mg | ORAL_TABLET | Freq: Every day | ORAL | Status: DC
  Administered 2021-01-17 – 2021-01-18 (×2): 25 mg via ORAL
  Filled 2021-01-17 (×2): qty 1

## 2021-01-17 MED ORDER — AMIODARONE HCL 200 MG PO TABS: 200.0000 mg | ORAL_TABLET | Freq: Every day | ORAL | Status: DC

## 2021-01-17 MED ORDER — LORAZEPAM 2 MG/ML IJ SOLN: 0.5000 mg | INTRAMUSCULAR | Status: DC | PRN

## 2021-01-17 NOTE — Progress Notes (Signed)
Hypoglycemic Event  CBG: 59  Treatment: 4 oz juice/soda  Symptoms: Pale  Follow-up CBG: Time: 0802 CBG Result: 81  Possible Reasons for Event: Inadequate Meal Intake    Nickola Major

## 2021-01-17 NOTE — Progress Notes (Signed)
PROGRESS NOTE   [name] [name]  HER:[id number] DOB: 08/31/1970 DOA: 12/28/2020 PCP: Pcp, No  Brief Narrative:  11 white female HTN DM TY 2 reflux Recent Bartholin cyst surgery@[facility]  [place] 5/29-culture grew GBS  CT scan at outside hospital--showed SQ gas buttocks no drainable abscess -antibiotics adjusted- patient --> Misquamicut 6/3  en route - hypotensive with azotemia and significant leukocytosis with metabolic encephalopathy and admitted to the ICU-placed on pressors and IV fluids Found to have gluteal fold abscess and erythema and induration and necrotizing soft tissue infection of left buttock   Hospital events as below  5/29 admitted to [facility] sepsis felt 2/2 cellulitis on buttocks. Cultures sent. CT abd/pelvis + [place] gas but no drainable abscess. Started on Vanc. IVFs. Wound also cultured. 6/1 wound culture back + group B strep. 1 of 4 blood cultures + GPC. ABX changed to rocephin 6/3 Arrived at [facility] from [place] w/ new hypotension, worsening renal failure, worsening leukocytosis, poorly controlled pain and new encephalopathy. Admitted to ICU. Started on zyvox and meropenem. Surgical consult requested> to OR for debridement 6/3 started CRRT, meropenem and Linezolid added 6/4 IVIg >6/6 6/5 Meropenem stopped 6/6 zosyn added 6/6 back to OR for debridement, in and out of atrial flutter; QTc 590; cardiology consult> amiodarone 6/7 off vasopressors, continues to have atrial flutter, started ketamine, Linezolid stopped 6/8 started on metoprolol, echo with LVEF 45-50%. 6/9 OR debridement 6/11 Fever curve improved, remains on CRRT 6/12 extubated 6/13 remains on CRRT 6/14 CRRT stopped, continues to have pain and agitation issues 6/15 agitated, fell out of bed, reported that she hit her head, head CT neg 6/16 much better with psych med changes 6/17 transferred to [facility] 6/22 continues to improve DCCV planned for 6/24  Hospital-Problem based course  Septic shock on admission  2/2 polymicrobial necrotizing buttocks infection Antibiotic (Zosyn) stopped 6/23 (6/3-6/23) per Dr. Earlene Plater ID  Continue hydrotherapy Stop fentanyl patch and fentanyl IV and control pain with oxycodone-can augment as needed Potentially will get home health care now if can be coordinated in the outpatient Atrial flutter with RVR, CHADS2 score >3 HFrEF secondary to a flutter EF 45-50% Currently on amiodarone 400 twice daily for rate control as cannot tolerate beta-blocker Continues on Eliquis 5 twice daily Plan for DCCV a.m. and outpatient ischemic eval per Dr. Maurice March input Resolving ICU delirium Discontinue safety sitter Continue Klonopin 1 mg twice daily as needed oxycodone as needed Ativan if needed Underlying depression anxiety Continue Cymbalta DR 60 daily, Seroquel cut back to 25 at bedtime at bedtime, sertraline 25  Cut back Ativan 2.5 every 4 as needed and discontinue in the next several days DM TY 2 on oral meds prior to admission with complication of hyperglycemia this admission CBG 59-81, Cut back Lantus to 5 units tonight and sliding scale coverage Adjust as needed Prior dysphagia Tolerating regular thin liquids since 6/20 previously had core track   DVT prophylaxis: Eliquis Code Status: Full Family Communication: None at bedside Disposition:  Status is: Inpatient  Remains inpatient appropriate because:Hemodynamically unstable, Ongoing diagnostic testing needed not appropriate for outpatient work up, and Unsafe d/c plan  Dispo: The patient is from: Home              Anticipated d/c is to:  Possibly home with home health and 24 hours              Patient currently is not medically stable to d/c.   Difficult to place patient No  Consultants:  Multiple  Procedures: Multiple debridements  Antimicrobials:     Subjective:  Coherent but occasionally confused No chest pain no fever Has been out of bed some Tells me sister will be able to help  manage her in the outpatient setting   Objective: Vitals:   01/16/21 1340 01/16/21 2006 01/16/21 2353 01/17/21 0606  BP: 106/71 104/61 94/64 98/68   Pulse: (!) 107 (!) 104 90 98  Resp: 18 19 18 18   Temp: 97.8 F (36.6 C) 98 F (36.7 C) 98.2 F (36.8 C) 98.1 F (36.7 C)  TempSrc: Oral  Oral Oral  SpO2: 95% 99% 97% 98%  Weight:      Height:        Intake/Output Summary (Last 24 hours) at 01/17/2021 0753 Last data filed at 01/17/2021 0610 Gross per 24 hour  Intake 1050 ml  Output 1350 ml  Net -300 ml    Filed Weights   01/07/21 0459 01/08/21 0400 01/08/21 0800  Weight: 99.2 kg 93.4 kg 93.4 kg    Examination:  Slightly confused Multiple tattoos S1-S2 holosystolic murmur Abdomen soft no rebound no guarding Wound not examined Power 5/5 No lower extremity edema   Data Reviewed: personally reviewed   CBC    Component Value Date/Time   WBC 5.9 01/16/2021 0023   RBC 2.49 (L) 01/16/2021 0735   RBC 2.37 (L) 01/16/2021 0023   HGB 7.5 (L) 01/16/2021 0023   HCT 23.7 (L) 01/16/2021 0023   PLT 275 01/16/2021 0023   MCV 100.0 01/16/2021 0023   MCH 31.6 01/16/2021 0023   MCHC 31.6 01/16/2021 0023   RDW 15.9 (H) 01/16/2021 0023   LYMPHSABS 2.2 01/03/2021 0638   MONOABS 1.3 (H) 01/03/2021 0638   EOSABS 0.1 01/03/2021 0638   BASOSABS 0.1 01/03/2021 0638   CMP Latest Ref Rng & Units 01/16/2021 01/15/2021 01/14/2021  Glucose 70 - 99 mg/dL 01/16/2021) 761(Y) 073(X)  BUN 6 - 20 mg/dL 13 17 19   Creatinine 0.44 - 1.00 mg/dL 106(Y) ) 6.94(W)  Sodium 135 - 145 mmol/L 136 138 138  Potassium 3.5 - 5.1 mmol/L 3.4(L) 4.0 3.9  Chloride 98 - 111 mmol/L 104 105 105  CO2 22 - 32 mmol/L 24 24 25   Calcium 8.9 - 10.3 mg/dL 8.3(L) 8.5(L) 8.5(L)  Total Protein 6.5 - 8.1 g/dL - - -  Total Bilirubin 0.3 - 1.2 mg/dL - - -  Alkaline Phos 38 - 126 U/L - - -  AST 15 - 41 U/L - - -  ALT 0 - 44 U/L - - -     Radiology Studies: No results found.   Scheduled Meds:  (feeding supplement)  PROSource Plus  30 mL Oral BID WC   amiodarone  400 mg Oral BID   apixaban  5 mg Oral BID   Chlorhexidine Gluconate Cloth  6 each Topical Q0600   clonazepam  1 mg Oral BID   DULoxetine  60 mg Oral Daily   feeding supplement  237 mL Oral TID BM   fentaNYL  1 patch Transdermal Q72H   insulin aspart  0-20 Units Subcutaneous TID WC   insulin aspart  3 Units Subcutaneous TID WC   insulin glargine  10 Units Subcutaneous BID   magnesium oxide  400 mg Oral BID   mouth rinse  15 mL Mouth Rinse BID   multivitamin with minerals  1 tablet Oral Daily   pantoprazole  40 mg Oral Daily   QUEtiapine  50 mg Oral QHS   sertraline  25 mg Oral Daily   sodium chloride flush  10-40 mL Intracatheter Q12H   Continuous Infusions:  sodium chloride 250 mL (01/07/21 1223)   piperacillin-tazobactam (ZOSYN)  IV 3.375 g (01/17/21 0454)     LOS: 20 days   Time spent: 47 minutes  Rhetta Mura, MD [facility] To contact the attending provider between 7A-7P or the covering provider during after hours 7P-7A, please log into the web site [email or web address] and access using universal [name] password for that web site. If you do not have the password, please call the hospital operator.  01/17/2021, 7:53 AM

## 2021-01-17 NOTE — Progress Notes (Addendum)
Progress Note  Patient Name: [name] [name] Date of Encounter: 01/17/2021  [facility] Cardiologist: Chilton Si, MD   Subjective   No acute overnight events. Only complaint is continued buttock pain. No chest pain, shortness of breath, or palpitations.   Inpatient Medications    Scheduled Meds:  (feeding supplement) PROSource Plus  30 mL Oral BID WC   amiodarone  400 mg Oral BID   apixaban  5 mg Oral BID   Chlorhexidine Gluconate Cloth  6 each Topical Q0600   clonazepam  1 mg Oral BID   DULoxetine  60 mg Oral Daily   feeding supplement  237 mL Oral TID BM   fentaNYL  1 patch Transdermal Q72H   insulin aspart  0-20 Units Subcutaneous TID WC   insulin aspart  3 Units Subcutaneous TID WC   insulin glargine  10 Units Subcutaneous BID   magnesium oxide  400 mg Oral BID   mouth rinse  15 mL Mouth Rinse BID   multivitamin with minerals  1 tablet Oral Daily   pantoprazole  40 mg Oral Daily   QUEtiapine  50 mg Oral QHS   sertraline  25 mg Oral Daily   sodium chloride flush  10-40 mL Intracatheter Q12H   Continuous Infusions:  sodium chloride 250 mL (01/07/21 1223)   piperacillin-tazobactam (ZOSYN)  IV 3.375 g (01/17/21 0454)   PRN Meds: fentaNYL (SUBLIMAZE) injection, food thickener, haloperidol lactate, levalbuterol, LORazepam, ondansetron (ZOFRAN) IV, oxyCODONE, polyethylene glycol, sodium chloride flush   Vital Signs    Vitals:   01/16/21 2006 01/16/21 2353 01/17/21 0606 01/17/21 0820  BP: 104/61 94/64 98/68  108/72  Pulse: (!) 104 90 98 (!) 105  Resp: 19 18 18 17   Temp: 98 F (36.7 C) 98.2 F (36.8 C) 98.1 F (36.7 C) 97.9 F (36.6 C)  TempSrc:  Oral Oral Oral  SpO2: 99% 97% 98% 100%  Weight:      Height:        Intake/Output Summary (Last 24 hours) at 01/17/2021 0904 Last data filed at 01/17/2021 0900 Gross per 24 hour  Intake 950 ml  Output 1350 ml  Net -400 ml   Last 3 Weights 01/08/2021 01/08/2021 01/07/2021  Weight (lbs) 205 lb 14.6 oz 205 lb  14.6 oz 218 lb 11.1 oz  Weight (kg) 93.4 kg 93.4 kg 99.2 kg      Telemetry    Atrial flutter with rates in the low 100s. - Personally Reviewed  ECG    No new ECG tracing.- Personally Reviewed  Physical Exam   GEN: No acute distress.   Neck: No JVD. Cardiac: Borderline tachycardic with regular rhythm. No significant murmurs, rubs, or gallops.  Respiratory: Clear to auscultation bilaterally. No wheezes, rhonchi, or rales. GI: Soft, non--distended, and non-tender. MS: No lower extremity edema. No deformity. Skin: Warm and dry. Neuro:  No focal deficits. Psych: Normal affect.  Labs    High Sensitivity Troponin:   Recent Labs  Lab 12/30/20 1023  TROPONINIHS 88*      Chemistry Recent Labs  Lab 01/14/21 0842 01/15/21 0217 01/16/21 0023  NA 138 138 136  K 3.9 4.0 3.4*  CL 105 105 104  CO2 25 24 24   GLUCOSE 214* 142* 126*  BUN 19 17 13   CREATININE 1.19* 1.05* 1.05*  CALCIUM 8.5* 8.5* 8.3*  GFRNONAA 57* >60 >60  ANIONGAP 8 9 8      Hematology Recent Labs  Lab 01/14/21 0842 01/15/21 0217 01/16/21 0023 01/16/21 0735  WBC 4.5  4.0 5.9  --   RBC 2.44* 2.30* 2.37* 2.49*  HGB 7.9* 7.5* 7.5*  --   HCT 24.9* 23.3* 23.7*  --   MCV 102.0* 101.3* 100.0  --   MCH 32.4 32.6 31.6  --   MCHC 31.7 32.2 31.6  --   RDW 16.3* 16.0* 15.9*  --   PLT 330 277 275  --     BNPNo results for input(s): BNP, PROBNP in the last 168 hours.   DDimer No results for input(s): DDIMER in the last 168 hours.   Radiology    No results found.  Cardiac Studies   Echocardiogram 01/02/2021: Impressions:  1. Left ventricular ejection fraction, by estimation, is 45 to 50%. The  left ventricle has mildly decreased function. The left ventricle  demonstrates global hypokinesis. Left ventricular diastolic parameters  were normal.   2. Right ventricular systolic function is normal. The right ventricular  size is normal. There is normal pulmonary artery systolic pressure.   3. The mitral  valve is normal in structure. Mild mitral valve  regurgitation. No evidence of mitral stenosis.   4. The aortic valve is normal in structure. Aortic valve regurgitation is  not visualized. No aortic stenosis is present.   5. The inferior vena cava is normal in size with greater than 50%  respiratory variability, suggesting right atrial pressure of 3 mmHg.   Patient Profile     50 y.o. female with a history of hypertension, hyperlipidemia, diabetes, GERD, and morbid obesity who was presented to [facility] on 12/23/2020 with septic shock secondary to necrotizing soft tissue infection. She was transferred to [facility] on 12/28/2020. Initially required multiple pressors. S/p I&D on 6/6/ and 6/9.  Hospitalization complicated by acute respiratory failure requiring intubation (extubated on 6/12), acute oliguric renal failure requiring CRRT (stopped 6/14), encephalopathy with ICU delirium. She initially required multiple pressors. Underwen tCardiology initially consulted on 12/31/2020 for atrial flutter with RVR and prolonged QTc. We initially signed off on 01/08/2021. Patient's condition has since improved and we have been asked to see again to rediscuss possible DCCV.  Assessment & Plan   New Onset Atrial Fibrillation/Flutter - In the setting of critical illness and sepsis.  - Currently in atrial flutter with rates consistently in the low 100s.  - Initially started on Metoprolol but this was stopped due to hypotension. BP still soft with systolic BP in the 90s to low 938H. - Initially on IV Amiodarone but has been switched to PO. Currently on Amiodarone 400mg  twice daily since 6/19. Will decrease to 200mg  twice daily x2 weeks and then transition to 200mg  daily. - CHA2DS2-VASc = 4 (CHF, HTN, DM, female). Started on Eliquis 5mg  twice daily. - DCCV was not admitted earlier this admission due to patient not being able to be on anticoagulation. However, she has been on Eliquis since 6/19. Discussed with  MD, will plan for TEE/DCCV tomorrow.   Shared Decision Making/Informed Consent{ The risks [stroke, cardiac arrhythmias rarely resulting in the need for a temporary or permanent pacemaker, skin irritation or burns, esophageal damage, perforation (1:10,000 risk), bleeding, pharyngeal hematoma as well as other potential complications associated with conscious sedation including aspiration, arrhythmia, respiratory failure and death], benefits (treatment guidance, restoration of normal sinus rhythm, diagnostic support) and alternatives of a transesophageal echocardiogram guided cardioversion were discussed in detail with Ms. Lusher and she is willing to proceed.  Acute Combined CHF - BNP 1,323 on admission. - Echo showed LVEF of 45-50% with global hypokinesis.  -  Volume initially managed with CRRT but this was stopped on 6/14. Creatinine improved to 1.05. - Euvolemic on exam.  - Not currently on any GDMT due to soft BP. BP may improve some with restoration of sinus rhythm. - Continue to monitor volume status closely. - Slightly reduced EF likely due to acute illness/sepsis and atrial flutter with RVR. However, patient does have multiple cardiovascular risk factors including HTN, HLD, DM, and smoking history. Consider outpatient ischemic evaluation.  Hypertension - History of hypertension but admitted with septic shock. BP still soft. - Home Lisinopril-HCTZ held. Would continued to hold this.  Hyperlipidemia - History of hyperlipidemia but does not look like she has been on a statin. - Will check lipid panel tomorrow morning.  Oliguric Renal Failure - Creatinine as high as 5.97 on 6/3. AKI presumably due to ischemic ATN in setting of severe sepsis. - Nephrology was consulted and patient was started on CRRT. This was able to be stopped on 6/14. Renal function has continued to improve. She is able to make urine and creatinine was 1.05. - Nephrology signed off on 6/16.  Otherwise, per primary  team. - Septic shock: resolved - Necrotizing soft tissue infection of the left buttock s/p I&D on 6/6 and 6/9. - Encephalopathy:resolved - Diabetes - Anemia: hemoglobin stable at 7.5 - Dysphagia: improved - Anxiety/depression  For questions or updates, please contact [facility] Please consult [email or web address] for contact info under        Signed, Corrin Parker, PA-C  01/17/2021, 9:04 AM

## 2021-01-17 NOTE — Care Management (Signed)
    Durable Medical Equipment  (From admission, onward)           Start     Ordered   01/17/21 1323  For home use only DME Hospital bed  Once       Comments: Sister Randa Evens [phone] is contact person for delivery  Question Answer Comment  Length of Need 6 Months   Patient has (list medical condition): gluteal fold abscess and erythema and induration and necrotizing soft tissue infection of left buttock   The above medical condition requires: Patient requires the ability to reposition frequently   Head must be elevated greater than: 45 degrees   Bed type Semi-electric   Support Surface: Low Air loss Mattress      01/17/21 1323

## 2021-01-17 NOTE — Progress Notes (Signed)
   01/17/21 1812  What Happened  Was fall witnessed? No  Was patient injured? No  Patient found on floor  Found by Staff-comment  Stated prior activity to/from bed, chair, or stretcher  Follow Up  MD notified DR Mahala Menghini  Time MD notified [phone]  Family notified Yes - comment  Time family notified 1830  Additional tests No  Progress note created (see row info) Yes  Adult Fall Risk Assessment  Risk Factor Category (scoring not indicated) Fall has occurred during this admission (document High fall risk)  Age 50  Fall History: Fall within 6 months prior to admission 5  Elimination; Bowel and/or Urine Incontinence 2  Elimination; Bowel and/or Urine Urgency/Frequency 0  Medications: includes PCA/Opiates, Anti-convulsants, Anti-hypertensives, Diuretics, Hypnotics, Laxatives, Sedatives, and Psychotropics 3  Patient Care Equipment 1  Mobility-Assistance 2  Mobility-Gait 2  Mobility-Sensory Deficit 0  Altered awareness of immediate physical environment 0  Impulsiveness 0  Lack of understanding of one's physical/cognitive limitations 0  Total Score 15  Patient Fall Risk Level High fall risk  Adult Fall Risk Interventions  Required Bundle Interventions *See Row Information* High fall risk - low, moderate, and high requirements implemented  Additional Interventions Use of appropriate toileting equipment (bedpan, BSC, etc.)  Screening for Fall Injury Risk (To be completed on HIGH fall risk patients) - Assessing Need for Floor Mats  Risk For Fall Injury- Criteria for Floor Mats Noncompliant with safety precautions  Will Implement Floor Mats Yes  Vitals  Temp 98.4 F (36.9 C)  Temp Source Oral  BP 119/64  MAP (mmHg) 78  BP Location Left Arm  BP Method Automatic  Patient Position (if appropriate) Lying  Pulse Rate (!) 114  Pulse Rate Source Dinamap  Resp 16  Oxygen Therapy  SpO2 97 %  O2 Device Room Air  Pain Assessment  Pain Scale 0-10  Pain Score 0  Neurological  Neuro (WDL) WDL   Level of Consciousness Alert  Orientation Level Oriented X4  Cognition Appropriate at baseline  Speech Clear  Pupil Assessment  No  Glasgow Coma Scale  Eye Opening 4  Best Verbal Response (NON-intubated) 5  Best Motor Response 6  Glasgow Coma Scale Score 15  Musculoskeletal  Musculoskeletal (WDL) X  Generalized Weakness Yes  Weight Bearing Restrictions No  Integumentary  Integumentary (WDL) X  Skin Color Appropriate for ethnicity  Skin Integrity Surgical Incision (see LDA)  Pt reports that she was trying to reach for her ensure when she slipped from the bed to the floor. She mentioned that staff helped her to sit on the edge of the bed to have her dinner. Pt fell and alerted staff about the fall. Reporting nurse was charge nurse on day shift and was one of the first people to attend to the pt. Pt assessed for injuries and none noted. Vital signs as above. MD notified and no new orders given.

## 2021-01-17 NOTE — Progress Notes (Signed)
Pt called the nurse station and mentioned that she fell. Staff went to pt's room and found her sitting beside the bed. Pt stated that she was sitting on the edge of the bed while having dinner and she tried to reach for her drink and fell. She denied injuries or pain. Vitals checked and noted to have a heart rate of 115. Denies pain. MD contacted and notified. Pt also turned yellow MEWS. MD aware

## 2021-01-17 NOTE — Progress Notes (Signed)
Calorie Count Note  48 hour calorie count ordered.  Diet: regular Supplements: Ensure Enlive po TID, each supplement provides 350 kcal and 20 grams of protein; Magic cup BID with meals, each supplement provides 290 kcal and 9 grams of protein; 30 ml Prosource plus BID, each supplement provides 100 kcals and 15 grams protein  Spoke with pt at bedside, who reports intake is improving. She consumed 100% of her breakfast per her report. Pt shares that she feels overwhelmed by food and wide variety of options. Per pt, she consumes very little at baseline (Breakfast: half of a zebra cake, Lunch: half of a zebra cake; Dinner: meat, starch, and vegetable). Discussed importance of good meal and supplement intake to promote healing. She likes Ensure supplements and voiced plan to use in her coffee, cereal, etc.   Breakfast: 351 kcals, 15 grams protein Lunch: 58 kcals, 3 grams protein Dinner: 307 kcals, 21 grams protein Supplements: 1 Ensure Enlive (350 kcals, 20 grams protein); 2 Prosource Plus (200 kcals, 30 grams protein)  Total intake: 1266 kcal (58% of minimum estimated needs)  89 grams protein (68% of minimum estimated needs)  Nutrition Dx:   Increased nutrient needs related to wound healing, acute illness (L buttock necrotizing fasciitis) as evidenced by estimated needs; ongoing (being addressed with supplements and calorie count)  Goal: Patient will meet greater than or equal to 90% of their needs; progressing   Intervention:   -Continue 48 hour calorie count -Continue liberalized diet of regular -Continue Ensure Enlive po TID, each supplement provides 350 kcal and 20 grams of protein  -Continue Magic cup BID with meals, each supplement provides 290 kcal and 9 grams of protein  -Continue 30 ml Prosource plus BID, each supplement provides 100 kcals and 15 grams protein  [name] [name], RD, LDN, CDCES Registered Dietitian II Certified Diabetes Care and Education Specialist Please refer to  [facility] for RD and/or RD on-call/weekend/after hours pager

## 2021-01-17 NOTE — Progress Notes (Signed)
  Speech Language Pathology Treatment: Dysphagia  Patient Details Name: [name] [name] MRN: [id number] DOB: 1971/07/19 Today's Date: 01/17/2021 Time: 9798-9211 SLP Time Calculation (min) (ACUTE ONLY): 13 min  Assessment / Plan / Recommendation Clinical Impression  Pt was observed with regular solids and thin liquids, with no evidence of intolerance upon review of chart since repeat MBS was completed. She is afebrile, and her lung sounds are reported to be clear. Pt denies any subjective difficulties, and actually believes she is swallowing better than she was PTA because she is more "aware," taking her time and taking smaller bites/sips. She uses these strategies with Mod I while consuming POs with SLP, and no overt s/s of aspiration are noted. Education was provided about improvement on MBS suggestive of improved function post-extubation. Her main risk factor at this time would be her positioning, as she has trouble sitting fully upright due to her wounds. Education was provided about this and she was repositioned this morning as much as she could tolerate. Recommend continuing with regular solids and thin liquids - no further acute SLP f/u indicated.    HPI HPI: 50 y/o female initially admitted to [facility] on 5/29 complaining of fever and a buttock abscess, moved to [facility] on 6/3 in the setting of worsening renal failure, acidosis, encephaloapthy with known group B strep wound culture. ETT 6/4-6/12, hx GERD, obesity.      SLP Plan  Continue with current plan of care       Recommendations  Diet recommendations: Regular;Thin liquid Liquids provided via: Cup;Straw Medication Administration: Whole meds with puree Supervision: Patient able to self feed (set-up assistance for positioning) Compensations: Small sips/bites Postural Changes and/or Swallow Maneuvers: Seated upright 90 degrees                Oral Care Recommendations: Oral care BID Follow up Recommendations: None SLP Visit  Diagnosis: Dysphagia, pharyngeal phase (R13.13) Plan: Continue with current plan of care       GO                Mahala Menghini., M.A. CCC-SLP Acute Rehabilitation Services Pager [phone] Office [phone]  01/17/2021, 9:51 AM

## 2021-01-17 NOTE — Progress Notes (Signed)
   01/17/21 1812  Assess: MEWS Score  Temp 98.4 F (36.9 C)  BP 119/64  Pulse Rate (!) 114  Resp 16  Level of Consciousness Alert  SpO2 97 %  O2 Device Room Air  Assess: MEWS Score  MEWS Temp 0  MEWS Systolic 0  MEWS Pulse 2  MEWS RR 0  MEWS LOC 0  MEWS Score 2  MEWS Score Color Yellow  Assess: if the MEWS score is Yellow or Red  Were vital signs taken at a resting state? Yes  Focused Assessment No change from prior assessment  Early Detection of Sepsis Score *See Row Information* Low  MEWS guidelines implemented *See Row Information* Yes  Treat  Pain Scale 0-10  Take Vital Signs  Increase Vital Sign Frequency  Yellow: Q 2hr X 2 then Q 4hr X 2, if remains yellow, continue Q 4hrs  Escalate  MEWS: Escalate Yellow: discuss with charge nurse/RN and consider discussing with provider and RRT  Notify: Charge Nurse/RN  Name of Charge Nurse/RN Notified Ben B  Date Charge Nurse/RN Notified 01/17/21  Time Charge Nurse/RN Notified 1815  Notify: Provider  Provider Name/Title Dr Mahala Menghini  Date Provider Notified 01/17/21  Time Provider Notified 1825  Notification Type Page  Notification Reason Other (Comment) (fall/yellow MEWS)  Provider response No new orders  Date of Provider Response 01/17/21  Time of Provider Response 1835  Document  Patient Outcome Other (Comment) (pt condition stable)  Progress note created (see row info) Yes  Pt found sitting beside bed after having an unwitnessed fall. Vitals showed yellow MEWS. MD notified. Pt had no injuries

## 2021-01-17 NOTE — Progress Notes (Signed)
1835 RN was alerted that Pt Larey Seat while this RN was on lunch. This RN Reassessed the Pt. PT was calm alert and oriented , she reverted back to yellow MEWS. We will continue to assess per protocol . Md was alerted by the Charge nurse.   RN attempted to call Pt's Sister Mickle Asper two times with no success. I left a voicemail stating to call us back when available. Pt states Sister is aware that she fell.   Pt will continue to be monitored.

## 2021-01-17 NOTE — TOC Initial Note (Addendum)
Transition of Care [facility]) - Initial/Assessment Note    Patient Details  Name: [name] [name] MRN: [id number] Date of Birth: Nov 20, 1970  Transition of Care [facility]) CM/SW Contact:    Kingsley Plan, RN Phone Number: 01/17/2021, 12:08 PM  Clinical Narrative:                 Spoke to patient at bedside. Discuss Medicaid does not have a LTAC benefit. Patient voiced understanding and states she wants to go home at discharge.  She has an aide 3 hours a day , 7 days a week. Her sister can stay with her at night and her 40 year old son will be with her during the day.   She already has a walker and bedside commode at home.   She would like home health RN [facility] ( NCM will need to call to see if they will accept) . She is aware HHRN will not be there daily to change her dressing. Patient states her sister can assist with her wound care. She would also like to have a referral to go to the wound Care Center in [place].   Her PCP is DR Lerry Liner in [place].   NCM discussed above with PT in hallway and sent MD secure chat .NCM will need potential discharge date and wound care orders before trying to arrange home health   Called patient's sister and confirmed all of the above.   Randa Evens [phone] can assist with dressing changes. Vernona Rieger does work from [employer] to 1700 but there are other family members who can assist with wound care through the day.   Patient's bedroom is upstairs. Vernona Rieger requesting hospital bed with air mattress. NCM ordered same through [facility], Vernona Rieger is the contact person for delivery.   Sunday Spillers at [facility] and they do not have staff to accept referral.  [facility] reviewing referral and will call NCM back with determination. Barbara Cower unable to accept referral   Delila Spence declined referral due to staffing   [facility] left message Elnita Maxwell returned call and they are not  in network with patient's insurance.   [facility] left message .Misty Stanley returned call they are not in network with insurance   [facility], spoke with [facility] she will check to see if they are in network with patient's insurance. Stacie unable to accept referral   [place] spoke to [place] they are not in network with patient's insurance.   [facility] spoke with Eber Jones they are not in network with patient's insurance   [place] spoke with Marylene Land they are not in network with patient's insurance.   Expected Discharge Plan: Home w Home Health Services     Patient Goals and CMS Choice Patient states their goals for this hospitalization and ongoing recovery are:: to return to home CMS Medicare.gov Compare Post Acute Care list provided to:: Patient Choice offered to / list presented to : Patient  Expected Discharge Plan and Services Expected Discharge Plan: Home w Home Health Services   Discharge Planning Services: CM Consult Post Acute Care Choice: Home Health                   DME Arranged: N/A         HH Arranged: RN, PT          Prior Living Arrangements/Services   Lives with::  (36 year old son) Patient language and need  for interpreter reviewed:: Yes        Need for Family Participation in Patient Care: Yes (Comment) Care giver support system in place?: Yes (comment) Current home services: DME Criminal Activity/Legal Involvement Pertinent to Current Situation/Hospitalization: No - Comment as needed  Activities of Daily Living Home Assistive Devices/Equipment: None ADL Screening (condition at time of admission) Patient's cognitive ability adequate to safely complete daily activities?: No Is the patient deaf or have difficulty hearing?: No Does the patient have difficulty seeing, even when wearing glasses/contacts?: No Does the patient have difficulty concentrating, remembering, or making decisions?: Yes Patient able to express need for assistance with ADLs?:  No Does the patient have difficulty dressing or bathing?: Yes Independently performs ADLs?: No Communication: Dependent Is this a change from baseline?: Change from baseline, expected to last >3 days Dressing (OT): Dependent Is this a change from baseline?: Change from baseline, expected to last >3 days Grooming: Dependent Is this a change from baseline?: Change from baseline, expected to last >3 days Feeding: Dependent Is this a change from baseline?: Change from baseline, expected to last >3 days Bathing: Dependent Is this a change from baseline?: Change from baseline, expected to last >3 days Toileting: Dependent Is this a change from baseline?: Change from baseline, expected to last >3days In/Out Bed: Dependent Is this a change from baseline?: Change from baseline, expected to last >3 days Walks in Home: Dependent Is this a change from baseline?: Change from baseline, expected to last >3 days Does the patient have difficulty walking or climbing stairs?: Yes Weakness of Legs: Both Weakness of Arms/Hands: Both  Permission Sought/Granted   Permission granted to share information with : No              Emotional Assessment Appearance:: Appears stated age Attitude/Demeanor/Rapport: Engaged Affect (typically observed): Accepting Orientation: : Oriented to Self, Oriented to Place, Oriented to  Time, Oriented to Situation Alcohol / Substance Use: Not Applicable Psych Involvement: No (comment)  Admission diagnosis:  Septic shock (HCC) [A41.9, R65.21] Patient Active Problem List   Diagnosis Date Noted   Encounter for central line placement    Encounter for orogastric (OG) tube placement    Hyperkalemia 01/05/2021   New onset a-fib (HCC) 01/05/2021   Acute combined systolic and diastolic heart failure (HCC) 01/05/2021   Acute metabolic encephalopathy 01/05/2021   History of ETT    Acute hypercapnic respiratory failure (HCC)    Typical atrial flutter (HCC)    Necrotizing  soft tissue infection 12/29/2020   Sepsis due to Streptococcus, group B (HCC) 12/29/2020   Acute kidney injury (HCC) 12/29/2020   Obesity 12/29/2020   Type 2 diabetes mellitus with hyperglycemia (HCC) 12/29/2020   Septic shock (HCC) 12/28/2020   PCP:  Pcp, No Pharmacy:   [facility] [phone] - [place]ita Levan, [place] - [address] AT [facility] [address] [place] [address] Phone: [phone] Fax: [phone]     Social Determinants of Health (SDOH) Interventions    Readmission Risk Interventions No flowsheet data found.

## 2021-01-17 NOTE — Progress Notes (Signed)
Physical Therapy Wound Treatment Patient Details  Name: [name] [name] MRN: [id number] Date of Birth: 07/22/1971  Today's Date: 01/17/2021 Time: 1205-1245 Time Calculation (min): 40 min  Subjective  Subjective Assessment Subjective: alert, pleasant and talkative Patient and Family Stated Goals: None stated Date of Onset:  (Unsure) Prior Treatments: I&D 6/9  Pain Score:  Pt did not complain of pain throughout session.   Wound Assessment  Wound / Incision (Open or Dehisced) 12/28/20 Buttocks Left cyst- reddened, swollen area with brown drainage coming from left lower buttocks (Active)  Dressing Type ABD;Barrier Film (skin prep);Gauze (Comment);Moist to moist 01/17/21 1347  Dressing Changed Changed 01/17/21 1347  Dressing Status Clean;Dry;Intact 01/17/21 1347  Dressing Change Frequency Daily 01/17/21 1347  Site / Wound Assessment Red;Yellow 01/17/21 1347  % Wound base Red or Granulating 70% 01/17/21 1347  % Wound base Yellow/Fibrinous Exudate 30% 01/17/21 1347  % Wound base Black/Eschar 0% 01/17/21 1347  % Wound base Other/Granulation Tissue (Comment) 0% 01/17/21 1347  Peri-wound Assessment Intact;Maceration;Purple;Pink 01/17/21 1347  Wound Length (cm) 19 cm 01/15/21 1100  Wound Width (cm) 20 cm 01/15/21 1100  Wound Depth (cm) 4.5 cm 01/15/21 1100  Wound Volume (cm^3) 1710 cm^3 01/15/21 1100  Wound Surface Area (cm^2) 380 cm^2 01/15/21 1100  Tunneling (cm) 6 cm 6:00, 3.5 cm at 8:00 01/15/21 1100  Undermining (cm) up to 3 cm from 9:00 to 12:00 01/15/21 1250  Margins Unattached edges (unapproximated) 01/17/21 1347  Closure None 01/17/21 1347  Drainage Amount Minimal 01/17/21 1347  Drainage Description Serosanguineous 01/17/21 1347  Non-staged Wound Description Full thickness 01/17/21 1347  Treatment Debridement (Selective);Hydrotherapy (Pulse lavage);Packing (Saline gauze) 01/17/21 1347   Hydrotherapy Pulsed lavage therapy - wound location: L buttock Pulsed Lavage with Suction  (psi): 12 psi Pulsed Lavage with Suction - Normal Saline Used: 1000 mL Pulsed Lavage Tip: Tip with splash shield Selective Debridement Selective Debridement - Location: L buttock Selective Debridement - Tools Used: Forceps, Scissors Selective Debridement - Tissue Removed: loose yellow slough    Wound Assessment and Plan  Wound Therapy - Assess/Plan/Recommendations Wound Therapy - Clinical Statement: Still progressing with debridement. Wound bed with increased granulation however undermining and tunnels still with a good amount of slough mainly on the right side of the wound 3:00-4:00. Will continue with hydrotherapy for selective removal of unviable tissue, to decrease bioburden and promote wound bed healing. Wound Therapy - Functional Problem List: Decreased tolerance for OOB to chair and position changes due to pain and decreased skin integrity. Factors Delaying/Impairing Wound Healing: Infection - systemic/local, Incontinence Hydrotherapy Plan: Debridement, Dressing change, Patient/family education, Pulsatile lavage with suction Wound Therapy - Frequency: 6X / week Wound Therapy - Follow Up Recommendations: dressing changes by RN  Wound Therapy Goals- Improve the function of patient's integumentary system by progressing the wound(s) through the phases of wound healing (inflammation - proliferation - remodeling) by: Wound Therapy Goals - Improve the function of patient's integumentary system by progressing the wound(s) through the phases of wound healing by: Decrease Necrotic Tissue to: 0% Decrease Necrotic Tissue - Progress: Progressing toward goal Increase Granulation Tissue to: 100% Increase Granulation Tissue - Progress: Progressing toward goal Goals/treatment plan/discharge plan were made with and agreed upon by patient/family: Yes Time For Goal Achievement: 7 days Wound Therapy - Potential for Goals: Good  Goals will be updated until maximal potential achieved or discharge  criteria met.  Discharge criteria: when goals achieved, discharge from hospital, MD decision/surgical intervention, no progress towards goals, refusal/missing three consecutive treatments without notification or  medical reason.  GP     Charges PT Wound Care Charges $Wound Debridement up to 20 cm: < or equal to 20 cm $ Wound Debridement each add'l 20 sqcm: 5 $PT PLS Gun and Tip: 1 Supply $PT Hydrotherapy Visit: 1 Visit       Thelma Comp 01/17/2021, 2:00 PM  Rolinda Roan, PT, DPT Acute Rehabilitation Services Pager: [phone] Office: [phone]

## 2021-01-17 NOTE — Progress Notes (Signed)
PT Cancellation Note  Patient Details Name: [name] [name] MRN: [id number] DOB: 25-Jul-1971   Cancelled Treatment:    Reason Eval/Treat Not Completed: Other (comment).  Pt is up in bed and asking to wait for early meal, after earlier being in wound care.  Now is with nursing for a care session, and so will retry at another time.   Ivar Drape 01/17/2021, 5:05 PM  Samul Dada, PT MS Acute Rehab Dept. Number: [facility] [id number] and [facility] [phone]

## 2021-01-17 NOTE — Progress Notes (Signed)
Inpatient Diabetes Program Recommendations  AACE/ADA: New Consensus Statement on Inpatient Glycemic Control   Target Ranges:  Prepandial:   less than 140 mg/dL      Peak postprandial:   less than 180 mg/dL (1-2 hours)      Critically ill patients:  140 - 180 mg/dL   Results for JANIN, KOZLOWSKI (MRN [id number]) as of 01/17/2021 10:13  Ref. Range 01/16/2021 08:14 01/16/2021 13:03 01/16/2021 13:37 01/16/2021 16:40 01/16/2021 20:06 01/16/2021 21:26 01/16/2021 23:09 01/17/2021 04:44 01/17/2021 07:45 01/17/2021 08:02  Glucose-Capillary Latest Ref Range: 70 - 99 mg/dL 86 728 (H) 206 (H) 015 (H) 78 75 111 (H) 82 59 (L) 81    Review of Glycemic Control  Current orders for Inpatient glycemic control: Lantus 10 units BID, Novolog 0-20 units TID with meals, Novolog 3 units TID with meals  Inpatient Diabetes Program Recommendations:    Insulin: Please consider decreasing Lantus to 7 units BID.  Thanks, Orlando Penner, RN, MSN, CDE Diabetes Coordinator Inpatient Diabetes Program [phone] (Team Pager from 8am to 5pm)

## 2021-01-18 ENCOUNTER — Inpatient Hospital Stay (HOSPITAL_COMMUNITY): Payer: Medicaid Other | Admitting: Certified Registered"

## 2021-01-18 ENCOUNTER — Inpatient Hospital Stay (HOSPITAL_COMMUNITY): Payer: Medicaid Other

## 2021-01-18 ENCOUNTER — Encounter (HOSPITAL_COMMUNITY)
Admission: AD | Disposition: A | Discharge status: Home Health | Payer: Self-pay | Source: Other Acute Inpatient Hospital | Attending: Pulmonary Disease

## 2021-01-18 ENCOUNTER — Encounter (HOSPITAL_COMMUNITY): Payer: Self-pay | Admitting: Critical Care Medicine

## 2021-01-18 DIAGNOSIS — I4892 Unspecified atrial flutter: Secondary | ICD-10-CM

## 2021-01-18 HISTORY — PX: CARDIOVERSION: SHX1299

## 2021-01-18 HISTORY — PX: TEE WITHOUT CARDIOVERSION: SHX5443

## 2021-01-18 LAB — MAGNESIUM: Magnesium: 1.8 mg/dL (ref 1.7–2.4)

## 2021-01-18 LAB — GLUCOSE, CAPILLARY
Glucose-Capillary: 101 mg/dL — ABNORMAL HIGH (ref 70–99)
Glucose-Capillary: 82 mg/dL (ref 70–99)
Glucose-Capillary: 91 mg/dL (ref 70–99)
Glucose-Capillary: 119 mg/dL — ABNORMAL HIGH (ref 70–99)
Glucose-Capillary: 123 mg/dL — ABNORMAL HIGH (ref 70–99)

## 2021-01-18 LAB — LIPID PANEL
Cholesterol: 107 mg/dL (ref 0–200)
HDL: 25 mg/dL — ABNORMAL LOW (ref >40)
LDL Cholesterol: 63 mg/dL (ref 0–99)
Total CHOL/HDL Ratio: 4.3 RATIO
Triglycerides: 93 mg/dL (ref <150)
VLDL: 19 mg/dL (ref 0–40)

## 2021-01-18 LAB — COMPREHENSIVE METABOLIC PANEL
ALT: 15 U/L (ref 0–44)
AST: 22 U/L (ref 15–41)
Albumin: 2 g/dL — ABNORMAL LOW (ref 3.5–5.0)
Alkaline Phosphatase: 154 U/L — ABNORMAL HIGH (ref 38–126)
Anion gap: 9 (ref 5–15)
BUN: 12 mg/dL (ref 6–20)
CO2: 26 mmol/L (ref 22–32)
Calcium: 8.5 mg/dL — ABNORMAL LOW (ref 8.9–10.3)
Chloride: 101 mmol/L (ref 98–111)
Creatinine, Ser: 1.06 mg/dL — ABNORMAL HIGH (ref 0.44–1.00)
GFR, Estimated: >60 mL/min (ref >60)
Glucose, Bld: 131 mg/dL — ABNORMAL HIGH (ref 70–99)
Potassium: 3.6 mmol/L (ref 3.5–5.1)
Sodium: 136 mmol/L (ref 135–145)
Total Bilirubin: 0.8 mg/dL (ref 0.3–1.2)
Total Protein: 6.5 g/dL (ref 6.5–8.1)

## 2021-01-18 SURGERY — ECHOCARDIOGRAM, TRANSESOPHAGEAL
Anesthesia: Monitor Anesthesia Care

## 2021-01-18 MED ORDER — ONDANSETRON HCL 4 MG/2ML IJ SOLN
INTRAMUSCULAR | Status: DC | PRN
  Administered 2021-01-18: 4 mg via INTRAVENOUS

## 2021-01-18 MED ORDER — INSULIN GLARGINE 100 UNIT/ML ~~LOC~~ SOLN: 5.0000 [IU] | Freq: Two times a day (BID) | SUBCUTANEOUS | 11 refills | Status: AC

## 2021-01-18 MED ORDER — SERTRALINE HCL 50 MG PO TABS: 25.0000 mg | ORAL_TABLET | Freq: Every day | ORAL | 5 refills | Status: AC

## 2021-01-18 MED ORDER — AMIODARONE HCL 200 MG PO TABS: 200.0000 mg | ORAL_TABLET | Freq: Two times a day (BID) | ORAL | 0 refills | Status: AC

## 2021-01-18 MED ORDER — SODIUM CHLORIDE 0.9 % IV SOLN
INTRAVENOUS | Status: AC | PRN
  Administered 2021-01-18: 500 mL via INTRAVENOUS

## 2021-01-18 MED ORDER — PROSOURCE PLUS PO LIQD
30.0000 mL | Freq: Three times a day (TID) | ORAL | Status: DC
  Administered 2021-01-19: 30 mL via ORAL
  Filled 2021-01-18: qty 30

## 2021-01-18 MED ORDER — BUTAMBEN-TETRACAINE-BENZOCAINE 2-2-14 % EX AERO
INHALATION_SPRAY | CUTANEOUS | Status: DC | PRN
  Administered 2021-01-18: 2 via TOPICAL

## 2021-01-18 MED ORDER — ENSURE ENLIVE PO LIQD
237.0000 mL | Freq: Three times a day (TID) | ORAL | Status: DC
  Administered 2021-01-18 – 2021-01-19 (×4): 237 mL via ORAL

## 2021-01-18 MED ORDER — PROPOFOL 500 MG/50ML IV EMUL
INTRAVENOUS | Status: DC | PRN
  Administered 2021-01-18: 200 ug/kg/min via INTRAVENOUS

## 2021-01-18 MED ORDER — OXYCODONE HCL 10 MG PO TABS: 10.0000 mg | ORAL_TABLET | Freq: Three times a day (TID) | ORAL | 0 refills | Status: DC | PRN

## 2021-01-18 MED ORDER — APIXABAN 5 MG PO TABS: 5.0000 mg | ORAL_TABLET | Freq: Two times a day (BID) | ORAL | 12 refills | Status: AC

## 2021-01-18 MED ORDER — SODIUM CHLORIDE 0.9 % IV SOLN: INTRAVENOUS | Status: DC

## 2021-01-18 MED ORDER — AMIODARONE HCL 200 MG PO TABS
200.0000 mg | ORAL_TABLET | Freq: Every day | ORAL | 3 refills | Status: AC
Start: 2021-01-31 — End: ?

## 2021-01-18 MED ORDER — BLOOD GLUCOSE MONITOR KIT: PACK | 0 refills | Status: AC

## 2021-01-18 MED ORDER — CLONAZEPAM 1 MG PO TBDP: 1.0000 mg | ORAL_TABLET | Freq: Two times a day (BID) | ORAL | 0 refills | Status: DC

## 2021-01-18 NOTE — Transfer of Care (Signed)
Immediate Anesthesia Transfer of Care Note  Patient: [name] [name]  Procedure(s) Performed: TRANSESOPHAGEAL ECHOCARDIOGRAM (TEE) CARDIOVERSION  Patient Location: Endoscopy Unit  Anesthesia Type:MAC  Level of Consciousness: sedated, patient cooperative and responds to stimulation  Airway & Oxygen Therapy: Patient Spontanous Breathing and Patient connected to nasal cannula oxygen  Post-op Assessment: Report given to RN, Post -op Vital signs reviewed and stable and Patient moving all extremities  Post vital signs: Reviewed and stable  Last Vitals:  Vitals Value Taken Time  BP 84/47 01/18/21 1135  Temp    Pulse 74 01/18/21 1136  Resp 14 01/18/21 1136  SpO2 98 % 01/18/21 1136  Vitals shown include unvalidated device data.  Last Pain:  Vitals:   01/18/21 1032  TempSrc: Oral  PainSc: 0-No pain      Patients Stated Pain Goal: 3 (02/06/51 4799)  Complications: No notable events documented.

## 2021-01-18 NOTE — Progress Notes (Deleted)
  Echocardiogram 2D Echocardiogram has been performed.  [name] [name] 01/18/2021, 11:54 AM

## 2021-01-18 NOTE — Interval H&P Note (Signed)
History and Physical Interval Note:  01/18/2021 10:48 AM  [name] [name]  has presented today for surgery, with the diagnosis of afib.  The various methods of treatment have been discussed with the patient and family. After consideration of risks, benefits and other options for treatment, the patient has consented to  Procedure(s): TRANSESOPHAGEAL ECHOCARDIOGRAM (TEE) (N/A) CARDIOVERSION (N/A) as a surgical intervention.  The patient's history has been reviewed, patient examined, no change in status, stable for surgery.  I have reviewed the patient's chart and labs.  Questions were answered to the patient's satisfaction.     Chilton Si, MD

## 2021-01-18 NOTE — Progress Notes (Addendum)
Progress Note  Patient Name: [name] [name] Date of Encounter: 01/18/2021  [facility] Cardiologist: Chilton Si, MD   Subjective   Denies any chest pain, SOB or palpitations.  Nervous about the TEE/DCCV  Inpatient Medications    Scheduled Meds:  (feeding supplement) PROSource Plus  30 mL Oral BID WC   amiodarone  200 mg Oral BID   Followed by   [name] 01/31/2021] amiodarone  200 mg Oral Daily   apixaban  5 mg Oral BID   Chlorhexidine Gluconate Cloth  6 each Topical Q0600   clonazepam  1 mg Oral BID   DULoxetine  60 mg Oral Daily   feeding supplement  237 mL Oral TID BM   insulin aspart  0-20 Units Subcutaneous TID WC   insulin aspart  3 Units Subcutaneous TID WC   insulin glargine  5 Units Subcutaneous BID   magnesium oxide  400 mg Oral BID   mouth rinse  15 mL Mouth Rinse BID   multivitamin with minerals  1 tablet Oral Daily   pantoprazole  40 mg Oral Daily   QUEtiapine  25 mg Oral QHS   sertraline  25 mg Oral Daily   sodium chloride flush  10-40 mL Intracatheter Q12H   Continuous Infusions:  sodium chloride 250 mL (01/07/21 1223)   PRN Meds: food thickener, haloperidol lactate, levalbuterol, LORazepam, ondansetron (ZOFRAN) IV, oxyCODONE, polyethylene glycol, sodium chloride flush   Vital Signs    Vitals:   01/17/21 2002 01/17/21 2352 01/18/21 0416 01/18/21 0958  BP: 106/70 (!) 106/58 109/62 109/62  Pulse: (!) 110 (!) 102 (!) 106 (!) 113  Resp: 18 18 18 16   Temp: 98.4 F (36.9 C) 98.8 F (37.1 C) 98.1 F (36.7 C) 98.1 F (36.7 C)  TempSrc: Oral Oral Oral Oral  SpO2: 97% 94% 96% 99%  Weight:      Height:        Intake/Output Summary (Last 24 hours) at 01/18/2021 1007 Last data filed at 01/18/2021 0842 Gross per 24 hour  Intake 970 ml  Output 1380 ml  Net -410 ml   Last 3 Weights 01/08/2021 01/08/2021 01/07/2021  Weight (lbs) 205 lb 14.6 oz 205 lb 14.6 oz 218 lb 11.1 oz  Weight (kg) 93.4 kg 93.4 kg 99.2 kg      Telemetry    Atrial  fibrillation with RVR in the low 100's Personally Reviewed  ECG    N/a  Physical Exam   GEN: No acute distress.   Neck: No JVD Cardiac: irregularly irregular, no murmurs, rubs, or gallops.  Respiratory: Clear to auscultation bilaterally. GI: Soft, nontender, non-distended  MS: No edema; No deformity. Neuro:  Nonfocal  Psych: Normal affect   Labs    High Sensitivity Troponin:   Recent Labs  Lab 12/30/20 1023  TROPONINIHS 88*      Chemistry Recent Labs  Lab 01/16/21 0023 01/17/21 1117 01/18/21 0209  NA 136 138 136  K 3.4* 5.0 3.6  CL 104 107 101  CO2 24 21* 26  GLUCOSE 126* 86 131*  BUN 13 13 12   CREATININE 1.05* 1.11* 1.06*  CALCIUM 8.3* 8.6* 8.5*  PROT  --  7.2 6.5  ALBUMIN  --  2.2* 2.0*  AST  --  34 22  ALT  --  16 15  ALKPHOS  --  154* 154*  BILITOT  --  0.7 0.8  GFRNONAA >60 >60 >60  ANIONGAP 8 10 9      Hematology Recent Labs  Lab  01/14/21 0842 01/15/21 0217 01/16/21 0023 01/16/21 0735  WBC 4.5 4.0 5.9  --   RBC 2.44* 2.30* 2.37* 2.49*  HGB 7.9* 7.5* 7.5*  --   HCT 24.9* 23.3* 23.7*  --   MCV 102.0* 101.3* 100.0  --   MCH 32.4 32.6 31.6  --   MCHC 31.7 32.2 31.6  --   RDW 16.3* 16.0* 15.9*  --   PLT 330 277 275  --     Radiology    No results found.  Cardiac Studies   Echocardiogram 01/02/2021: Impressions:  1. Left ventricular ejection fraction, by estimation, is 45 to 50%. The  left ventricle has mildly decreased function. The left ventricle  demonstrates global hypokinesis. Left ventricular diastolic parameters  were normal.   2. Right ventricular systolic function is normal. The right ventricular  size is normal. There is normal pulmonary artery systolic pressure.   3. The mitral valve is normal in structure. Mild mitral valve  regurgitation. No evidence of mitral stenosis.   4. The aortic valve is normal in structure. Aortic valve regurgitation is  not visualized. No aortic stenosis is present.   5. The inferior vena cava is  normal in size with greater than 50%  respiratory variability, suggesting right atrial pressure of 3 mmHg.  Patient Profile     50 y.o. female with a history of hypertension, hyperlipidemia, diabetes, GERD, and morbid obesity who was presented to [facility] on 12/23/2020 with septic shock secondary to necrotizing soft tissue infection. She was transferred to [facility] on 12/28/2020. Initially required multiple pressors. S/p I&D on 6/6/ and 6/9.  Hospitalization complicated by acute respiratory failure requiring intubation (extubated on 6/12), acute oliguric renal failure requiring CRRT (stopped 6/14), encephalopathy with ICU delirium. She initially required multiple pressors. [facility] tCardiology initially consulted on 12/31/2020 for atrial flutter with RVR and prolonged QTc. We initially signed off on 01/08/2021. Patient's condition has since improved and we have been asked to see again to rediscuss possible DCCV  Assessment & Plan   New Onset Atrial Fibrillation/Flutter - In the setting of critical illness and sepsis. - Currently in atrial fibrillation with rates consistently in the low 100s despite loaded with amiodarone.  - Plan for TEE/DCCV today  - Continue Amio 200mg  twice daily x2 weeks and then transition to 200mg  daily. - CHA2DS2-VASc = 4 (CHF, HTN, DM, female).  - Continue Eliquis 5mg  twice daily.  Acute Combined CHF - BNP 1,323 on admission. - Echo showed LVEF of 45-50% with global hypokinesis possibly tachy related - Volume initially managed with CRRT but this was stopped on 6/14. Creatinine improved to 1.05. - she dose not appear volume overloaded on exam today - Not currently on any GDMT due to soft BP.   -BP may improve some with restoration of sinus rhythm.  Will reassess outpt - Slightly reduced EF likely due to acute illness/sepsis and atrial flutter with RVR. However, patient does have multiple cardiovascular risk factors including HTN, HLD, DM, and smoking history.  -  will repeat 2D echo in 2 months after restoration of NSR and if EF still reduced then will consider ischemic workup for coronary CTA   Hypertension - BP improved and ranging from 109-138/62-55mmHg   Hyperlipidemia -01/18/2021: Cholesterol 107; HDL 25; LDL Cholesterol 63; Triglycerides 93; VLDL 19   Oliguric Renal Failure - resolved   For questions or updates, please contact [facility] Please consult [email or web address] for contact info under        Signed, Zaila Crew  Mayford Knife, MD [facility] 01/18/2021, 10:07 AM

## 2021-01-18 NOTE — Progress Notes (Signed)
Day of Surgery   Subjective/Chief Complaint: Son and family in the room with patient.  She is in good spirits with no complaints.  Objective: Vital signs in last 24 hours: Temp:  [98.1 F (36.7 C)-98.8 F (37.1 C)] 98.6 F (37 C) (06/24 1135) Pulse Rate:  [75-114] 83 (06/24 1155) Resp:  [15-25] 25 (06/24 1155) BP: (84-138)/(47-85) 113/64 (06/24 1155) SpO2:  [94 %-100 %] 98 % (06/24 1155) Last BM Date: 01/18/21  Intake/Output from previous day: 06/23 0701 - 06/24 0700 In: 1090 [P.O.:1040; IV Piggyback:50] Out: 1380 [Urine:1380] Intake/Output this shift: No intake/output data recorded.   Incision/Wound: overall very clean with no purulent drainage.  Good beefy red granulation tissue.  Minimal fibrin in most medial superior aspect of the wound    Lab Results:  Recent Labs    01/16/21 0023  WBC 5.9  HGB 7.5*  HCT 23.7*  PLT 275   BMET Recent Labs    01/17/21 1117 01/18/21 0209  NA 138 136  K 5.0 3.6  CL 107 101  CO2 21* 26  GLUCOSE 86 131*  BUN 13 12  CREATININE 1.11* 1.06*  CALCIUM 8.6* 8.5*   PT/INR  Anti-infectives: Anti-infectives (From admission, onward)    Start     Dose/Rate Route Frequency Ordered Stop   01/16/21 1824  piperacillin-tazobactam (ZOSYN) IVPB 3.375 g        3.375 g 12.5 mL/hr over 240 Minutes Intravenous Every 8 hours 01/16/21 1825 01/18/21 0218   01/10/21 2200  piperacillin-tazobactam (ZOSYN) IVPB 3.375 g  Status:  Discontinued        3.375 g 12.5 mL/hr over 240 Minutes Intravenous Every 8 hours 01/10/21 1421 01/16/21 1548   01/09/21 2200  piperacillin-tazobactam (ZOSYN) IVPB 3.375 g  Status:  Discontinued        3.375 g 12.5 mL/hr over 240 Minutes Intravenous Every 12 hours 01/09/21 1146 01/10/21 1421   01/08/21 1400  piperacillin-tazobactam (ZOSYN) IVPB 3.375 g  Status:  Discontinued        3.375 g 12.5 mL/hr over 240 Minutes Intravenous Every 8 hours 01/08/21 0902 01/09/21 1146   01/04/21 1800  piperacillin-tazobactam (ZOSYN)  IVPB 3.375 g  Status:  Discontinued        3.375 g 100 mL/hr over 30 Minutes Intravenous Every 6 hours 01/04/21 1059 01/08/21 0902   01/03/21 1400  piperacillin-tazobactam (ZOSYN) IVPB 3.375 g  Status:  Discontinued        3.375 g 12.5 mL/hr over 240 Minutes Intravenous Every 8 hours 01/03/21 1124 01/04/21 1059   01/01/21 1800  piperacillin-tazobactam (ZOSYN) IVPB 3.375 g  Status:  Discontinued        3.375 g 12.5 mL/hr over 240 Minutes Intravenous Every 6 hours 01/01/21 1059 01/03/21 1124   12/31/20 1400  piperacillin-tazobactam (ZOSYN) IVPB 3.375 g  Status:  Discontinued        3.375 g 12.5 mL/hr over 240 Minutes Intravenous Every 6 hours 12/31/20 0918 01/01/21 1059   12/29/20 1400  clindamycin (CLEOCIN) IVPB 900 mg  Status:  Discontinued        900 mg 100 mL/hr over 30 Minutes Intravenous Every 8 hours 12/29/20 1214 12/29/20 1234   12/28/20 2200  linezolid (ZYVOX) IVPB 600 mg  Status:  Discontinued        600 mg 300 mL/hr over 60 Minutes Intravenous Every 12 hours 12/28/20 1620 01/02/21 0920   12/28/20 1830  meropenem (MERREM) 1 g in sodium chloride 0.9 % 100 mL IVPB  Status:  Discontinued  1 g 200 mL/hr over 30 Minutes Intravenous Every 8 hours 12/28/20 1731 12/31/20 0918   12/28/20 1800  meropenem (MERREM) 1 g in sodium chloride 0.9 % 100 mL IVPB  Status:  Discontinued        1 g 200 mL/hr over 30 Minutes Intravenous Every 24 hours 12/28/20 1700 12/28/20 1731   12/28/20 1730  meropenem (MERREM) 1 g in sodium chloride 0.9 % 100 mL IVPB  Status:  Discontinued        1 g 200 mL/hr over 30 Minutes Intravenous Every 8 hours 12/28/20 1639 12/28/20 1700       Assessment/Plan: POD#21, 18, 15 S/P DEBRIDEMENT LEFT BUTTOCK NECROTIZING SOFT TISSUE INFECTION by Dr. Janee Morn, Dr. Derrell Lolling x2 - wound is clean and stable -ok for DC home from our standpoint -has follow up with the wound care center arranged  Below per CCM: Acute hypoxic respiratory failure, resolved Septic  shock Renal failure - per nephrology, CRRT stopped yesterday 6/14 per renal, now making urine DM2 Obesity A. Flutter with RVR - started on Eliquis 6/17   FEN: regular ID:  merrem 6/3-6/6, linezolid 6/3-6/8, Zosyn 6/6 >> 6/23 VTE: SCD's, Eliquis Foley: in place for strict I&O's, placed 6/3    LOS: 21 days    Letha Cape 01/18/2021

## 2021-01-18 NOTE — TOC Progression Note (Addendum)
Transition of Care [name]) - Progression Note    Patient Details  Name: [name] [name] MRN: [id number] Date of Birth: Dec 05, 1970  Transition of Care [name] CM/SW Contact  [name] [name], [name] Devon, RN Phone Number: 01/18/2021, 8:35 AM  Clinical Narrative:     [name] spoke with [name] [name] the wound care [name] in [place] has closed.   Will speak with family regarding which other location they would like to go to . Also regarding no home health   1114 Patient currently in procedure. Called sister [name] [name] and left voicemail   1215 Spoke to patients sister [name] [name] and son at bedside. Patient still in procedure.   Above explained to [name] [name] and son. They are aware patient "slid out of bed " last evening . Discussed looking into SNF. Family does not want SNF ( patient did not want SNF yesterday).   [name] [name] and son will discuss when patient returns to room. They will discuss rather patient will discharge to her home or sister's. Sister's home only has a couple steps to get in, patient's home has more steps. Patient will need [facility] home. [employer] has contacted [name] [name] regarding hospital bed, once address confirmed [name] [name] will call [employer] back to arrange delivery.   NCM has messaged PTA to work on steps today.   [name] [name] aware patient cannot go to wound care [name] daily, weekly etc.   [name] [name] will need to be taught wound carers wound care [name] at [facility] over [place]. NCM called and first available appointment is February 01, 2021 at 0900. Placed on AVS.   Will follow up with patient and family later today.  1420 Patient has decided to stay at her sister's home at discharge. Address: [address] [place], [place] [address]    Ordered walker and 3 in 1 , they would like that delivered to sister's home with hospital bed and air mattress. Alos ordered dressing supplies receive a list from [place] with PT . Patient will need to discharge with 3 days of supplies. Asked nurse if she could give [name] [name] the  supplies today because [facility] will not transport a lot of stuff. Also [name] [name] would like some more teaching , nurse aware  CAlled [name] [name] with [employer] [facility]     Expected Discharge Plan: Home w Home Health Services    Expected Discharge Plan and Services Expected Discharge Plan: Home w Home Health Services   Discharge Planning Services: CM Consult Post Acute Care Choice: Home Health                   DME Arranged: N/A         HH Arranged: RN, PT           Social Determinants of Health (SDOH) Interventions    Readmission Risk Interventions No flowsheet data found.

## 2021-01-18 NOTE — Progress Notes (Signed)
OT Cancellation Note  Patient Details Name: [name] [name] MRN: [id number] DOB: March 24, 1971   Cancelled Treatment:    Reason Eval/Treat Not Completed: Patient at procedure or test/ unavailable. Pt in OR, OT will check next available time as appropriate  Galen Manila 01/18/2021, 10:45 AM

## 2021-01-18 NOTE — Progress Notes (Signed)
PROGRESS NOTE   [name] [name]  QDI:[id number] DOB: 01-11-1971 DOA: 12/28/2020 PCP: Pcp, No  Brief Narrative:  28 white female HTN DM TY 2 reflux Recent Bartholin cyst surgery@[facility]  [place] 5/29-culture grew GBS  CT scan at outside hospital--showed SQ gas buttocks no drainable abscess -antibiotics adjusted- patient -->  6/3  en route - hypotensive with azotemia and significant leukocytosis with metabolic encephalopathy and admitted to the ICU-placed on pressors and IV fluids Found to have gluteal fold abscess and erythema and induration and necrotizing soft tissue infection of left buttock See itemized events as below  Hospital events as below  5/29 admitted to [place] sepsis felt 2/2 cellulitis on buttocks. Cultures sent. CT abd/pelvis + [place] gas but no drainable abscess. Started on Vanc. IVFs. Wound also cultured. 6/1 wound culture back + group B strep. 1 of 4 blood cultures + GPC. ABX changed to rocephin 6/3 Arrived at [facility] from [place] [facility] w/ new hypotension, worsening renal failure, worsening leukocytosis, poorly controlled pain and new encephalopathy. Admitted to ICU. Started on zyvox and meropenem. Surgical consult requested> to OR for debridement 6/3 started CRRT, meropenem and Linezolid added 6/4 IVIg >6/6 6/5 Meropenem stopped 6/6 zosyn added 6/6 back to OR for debridement, in and out of atrial flutter; QTc 590; cardiology consult> amiodarone 6/7 off vasopressors, continues to have atrial flutter, started ketamine, Linezolid stopped 6/8 started on metoprolol, echo with LVEF 45-50%. 6/9 OR debridement 6/11 Fever curve improved, remains on CRRT 6/12 extubated 6/13 remains on CRRT 6/14 CRRT stopped, continues to have pain and agitation issues 6/15 agitated, fell out of bed, reported that she hit her head, head CT neg 6/16 much better with psych med changes 6/17 transferred to [facility] 6/22 continues to improve 6/24 successful DCCV to sinus rhythm by  cardiology  Hospital-Problem based course  Septic shock on admission 2/2 polymicrobial necrotizing buttocks infection Antibiotic (Zosyn) stopped 6/23 (6/3-6/23) per Dr. 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 Duke Salvia successful Continues on Eliquis 5 twice daily Needs TOC visit in the outpatient setting Resolving ICU delirium Discontinue safety sitter Continue Klonopin 1 mg twice daily as needed oxycodone-discontinue IV meds Underlying depression anxiety Continue Cymbalta DR 60 daily, Seroquel cut back to 25 at bedtime, continuing sertraline 25 qd Cut back Ativan 2.5 every 4 --DC Haldol Will discharge home on low-dose Klonopin DM TY 2 on oral meds prior to admission with complication of hypoglycemia CBG are now slightly higher in the 90s to 110 range she is eating a little bit more Cut back Lantus to 5 units At home is on Amaryl so may just discharged on this Prior dysphagia Tolerating regular thin liquids since 6/20 previously had core track   DVT prophylaxis: Eliquis Code Status: Full Family Communication: None at bedside Disposition:  Status is: Inpatient  Remains inpatient appropriate because:Hemodynamically unstable, Ongoing diagnostic testing needed not appropriate for outpatient work up, and Unsafe d/c plan  Dispo: The patient is from: Home              Anticipated d/c is to:  Possibly home with home health and 24 hours  Patient currently is not medically stable to d/c.   Difficult to place patient No   Consultants:  Multiple  Procedures: Multiple debridements  Antimicrobials:      Subjective:  More coherent "I want to go home" Long discussion with her and her Sister [name] [name] at the bedside-explained to patient she cannot go home as yet-need to see if cardioversion sticks Also need to ensure hospital bed equipment etc. delivered Patient appears modified by this No chest pain I examined the wounds and they look pretty clean   Objective: Vitals:   01/18/21 1135 01/18/21 1145 01/18/21 1155 01/18/21 1423  BP: (!) 84/47 (!) 93/58 113/64 100/62  Pulse: 75 80 83 85  Resp: 15 16 (!) 25 16  Temp: 98.6 F (37 C)   97.7 F (36.5 C)  TempSrc: Oral   Axillary  SpO2: 97% 97% 98% 97%  Weight:      Height:        Intake/Output Summary (Last 24 hours) at 01/18/2021 1648 Last data filed at 01/18/2021 1500 Gross per 24 hour  Intake 451.64 ml  Output 550 ml  Net -98.36 ml    Filed Weights   01/07/21 0459 01/08/21 0400 01/08/21 0800  Weight: 99.2 kg 93.4 kg 93.4 kg    Examination: Less confused No icterus no pallor Chest clear no rales no rhonchi Wound exam as below      ata Reviewed: personally reviewed   CBC    Component Value Date/Time   WBC 5.9 01/16/2021 0023   RBC 2.49 (L) 01/16/2021 0735   RBC 2.37 (L) 01/16/2021 0023   HGB 7.5 (L) 01/16/2021 0023   HCT 23.7 (L) 01/16/2021 0023   PLT 275 01/16/2021 0023   MCV 100.0 01/16/2021 0023   MCH 31.6 01/16/2021 0023   MCHC 31.6 01/16/2021 0023   RDW 15.9 (H) 01/16/2021 0023   LYMPHSABS 2.2 01/03/2021 0638   MONOABS 1.3 (H) 01/03/2021 0638   EOSABS 0.1 01/03/2021 0638   BASOSABS 0.1 01/03/2021 0638   CMP Latest Ref Rng & Units 01/18/2021 01/17/2021 01/16/2021  Glucose 70 - 99 mg/dL 536(R) 86 443(X)  BUN 6 - 20 mg/dL 12 13 13   Creatinine 0.44 - 1.00 mg/dL ) 5.40(G) 8.67(Y)  Sodium 135 - 145 mmol/L 136 138 136  Potassium 3.5 - 5.1 mmol/L 3.6 5.0 3.4(L)  Chloride 98 - 111 mmol/L 101 107 104  CO2 22 - 32 mmol/L 26 21(L) 24  Calcium 8.9 - 10.3 mg/dL 1.95(K) 9.3(O) 8.3(L)  Total Protein  6.5 - 8.1 g/dL 6.5 7.2 -  Total Bilirubin 0.3 - 1.2 mg/dL 0.8 0.7 -  Alkaline Phos 38 - 126 U/L 154(H) 154(H) -  AST 15 - 41 U/L 22 34 -  ALT 0 - 44 U/L 15 16 -     Radiology Studies: No results found.   Scheduled Meds:  (feeding supplement) PROSource Plus  30 mL Oral TID BM   amiodarone  200 mg Oral BID   Followed by   6.7(T ON 01/31/2021] amiodarone  200 mg Oral Daily   apixaban  5 mg Oral BID   Chlorhexidine Gluconate Cloth  6 each Topical Q0600   clonazepam  1 mg Oral BID   DULoxetine  60 mg Oral Daily   feeding supplement  237 mL Oral TID PC & HS   insulin aspart  0-20 Units Subcutaneous TID WC   insulin aspart  3 Units Subcutaneous TID WC   insulin glargine  5 Units Subcutaneous BID   magnesium  oxide  400 mg Oral BID   mouth rinse  15 mL Mouth Rinse BID   multivitamin with minerals  1 tablet Oral Daily   pantoprazole  40 mg Oral Daily   QUEtiapine  25 mg Oral QHS   sertraline  25 mg Oral Daily   sodium chloride flush  10-40 mL Intracatheter Q12H   Continuous Infusions:  sodium chloride 10 mL/hr at 01/18/21 1055   sodium chloride 20 mL/hr at 01/18/21 1254     LOS: 21 days   Time spent: 27 minutes  Rhetta Mura, MD [facility] To contact the attending provider between 7A-7P or the covering provider during after hours 7P-7A, please log into the web site [email or web address] and access using universal [place] password for that web site. If you do not have the password, please call the hospital operator.  01/18/2021, 4:48 PM

## 2021-01-18 NOTE — Progress Notes (Signed)
PT Cancellation Note  Patient Details Name: Javen Ridings MRN: [id number] DOB: [birth year]   Cancelled Treatment:    Reason Eval/Treat Not Completed: (P) Patient at procedure or test/unavailable (Pt of unit for TEE.)   Kaeden Mester Artis Delay 01/18/2021, 10:57 AM  Bonney Leitz , PTA Acute Rehabilitation Services Pager [phone] Office [phone]

## 2021-01-18 NOTE — CV Procedure (Signed)
Brief TEE Note  LVEF 55-60% Mild TR.  Trivial MR. No LA/LAA thrombus or masses  Electrical Cardioversion Procedure Note [name] [name] [id number] 1971/03/19  Procedure: Electrical Cardioversion Indications:  Atrial Flutter  Procedure Details Consent: Risks of procedure as well as the alternatives and risks of each were explained to the (patient/caregiver).  Consent for procedure obtained. Time Out: Verified patient identification, verified procedure, site/side was marked, verified correct patient position, special equipment/implants available, medications/allergies/relevent history reviewed, required imaging and test results available.  Performed  Patient placed on cardiac monitor, pulse oximetry, supplemental oxygen as necessary.  Sedation given:  propofol Pacer pads placed anterior and posterior chest.  Cardioverted 1 time(s).  Cardioverted at 200J.  Evaluation Findings: Post procedure EKG shows: NSR Complications: None Patient did tolerate procedure well.   Chilton Si, MD 01/18/2021, 11:37 AM

## 2021-01-18 NOTE — Progress Notes (Addendum)
Physical Therapy Wound Treatment and Discharge Patient Details  Name: [name] [name] MRN: [id number] Date of Birth: 1971-04-08  Today's Date: 01/18/2021 Time: 4818-5631 Time Calculation (min): 45 min  Subjective  Subjective Assessment Subjective: somewhat more emotional with family present. Wanting her son to help her reposition, order her food, hold her hand, etc. Patient and Family Stated Goals: None stated Date of Onset:  (Unsure) Prior Treatments: I&D 6/9  Pain Score:  Pt was not premedicated this date and reports mild pain with debridement. Overall tolerated treatment well.   Wound Assessment  Wound / Incision (Open or Dehisced) 12/28/20 Buttocks Left cyst- reddened, swollen area with brown drainage coming from left lower buttocks (Active)  Wound Image   01/18/21 1432  Dressing Type ABD;Barrier Film (skin prep);Gauze (Comment);Moist to moist;Normal saline moist dressing 01/18/21 1432  Dressing Changed Changed 01/18/21 1432  Dressing Status Clean;Dry;Intact 01/18/21 1432  Dressing Change Frequency Daily 01/18/21 1432  Site / Wound Assessment Red;Yellow 01/18/21 1432  % Wound base Red or Granulating 80% 01/18/21 1432  % Wound base Yellow/Fibrinous Exudate 20% 01/18/21 1432  % Wound base Black/Eschar 0% 01/18/21 1432  % Wound base Other/Granulation Tissue (Comment) 0% 01/18/21 1432  Peri-wound Assessment Intact 01/18/21 1432  Wound Length (cm) 19 cm 01/15/21 1100  Wound Width (cm) 20 cm 01/15/21 1100  Wound Depth (cm) 4.5 cm 01/15/21 1100  Wound Volume (cm^3) 1710 cm^3 01/15/21 1100  Wound Surface Area (cm^2) 380 cm^2 01/15/21 1100  Tunneling (cm) 6 cm 6:00, 3.5 cm at 8:00 01/15/21 1100  Undermining (cm) up to 3 cm from 9:00 to 12:00 01/15/21 1250  Margins Unattached edges (unapproximated) 01/18/21 1432  Closure None 01/18/21 1432  Drainage Amount Minimal 01/18/21 1432  Drainage Description Serosanguineous 01/18/21 1432  Non-staged Wound Description Full thickness 01/18/21  1432  Treatment Debridement (Selective);Hydrotherapy (Pulse lavage);Packing (Saline gauze) 01/18/21 1432   Hydrotherapy Pulsed lavage therapy - wound location: L buttock Pulsed Lavage with Suction (psi): 12 psi Pulsed Lavage with Suction - Normal Saline Used: 1000 mL Pulsed Lavage Tip: Tip with splash shield Selective Debridement Selective Debridement - Location: L buttock Selective Debridement - Tools Used: Forceps, Scissors Selective Debridement - Tissue Removed: loose yellow slough    Wound Assessment and Plan  Wound Therapy - Assess/Plan/Recommendations Wound Therapy - Clinical Statement: [place] still present mainly on the right side of the wound 3:00-4:00. The rest of the wound bed is grossly clean. Assessed wound with Claiborne Billings, PA-C and hydrotherapy will sign off at this time. If needs change, please reconsult. Wound Therapy - Functional Problem List: Decreased tolerance for OOB to chair and position changes due to pain and decreased skin integrity. Factors Delaying/Impairing Wound Healing: Infection - systemic/local, Incontinence Hydrotherapy Plan: Debridement, Dressing change, Patient/family education, Pulsatile lavage with suction Wound Therapy - Frequency: 6X / week Wound Therapy - Follow Up Recommendations: dressing changes by RN  Wound Therapy Goals- Improve the function of patient's integumentary system by progressing the wound(s) through the phases of wound healing (inflammation - proliferation - remodeling) by: Wound Therapy Goals - Improve the function of patient's integumentary system by progressing the wound(s) through the phases of wound healing by: Decrease Necrotic Tissue to: 0% Decrease Necrotic Tissue - Progress: Progressing toward goal Increase Granulation Tissue to: 100% Increase Granulation Tissue - Progress: Progressing toward goal Goals/treatment plan/discharge plan were made with and agreed upon by patient/family: Yes Time For Goal Achievement: 7 days Wound  Therapy - Potential for Goals: Good  Goals will be updated until maximal potential  achieved or discharge criteria met.  Discharge criteria: when goals achieved, discharge from hospital, MD decision/surgical intervention, no progress towards goals, refusal/missing three consecutive treatments without notification or medical reason.  GP     Charges PT Wound Care Charges $Wound Debridement up to 20 cm: < or equal to 20 cm $ Wound Debridement each add'l 20 sqcm: 3 $PT PLS Gun and Tip: 1 Supply $PT Hydrotherapy Visit: 1 Visit       Thelma Comp 01/18/2021, 2:51 PM  Rolinda Roan, PT, DPT Acute Rehabilitation Services Pager: [phone] Office: [phone]

## 2021-01-18 NOTE — Progress Notes (Signed)
I let patient and patients sister know that Heather case manager has ordered supplies with [employer]. Patients sister given 3 days worth of supplies and she took them home with her just now as she was leaving. Gave patients sister walk through teaching of how to change patients dressing. Sister verbalized understanding of teaching and stated that she would be able to do dressing changes at home.

## 2021-01-18 NOTE — Anesthesia Postprocedure Evaluation (Signed)
Anesthesia Post Note  Patient: [name] [name]  Procedure(s) Performed: TRANSESOPHAGEAL ECHOCARDIOGRAM (TEE) CARDIOVERSION     Patient location during evaluation: Endoscopy Anesthesia Type: MAC Level of consciousness: awake and alert Pain management: pain level controlled Vital Signs Assessment: post-procedure vital signs reviewed and stable Respiratory status: spontaneous breathing, nonlabored ventilation, respiratory function stable and patient connected to nasal cannula oxygen Cardiovascular status: stable and blood pressure returned to baseline Postop Assessment: no apparent nausea or vomiting Anesthetic complications: no   No notable events documented.  Last Vitals:  Vitals:   01/18/21 1145 01/18/21 1155  BP: (!) 93/58 113/64  Pulse: 80 83  Resp: 16 (!) 25  Temp:    SpO2: 97% 98%    Last Pain:  Vitals:   01/18/21 1155  TempSrc:   PainSc: 0-No pain                 Shephanie Romas COKER

## 2021-01-18 NOTE — Anesthesia Preprocedure Evaluation (Addendum)
Anesthesia Evaluation  Patient identified by MRN, date of birth, ID band Patient awake    Reviewed: Allergy & Precautions, NPO status , Patient's Chart, lab work & pertinent test results  Airway Mallampati: II  TM Distance: >3 FB     Dental  (+) Edentulous Upper, Edentulous Lower   Pulmonary Current Smoker,    breath sounds clear to auscultation       Cardiovascular  Rhythm:Irregular Rate:Normal     Neuro/Psych    GI/Hepatic   Endo/Other  diabetes  Renal/GU      Musculoskeletal   Abdominal   Peds  Hematology   Anesthesia Other Findings   Reproductive/Obstetrics                            Anesthesia Physical Anesthesia Plan  ASA: 3  Anesthesia Plan: MAC and General   Post-op Pain Management:    Induction:   PONV Risk Score and Plan: Ondansetron and Propofol infusion  Airway Management Planned: Natural Airway, Simple Face Mask and Mask  Additional Equipment:   Intra-op Plan:   Post-operative Plan:   Informed Consent: I have reviewed the patients History and Physical, chart, labs and discussed the procedure including the risks, benefits and alternatives for the proposed anesthesia with the patient or authorized representative who has indicated his/her understanding and acceptance.       Plan Discussed with: CRNA and Anesthesiologist  Anesthesia Plan Comments:         Anesthesia Quick Evaluation

## 2021-01-18 NOTE — Progress Notes (Addendum)
Calorie Count Note  48 hour calorie count ordered.  Diet: regular Supplements: Ensure Enlive po TID, each supplement provides 350 kcal and 20 grams of protein; Magic cup BID with meals, each supplement provides 290 kcal and 9 grams of protein; 30 ml Prosource plus BID, each supplement provides 100 kcals and 15 grams protein  Pt down for procedure; has been NPO for TEE.   6/22 Breakfast: 351 kcals, 15 grams protein Lunch: 58 kcals, 3 grams protein Dinner: 307 kcals, 21 grams protein Supplements: 1 Ensure Enlive (350 kcals, 20 grams protein); 2 Prosource Plus (200 kcals, 30 grams protein)   Total intake: 1266 kcal (58% of minimum estimated needs)  89 grams protein (68% of minimum estimated needs)  6/23 Breakfast: 0% completed (due to NPO status) Lunch: 92 kcals, 1 gram protein Dinner: 0% Supplements: 3 Ensure Enlive (1050 kcals, 60 grams protein); 2 Prosource Plus (200 kcals, 30 grams protein)  Total intake: 1342 kcal (61% of minimum estimated needs)  91 grams protein (70% of minimum estimated needs)  Nutrition Dx:   Increased nutrient needs related to wound healing, acute illness (L buttock necrotizing fasciitis) as evidenced by estimated needs; ongoing (being addressed with supplements and calorie count)   Goal: Patient will meet greater than or equal to 90% of their needs; progressing   Intervention:   -D/c calorie count -Continue liberalized diet of regular -Increase Ensure Enlive po  to QID, each supplement provides 350 kcal and 20 grams of protein  -Increase Magic cup to TID with meals, each supplement provides 290 kcal and 9 grams of protein  -Increase 30 ml Prosource Plus to TID, each supplement provides 100 kcals and 15 grams protein  Levada Schilling, RD, LDN, CDCES Registered Dietitian II Certified Diabetes Care and Education Specialist Please refer to [facility] for RD and/or RD on-call/weekend/after hours pager

## 2021-01-18 NOTE — Discharge Instructions (Signed)

## 2021-01-18 NOTE — Progress Notes (Addendum)
Physical Therapy Treatment Patient Details Name: [name] [name] MRN: [id number] DOB: Dec 24, 1970 Today's Date: 01/18/2021    History of Present Illness 50 y.o. female presenting from [facility] on 6/3 with encephalopathy, polymicrobial necrotizing skin and soft tissue infection of the left buttock/posterior thigh with TSS s/p debridement 6/3 and 6/6, septic shock, heart failure possibly A-fib with RVR induced and AKI. CCRT 6/3-6/14; extubated 6/12; fall out of bed 6/15 with (-) head CT. PMHx significant for DMII, anxiety and new onset A-fib.    PT Comments    Pt in long sitting in bed on arrival.  She required max cues to participate in PT session this pm. Focused on stair training as she will be d/c home to her sister's.  Son and sister present and participated in PT session during gt and stair training.  Issued gt belt for home use.  Continue to recommend rehab in a post acute setting but patient refusing placement and LTACH has denied so will require HHPT at d/c.   Will inform supervising PT of need for change in recommendations.    Follow Up Recommendations  SNF (will need HHPT as she is refusing to go to SNF for rehab.)     Equipment Recommendations  Rolling walker with 5" wheels;3in1 (PT);Hospital bed    Recommendations for Other Services       Precautions / Restrictions Precautions Precautions: Fall Precaution Comments: High fall risk, huge left buttock wound Restrictions Weight Bearing Restrictions: No    Mobility  Bed Mobility Overal bed mobility: Needs Assistance Bed Mobility: Supine to Sit;Sit to Supine     Supine to sit: Supervision Sit to supine: Supervision   General bed mobility comments: Pt able to perform with max cues for encouragement she continues to request assistance to mobilize.    Transfers Overall transfer level: Needs assistance Equipment used: Rolling walker (2 wheeled) Transfers: Sit to/from Stand Sit to Stand: Min assist Stand pivot  transfers: Min assist       General transfer comment: Min assistance to move into standing. She continues to be impulsive and required assistance from PTA or son.  PTA provided education to son for use of gt belt and safety with hand placement during transfer.  Ambulation/Gait Ambulation/Gait assistance: Min assist Gait Distance (Feet): 10 Feet (x2 with HHA, 15 ft with RW) Assistive device: Rolling walker (2 wheeled);1 person hand held assist Gait Pattern/deviations: Step-through pattern;Decreased stride length;Drifts right/left;Trunk flexed     General Gait Details: Pt with poor obstacle negotiation and balance.  Frequent cues to step closer to RW for balance and safety.  Pt had a tendency to remove hands from RW this session.  Cues to keep hand on device.  Son performed hands on assistance during session.   Stairs             Wheelchair Mobility    Modified Rankin (Stroke Patients Only)       Balance Overall balance assessment: Needs assistance Sitting-balance support: Bilateral upper extremity supported;Feet unsupported Sitting balance-Leahy Scale: Fair     Standing balance support: Bilateral upper extremity supported;During functional activity Standing balance-Leahy Scale: Poor Standing balance comment: Reliant on external assist and bil UE support  to maintain static standing balance.                            Cognition Arousal/Alertness: Awake/alert Behavior During Therapy: Impulsive Overall Cognitive Status: Impaired/Different from baseline Area of Impairment: Following commands;Safety/judgement;Awareness;Problem solving  Following Commands: Follows one step commands consistently;Follows multi-step commands with increased time Safety/Judgement: Decreased awareness of safety Awareness: Emergent Problem Solving: Slow processing General Comments: Patient impulsive. Mod to max cues for safety. Decr safety as she  fatigues      Exercises      General Comments        Pertinent Vitals/Pain Pain Assessment: Faces Faces Pain Scale: Hurts little more Pain Location: when sitting due to pressure. Pain Descriptors / Indicators: Sore Pain Intervention(s): Monitored during session;Repositioned    Home Living                      Prior Function            PT Goals (current goals can now be found in the care plan section) Acute Rehab PT Goals Patient Stated Goal: To get better. Potential to Achieve Goals: Fair Progress towards PT goals: Progressing toward goals    Frequency    Min 3X/week      PT Plan Discharge plan needs to be updated    Co-evaluation              AM-PAC PT "6 Clicks" Mobility   Outcome Measure  Help needed turning from your back to your side while in a flat bed without using bedrails?: A Little Help needed moving from lying on your back to sitting on the side of a flat bed without using bedrails?: A Little Help needed moving to and from a bed to a chair (including a wheelchair)?: A Little Help needed standing up from a chair using your arms (e.g., wheelchair or bedside chair)?: A Little Help needed to walk in hospital room?: A Little Help needed climbing 3-5 steps with a railing? : A Lot 6 Click Score: 17    End of Session Equipment Utilized During Treatment: Gait belt Activity Tolerance: Patient tolerated treatment well;Patient limited by fatigue Patient left: in bed;with call bell/phone within reach;with bed alarm set Nurse Communication: Mobility status PT Visit Diagnosis: Unsteadiness on feet (R26.81);Muscle weakness (generalized) (M62.81)     Time: 4235-3614 PT Time Calculation (min) (ACUTE ONLY): 29 min  Charges:  $Gait Training: 8-22 mins $Therapeutic Activity: 8-22 mins                     Bonney Leitz , PTA Acute Rehabilitation Services Pager [phone] Office [phone]    [name] [name] 01/18/2021, 2:12 PM

## 2021-01-18 NOTE — Progress Notes (Signed)
  Echocardiogram Echocardiogram Transesophageal has been performed.  Delcie Roch 01/18/2021, 11:54 AM

## 2021-01-18 NOTE — Progress Notes (Signed)
   01/18/21 1808  Ambulation/Gait  Stairs Yes  Stairs assistance Mod assist  Stair Management One rail Right  Number of Stairs 4  General stair comments x2 with PTA and x2 with her son.  Required assistance to climb stairs.  Late entry from PT session earlier.

## 2021-01-18 NOTE — H&P (View-Only) (Signed)
Progress Note  Patient Name: [name] [name] Date of Encounter: 01/18/2021  [facility] Cardiologist: Chilton Si, MD   Subjective   Denies any chest pain, SOB or palpitations.  Nervous about the TEE/DCCV  Inpatient Medications    Scheduled Meds:  (feeding supplement) PROSource Plus  30 mL Oral BID WC   amiodarone  200 mg Oral BID   Followed by   [name] 01/31/2021] amiodarone  200 mg Oral Daily   apixaban  5 mg Oral BID   Chlorhexidine Gluconate Cloth  6 each Topical Q0600   clonazepam  1 mg Oral BID   DULoxetine  60 mg Oral Daily   feeding supplement  237 mL Oral TID BM   insulin aspart  0-20 Units Subcutaneous TID WC   insulin aspart  3 Units Subcutaneous TID WC   insulin glargine  5 Units Subcutaneous BID   magnesium oxide  400 mg Oral BID   mouth rinse  15 mL Mouth Rinse BID   multivitamin with minerals  1 tablet Oral Daily   pantoprazole  40 mg Oral Daily   QUEtiapine  25 mg Oral QHS   sertraline  25 mg Oral Daily   sodium chloride flush  10-40 mL Intracatheter Q12H   Continuous Infusions:  sodium chloride 250 mL (01/07/21 1223)   PRN Meds: food thickener, haloperidol lactate, levalbuterol, LORazepam, ondansetron (ZOFRAN) IV, oxyCODONE, polyethylene glycol, sodium chloride flush   Vital Signs    Vitals:   01/17/21 2002 01/17/21 2352 01/18/21 0416 01/18/21 0958  BP: 106/70 (!) 106/58 109/62 109/62  Pulse: (!) 110 (!) 102 (!) 106 (!) 113  Resp: 18 18 18 16   Temp: 98.4 F (36.9 C) 98.8 F (37.1 C) 98.1 F (36.7 C) 98.1 F (36.7 C)  TempSrc: Oral Oral Oral Oral  SpO2: 97% 94% 96% 99%  Weight:      Height:        Intake/Output Summary (Last 24 hours) at 01/18/2021 1007 Last data filed at 01/18/2021 0842 Gross per 24 hour  Intake 970 ml  Output 1380 ml  Net -410 ml   Last 3 Weights 01/08/2021 01/08/2021 01/07/2021  Weight (lbs) 205 lb 14.6 oz 205 lb 14.6 oz 218 lb 11.1 oz  Weight (kg) 93.4 kg 93.4 kg 99.2 kg      Telemetry    Atrial  fibrillation with RVR in the low 100's Personally Reviewed  ECG    N/a  Physical Exam   GEN: No acute distress.   Neck: No JVD Cardiac: irregularly irregular, no murmurs, rubs, or gallops.  Respiratory: Clear to auscultation bilaterally. GI: Soft, nontender, non-distended  MS: No edema; No deformity. Neuro:  Nonfocal  Psych: Normal affect   Labs    High Sensitivity Troponin:   Recent Labs  Lab 12/30/20 1023  TROPONINIHS 88*      Chemistry Recent Labs  Lab 01/16/21 0023 01/17/21 1117 01/18/21 0209  NA 136 138 136  K 3.4* 5.0 3.6  CL 104 107 101  CO2 24 21* 26  GLUCOSE 126* 86 131*  BUN 13 13 12   CREATININE 1.05* 1.11* 1.06*  CALCIUM 8.3* 8.6* 8.5*  PROT  --  7.2 6.5  ALBUMIN  --  2.2* 2.0*  AST  --  34 22  ALT  --  16 15  ALKPHOS  --  154* 154*  BILITOT  --  0.7 0.8  GFRNONAA >60 >60 >60  ANIONGAP 8 10 9      Hematology Recent Labs  Lab  01/14/21 0842 01/15/21 0217 01/16/21 0023 01/16/21 0735  WBC 4.5 4.0 5.9  --   RBC 2.44* 2.30* 2.37* 2.49*  HGB 7.9* 7.5* 7.5*  --   HCT 24.9* 23.3* 23.7*  --   MCV 102.0* 101.3* 100.0  --   MCH 32.4 32.6 31.6  --   MCHC 31.7 32.2 31.6  --   RDW 16.3* 16.0* 15.9*  --   PLT 330 277 275  --     Radiology    No results found.  Cardiac Studies   Echocardiogram 01/02/2021: Impressions:  1. Left ventricular ejection fraction, by estimation, is 45 to 50%. The  left ventricle has mildly decreased function. The left ventricle  demonstrates global hypokinesis. Left ventricular diastolic parameters  were normal.   2. Right ventricular systolic function is normal. The right ventricular  size is normal. There is normal pulmonary artery systolic pressure.   3. The mitral valve is normal in structure. Mild mitral valve  regurgitation. No evidence of mitral stenosis.   4. The aortic valve is normal in structure. Aortic valve regurgitation is  not visualized. No aortic stenosis is present.   5. The inferior vena cava is  normal in size with greater than 50%  respiratory variability, suggesting right atrial pressure of 3 mmHg.  Patient Profile     50 y.o. female with a history of hypertension, hyperlipidemia, diabetes, GERD, and morbid obesity who was presented to [facility] on 12/23/2020 with septic shock secondary to necrotizing soft tissue infection. She was transferred to [facility] on 12/28/2020. Initially required multiple pressors. S/p I&D on 6/6/ and 6/9.  Hospitalization complicated by acute respiratory failure requiring intubation (extubated on 6/12), acute oliguric renal failure requiring CRRT (stopped 6/14), encephalopathy with ICU delirium. She initially required multiple pressors. Underwen tCardiology initially consulted on 12/31/2020 for atrial flutter with RVR and prolonged QTc. We initially signed off on 01/08/2021. Patient's condition has since improved and we have been asked to see again to rediscuss possible DCCV  Assessment & Plan   New Onset Atrial Fibrillation/Flutter - In the setting of critical illness and sepsis. - Currently in atrial fibrillation with rates consistently in the low 100s despite loaded with amiodarone.  - Plan for TEE/DCCV today  - Continue Amio 200mg  twice daily x2 weeks and then transition to 200mg  daily. - CHA2DS2-VASc = 4 (CHF, HTN, DM, female).  - Continue Eliquis 5mg  twice daily.  Acute Combined CHF - BNP 1,323 on admission. - Echo showed LVEF of 45-50% with global hypokinesis possibly tachy related - Volume initially managed with CRRT but this was stopped on 6/14. Creatinine improved to 1.05. - she dose not appear volume overloaded on exam today - Not currently on any GDMT due to soft BP.   -BP may improve some with restoration of sinus rhythm.  Will reassess outpt - Slightly reduced EF likely due to acute illness/sepsis and atrial flutter with RVR. However, patient does have multiple cardiovascular risk factors including HTN, HLD, DM, and smoking history.  -  will repeat 2D echo in 2 months after restoration of NSR and if EF still reduced then will consider ischemic workup for coronary CTA   Hypertension - BP improved and ranging from 109-138/62-55mmHg   Hyperlipidemia -01/18/2021: Cholesterol 107; HDL 25; LDL Cholesterol 63; Triglycerides 93; VLDL 19   Oliguric Renal Failure - resolved   For questions or updates, please contact [facility] Please consult [email or web address] for contact info under        Signed, Ulysse Siemen  Mayford Knife, MD [facility] 01/18/2021, 10:07 AM

## 2021-01-19 LAB — GLUCOSE, CAPILLARY
Glucose-Capillary: 109 mg/dL — ABNORMAL HIGH (ref 70–99)
Glucose-Capillary: 111 mg/dL — ABNORMAL HIGH (ref 70–99)
Glucose-Capillary: 138 mg/dL — ABNORMAL HIGH (ref 70–99)
Glucose-Capillary: 162 mg/dL — ABNORMAL HIGH (ref 70–99)

## 2021-01-19 NOTE — Progress Notes (Signed)
Occupational Therapy Treatment Patient Details Name: Ruchama Kubicek MRN: [id number] DOB: 07/11/1971 Today's Date: 01/19/2021    History of present illness 50 y.o. female presenting from [facility] on 6/3 with encephalopathy, polymicrobial necrotizing skin and soft tissue infection of the left buttock/posterior thigh with TSS s/p debridement 6/3 and 6/6, septic shock, heart failure possibly A-fib with RVR induced and AKI. CCRT 6/3-6/14; extubated 6/12; fall out of bed 6/15 with (-) head CT. PMHx significant for DMII, anxiety and new onset A-fib.   OT comments  Pt progressing well with OT goals this session. Pt completing bed mobility and functional mobility with min guard - min A for safety and assistance steadying. Pt able to tolerate longer amounts of time standing and completing tasks. Safety awareness and impulsivity are still of concern, requiring pt to need someone close to her with all OOB tasks. Acute OT will continue to follow up and address pt goals.    Follow Up Recommendations  Home health OT    Equipment Recommendations  3 in 1 bedside commode;Other (comment) (RW)    Recommendations for Other Services      Precautions / Restrictions Precautions Precautions: Fall Precaution Comments: High fall risk, huge left buttock wound Restrictions Weight Bearing Restrictions: No       Mobility Bed Mobility Overal bed mobility: Needs Assistance Bed Mobility: Supine to Sit     Supine to sit: Min guard Sit to supine: Supervision   General bed mobility comments: Min guard for safety due to pain and pt being on an air bed.    Transfers Overall transfer level: Needs assistance Equipment used: Rolling walker (2 wheeled) Transfers: Sit to/from Stand Sit to Stand: Min guard         General transfer comment: Cues for hand placement to and from seated surface.  Impulsive to stand with poor safety awareness. Pt attempting not to use RW when ambulating initially.     Balance Overall balance assessment: Needs assistance Sitting-balance support: Bilateral upper extremity supported;Feet unsupported Sitting balance-Leahy Scale: Fair Sitting balance - Comments: EOB pt did not need UE support, but needed guard assist for safety due to impulsivitiy.   Standing balance support: Bilateral upper extremity supported;During functional activity Standing balance-Leahy Scale: Poor Standing balance comment: Reliant on external assist and bil UE support  to maintain static standing balance.                           ADL either performed or assessed with clinical judgement   ADL Overall ADL's : Needs assistance/impaired     Grooming: Wash/dry hands;Wash/dry face;Oral care;Standing Grooming Details (indicate cue type and reason): Pt completed 3 at sink with BSC behind her.             Lower Body Dressing: Minimal assistance;Sitting/lateral leans;Sit to/from stand Lower Body Dressing Details (indicate cue type and reason): Pt donned underwear at EOB with assist to get over L foot and steady pt when standing to pull them up. Toilet Transfer: Minimal assistance;Ambulation Toilet Transfer Details (indicate cue type and reason): Bed to bathroom sink, BSC behind her. Toileting- Clothing Manipulation and Hygiene: Minimal assistance;Sitting/lateral lean;Sit to/from stand Toileting - Clothing Manipulation Details (indicate cue type and reason): simulated when pulling up underwear at EOB     Functional mobility during ADLs: Minimal assistance;Rolling walker General ADL Comments: Pt very impulsive this session, attempting to walk without RW, sitting before fully reaching chair and BSC. Pt requiring min A to steady  when standing to complete ADL's in standing.     Vision       Perception     Praxis      Cognition Arousal/Alertness: Awake/alert Behavior During Therapy: Impulsive Overall Cognitive Status: Impaired/Different from baseline                                  General Comments: Pt impulsive.        Exercises     Shoulder Instructions       General Comments VSS on RA    Pertinent Vitals/ Pain       Pain Assessment: Faces Pain Score: 4  Faces Pain Scale: Hurts little more Pain Location: when sitting due to pressure. Pain Descriptors / Indicators: Discomfort Pain Intervention(s): Monitored during session;Repositioned  Home Living                                          Prior Functioning/Environment              Frequency  Min 2X/week        Progress Toward Goals  OT Goals(current goals can now be found in the care plan section)  Progress towards OT goals: Progressing toward goals  Acute Rehab OT Goals Patient Stated Goal: To get better. OT Goal Formulation: With patient Time For Goal Achievement: 01/24/21 Potential to Achieve Goals: Good ADL Goals Pt Will Perform Grooming: with supervision;standing Pt Will Perform Upper Body Dressing: with set-up;sitting Pt Will Perform Lower Body Dressing: with set-up;sit to/from stand Pt Will Transfer to Toilet: with supervision;ambulating Pt Will Perform Toileting - Clothing Manipulation and hygiene: with supervision;sit to/from stand Additional ADL Goal #1: Patient will score less than 5/28 on SBT indicating improved cognition.  Plan Discharge plan remains appropriate    Co-evaluation                 AM-PAC OT "6 Clicks" Daily Activity     Outcome Measure   Help from another person eating meals?: None Help from another person taking care of personal grooming?: A Little Help from another person toileting, which includes using toliet, bedpan, or urinal?: A Little Help from another person bathing (including washing, rinsing, drying)?: A Little Help from another person to put on and taking off regular upper body clothing?: A Little Help from another person to put on and taking off regular lower body clothing?: A  Little 6 Click Score: 19    End of Session Equipment Utilized During Treatment: Gait belt;Rolling walker  OT Visit Diagnosis: Unsteadiness on feet (R26.81);Muscle weakness (generalized) (M62.81);History of falling (Z91.81);Other symptoms and signs involving cognitive function   Activity Tolerance Patient tolerated treatment well   Patient Left in chair;with call bell/phone within reach   Nurse Communication Mobility status        Time: 5400-8676 OT Time Calculation (min): 31 min  Charges: OT General Charges $OT Visit: 1 Visit OT Treatments $Self Care/Home Management : 23-37 mins  Tarahji Ramthun H., OTR/L Acute Rehabilitation   Cicily Bonano Elane Bing Plume 01/19/2021, 12:44 PM

## 2021-01-19 NOTE — Progress Notes (Signed)
Patient will go home with foley per MD order

## 2021-01-19 NOTE — TOC Transition Note (Signed)
Transition of Care [facility]) - CM/SW Discharge Note   Patient Details  Name: [name] [name] MRN: [id number] Date of Birth: 25-Mar-1971  Transition of Care [facility]) CM/SW Contact:  Bess Kinds, RN Phone Number: [phone] 01/19/2021, 8:58 AM   Clinical Narrative:     Patient to transition to her sister's home at [address], [place], [place] [address] today. Spoke with patient's sister, Donnella Sham, on her mobile phone - she has not received call from [employer] concerning delivery. Spoke with [employer] - delivery is pending today. Ladonna made aware to anticipate call from [employer] this morning. Tub transfer bench added to order. Will arrange ambulance transport pending MD discharge.    Final next level of care: Home/Self Care Barriers to Discharge: No Barriers Identified   Patient Goals and CMS Choice Patient states their goals for this hospitalization and ongoing recovery are:: return to sister's home at [address], [place], [place] [address] CMS Medicare.gov Compare Post Acute Care list provided to:: Patient Choice offered to / list presented to : Patient  Discharge Placement                       Discharge Plan and Services   Discharge Planning Services: CM Consult Post Acute Care Choice: Home Health          DME Arranged: Hospital bed, 3-N-1, Tub bench, Walker rolling DME Agency: [employer] Date DME Agency Contacted: 01/19/21 Time DME Agency Contacted: [phone] Representative spoke with at DME Agency: Leavy Cella HH Arranged: RN, PT          Social Determinants of Health (SDOH) Interventions     Readmission Risk Interventions No flowsheet data found.

## 2021-01-19 NOTE — Progress Notes (Signed)
Physical Therapy Treatment Patient Details Name: [name] [name] MRN: [id number] DOB: Jun 29, 1971 Today's Date: 01/19/2021    History of Present Illness 50 y.o. 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 CCRT 6/3-6/14; extubated 6/12; fall out of bed 6/15 with (-) head CT. PMHx significant for DMII, anxiety and new onset A-fib.    PT Comments    Pt supine in bed on arrival and required encouragement to participate in OOB mobility this session.  She remains impulsive with poor safety and remains a fall risk. Gt belt issued to her family yesterday as well as education for guarding patient.  This session was limited due to bowel incontinence in standing.  Will continue to recommend short term rehab in a post acute setting, however pt is refusing this and will d/c home today with HHPT and support from her sister and her 68 yo son.      Follow Up Recommendations  SNF (refusing snf will need HHPT)     Equipment Recommendations  Rolling walker with 5" wheels;3in1 (PT);Hospital bed    Recommendations for Other Services       Precautions / Restrictions Precautions Precautions: Fall Precaution Comments: High fall risk, huge left buttock wound Restrictions Weight Bearing Restrictions: No    Mobility  Bed Mobility Overal bed mobility: Needs Assistance Bed Mobility: Supine to Sit;Sit to Supine     Supine to sit: Min assist Sit to supine: Supervision   General bed mobility comments: Supervision for safety to return to bed.  Min assistance to pull on PTA as railing to rise into a seated position.    Transfers Overall transfer level: Needs assistance Equipment used: Rolling walker (2 wheeled) Transfers: Sit to/from Stand Sit to Stand: Min guard         General  transfer comment: Cues for hand placement to and from seated surface.  Impulsive to stand with poor safety awareness.  Ambulation/Gait Ambulation/Gait assistance: Min assist Gait Distance (Feet): 10 Feet (x2) Assistive device: Rolling walker (2 wheeled) Gait Pattern/deviations: Wide base of support;Shuffle;Decreased stride length;Trunk flexed     General Gait Details: Pt with flexed posture and waddling BOS.  Upon standing presents with bowel incontinence.  Pt continues to have poor safety awareness with use of RW and pushes device to far from her with cues to keep device closer to her person.  Unable to progress gt distance due to bowel incontinence.   Stairs             Wheelchair Mobility    Modified Rankin (Stroke Patients Only)       Balance Overall balance assessment: Needs assistance Sitting-balance support: Bilateral upper extremity supported;Feet unsupported Sitting balance-Leahy Scale: Fair       Standing balance-Leahy Scale: Poor Standing balance comment: Reliant on external assist and bil UE support  to maintain static standing balance.                            Cognition Arousal/Alertness: Awake/alert Behavior During Therapy: Impulsive Overall Cognitive Status: Impaired/Different from baseline                                 General Comments: Patient impulsive. Mod to max cues for safety. Decr  safety as she fatigues- Bowel incontinence noted this session.      Exercises      General Comments        Pertinent Vitals/Pain Pain Assessment: Faces Pain Score: 4  Pain Location: when sitting due to pressure. Pain Descriptors / Indicators: Discomfort Pain Intervention(s): Monitored during session;Repositioned    Home Living                      Prior Function            PT Goals (current goals can now be found in the care plan section) Acute Rehab PT Goals Patient Stated Goal: To get better. Potential to  Achieve Goals: Fair Progress towards PT goals: Progressing toward goals    Frequency    Min 3X/week      PT Plan Current plan remains appropriate    Co-evaluation              AM-PAC PT "6 Clicks" Mobility   Outcome Measure  Help needed turning from your back to your side while in a flat bed without using bedrails?: A Little Help needed moving from lying on your back to sitting on the side of a flat bed without using bedrails?: A Little Help needed moving to and from a bed to a chair (including a wheelchair)?: A Little Help needed standing up from a chair using your arms (e.g., wheelchair or bedside chair)?: A Little Help needed to walk in hospital room?: A Little Help needed climbing 3-5 steps with a railing? : A Lot 6 Click Score: 17    End of Session Equipment Utilized During Treatment: Gait belt Activity Tolerance: Patient tolerated treatment well;Patient limited by fatigue Patient left: in bed;with call bell/phone within reach (nursing with bed elevated re-applying her dressing to her bottom.) Nurse Communication: Mobility status PT Visit Diagnosis: Unsteadiness on feet (R26.81);Muscle weakness (generalized) (M62.81)     Time: 0623-7628 PT Time Calculation (min) (ACUTE ONLY): 29 min  Charges:  $Gait Training: 8-22 mins $Therapeutic Activity: 8-22 mins                     [name] [name] , PTA Acute Rehabilitation Services Pager [phone] Office [phone]    [name] [name] Artis Delay 01/19/2021, 9:45 AM

## 2021-01-19 NOTE — Progress Notes (Signed)
Patient discharged to home Via ambulance with all belongings. Dressing changed and IV removed before discharge.

## 2021-01-19 NOTE — Discharge Summary (Signed)
Physician Discharge Summary  [name] [name] PYP:[id number] DOB: [birth year] DOA: 12/28/2020  PCP: Harvie Junior, MD  Admit date: 12/28/2020 Discharge date: 01/19/2021 37 minutes Time spent: 37 minutes  Recommendations for Outpatient Follow-up:  Will need outpatient follow-up with labs CBC, Chem-12 in 1 week at PCP office Needs wound care with local wound care center July 8 which is already been set up-until that time recommend wet-to-dry dressings Needs outpatient coordination of Foley catheter removal in [place] through primary care physician Please check periodic TSH LFT and note dosage changes of amiodarone going forward to be downward adjusted-TOC cardiology visit to be set up in the outpatient setting  Recommend de-escalation off of various psychotropic meds-Limited prescription of pain meds given on discharge  Discharge Diagnoses:  MAIN problem for hospitalization   Severe septic shock on admission New onset atrial fibrillation Severe wound in lower back status post multiple debridements now needing wound care 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 place  Please see below for itemized issues addressed in HOpsital- refer to other progress notes for clarity if needed  Discharge Condition: Fair  Diet recommendation: Diabetic  Filed Weights   01/07/21 0459 01/08/21 0400 01/08/21 0800  Weight: 99.2 kg 93.4 kg 93.4 kg    History of present illness:  83 white female HTN DM TY 2 reflux Recent Bartholin cyst surgery_0  [place] 5/29-culture grew GBS CT scan at outside hospital--showed SQ gas buttocks no drainable abscess -antibiotics adjusted- patient --> [place] 6/3 en route - hypotensive with azotemia and significant leukocytosis with metabolic encephalopathy and admitted to the ICU-placed on pressors and IV fluids Found to have gluteal fold abscess and erythema and induration and necrotizing soft tissue infection of left buttock See itemized events as  below   Hospital events as below   5/29 admitted to [place] sepsis felt 2/2 cellulitis on buttocks. Cultures sent. CT abd/pelvis + [place] gas but no drainable abscess. Started on Vanc. IVFs. Wound also cultured. 6/1 wound culture back + group B strep. 1 of 4 blood cultures + GPC. ABX changed to rocephin 6/3 Arrived at [facility] from [place] w/ new hypotension, worsening renal failure, worsening leukocytosis, poorly controlled pain and new encephalopathy. Admitted to ICU. Started on zyvox and meropenem. Surgical consult requested> to OR for debridement 6/3 started CRRT, meropenem and Linezolid added 6/4 IVIg >6/6 6/5 Meropenem stopped 6/6 zosyn added 6/6 back to OR for debridement, in and out of atrial flutter; QTc 590; cardiology consult> amiodarone 6/7 off vasopressors, continues to have atrial flutter, started ketamine, Linezolid stopped 6/8 started on metoprolol, echo with LVEF 45-50%. 6/9 OR debridement 6/11 Fever curve improved, remains on CRRT 6/12 extubated 6/13 remains on CRRT 6/14 CRRT stopped, continues to have pain and agitation issues 6/15 agitated, fell out of bed, reported that she hit her head, head CT neg 6/16 much better with psych med changes 6/17 transferred to [facility] 6/22 continues to improve 6/24 successful DCCV to sinus rhythm by cardiology    Hospital Course:  Septic shock on admission 2/2 polymicrobial necrotizing buttocks infection Antibiotic (Zosyn) stopped 6/23 (6/3-6/23) per Dr. Juleen China ID  Continue hydrotherapy Stop fentanyl patch and fentanyl IV and control pain with oxycodone 10 every 6 as needed moderate pain--prescription to be given at discharge will need follow-up with PCP with regards to further scripts Await hospital bed placement at home with home health Family aware of impending discharge Atrial flutter with RVR, CHADS2 score >3 HFrEF secondary to a flutter EF 45-50% Amiodarone being down  titrated as per cardiology instructions as DCCV  performed by Dr. Oval Linsey successful Periodic labs including LFTs needed Continues on Eliquis 5 twice daily Needs TOC visit in the outpatient setting Resolving ICU delirium Discontinue safety sitter Continue Klonopin 1 mg twice daily as needed oxycodone-discontinue IV meds Underlying depression anxiety Continue Cymbalta DR 60 daily, Seroquel cut back to 25 at bedtime, continuing sertraline 25 qd Cut back Ativan 2.5 every 4 --DC Haldol Will discharge home on low-dose Klonopin and this will need to be refilled after being seen by PCP DM TY 2 on oral meds prior to admission with complication of hypoglycemia CBG are now slightly higher in the 90s to 110 range she is eating a little bit more Cut back Lantus to 5 units At home is on Amaryl which was resumed on discharge Prior dysphagia Tolerating regular thin liquids since 6/20 previously had core track  Chronic Foley since admission Will need outpatient coordination with PCP with regards to catheter and removal of the same   Discharge Exam: Vitals:   01/18/21 1933 01/19/21 0420  BP: 118/72 125/79  Pulse: 87 80  Resp: 17 17  Temp: 98.1 F (36.7 C) 98.6 F (37 C)  SpO2: 98% 98%    Subj on day of d/c   Awake coherent no distress moving around with therapy less confused much more coherent no spontaneous complaint  General Exam on discharge  Awake coherent pleasant EOMI NCAT looks older than stated age Chest clear no rales no rhonchi S1-S2 seems to be in sinus rhythm Abdomen soft no rebound no guarding Wound not examined today No lower extremity edema Foley catheter in place  Discharge Instructions   Discharge Instructions     Diet - low sodium heart healthy   Complete by: As directed    Discharge wound care:   Complete by: As directed    Per [facility] nurse and gen surgery and OP wound center   Increase activity slowly   Complete by: As directed    Increase activity slowly   Complete by: As directed    No wound care    Complete by: As directed    Referral to Nutrition and Diabetes Services   Complete by: As directed    Choose type of Diabetes Self-Management Training (DSMT) training services and number of hours requested: Initial DSMT: 10 hours   Check all special needs that apply to patient requiring 1 on 1 DSMT: Low literacy   DSMT Content:  Insulin instruction Self-blood glucose monitoring Basic nutrition management Comprehensive self-management skills- All of the content areas     Choose the type of Medical Nutrition Therapy (MNT) and number of hours: Does not apply   FOR MEDICARE PATIENTS: I hereby certify that I am managing this beneficiary's diabetes condition and that the above prescribed training is a necessary part of management.: Yes      Allergies as of 01/19/2021   No Known Allergies      Medication List     STOP taking these medications    Accu-Chek Aviva Plus test strip Generic drug: glucose blood   ALPRAZolam 1 MG tablet Commonly known as: XANAX   amoxicillin-clavulanate 875-125 MG tablet Commonly known as: AUGMENTIN   amphetamine-dextroamphetamine 20 MG tablet Commonly known as: ADDERALL   cephALEXin 500 MG capsule Commonly known as: KEFLEX   clotrimazole-betamethasone cream Commonly known as: LOTRISONE   fluconazole 150 MG tablet Commonly known as: DIFLUCAN   lisinopril-hydrochlorothiazide 10-12.5 MG tablet Commonly known as: ZESTORETIC   meclizine  25 MG tablet Commonly known as: ANTIVERT   meloxicam 15 MG tablet Commonly known as: MOBIC   metroNIDAZOLE 500 MG tablet Commonly known as: FLAGYL   potassium chloride 10 MEQ tablet Commonly known as: KLOR-CON   predniSONE 20 MG tablet Commonly known as: DELTASONE   pregabalin 100 MG capsule Commonly known as: LYRICA   promethazine 25 MG tablet Commonly known as: PHENERGAN   tobramycin 0.3 % ophthalmic solution Commonly known as: TOBREX   traMADol 50 MG tablet Commonly known as: ULTRAM        TAKE these medications    amiodarone 200 MG tablet Commonly known as: PACERONE Take 1 tablet (200 mg total) by mouth 2 (two) times daily for 12 days.   amiodarone 200 MG tablet Commonly known as: PACERONE Take 1 tablet (200 mg total) by mouth daily. Start taking on: January 31, 2021   apixaban 5 MG Tabs tablet Commonly known as: ELIQUIS Take 1 tablet (5 mg total) by mouth 2 (two) times daily.   blood glucose meter kit and supplies Kit Dispense based on patient and insurance preference. Use up to four times daily as directed.   clonazePAM 1 MG disintegrating tablet Commonly known as: KLONOPIN Take 1 tablet (1 mg total) by mouth 2 (two) times daily.   DULoxetine 60 MG capsule Commonly known as: CYMBALTA Take 60 mg by mouth daily.   esomeprazole 40 MG capsule Commonly known as: NEXIUM Take 40 mg by mouth daily.   glimepiride 4 MG tablet Commonly known as: AMARYL Take 4 mg by mouth 2 (two) times daily.   insulin glargine 100 UNIT/ML injection Commonly known as: LANTUS Inject 0.05 mLs (5 Units total) into the skin 2 (two) times daily.   Oxycodone HCl 10 MG Tabs Take 1 tablet (10 mg total) by mouth 3 (three) times daily as needed (pain).   sertraline 50 MG tablet Commonly known as: ZOLOFT Take 0.5 tablets (25 mg total) by mouth daily.               Durable Medical Equipment  (From admission, onward)           Start     Ordered   01/19/21 0844  For home use only DME Tub bench  Once        01/19/21 0843   01/18/21 1436  For home use only DME Other see comment  Once       Comments: For each dressing change will need:  2 ABD pads, 2 3 inch clings, barrier film, and Melipore tape, normal saline  Question:  Length of Need  Answer:  6 Months   01/18/21 1436   01/18/21 1429  For home use only DME Walker rolling  Once       Question Answer Comment  Walker: With [place]   Patient needs a walker to treat with the following condition Weakness      01/18/21  1429   01/18/21 1429  For home use only DME 3 n 1  Once        01/18/21 1429   01/17/21 1323  For home use only DME Hospital bed  Once       Comments: Sister Ward Givens [phone] is contact person for delivery  Question Answer Comment  Length of Need 6 Months   Patient has (list medical condition): gluteal fold abscess and erythema and induration and necrotizing soft tissue infection of left buttock   The above medical condition requires: Patient  requires the ability to reposition frequently   Head must be elevated greater than: 45 degrees   Bed type Semi-electric   Support Surface: Low Air loss Mattress      01/17/21 1323              Discharge Care Instructions  (From admission, onward)           Start     Ordered   01/19/21 0000  Discharge wound care:       Comments: Per [facility] nurse and gen surgery and OP wound center   01/19/21 0924           No Known Allergies  Follow-up Information     [facility]              Follow up.   Why: February 01, 2021 at 0900 am Contact information: [address] [phone]        Harvie Junior, MD Follow up.   Specialty: Family Medicine Why: Call for hospital follow up Contact information: [place] [address] [phone]                  The results of significant diagnostics from this hospitalization (including imaging, microbiology, ancillary and laboratory) are listed below for reference.    Significant Diagnostic Studies: CT HEAD WO CONTRAST  Result Date: 01/09/2021 CLINICAL DATA:  Pain following fall EXAM: CT HEAD WITHOUT CONTRAST TECHNIQUE: Contiguous axial images were obtained from the base of the skull through the vertex without intravenous contrast. COMPARISON:  October 10, 2007 FINDINGS: Brain: Ventricles and sulci are normal in size and configuration. There is no intracranial mass, hemorrhage,  extra-axial fluid collection, or midline shift. Brain parenchyma appears unremarkable. No appreciable acute infarct. Vascular: No hyperdense vessel. Mild calcification in the carotid siphon regions. Skull: Bony calvarium appears intact. Sinuses/Orbits: Paranasal sinuses are clear. Orbits appear symmetric bilaterally. Other: Mastoid air cells clear. IMPRESSION: Normal appearing brain parenchyma. No mass, hemorrhage, extra-axial fluid collection. Mild arterial vascular calcification noted. Electronically Signed   By: Lowella Grip III M.D.   On: 01/09/2021 08:51   DG CHEST PORT 1 VIEW  Result Date: 01/04/2021 CLINICAL DATA:  Fever, on ventilator, hypotension, hypokalemia EXAM: PORTABLE CHEST 1 VIEW COMPARISON:  Portable exam 1012 hours compared to 01/03/2021 FINDINGS: Tip of endotracheal tube projects 8.2 cm above carina. Nasogastric tube extends into stomach. Dual lumen RIGHT jugular central venous catheter with tip projecting over SVC. Enlargement of cardiac silhouette. Mediastinal contours and pulmonary vascularity normal. Subsegmental atelectasis at LEFT base. Small RIGHT pleural effusion. No pneumothorax. IMPRESSION: LEFT basilar atelectasis and small RIGHT pleural effusion. Electronically Signed   By: Lavonia Dana M.D.   On: 01/04/2021 12:36   DG Chest Port 1 View  Result Date: 01/03/2021 CLINICAL DATA:  Respiratory failure. EXAM: PORTABLE CHEST 1 VIEW COMPARISON:  CT 12/31/2020.  12/30/2020. FINDINGS: Endotracheal tube, NG tube, right IJ line stable position. Stable cardiomegaly. Mild bibasilar atelectasis again noted. No pleural effusion or pneumothorax. IMPRESSION: 1.  Lines and tubes in stable position. 2. Stable cardiomegaly. Mild bibasilar atelectasis. Chest is stable from prior exam. Electronically Signed   By: Marcello Moores  Register   On: 01/03/2021 07:46   DG Chest Port 1 View  Result Date: 12/31/2020 CLINICAL DATA:  Hypoxia EXAM: PORTABLE CHEST 1 VIEW COMPARISON:  December 30, 2020 FINDINGS:  Endotracheal tube tip is 7.5 cm above carina. Nasogastric tube tip and  side port are below the diaphragm. Central catheter tip is in the superior vena cava. No pneumothorax. No edema or airspace opacity. There is cardiomegaly with pulmonary vascularity normal. No adenopathy. No bone lesions. IMPRESSION: Tube and catheter positions as described without pneumothorax. No edema or airspace opacity. Stable cardiac enlargement. Electronically Signed   By: Lowella Grip III M.D.   On: 12/31/2020 07:50   DG Chest Port 1 View  Result Date: 12/30/2020 CLINICAL DATA:  Respiratory failure EXAM: PORTABLE CHEST 1 VIEW COMPARISON:  12/28/2020 FINDINGS: Endotracheal tube and feeding tube unchanged. Central venous line unchanged. Stable cardiac silhouette. There is a moderate RIGHT effusion unchanged. Central venous congestion unchanged. IMPRESSION: 1. Stable support apparatus. 2. No change in RIGHT effusion and central venous congestion. Electronically Signed   By: Suzy Bouchard M.D.   On: 12/30/2020 07:32   DG CHEST PORT 1 VIEW  Result Date: 12/28/2020 CLINICAL DATA:  ET tube adjusted EXAM: PORTABLE CHEST 1 VIEW COMPARISON:  12/28/2020 FINDINGS: Endotracheal tube has been retracted with the tip now approximately 4 cm above the carina. Right Central line and NG tube are unchanged. Increasing bilateral perihilar and right lower lobe airspace disease. Heart is borderline in size. IMPRESSION: Endotracheal tube has been retracted, now 4 cm above the carina. Other support devices stable. Increasing bilateral perihilar and right lower lobe airspace disease. Electronically Signed   By: Rolm Baptise M.D.   On: 12/28/2020 22:11   DG CHEST PORT 1 VIEW  Result Date: 12/28/2020 CLINICAL DATA:  Hypoxia EXAM: PORTABLE CHEST 1 VIEW COMPARISON:  December 28, 2020 study obtained earlier in the day FINDINGS: Endotracheal tube tip is in the proximal right main bronchus. Nasogastric tube tip and side port are in the stomach. Central  catheter tip is in the superior vena cava. No pneumothorax. There is mild left base atelectasis. No edema or airspace opacity. There is stable cardiomegaly with pulmonary vascularity normal. No adenopathy. No bone lesions. IMPRESSION: Tube and catheter positions as described without pneumothorax. The endotracheal tube tip is in the proximal right main bronchus. Advise withdrawing endotracheal tube approximately 4 cm. Mild left base atelectasis. No edema or consolidation. Stable cardiomegaly. Critical Value/emergent results were called by telephone at the time of interpretation on 12/28/2020 at 9:49 pm to provider Sandford Craze, RN, who verbally acknowledged these results. Electronically Signed   By: Lowella Grip III M.D.   On: 12/28/2020 21:50   DG CHEST PORT 1 VIEW  Result Date: 12/28/2020 CLINICAL DATA:  Central line placement EXAM: PORTABLE CHEST 1 VIEW COMPARISON:  12/27/2020 FINDINGS: Right dialysis catheter in place with the tip at the cavoatrial junction. No pneumothorax. Cardiomegaly with vascular congestion. Right lower lobe atelectasis or infiltrate. Possible small layering right effusion. No overt edema. No acute bony abnormality. IMPRESSION: Cardiomegaly, vascular congestion. Right lower lobe atelectasis or infiltrate with suspected small right effusion. Electronically Signed   By: Rolm Baptise M.D.   On: 12/28/2020 17:36   DG Abd Portable 1V  Result Date: 01/10/2021 CLINICAL DATA:  NG tube placement EXAM: PORTABLE ABDOMEN - 1 VIEW COMPARISON:  01/07/2021 FINDINGS: Esophageal tube tip overlies the distal stomach. Surgical clips in the right upper quadrant. Visible gas pattern is unobstructed IMPRESSION: Esophageal tube tip overlies the distal stomach Electronically Signed   By: Donavan Foil M.D.   On: 01/10/2021 19:11   DG Abd Portable 1V  Result Date: 01/07/2021 CLINICAL DATA:  Check gastric catheter EXAM: PORTABLE ABDOMEN - 1 VIEW COMPARISON:  None. FINDINGS: Feeding catheter is  noted  extending into the distal aspect of the stomach. Scattered large and small bowel gas is noted. IMPRESSION: Feeding catheter within the distal stomach. Electronically Signed   By: Inez Catalina M.D.   On: 01/07/2021 12:22   DG Abd Portable 1V  Result Date: 12/28/2020 CLINICAL DATA:  OG tube placement EXAM: PORTABLE ABDOMEN - 1 VIEW COMPARISON:  12/28/2020 FINDINGS: OG tube has been advanced with the tip in the right abdomen. This could be in the distal stomach or proximal duodenum. Prior cholecystectomy. IMPRESSION: Advancement of the NG tube into the distal stomach or proximal duodenum. Electronically Signed   By: Rolm Baptise M.D.   On: 12/28/2020 23:04   DG Abd Portable 1V  Result Date: 12/28/2020 CLINICAL DATA:  OG tube placement EXAM: PORTABLE ABDOMEN - 1 VIEW COMPARISON:  None. FINDINGS: OG tube tip is in the proximal stomach with the side port near the GE junction. Prior cholecystectomy. IMPRESSION: OG tube tip in the proximal stomach. Electronically Signed   By: Rolm Baptise M.D.   On: 12/28/2020 21:45   DG Swallowing Func-Speech Pathology  Result Date: 01/14/2021 Formatting of this result is different from the original. Objective Swallowing Evaluation: Type of Study: MBS-Modified Barium Swallow Study  Patient Details Name: Teegan Guinther MRN: [id number] Date of Birth: January 18, 1971 Today's Date: 01/14/2021 Time: SLP Start Time (ACUTE ONLY): 1339 -SLP Stop Time (ACUTE ONLY): 3846 SLP Time Calculation (min) (ACUTE ONLY): 19.9 min Past Medical History: Past Medical History: Diagnosis Date  Depression   DM (diabetes mellitus) (Uncertain)   GERD (gastroesophageal reflux disease)   HLD (hyperlipidemia)   Obesity   Tobacco abuse  Past Surgical History: Past Surgical History: Procedure Laterality Date  ABCESS DRAINAGE  11/2020  bartholin gland cyst  INCISION AND DRAINAGE ABSCESS Left 12/31/2020  Procedure: INCISION AND DRAINAGE ABSCESS BUTTOCK;  Surgeon: Ralene Ok, MD;  Location: [facility];  Service: General;   Laterality: Left;  IRRIGATION AND DEBRIDEMENT BUTTOCKS Left 12/28/2020  Procedure: DEBRIDEMENT LEFT BUTTOCK NECROTIZING SOFT TISSUE INFECTION - TOTAL TISSUE VOLUME 14CM X 12CM X 3CM DEEP;  Surgeon: Georganna Skeans, MD;  Location: [facility];  Service: General;  Laterality: Left;  WOUND DEBRIDEMENT Left 01/03/2021  Procedure: IRRIGATION AND DEBRIDEMENT LEFT BUTTOCK;  Surgeon: Ralene Ok, MD;  Location: [facility];  Service: General;  Laterality: Left; HPI: 50 y/o female initially admitted to [facility] on 5/29 complaining of fever and a buttock abscess, moved to [facility] on 6/3 in the setting of worsening renal failure, acidosis, encephaloapthy with known group B strep wound culture. ETT 6/4-6/12, hx GERD, obesity.  Subjective: alert, upright in chair for procedure Assessment / Plan / Recommendation CHL IP CLINICAL IMPRESSIONS 01/14/2021 Clinical Impression Pt presents with mild pharyngeal dysphagia characterized by impairments in pharyngeal timing. She demonstrated occasional penetration (PAS 2) with larger boluses of thin and nectar thick liquids and this progressed twice to PAS 3 with consecutive swallows of thin liquids via straw. Pt was only able to swallow the 48m barium tablet with regular texture solids; use of puree and thin liquids were unsucessful. Pt has demonstrated improvement in swallow function, and her diet will be advanced to regular texture solids and thin liquids. SLP will follow briefly to ensure tolerance of the advanced diet, but it is anticipated that further SLP services will not clinically indicated thereafter for swallowing. SLP Visit Diagnosis Dysphagia, pharyngeal phase (R13.13) Attention and concentration deficit following -- Frontal lobe and executive function deficit following -- Impact on safety and function Mild aspiration risk  CHL IP TREATMENT RECOMMENDATION 01/14/2021 Treatment Recommendations Therapy as outlined in treatment plan below   Prognosis 01/14/2021 Prognosis for Safe Diet Advancement  Good Barriers to Reach Goals Time post onset Barriers/Prognosis Comment -- CHL IP DIET RECOMMENDATION 01/14/2021 SLP Diet Recommendations Regular solids;Thin liquid Liquid Administration via Cup;Straw Medication Administration Whole meds with puree Compensations Small sips/bites Postural Changes Seated upright at 90 degrees   CHL IP OTHER RECOMMENDATIONS 01/14/2021 Recommended Consults -- Oral Care Recommendations Oral care BID;Patient independent with oral care Other Recommendations --   CHL IP FOLLOW UP RECOMMENDATIONS 01/14/2021 Follow up Recommendations None   CHL IP FREQUENCY AND DURATION 01/14/2021 Speech Therapy Frequency (ACUTE ONLY) min 1 x/week Treatment Duration 1 week      CHL IP ORAL PHASE 01/14/2021 Oral Phase WFL Oral - Pudding Teaspoon -- Oral - Pudding Cup -- Oral - Honey Teaspoon -- Oral - Honey Cup -- Oral - Nectar Teaspoon -- Oral - Nectar Cup -- Oral - Nectar Straw -- Oral - Thin Teaspoon -- Oral - Thin Cup -- Oral - Thin Straw -- Oral - Puree -- Oral - Mech Soft -- Oral - Regular -- Oral - Multi-Consistency -- Oral - Pill -- Oral Phase - Comment --  CHL IP PHARYNGEAL PHASE 01/14/2021 Pharyngeal Phase Impaired Pharyngeal- Pudding Teaspoon -- Pharyngeal -- Pharyngeal- Pudding Cup -- Pharyngeal -- Pharyngeal- Honey Teaspoon -- Pharyngeal -- Pharyngeal- Honey Cup NT Pharyngeal -- Pharyngeal- Nectar Teaspoon -- Pharyngeal -- Pharyngeal- Nectar Cup Delayed swallow initiation-vallecula;Delayed swallow initiation-pyriform sinuses Pharyngeal -- Pharyngeal- Nectar Straw -- Pharyngeal -- Pharyngeal- Thin Teaspoon -- Pharyngeal -- Pharyngeal- Thin Cup Delayed swallow initiation-pyriform sinuses Pharyngeal Material enters airway, remains ABOVE vocal cords then ejected out Pharyngeal- Thin Straw Delayed swallow initiation-pyriform sinuses;Penetration/Aspiration before swallow;Penetration/Aspiration during swallow Pharyngeal Material enters airway, remains ABOVE vocal cords then ejected out;Material enters airway,  remains ABOVE vocal cords and not ejected out Pharyngeal- Puree Delayed swallow initiation-vallecula Pharyngeal -- Pharyngeal- Mechanical Soft -- Pharyngeal -- Pharyngeal- Regular WFL Pharyngeal -- Pharyngeal- Multi-consistency -- Pharyngeal -- Pharyngeal- Pill WFL Pharyngeal -- Pharyngeal Comment --  CHL IP CERVICAL ESOPHAGEAL PHASE 01/14/2021 Cervical Esophageal Phase WFL Pudding Teaspoon -- Pudding Cup -- Honey Teaspoon -- Honey Cup -- Nectar Teaspoon -- Nectar Cup -- Nectar Straw -- Thin Teaspoon -- Thin Cup -- Thin Straw -- Puree -- Mechanical Soft -- Regular -- Multi-consistency -- Pill -- Cervical Esophageal Comment -- Shanika I. Hardin Negus, [place], [place] Office number [phone] Pager [phone] Horton Marshall 01/14/2021, 2:31 PM              DG Swallowing Func-Speech Pathology  Result Date: 01/08/2021 Formatting of this result is different from the original. Objective Swallowing Evaluation: Type of Study: MBS-Modified Barium Swallow Study  Patient Details Name: Verner Kopischke MRN: [id number] Date of Birth: September 01, 1970 Today's Date: 01/08/2021 Time: SLP Start Time (ACUTE ONLY): 1008 -SLP Stop Time (ACUTE ONLY): 53 SLP Time Calculation (min) (ACUTE ONLY): 12 min Past Medical History: Past Medical History: Diagnosis Date  Depression   DM (diabetes mellitus) ([place])   GERD (gastroesophageal reflux disease)   HLD (hyperlipidemia)   Obesity   Tobacco abuse  Past Surgical History: Past Surgical History: Procedure Laterality Date  ABCESS DRAINAGE  11/2020  bartholin gland cyst  INCISION AND DRAINAGE ABSCESS Left 12/31/2020  Procedure: INCISION AND DRAINAGE ABSCESS BUTTOCK;  Surgeon: Ralene Ok, MD;  Location: [place];  Service: General;  Laterality: Left;  IRRIGATION AND DEBRIDEMENT BUTTOCKS Left 12/28/2020  Procedure: DEBRIDEMENT LEFT BUTTOCK NECROTIZING SOFT  TISSUE INFECTION - TOTAL TISSUE VOLUME 14CM X 12CM X 3CM DEEP;  Surgeon: Georganna Skeans, MD;  Location: [place];  Service:  General;  Laterality: Left;  WOUND DEBRIDEMENT Left 01/03/2021  Procedure: IRRIGATION AND DEBRIDEMENT LEFT BUTTOCK;  Surgeon: Ralene Ok, MD;  Location: [place];  Service: General;  Laterality: Left; HPI: 50 y/o female initially admitted to [facility] on 5/29 complaining of fever and a buttock abscess, moved to [facility] on 6/3 in the setting of worsening renal failure, acidosis, encephaloapthy with known group B strep wound culture. ETT 6/4-6/12, hx GERD, obesity.  Subjective: alert, upright in chair for procedure Assessment / Plan / Recommendation CHL IP CLINICAL IMPRESSIONS 01/08/2021 Clinical Impression Pt presents with a mild oral and mild to moderate pharyngeal dysphagia, likely from deconditioning and prolonged intubation. Pt with mild oral residuals with solids and thicker POs, clearing with second swallows. Prolonged mastication noted with solids (pt states at baseline consuming regular diet, despite edentulous status). Pharyngeally pt with reduced arytenoid to base of epiglottic contact allowing for during the swallow laryngeal penetration of thin liquids (PAS-3) and episodic silent aspiration (PAS-8) with thins during larger consecutive swallows including cup and straw use. Airway protection was intact with remaining POs. Mild vallecular residuals noted with puree and mechanical soft textures due to reduced pharyngeal constriction and decreased base of tongue retraction. Recommend dysphagia 3 (mechanical soft) and nectar thick liquids with meds whole in puree. SLP to follow up for diet tolerance and advancement as pt tolerates.  SLP Visit Diagnosis Dysphagia, oropharyngeal phase (R13.12) Attention and concentration deficit following -- Frontal lobe and executive function deficit following -- Impact on safety and function Mild aspiration risk;Moderate aspiration risk   CHL IP TREATMENT RECOMMENDATION 01/08/2021 Treatment Recommendations Therapy as outlined in treatment plan below   Prognosis 01/08/2021 Prognosis  for Safe Diet Advancement Good Barriers to Reach Goals Time post onset Barriers/Prognosis Comment -- CHL IP DIET RECOMMENDATION 01/08/2021 SLP Diet Recommendations Nectar thick liquid;Dysphagia 3 (Mech soft) solids Liquid Administration via Cup;Straw Medication Administration Whole meds with puree Compensations Slow rate;Small sips/bites;Clear throat intermittently;Minimize environmental distractions Postural Changes Seated upright at 90 degrees;Remain semi-upright after after feeds/meals (Comment)   CHL IP OTHER RECOMMENDATIONS 01/08/2021 Recommended Consults -- Oral Care Recommendations Oral care BID Other Recommendations --   CHL IP FOLLOW UP RECOMMENDATIONS 01/08/2021 Follow up Recommendations Other (comment)   CHL IP FREQUENCY AND DURATION 01/08/2021 Speech Therapy Frequency (ACUTE ONLY) min 2x/week Treatment Duration 2 weeks      CHL IP ORAL PHASE 01/08/2021 Oral Phase Impaired Oral - Pudding Teaspoon -- Oral - Pudding Cup -- Oral - Honey Teaspoon -- Oral - Honey Cup Delayed oral transit;Lingual/palatal residue Oral - Nectar Teaspoon -- Oral - Nectar Cup Lingual/palatal residue Oral - Nectar Straw -- Oral - Thin Teaspoon -- Oral - Thin Cup Lingual/palatal residue Oral - Thin Straw -- Oral - Puree Lingual/palatal residue Oral - Mech Soft Delayed oral transit;Decreased bolus cohesion;Lingual/palatal residue Oral - Regular -- Oral - Multi-Consistency -- Oral - Pill -- Oral Phase - Comment --  CHL IP PHARYNGEAL PHASE 01/08/2021 Pharyngeal Phase Impaired Pharyngeal- Pudding Teaspoon -- Pharyngeal -- Pharyngeal- Pudding Cup -- Pharyngeal -- Pharyngeal- Honey Teaspoon -- Pharyngeal -- Pharyngeal- Honey Cup Delayed swallow initiation-pyriform sinuses Pharyngeal -- Pharyngeal- Nectar Teaspoon -- Pharyngeal -- Pharyngeal- Nectar Cup Delayed swallow initiation-pyriform sinuses;Reduced tongue base retraction;Reduced pharyngeal peristalsis;Pharyngeal residue - valleculae Pharyngeal -- Pharyngeal- Nectar Straw -- Pharyngeal --  Pharyngeal- Thin Teaspoon -- Pharyngeal -- Pharyngeal- Thin Cup Delayed swallow initiation-pyriform sinuses;Penetration/Aspiration  during swallow;Reduced airway/laryngeal closure;Reduced epiglottic inversion;Reduced pharyngeal peristalsis;Trace aspiration Pharyngeal Material enters airway, remains ABOVE vocal cords and not ejected out;Material enters airway, passes BELOW cords without attempt by patient to eject out (silent aspiration) Pharyngeal- Thin Straw Penetration/Aspiration during swallow;Delayed swallow initiation-pyriform sinuses;Reduced epiglottic inversion;Reduced airway/laryngeal closure Pharyngeal Material enters airway, remains ABOVE vocal cords and not ejected out;Material enters airway, passes BELOW cords without attempt by patient to eject out (silent aspiration) Pharyngeal- Puree Delayed swallow initiation-vallecula Pharyngeal -- Pharyngeal- Mechanical Soft Delayed swallow initiation-vallecula;Reduced pharyngeal peristalsis;Reduced tongue base retraction;Pharyngeal residue - valleculae Pharyngeal -- Pharyngeal- Regular -- Pharyngeal -- Pharyngeal- Multi-consistency -- Pharyngeal -- Pharyngeal- Pill -- Pharyngeal -- Pharyngeal Comment --  CHL IP CERVICAL ESOPHAGEAL PHASE 01/08/2021 Cervical Esophageal Phase WFL Pudding Teaspoon -- Pudding Cup -- Honey Teaspoon -- Honey Cup -- Nectar Teaspoon -- Nectar Cup -- Nectar Straw -- Thin Teaspoon -- Thin Cup -- Thin Straw -- Puree -- Mechanical Soft -- Regular -- Multi-consistency -- Pill -- Cervical Esophageal Comment -- Chelsea E Hartness MA, CCC-SLP 01/08/2021, 10:36 AM              ECHOCARDIOGRAM COMPLETE  Result Date: 01/02/2021    ECHOCARDIOGRAM REPORT   Patient Name:   Tamekia Wrubel Date of Exam: 01/02/2021 Medical Rec #:  [id number]       Height:       62.0 in Accession #:    [id number]      Weight:       218.3 lb Date of Birth:  02/27/1971       BSA:          1.984 m Patient Age:    50 years        BP:           102/72 mmHg Patient Gender: F                HR:           100 bpm. Exam Location:  Inpatient Procedure: 2D Echo, Color Doppler, Cardiac Doppler and Intracardiac            Opacification Agent Indications:    Dyspnea  History:        Patient has no prior history of Echocardiogram examinations.                 Risk Factors:Current Smoker, Dyslipidemia and Diabetes.  Sonographer:    Bernadene Person RDCS Referring Phys: [place]  1. Left ventricular ejection fraction, by estimation, is 45 to 50%. The left ventricle has mildly decreased function. The left ventricle demonstrates global hypokinesis. Left ventricular diastolic parameters were normal.  2. Right ventricular systolic function is normal. The right ventricular size is normal. There is normal pulmonary artery systolic pressure.  3. The mitral valve is normal in structure. Mild mitral valve regurgitation. No evidence of mitral stenosis.  4. The aortic valve is normal in structure. Aortic valve regurgitation is not visualized. No aortic stenosis is present.  5. The inferior vena cava is normal in size with greater than 50% respiratory variability, suggesting right atrial pressure of 3 mmHg. FINDINGS  Left Ventricle: Left ventricular ejection fraction, by estimation, is 45 to 50%. The left ventricle has mildly decreased function. The left ventricle demonstrates global hypokinesis. Definity contrast agent was given IV to delineate the left ventricular  endocardial borders. The left ventricular internal cavity size was normal in size. There is no left ventricular hypertrophy. Left ventricular diastolic parameters were normal. Right Ventricle: The  right ventricular size is normal. No increase in right ventricular wall thickness. Right ventricular systolic function is normal. There is normal pulmonary artery systolic pressure. The tricuspid regurgitant velocity is 1.61 m/s, and  with an assumed right atrial pressure of 3 mmHg, the estimated right ventricular systolic pressure is  72.5 mmHg. Left Atrium: Left atrial size was normal in size. Right Atrium: Right atrial size was normal in size. Pericardium: There is no evidence of pericardial effusion. Mitral Valve: The mitral valve is normal in structure. Mild mitral valve regurgitation. No evidence of mitral valve stenosis. Tricuspid Valve: The tricuspid valve is normal in structure. Tricuspid valve regurgitation is mild . No evidence of tricuspid stenosis. Aortic Valve: The aortic valve is normal in structure. Aortic valve regurgitation is not visualized. No aortic stenosis is present. Pulmonic Valve: The pulmonic valve was normal in structure. Pulmonic valve regurgitation is not visualized. No evidence of pulmonic stenosis. Aorta: The aortic root is normal in size and structure. Venous: The inferior vena cava is normal in size with greater than 50% respiratory variability, suggesting right atrial pressure of 3 mmHg. IAS/Shunts: No atrial level shunt detected by color flow Doppler.  LEFT VENTRICLE PLAX 2D LVIDd:         5.50 cm LVIDs:         4.40 cm LV PW:         1.00 cm LV IVS:        0.90 cm LVOT diam:     1.80 cm LV SV:         41 LV SV Index:   20 LVOT Area:     2.54 cm  LEFT ATRIUM             Index       RIGHT ATRIUM           Index LA diam:        3.80 cm 1.92 cm/m  RA Area:     14.00 cm LA Vol (A2C):   53.0 ml 26.71 ml/m RA Volume:   30.90 ml  15.57 ml/m LA Vol (A4C):   47.4 ml 23.89 ml/m LA Biplane Vol: 53.0 ml 26.71 ml/m  AORTIC VALVE LVOT Vmax:   103.17 cm/s LVOT Vmean:  65.633 cm/s LVOT VTI:    0.160 m  AORTA Ao Root diam: 2.80 cm Ao Asc diam:  2.90 cm TRICUSPID VALVE TR Peak grad:   10.4 mmHg TR Vmax:        161.00 cm/s  SHUNTS Systemic VTI:  0.16 m Systemic Diam: 1.80 cm Candee Furbish MD Electronically signed by Candee Furbish MD Signature Date/Time: 01/02/2021/4:06:25 PM    Final    ECHO TEE  Result Date: 01/18/2021    TRANSESOPHOGEAL ECHO REPORT   Patient Name:   Subrina Thane Date of Exam: 01/18/2021 Medical Rec #:   [id number]       Height:       62.0 in Accession #:    [id number]      Weight:       205.9 lb Date of Birth:  [birth year]       BSA:          1.935 m Patient Age:    34 years        BP:           103/73 mmHg Patient Gender: F               HR:  105 bpm. Exam Location:  Inpatient Procedure: Transesophageal Echo, Color Doppler and Cardiac Doppler Indications:     atrial flutter  History:         Patient has prior history of Echocardiogram examinations, most                  recent 01/02/2021. Sepsis; Risk Factors:Diabetes.  Sonographer:     Johny Chess Referring Phys:  [id number] [place] Diagnosing Phys: Skeet Latch MD PROCEDURE: After discussion of the risks and benefits of a TEE, an informed consent was obtained from the patient. The transesophogeal probe was passed without difficulty through the esophogus of the patient. Imaged were obtained with the patient in a left lateral decubitus position. Local oropharyngeal anesthetic was provided with Cetacaine. Sedation performed by different physician. The patient was monitored while under deep sedation. Anesthestetic sedation was provided intravenously by Anesthesiology: 448m of Propofol. The patient's vital signs; including heart rate, blood pressure, and oxygen saturation; remained stable throughout the procedure. The patient developed no complications during the procedure. A direct current cardioversion was performed. IMPRESSIONS  1. Left ventricular ejection fraction, by estimation, is 60 to 65%. The left ventricle has normal function. The left ventricle has no regional wall motion abnormalities.  2. Right ventricular systolic function is normal. The right ventricular size is normal.  3. A left atrial/left atrial appendage thrombus was detected. The LAA emptying velocity was 74 cm/s.  4. The mitral valve is normal in structure. Trivial mitral valve regurgitation. No evidence of mitral stenosis.  5. The aortic valve is tricuspid. Aortic valve  regurgitation is not visualized. No aortic stenosis is present.  6. The inferior vena cava is normal in size with greater than 50% respiratory variability, suggesting right atrial pressure of 3 mmHg. Conclusion(s)/Recommendation(s): No LA/LAA thrombus identified. Successful cardioversion performed with restoration of normal sinus rhythm. FINDINGS  Left Ventricle: Left ventricular ejection fraction, by estimation, is 60 to 65%. The left ventricle has normal function. The left ventricle has no regional wall motion abnormalities. The left ventricular internal cavity size was normal in size. There is  no left ventricular hypertrophy. Right Ventricle: The right ventricular size is normal. No increase in right ventricular wall thickness. Right ventricular systolic function is normal. Left Atrium: Left atrial size was normal in size. A left atrial/left atrial appendage thrombus was detected. The LAA emptying velocity was 74 cm/s. Right Atrium: Right atrial size was normal in size. Pericardium: There is no evidence of pericardial effusion. Mitral Valve: The mitral valve is normal in structure. Trivial mitral valve regurgitation. No evidence of mitral valve stenosis. Tricuspid Valve: The tricuspid valve is normal in structure. Tricuspid valve regurgitation is mild . No evidence of tricuspid stenosis. Aortic Valve: The aortic valve is tricuspid. Aortic valve regurgitation is not visualized. No aortic stenosis is present. Pulmonic Valve: The pulmonic valve was normal in structure. Pulmonic valve regurgitation is not visualized. No evidence of pulmonic stenosis. Aorta: The aortic root is normal in size and structure. Venous: The inferior vena cava is normal in size with greater than 50% respiratory variability, suggesting right atrial pressure of 3 mmHg. IAS/Shunts: No atrial level shunt detected by color flow Doppler.  TRICUSPID VALVE TR Peak grad:   17.1 mmHg TR Vmax:        207.00 cm/s TSkeet LatchMD Electronically  signed by TSkeet LatchMD Signature Date/Time: 01/18/2021/6:20:11 PM    Final     Microbiology: No results found for this or any previous visit (from  the past 240 hour(s)).   Labs: Basic Metabolic Panel: Recent Labs  Lab 01/14/21 0842 01/15/21 0217 01/16/21 0023 01/17/21 1117 01/18/21 0209  NA 138 138 136 138 136  K 3.9 4.0 3.4* 5.0 3.6  CL 105 105 104 107 101  CO2 _0 21* 26  GLUCOSE 214* 142* 126* 86 131*  BUN _1 CREATININE 1.19* 1.05* 1.05* 1.11* 1.06*  CALCIUM 8.5* 8.5* 8.3* 8.6* 8.5*  MG 1.9 2.0 1.8  --  1.8   Liver Function Tests: Recent Labs  Lab 01/17/21 1117 01/18/21 0209  AST 34 22  ALT 16 15  ALKPHOS 154* 154*  BILITOT 0.7 0.8  PROT 7.2 6.5  ALBUMIN 2.2* 2.0*   No results for input(s): LIPASE, AMYLASE in the last 168 hours. No results for input(s): AMMONIA in the last 168 hours. CBC: Recent Labs  Lab 01/13/21 0256 01/14/21 0842 01/15/21 0217 01/16/21 0023  WBC 6.1 4.5 4.0 5.9  HGB 7.8* 7.9* 7.5* 7.5*  HCT 24.1* 24.9* 23.3* 23.7*  MCV 101.3* 102.0* 101.3* 100.0  PLT 337 330 277 275   Cardiac Enzymes: No results for input(s): CKTOTAL, CKMB, CKMBINDEX, TROPONINI in the last 168 hours. BNP: BNP (last 3 results) Recent Labs    12/28/20 1641  BNP 1,323.6*    ProBNP (last 3 results) No results for input(s): PROBNP in the last 8760 hours.  CBG: Recent Labs  Lab 01/18/21 1648 01/18/21 1930 01/19/21 0008 01/19/21 0418 01/19/21 0751  GLUCAP 119* 123* 138* 109* 111*       Signed:  Nita Sells MD   [facility] 01/19/2021, 9:33 AM

## 2021-01-19 NOTE — TOC Transition Note (Signed)
Transition of Care [facility]) - CM/SW Discharge Note   Patient Details  Name: [name] [name] MRN: [id number] Date of Birth: 1971/04/05  [facility] CM/SW Contact:  Bess Kinds, RN Phone Number: [phone] 01/19/2021, 10:07 AM   Clinical Narrative:     DC order placed by MD. Sherron Monday with patient at bedside, she is excited to be going home with her sister. Ambulance transport arranged. Spoke with [facility], she is ready. Medical transport paperwork completed. No further TOC needs identified.  Final next level of care: Home/Self Care Barriers to Discharge: No Barriers Identified   Patient Goals and CMS Choice Patient states their goals for this hospitalization and ongoing recovery are:: return to sister's home at [address], [place], [place] [address] CMS Medicare.gov Compare Post Acute Care list provided to:: Patient Choice offered to / list presented to : Patient  Discharge Placement                       Discharge Plan and Services   Discharge Planning Services: CM Consult Post Acute Care Choice: Home Health          DME Arranged: Hospital bed, 3-N-1, Tub bench, Walker rolling DME Agency: [employer] Date DME Agency Contacted: 01/19/21 Time DME Agency Contacted: [phone] Representative spoke with at DME Agency: Leavy Cella HH Arranged: RN, PT          Social Determinants of Health (SDOH) Interventions     Readmission Risk Interventions No flowsheet data found.

## 2021-01-19 NOTE — Progress Notes (Signed)
Discharge insructions given to patient. Patient verbalized understanding of the teaching. Patient's sister was taught yesterday on how to preform dressing changes and was sent home with 3 days worth of supplies.

## 2021-01-21 ENCOUNTER — Other Ambulatory Visit: Payer: Self-pay | Admitting: Family Medicine

## 2021-01-21 ENCOUNTER — Encounter (HOSPITAL_COMMUNITY): Payer: Self-pay | Admitting: Cardiovascular Disease

## 2021-01-21 MED ORDER — OXYCODONE HCL 10 MG PO TABS: 10.0000 mg | ORAL_TABLET | Freq: Three times a day (TID) | ORAL | 0 refills | Status: DC | PRN

## 2021-01-21 MED ORDER — OXYCODONE-ACETAMINOPHEN 10-325 MG PO TABS: 1.0000 | ORAL_TABLET | Freq: Four times a day (QID) | ORAL | 0 refills | Status: AC | PRN

## 2021-01-21 MED ORDER — CLONAZEPAM 1 MG PO TBDP: 1.0000 mg | ORAL_TABLET | Freq: Two times a day (BID) | ORAL | 0 refills | Status: AC

## 2021-01-24 NOTE — Care Management (Signed)
NCM received message that patient's sister called and was asking why a HHRN has not visited and also they have not received supplies.   Prior to discharge NCM explained to patient, son, and her sister [name] [name], NCM called all home health agencies that cover patient's address and sister's address, no agency accepted either due to patient's insurance or no staffing. See prior note.   NCM discussed possible SNF placement for wound care. They did not want SNF. NCM explained bedside nurse would provide teaching prior to discharge and NCM asked bedside to send some dressing supplies with patient at discharge. Wound care supplies were ordered through [facility].   NCM called [employer] today , dressing supplies were put "on hold" and NCM not given a reason why. [employer] aware patient at home needing supplies. NCM requested [employer] call patient and her sister [name] [name] regarding supplies.  NCM called [facility] , no answer and voicemail not set up.    [employer] will call patient

## 2021-01-25 ENCOUNTER — Other Ambulatory Visit: Payer: Self-pay

## 2021-01-25 ENCOUNTER — Emergency Department (HOSPITAL_COMMUNITY)
Admission: EM | Admit: 2021-01-25 | Discharge: 2021-01-25 | Disposition: A | Discharge status: Home / Self Care | Payer: Medicaid Other | Attending: Emergency Medicine | Admitting: Emergency Medicine

## 2021-01-25 DIAGNOSIS — T83028A Displacement of other indwelling urethral catheter, initial encounter: Secondary | ICD-10-CM | POA: Insufficient documentation

## 2021-01-25 DIAGNOSIS — Z794 Long term (current) use of insulin: Secondary | ICD-10-CM | POA: Diagnosis not present

## 2021-01-25 DIAGNOSIS — F17200 Nicotine dependence, unspecified, uncomplicated: Secondary | ICD-10-CM | POA: Diagnosis not present

## 2021-01-25 DIAGNOSIS — E119 Type 2 diabetes mellitus without complications: Secondary | ICD-10-CM | POA: Diagnosis not present

## 2021-01-25 DIAGNOSIS — Z466 Encounter for fitting and adjustment of urinary device: Secondary | ICD-10-CM | POA: Diagnosis not present

## 2021-01-25 DIAGNOSIS — Z7901 Long term (current) use of anticoagulants: Secondary | ICD-10-CM | POA: Insufficient documentation

## 2021-01-25 DIAGNOSIS — I4891 Unspecified atrial fibrillation: Secondary | ICD-10-CM | POA: Diagnosis not present

## 2021-01-25 NOTE — ED Provider Notes (Signed)
[facility] EMERGENCY DEPARTMENT Provider Note   CSN: [id number] Arrival date & time: 01/25/21  2012     History No chief complaint on file.   [name] [name] is a 50 y.o. female.  HPI Patient is a 50 year old female with a medical history as noted below.  She was recently admitted to the emergency department and discharged on 01/19/21 with a foley catheter still in place.  Per records, patient needed outpatient coordination for removal of her Foley catheter through her PCP.  Patient states that she just set up a PCP today and did not have a PCP at the time of discharge.  She states she is now able to urinate on her own and is actually urinating around the catheter and unable to use the toilet due to the catheter still being in place.  She requests to have it removed in the ED and to be discharged home.  She denies any dysuria, fevers, chest pain, shortness of breath.  Patient has no physical complaints at this time.    Past Medical History:  Diagnosis Date   Depression    DM (diabetes mellitus) ([place])    GERD (gastroesophageal reflux disease)    HLD (hyperlipidemia)    Obesity    Tobacco abuse     Patient Active Problem List   Diagnosis Date Noted   Encounter for central line placement    Encounter for orogastric (OG) tube placement    Hyperkalemia 01/05/2021   New onset a-fib ([place]) 01/05/2021   Acute combined systolic and diastolic heart failure ([place]) 93/23/5573   Acute metabolic encephalopathy 22/08/5425   History of ETT    Acute hypercapnic respiratory failure ([place])    Typical atrial flutter ([place])    Necrotizing soft tissue infection 12/29/2020   Sepsis due to Streptococcus, group B ([place]) 12/29/2020   Acute kidney injury ([place]) 12/29/2020   Obesity 12/29/2020   Type 2 diabetes mellitus with hyperglycemia ([place]) 12/29/2020   Septic shock ([place]) 12/28/2020    Past Surgical History:  Procedure Laterality Date   ABCESS DRAINAGE  11/2020   bartholin gland  cyst   CARDIOVERSION N/A 01/18/2021   Procedure: CARDIOVERSION;  Surgeon: Skeet Latch, MD;  Location: [place];  Service: Cardiovascular;  Laterality: N/A;   INCISION AND DRAINAGE ABSCESS Left 12/31/2020   Procedure: INCISION AND DRAINAGE ABSCESS BUTTOCK;  Surgeon: Ralene Ok, MD;  Location: [place];  Service: General;  Laterality: Left;   IRRIGATION AND DEBRIDEMENT BUTTOCKS Left 12/28/2020   Procedure: DEBRIDEMENT LEFT BUTTOCK NECROTIZING SOFT TISSUE INFECTION - TOTAL TISSUE VOLUME 14CM X 12CM X 3CM DEEP;  Surgeon: Georganna Skeans, MD;  Location: [place];  Service: General;  Laterality: Left;   TEE WITHOUT CARDIOVERSION N/A 01/18/2021   Procedure: TRANSESOPHAGEAL ECHOCARDIOGRAM (TEE);  Surgeon: Skeet Latch, MD;  Location: [place];  Service: Cardiovascular;  Laterality: N/A;   WOUND DEBRIDEMENT Left 01/03/2021   Procedure: IRRIGATION AND DEBRIDEMENT LEFT BUTTOCK;  Surgeon: Ralene Ok, MD;  Location: [place];  Service: General;  Laterality: Left;     OB History   No obstetric history on file.     No family history on file.  Social History   Tobacco Use   Smoking status: Every Day    Pack years: 0.00   Smokeless tobacco: Never    Home Medications Prior to Admission medications   Medication Sig Start Date End Date Taking? Authorizing Provider  amiodarone (PACERONE) 200 MG tablet Take 1 tablet (200 mg total) by mouth 2 (two)  times daily for 12 days. 01/18/21 01/30/21  Nita Sells, MD  amiodarone (PACERONE) 200 MG tablet Take 1 tablet (200 mg total) by mouth daily. 01/31/21   Nita Sells, MD  apixaban (ELIQUIS) 5 MG TABS tablet Take 1 tablet (5 mg total) by mouth 2 (two) times daily. 01/18/21   Nita Sells, MD  blood glucose meter kit and supplies KIT Dispense based on patient and insurance preference. Use up to four times daily as directed. 01/18/21   Nita Sells, MD  clonazePAM (KLONOPIN) 1 MG disintegrating tablet Take 1 tablet (1 mg  total) by mouth 2 (two) times daily. 01/21/21   Nita Sells, MD  DULoxetine (CYMBALTA) 60 MG capsule Take 60 mg by mouth daily. 12/23/20   [provider]  esomeprazole (NEXIUM) 40 MG capsule Take 40 mg by mouth daily. 12/08/20   [provider]  glimepiride (AMARYL) 4 MG tablet Take 4 mg by mouth 2 (two) times daily. 08/27/20   [provider]  insulin glargine (LANTUS) 100 UNIT/ML injection Inject 0.05 mLs (5 Units total) into the skin 2 (two) times daily. 01/18/21   Nita Sells, MD  oxyCODONE-acetaminophen (PERCOCET) 10-325 MG tablet Take 1 tablet by mouth every 6 (six) hours as needed for pain. 01/21/21 01/21/22  Nita Sells, MD  sertraline (ZOLOFT) 50 MG tablet Take 0.5 tablets (25 mg total) by mouth daily. 01/19/21   Nita Sells, MD    Allergies    Patient has no known allergies.  Review of Systems   Review of Systems  Constitutional:  Negative for chills and fever.  Respiratory:  Negative for shortness of breath.   Cardiovascular:  Negative for chest pain.  Genitourinary:  Negative for decreased urine volume, difficulty urinating, dysuria, pelvic pain and vaginal pain.   Physical Exam Updated Vital Signs BP (!) 150/78 (BP Location: Left Arm)   Pulse 78   Temp 98.6 F (37 C) (Oral)   Resp 18   SpO2 100%   Physical Exam Vitals and nursing note reviewed.  Constitutional:      General: She is not in acute distress.    Appearance: She is well-developed.  HENT:     Head: Normocephalic and atraumatic.     Right Ear: External ear normal.     Left Ear: External ear normal.  Eyes:     General: No scleral icterus.       Right eye: No discharge.        Left eye: No discharge.     Conjunctiva/sclera: Conjunctivae normal.  Neck:     Trachea: No tracheal deviation.  Cardiovascular:     Rate and Rhythm: Normal rate.  Pulmonary:     Effort: Pulmonary effort is normal. No respiratory distress.     Breath sounds: No stridor.   Abdominal:     General: Abdomen is flat. There is no distension.     Palpations: Abdomen is soft.     Tenderness: There is no abdominal tenderness.     Comments: Protuberant abdomen that is soft and nontender.  Musculoskeletal:        General: No swelling or deformity.     Cervical back: Neck supple.  Skin:    General: Skin is warm and dry.     Findings: No rash.  Neurological:     Mental Status: She is alert.     Cranial Nerves: Cranial nerve deficit: no gross deficits.   ED Results / Procedures / Treatments   Labs (all labs ordered are listed, but  only abnormal results are displayed) Labs Reviewed - No data to display  EKG None  Radiology No results found.  Procedures Procedures   Medications Ordered in ED Medications - No data to display  ED Course  I have reviewed the triage vital signs and the nursing notes.  Pertinent labs & imaging results that were available during my care of the patient were reviewed by me and considered in my medical decision making (see chart for details).    MDM Rules/Calculators/A&P                          Patient is a 50 year old female with a complicated medical history who presents to the emergency department for removal of her Foley catheter.  Given her recent admission and significant medical history discussed basic work-up but patient declined.  She states that she just wants her catheter removed and to be discharged home.  She does not have a PCP that could set up catheter removal for her which is why she came to the emergency department today.  Patient's vital signs are all stable.  Physical exam was reassuring.  She is afebrile, not tachycardic.  I had the nursing staff remove her Foley catheter.  Patient was very Patent attorney.  Feel that she is stable for discharge at this time and she is agreeable.  Patient given very strict return precautions and she knows to return to the emergency department with any new or worsening symptoms.   Her questions were answered and she was amicable at the time of discharge.  Final Clinical Impression(s) / ED Diagnoses Final diagnoses:  Encounter for Foley catheter removal   Rx / DC Orders ED Discharge Orders     None        Rayna Sexton, PA-C 01/25/21 2113    Drenda Freeze, MD 01/25/21 2148

## 2021-01-25 NOTE — Care Management (Addendum)
Spoke to patient and sister [name] [name] last evening. Both upset patient does not have a home health nurse.   Again discussed NCM called every home health agency listed for their zip code on Medicare.gov website and could not find an accepting home health agency. This was explained to patient and sister prior to discharge. Discussed family would need to be taught wound care prior to discharge. The other option was SNF and either wanted SNF. Patient and sister still do not want SNF.   Prior to discharge patient's 25 year old son was going to be with patient during the day and [name] [name] at night. Also [name] [name] had said their were other family members to assist.   Patient has changed PCP's since discharge. She plans to establish care with [name] [name] at [facility] [address], [place] [phone] . [name] office has requested records but was told it would take 7 business days. [name] and [name] requesting NCM fax discharge summary to [name] [facility] office. NCM called same spoke to [name] . Discharge summary faxed to [phone].  Patient has also not received dressing supplies from [employer]. NCM called [employer] yesterday and asked [employer] to call patient or [name] [name]. [name] [name] and patient have not heard from [employer].   NCM has called [name] with [employer] and waiting for call back.     [phone] Spoke to [name] with [employer] , he will check on dressing supplies and call/text [name] [name] at [phone] .   NCM has confirmation fax went through to [name] [facility] office.   NCM texted [name] [name] at [phone] with above information

## 2021-01-25 NOTE — Discharge Instructions (Addendum)
Please come back to the emergency department with any new or worsening symptoms.  Please follow-up with your regular doctor.  It was a pleasure to meet you.

## 2021-01-25 NOTE — ED Triage Notes (Signed)
Pt admitted to hospital 5/29-6-25, was discharged w catheter in place, states she needs to get it taken out. Denies UTI symptoms, denies fever, no additional complaints.

## 2021-01-31 ENCOUNTER — Other Ambulatory Visit: Payer: Self-pay

## 2021-01-31 ENCOUNTER — Encounter (HOSPITAL_BASED_OUTPATIENT_CLINIC_OR_DEPARTMENT_OTHER): Payer: Medicaid Other | Attending: Internal Medicine | Admitting: Internal Medicine

## 2021-01-31 DIAGNOSIS — Z794 Long term (current) use of insulin: Secondary | ICD-10-CM | POA: Diagnosis not present

## 2021-01-31 DIAGNOSIS — E1140 Type 2 diabetes mellitus with diabetic neuropathy, unspecified: Secondary | ICD-10-CM | POA: Diagnosis not present

## 2021-01-31 DIAGNOSIS — Y838 Other surgical procedures as the cause of abnormal reaction of the patient, or of later complication, without mention of misadventure at the time of the procedure: Secondary | ICD-10-CM | POA: Insufficient documentation

## 2021-01-31 DIAGNOSIS — T8131XD Disruption of external operation (surgical) wound, not elsewhere classified, subsequent encounter: Secondary | ICD-10-CM | POA: Insufficient documentation

## 2021-01-31 DIAGNOSIS — F17210 Nicotine dependence, cigarettes, uncomplicated: Secondary | ICD-10-CM | POA: Diagnosis not present

## 2021-01-31 DIAGNOSIS — I4891 Unspecified atrial fibrillation: Secondary | ICD-10-CM | POA: Insufficient documentation

## 2021-01-31 DIAGNOSIS — Z7901 Long term (current) use of anticoagulants: Secondary | ICD-10-CM | POA: Diagnosis not present

## 2021-01-31 DIAGNOSIS — Z833 Family history of diabetes mellitus: Secondary | ICD-10-CM | POA: Diagnosis not present

## 2021-01-31 NOTE — Progress Notes (Signed)
[name] [name] ([id number]) Visit Report for 01/31/2021 Chief Complaint Document Details Patient Name: Date of Service: [name] [name], [name] [name] 01/31/2021 2:45 PM Medical Record Number: [id number] Patient Account Number: [id number] Date of Birth/Sex: Treating RN: 09-Oct-1970 (50 y.o. Female) Shawn Stall Primary Care Provider: Romilda Garret HN Other Clinician: Referring Provider: Treating Provider/Extender: Barbie Banner in Treatment: 0 Information Obtained from: Patient Chief Complaint 01/31/2021; patient is here for review of a very large surgical wound on the left buttock Electronic Signature(s) Signed: 01/31/2021 8:35:52 PM By: Baltazar Najjar MD Entered By: Baltazar Najjar on 01/31/2021 17:09:18 -------------------------------------------------------------------------------- Debridement Details Patient Name: Date of Service: [name] [name] 01/31/2021 2:45 PM Medical Record Number: [id number] Patient Account Number: [id number] Date of Birth/Sex: Treating RN: [birth year] (50 y.o. Female) Shawn Stall Primary Care Provider: Romilda Garret HN Other Clinician: Referring Provider: Treating Provider/Extender: Barbie Banner in Treatment: 0 Debridement Performed for Assessment: Wound #1 Left Gluteus Performed By: Clinician Antonieta Iba, RN Debridement Type: Chemical/Enzymatic/Mechanical Agent Used: gauze and wound cleanser Level of Consciousness (Pre-procedure): Awake and Alert Pre-procedure Verification/Time Out No Taken: Bleeding: None Response to Treatment: Procedure was tolerated well Level of Consciousness (Post- Awake and Alert procedure): Post Debridement Measurements of Total Wound Length: (cm) 16 Width: (cm) 8 Depth: (cm) 3 Volume: (cm) 301.593 Character of Wound/Ulcer Post Debridement: Stable Post Procedure Diagnosis Same as Pre-procedure Electronic Signature(s) Signed: 01/31/2021 6:24:11 PM By: Shawn Stall Signed: 01/31/2021  8:35:52 PM By: Baltazar Najjar MD Entered By: Baltazar Najjar on 01/31/2021 17:08:59 -------------------------------------------------------------------------------- HPI Details Patient Name: Date of Service: [name] [name] 01/31/2021 2:45 PM Medical Record Number: [id number] Patient Account Number: [id number] Date of Birth/Sex: Treating RN: 1971/05/11 (50 y.o. Female) Shawn Stall Primary Care Provider: Romilda Garret HN Other Clinician: Referring Provider: Treating Provider/Extender: Barbie Banner in Treatment: 0 History of Present Illness HPI Description: ADMISSION 12/31/2020 This is a 50 year old woman with poorly controlled type 2 diabetes on insulin. Her problem began after a fall in late April. She developed a pimple area on her left buttock that rapidly expanded. She was admitted to hospital from 12/28/2020 through 01/19/2021 initially I believed to [facility] subsequently transferred to [facility] when she developed clear septic shock. 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 Her wound culture grew grew group B strep. She required operative debridements on 6/3 and 6/6 and subsequent bedside debridements as well. I have another culture result that showed E. coli. She was discharged on cephalexin and Augmentin she is long since finished this. She is now at home using Silvadene cream covered with ABDs. Past medical history includes new onset A. fib on Eliquis type 2 diabetes on insulin with a recent hemoglobin A1c of 11.1. As far as I can tell she is eating and drinking well she is taking Ensure at home. She has had significant weight loss dating back prior to this infection I am not exactly sure of the cause here Electronic Signature(s) Signed: 01/31/2021 8:35:52 PM By: Baltazar Najjar MD Entered By: Baltazar Najjar on 01/31/2021 17:11:34 -------------------------------------------------------------------------------- Physical  Exam Details Patient Name: Date of Service: [name] [name] 01/31/2021 2:45 PM Medical Record Number: [id number] Patient Account Number: [id number] Date of Birth/Sex: Treating RN: [birth year] (50 y.o. Female) Shawn Stall Primary Care Provider: Romilda Garret HN Other Clinician: Referring Provider: Treating Provider/Extender: Barbie Banner in Treatment: 0 Constitutional Sitting or standing Blood Pressure is within target range for patient.. Pulse regular and within target range for  patient.Marland Kitchen Respirations regular, non-labored and within target range.. Temperature is normal and within the target range for the patient.Marland Kitchen Appears in no distress. Integumentary (Hair, Skin) The patient showed me a small area on her symphysis pubis that was probably a small area of folliculitis. T owards the serious superior part of her labia on the left there is a firm area she let me look at this as well I think this is probably a cyst. It is nontender I am doubtful that this is an abscess. In any case I do not think this is anything to do with the wound that we are looking at.. Notes Wound exam; the area is almost the entirety of her left buttock. Superiorly this probes very close to bone there is a deeper area here that has surface slough over bone. No debridement. This area goes very close to her anal opening. There is absolutely no hope right now of considering a wound VAC. Fortunately no obvious infection in the periwound soft tissue there was no crepitus no purulence. Most of this looks like healthy tissue. As noted the most caudal part of this wound has depth Electronic Signature(s) Signed: 01/31/2021 8:35:52 PM By: Baltazar Najjar MD Entered By: Baltazar Najjar on 01/31/2021 17:13:42 -------------------------------------------------------------------------------- Physician Orders Details Patient Name: Date of Service: [name] [name] 01/31/2021 2:45 PM Medical Record Number:  [id number] Patient Account Number: [id number] Date of Birth/Sex: Treating RN: Aug 04, 1970 (50 y.o. Female) Shawn Stall Primary Care Provider: Romilda Garret HN Other Clinician: Referring Provider: Treating Provider/Extender: Barbie Banner in Treatment: 0 Verbal / Phone Orders: No Diagnosis Coding Follow-up Appointments ppointment in 2 weeks. - Dr. Leanord Hawking Return A Bathing/ Shower/ Hygiene May shower and wash wound with soap and water. - may wash wound with soap and water. Off-Loading Turn and reposition every 2 hours Other: - only up on buttock to eat meals with then back to bed. Additional Orders / Instructions Follow Nutritious Diet - patient to eat three meals today. increase protein patient to purchase ensure or boost shakes. Two shakes a day. Wound Treatment Wound #1 - Gluteus Wound Laterality: Left Cleanser: Soap and Water 1 x Per Day/30 Days Discharge Instructions: May shower and wash wound with dial antibacterial soap and water prior to dressing change. Prim Dressing: dakin's solution 1 x Per Day/30 Days ary Discharge Instructions: moisten gauze with dakin's. pack into tunnels and undermining over wound bed. Secondary Dressing: ABD Pad, 8x10 1 x Per Day/30 Days Discharge Instructions: Apply over primary dressing as directed. Secondary Dressing: Kerlix 1 x Per Day/30 Days Discharge Instructions: use kerlix gauze moisten with dakin's apply directly to wound bed, undermining, and tunneling. Secured With: Paper Tape, 2x10 (in/yd) 1 x Per Day/30 Days Discharge Instructions: Secure dressing with tape as directed. Electronic Signature(s) Signed: 01/31/2021 6:24:11 PM By: Shawn Stall Signed: 01/31/2021 8:35:52 PM By: Baltazar Najjar MD Entered By: Shawn Stall on 01/31/2021 17:03:54 -------------------------------------------------------------------------------- Problem List Details Patient Name: Date of Service: ZAKYIA, GAGAN 01/31/2021 2:45  PM Medical Record Number: [id number] Patient Account Number: [id number] Date of Birth/Sex: Treating RN: Dec 31, 1970 (50 y.o. Female) Shawn Stall Primary Care Provider: Romilda Garret HN Other Clinician: Referring Provider: Treating Provider/Extender: Barbie Banner in Treatment: 0 Active Problems ICD-10 Encounter Code Description Active Date MDM Diagnosis T81.31XD Disruption of external operation (surgical) wound, not elsewhere classified, 01/31/2021 No Yes subsequent encounter S31.829D Unspecified open wound of left buttock, subsequent encounter 01/31/2021 No Yes M72.6 Necrotizing fasciitis 01/31/2021 No Yes E11.622 Type 2 diabetes  mellitus with other skin ulcer 01/31/2021 No Yes Inactive Problems Resolved Problems Electronic Signature(s) Signed: 01/31/2021 8:35:52 PM By: Baltazar Najjar MD Entered By: Baltazar Najjar on 01/31/2021 17:08:37 -------------------------------------------------------------------------------- Progress Note Details Patient Name: Date of Service: CA MECHILLE, VARGHESE 01/31/2021 2:45 PM Medical Record Number: [id number] Patient Account Number: [id number] Date of Birth/Sex: Treating RN: [birth year] (50 y.o. Female) Shawn Stall Primary Care Provider: Romilda Garret HN Other Clinician: Referring Provider: Treating Provider/Extender: Barbie Banner in Treatment: 0 Subjective Chief Complaint Information obtained from Patient 01/31/2021; patient is here for review of a very large surgical wound on the left buttock History of Present Illness (HPI) ADMISSION 12/31/2020 This is a 50 year old woman with poorly controlled type 2 diabetes on insulin. Her problem began after a fall in late April. She developed a pimple area on her left buttock that rapidly expanded. She was admitted to hospital from 12/28/2020 through 01/19/2021 initially I believed to [facility] subsequently transferred to [facility] when she developed clear septic shock.  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 Her wound culture grew grew group B strep. She required operative debridements on 6/3 and 6/6 and subsequent bedside debridements as well. I have another culture result that showed E. coli. She was discharged on cephalexin and Augmentin she is long since finished this. She is now at home using Silvadene cream covered with ABDs. Past medical history includes new onset A. fib on Eliquis type 2 diabetes on insulin with a recent hemoglobin A1c of 11.1. As far as I can tell she is eating and drinking well she is taking Ensure at home. She has had significant weight loss dating back prior to this infection I am not exactly sure of the cause here Patient History Information obtained from Patient. Allergies No Known Allergies Family History Cancer - Mother, Diabetes - Mother,Father, Hypertension - Mother,Father, Lung Disease - Mother, No family history of Heart Disease, Hereditary Spherocytosis, Kidney Disease, Seizures, Stroke, Thyroid Problems, Tuberculosis. Social History Current every day smoker - 2-3 cigarettes a day, Marital Status - Widowed, Alcohol Use - Never, Drug Use - No History, Caffeine Use - Daily - coffee. Medical History Cardiovascular Patient has history of Arrhythmia, Congestive Heart Failure Endocrine Patient has history of Type II Diabetes Neurologic Patient has history of Neuropathy Patient is treated with Insulin, Oral Agents. Blood sugar is tested. Medical A Surgical History Notes nd Genitourinary Acute Kidney Injury Review of Systems (ROS) Eyes Denies complaints or symptoms of Dry Eyes, Vision Changes, Glasses / Contacts. Ear/Nose/Mouth/Throat Denies complaints or symptoms of Chronic sinus problems or rhinitis. Respiratory Denies complaints or symptoms of Chronic or frequent coughs, Shortness of Breath. Gastrointestinal Denies complaints or symptoms of Frequent diarrhea, Nausea,  Vomiting. Integumentary (Skin) Complains or has symptoms of Wounds. Musculoskeletal Denies complaints or symptoms of Muscle Pain, Muscle Weakness. Psychiatric Denies complaints or symptoms of Claustrophobia, Suicidal. Objective Constitutional Sitting or standing Blood Pressure is within target range for patient.. Pulse regular and within target range for patient.Marland Kitchen Respirations regular, non-labored and within target range.. Temperature is normal and within the target range for the patient.Marland Kitchen Appears in no distress. Vitals Time Taken: 3:22 PM, Temperature: 98.5 F, Pulse: 92 bpm, Respiratory Rate: 18 breaths/min, Blood Pressure: 136/82 mmHg, Capillary Blood Glucose: 91 mg/dl. General Notes: Wound exam; the area is almost the entirety of her left buttock. Superiorly this probes very close to bone there is a deeper area here that has surface slough over bone. No debridement. This area  goes very close to her anal opening. There is absolutely no hope right now of considering a wound VAC. Fortunately no obvious infection in the periwound soft tissue there was no crepitus no purulence. Most of this looks like healthy tissue. As noted the most caudal part of this wound has depth Integumentary (Hair, Skin) The patient showed me a small area on her symphysis pubis that was probably a small area of folliculitis. T owards the serious superior part of her labia on the left there is a firm area she let me look at this as well I think this is probably a cyst. It is nontender I am doubtful that this is an abscess. In any case I do not think this is anything to do with the wound that we are looking at.. Wound #1 status is Open. Original cause of wound was Other Lesion. The date acquired was: 12/23/2020. The wound is located on the Left Gluteus. The wound measures 16cm length x 8cm width x 3cm depth; 100.531cm^2 area and 301.593cm^3 volume. There is Fat Layer (Subcutaneous Tissue) exposed. There is no undermining  noted, however, there is tunneling at 12:00 with a maximum distance of 3.2cm. There is a medium amount of serosanguineous drainage noted. The wound margin is well defined and not attached to the wound base. There is large (67-100%) red granulation within the wound bed. There is a small (1-33%) amount of necrotic tissue within the wound bed including Adherent Slough. Assessment Active Problems ICD-10 Disruption of external operation (surgical) wound, not elsewhere classified, subsequent encounter Unspecified open wound of left buttock, subsequent encounter Necrotizing fasciitis Type 2 diabetes mellitus with other skin ulcer Procedures Wound #1 Pre-procedure diagnosis of Wound #1 is an Infection - not elsewhere classified located on the Left Gluteus . There was a Chemical/Enzymatic/Mechanical debridement performed by Antonieta Iba, RN.. Other agent used was gauze and wound cleanser. There was no bleeding. The procedure was tolerated well. Post Debridement Measurements: 16cm length x 8cm width x 3cm depth; 301.593cm^3 volume. Character of Wound/Ulcer Post Debridement is stable. Post procedure Diagnosis Wound #1: Same as Pre-Procedure Plan Follow-up Appointments: Return Appointment in 2 weeks. - Dr. Leanord Hawking Bathing/ Shower/ Hygiene: May shower and wash wound with soap and water. - may wash wound with soap and water. Off-Loading: Turn and reposition every 2 hours Other: - only up on buttock to eat meals with then back to bed. Additional Orders / Instructions: Follow Nutritious Diet - patient to eat three meals today. increase protein patient to purchase ensure or boost shakes. Two shakes a day. WOUND #1: - Gluteus Wound Laterality: Left Cleanser: Soap and Water 1 x Per Day/30 Days Discharge Instructions: May shower and wash wound with dial antibacterial soap and water prior to dressing change. Prim Dressing: dakin's solution 1 x Per Day/30 Days ary Discharge Instructions: moisten gauze  with dakin's. pack into tunnels and undermining over wound bed. Secondary Dressing: ABD Pad, 8x10 1 x Per Day/30 Days Discharge Instructions: Apply over primary dressing as directed. Secondary Dressing: Kerlix 1 x Per Day/30 Days Discharge Instructions: use kerlix gauze moisten with dakin's apply directly to wound bed, undermining, and tunneling. Secured With: Paper T ape, 2x10 (in/yd) 1 x Per Day/30 Days Discharge Instructions: Secure dressing with tape as directed. 1. Huge wound over the left gluteal. Fortunately the tissue here actually looks fairly good and there is been some filling in with what looks to be decent granulation tissue. Except for the most caudal part of the wound everything appears  to becoming more superficial. 2. She has Medicaid of course will not pay for wound care products and to be truthful the area of this wound would preclude almost all products. We elected therefore to use Dakin's quarter strength wet-to-dry change daily. They will continue with her ABDs 3. The area of folliculitis in the symphysis pubis is benign. This cyst underneath this is more towards her labia. Nontender I do not think this is infectious. I recommended simple follow-up of this over time. Certainly if it enlarges it may need to have medical attention 4. The patient has had a significant weight loss I am not sure of all of this was related to her most recent infection or not. 5. [facility] [place] her on doxycycline I am not exactly sure why. There was nothing about this currently that looked infected nevertheless I did not alter the antibiotic. No cultures were felt to be necessary. I spent 45 minutes in review of this patient's past medical history, face-to-face evaluation and preparation of this record Electronic Signature(s) Signed: 01/31/2021 8:35:52 PM By: Baltazar Najjarobson, Amazing Cowman MD Entered By: Baltazar Najjarobson, Julianah Marciel on 01/31/2021  17:17:47 -------------------------------------------------------------------------------- HxROS Details Patient Name: Date of Service: CA Toniann FailSSIDY, Kezia 01/31/2021 2:45 PM Medical Record Number: [id number] Patient Account Number: [id number] Date of Birth/Sex: Treating RN: 24-Jul-1971 (50 y.o. Female) Antonieta IbaBarnhart, Jodi Primary Care Provider: Romilda GarretMURPHY, JO HN Other Clinician: Referring Provider: Treating Provider/Extender: Barbie Bannerobson, Rahn Lacuesta Williams, Dwight Weeks in Treatment: 0 Information Obtained From Patient Eyes Complaints and Symptoms: Negative for: Dry Eyes; Vision Changes; Glasses / Contacts Ear/Nose/Mouth/Throat Complaints and Symptoms: Negative for: Chronic sinus problems or rhinitis Respiratory Complaints and Symptoms: Negative for: Chronic or frequent coughs; Shortness of Breath Gastrointestinal Complaints and Symptoms: Negative for: Frequent diarrhea; Nausea; Vomiting Integumentary (Skin) Complaints and Symptoms: Positive for: Wounds Musculoskeletal Complaints and Symptoms: Negative for: Muscle Pain; Muscle Weakness Psychiatric Complaints and Symptoms: Negative for: Claustrophobia; Suicidal Hematologic/Lymphatic Cardiovascular Medical History: Positive for: Arrhythmia; Congestive Heart Failure Endocrine Medical History: Positive for: Type II Diabetes Time with diabetes: 16 years Treated with: Insulin, Oral agents Blood sugar tested every day: Yes Tested : AC and HS Genitourinary Medical History: Past Medical History Notes: Acute Kidney Injury Immunological Neurologic Medical History: Positive for: Neuropathy Oncologic Immunizations Pneumococcal Vaccine: Received Pneumococcal Vaccination: No Implantable Devices None Family and Social History Cancer: Yes - Mother; Diabetes: Yes - Mother,Father; Heart Disease: No; Hereditary Spherocytosis: No; Hypertension: Yes - Mother,Father; Kidney Disease: No; Lung Disease: Yes - Mother; Seizures: No; Stroke: No; Thyroid  Problems: No; Tuberculosis: No; Current every day smoker - 2-3 cigarettes a day; Marital Status - Widowed; Alcohol Use: Never; Drug Use: No History; Caffeine Use: Daily - coffee; Financial Concerns: No; Food, Clothing or Shelter Needs: No; Support System Lacking: No; Transportation Concerns: No Electronic Signature(s) Signed: 01/31/2021 5:53:02 PM By: Antonieta IbaBarnhart, Jodi Signed: 01/31/2021 8:35:52 PM By: Baltazar Najjarobson, Amayia Ciano MD Entered By: Antonieta IbaBarnhart, Jodi on 01/31/2021 15:29:27 -------------------------------------------------------------------------------- SuperBill Details Patient Name: Date of Service: Elayne GuerinCA SSIDY, Astrid 01/31/2021 Medical Record Number: [id number] Patient Account Number: [id number] Date of Birth/Sex: Treating RN: 24-Jul-1971 (50 y.o. Female) Shawn Stalleaton, Bobbi Primary Care Provider: Romilda GarretMURPHY, JO HN Other Clinician: Referring Provider: Treating Provider/Extender: Barbie Bannerobson, Itxel Wickard Williams, Dwight Weeks in Treatment: 0 Diagnosis Coding ICD-10 Codes Code Description T81.31XD Disruption of external operation (surgical) wound, not elsewhere classified, subsequent encounter S31.829D Unspecified open wound of left buttock, subsequent encounter M72.6 Necrotizing fasciitis E11.622 Type 2 diabetes mellitus with other skin ulcer Facility Procedures CPT4 Code: [id number] Description: 99213 - WOUND  CARE VISIT-LEV 3 EST PT Modifier: Quantity: 1 CPT4 Code: [id number] Description: 54098 - DEBRIDE W/O ANES NON SELECT Modifier: Quantity: 1 Physician Procedures : CPT4 Code Description Modifier [id number] 99204 - WC PHYS LEVEL 4 - NEW PT ICD-10 Diagnosis Description T81.31XD Disruption of external operation (surgical) wound, not elsewhere classified, subsequent encounter S31.829D Unspecified open wound of left  buttock, subsequent encounter M72.6 Necrotizing fasciitis E11.622 Type 2 diabetes mellitus with other skin ulcer Quantity: 1 Electronic Signature(s) Signed: 01/31/2021 8:35:52 PM By: Baltazar Najjar  MD Entered By: Baltazar Najjar on 01/31/2021 17:18:11

## 2021-01-31 NOTE — Progress Notes (Signed)
[name], [name] ([id number]) Visit Report for 01/31/2021 Abuse/Suicide Risk Screen Details Patient Name: Date of Service: [name] [name], [name] [name] 01/31/2021 2:45 PM Medical Record Number: [id number] Patient Account Number: [id number] Date of Birth/Sex: Treating RN: January 06, 1971 (50 y.o. Female) Antonieta Iba Primary Care Bethel Sirois: Romilda Garret HN Other Clinician: Referring Edrees Valent: Treating Adonai Selsor/Extender: Barbie Banner in Treatment: 0 Abuse/Suicide Risk Screen Items Answer ABUSE RISK SCREEN: Has anyone close to you tried to hurt or harm you recentlyo No Do you feel uncomfortable with anyone in your familyo No Has anyone forced you do things that you didnt want to doo No Electronic Signature(s) Signed: 01/31/2021 5:53:02 PM By: Antonieta Iba Entered By: Antonieta Iba on 01/31/2021 15:30:07 -------------------------------------------------------------------------------- Activities of Daily Living Details Patient Name: Date of Service: [name] [name], [name] [name] 01/31/2021 2:45 PM Medical Record Number: [id number] Patient Account Number: [id number] Date of Birth/Sex: Treating RN: 1971-05-21 (50 y.o. Female) Antonieta Iba Primary Care Ceilidh Torregrossa: Romilda Garret HN Other Clinician: Referring Rashon Westrup: Treating Victorious Cosio/Extender: Barbie Banner in Treatment: 0 Activities of Daily Living Items Answer Activities of Daily Living (Please select one for each item) Drive Automobile Not Able T Medications ake Completely Able Use T elephone Completely Able Care for Appearance Need Assistance Use T oilet Need Assistance Bath / Shower Need Assistance Dress Self Need Assistance Feed Self Completely Able Walk Need Assistance Get In / Out Bed Completely Able Housework Need Assistance Prepare Meals Need Assistance Handle Money Completely Able Shop for Self Need Assistance Electronic Signature(s) Signed: 01/31/2021 5:53:02 PM By: Antonieta Iba Entered By:  Antonieta Iba on 01/31/2021 15:30:50 -------------------------------------------------------------------------------- Education Screening Details Patient Name: Date of Service: [name] [name], [name] [name] 01/31/2021 2:45 PM Medical Record Number: [id number] Patient Account Number: [id number] Date of Birth/Sex: Treating RN: [birth year] (50 y.o. Female) Antonieta Iba Primary Care Esiah Bazinet: Romilda Garret HN Other Clinician: Referring Misa Fedorko: Treating Salina Stanfield/Extender: Barbie Banner in Treatment: 0 Primary Learner Assessed: Patient Learning Preferences/Education Level/Primary Language Learning Preference: Explanation, Demonstration, Printed Material Highest Education Level: High School Preferred Language: English Cognitive Barrier Language Barrier: No Translator Needed: No Memory Deficit: No Emotional Barrier: No Cultural/Religious Beliefs Affecting Medical Care: No Physical Barrier Impaired Vision: No Impaired Hearing: No Decreased Hand dexterity: No Knowledge/Comprehension Knowledge Level: High Comprehension Level: Medium Ability to understand written instructions: Medium Ability to understand verbal instructions: Medium Motivation Anxiety Level: Calm Cooperation: Cooperative Education Importance: Acknowledges Need Interest in Health Problems: Asks Questions Perception: Coherent Willingness to Engage in Self-Management High Activities: Readiness to Engage in Self-Management High Activities: Electronic Signature(s) Signed: 01/31/2021 5:53:02 PM By: Antonieta Iba Entered By: Antonieta Iba on 01/31/2021 15:31:43 -------------------------------------------------------------------------------- Fall Risk Assessment Details Patient Name: Date of Service: [name] [name], [name] [name] 01/31/2021 2:45 PM Medical Record Number: [id number] Patient Account Number: [id number] Date of Birth/Sex: Treating RN: [birth year] (50 y.o. Female) Antonieta Iba Primary Care Candus Braud:  Romilda Garret HN Other Clinician: Referring Nikkol Pai: Treating Dedee Liss/Extender: Barbie Banner in Treatment: 0 Fall Risk Assessment Items Have you had 2 or more falls in the last 12 monthso 0 No Have you had any fall that resulted in injury in the last 12 monthso 0 No FALLS RISK SCREEN History of falling - immediate or within 3 months 25 Yes Secondary diagnosis (Do you have 2 or more medical diagnoseso) 0 No Ambulatory aid None/bed rest/wheelchair/nurse 0 No Crutches/cane/walker 15 Yes Furniture 0 No Intravenous therapy Access/Saline/Heparin Lock 0 No Gait/Transferring Normal/ bed rest/ wheelchair 0 No Weak (short steps with or without shuffle, stooped but able to  lift head while walking, may seek 10 Yes support from furniture) Impaired (short steps with shuffle, may have difficulty arising from chair, head down, impaired 0 No balance) Mental Status Oriented to own ability 0 Yes Electronic Signature(s) Signed: 01/31/2021 5:53:02 PM By: Antonieta Iba Entered By: Antonieta Iba on 01/31/2021 15:33:06 -------------------------------------------------------------------------------- Foot Assessment Details Patient Name: Date of Service: [name] [name], [name] [name] 01/31/2021 2:45 PM Medical Record Number: [id number] Patient Account Number: [id number] Date of Birth/Sex: Treating RN: 1970/11/03 (50 y.o. Female) Antonieta Iba Primary Care Calia Napp: Romilda Garret HN Other Clinician: Referring Leshawn Houseworth: Treating Aydien Majette/Extender: Barbie Banner in Treatment: 0 Foot Assessment Items Site Locations + = Sensation present, - = Sensation absent, C = Callus, U = Ulcer R = Redness, W = Warmth, M = Maceration, PU = Pre-ulcerative lesion F = Fissure, S = Swelling, D = Dryness Assessment Right: Left: Other Deformity: No No Prior Foot Ulcer: No No Prior Amputation: No No Charcot Joint: No No Ambulatory Status: Ambulatory With Help Assistance Device:  Walker Gait: Steady Notes N/A=Buttock Wound Electronic Signature(s) Signed: 01/31/2021 5:53:02 PM By: Antonieta Iba Entered By: Antonieta Iba on 01/31/2021 15:32:17 -------------------------------------------------------------------------------- Nutrition Risk Screening Details Patient Name: Date of ServiceKELISE, [name] [name] 01/31/2021 2:45 PM Medical Record Number: [id number] Patient Account Number: [id number] Date of Birth/Sex: Treating RN: [birth year] (50 y.o. Female) Antonieta Iba Primary Care Donnis Pecha: Romilda Garret HN Other Clinician: Referring Leena Tiede: Treating Andreina Outten/Extender: Barbie Banner in Treatment: 0 Height (in): Weight (lbs): Body Mass Index (BMI): Nutrition Risk Screening Items Score Screening NUTRITION RISK SCREEN: I have an illness or condition that made me change the kind and/or amount of food I eat 0 No I eat fewer than two meals per day 0 No I eat few fruits and vegetables, or milk products 0 No I have three or more drinks of beer, liquor or wine almost every day 0 No I have tooth or mouth problems that make it hard for me to eat 0 No I don't always have enough money to buy the food I need 0 No I eat alone most of the time 0 No I take three or more different prescribed or over-the-counter drugs a day 1 Yes Without wanting to, I have lost or gained 10 pounds in the last six months 0 No I am not always physically able to shop, cook and/or feed myself 0 No Nutrition Protocols Good Risk Protocol 0 No interventions needed Moderate Risk Protocol High Risk Proctocol Risk Level: Good Risk Score: 1 Electronic Signature(s) Signed: 01/31/2021 5:53:02 PM By: Antonieta Iba Entered By: Antonieta Iba on 01/31/2021 15:33:26

## 2021-02-01 ENCOUNTER — Encounter (HOSPITAL_BASED_OUTPATIENT_CLINIC_OR_DEPARTMENT_OTHER): Payer: Medicaid Other | Admitting: Internal Medicine

## 2021-02-01 NOTE — Progress Notes (Signed)
[name] [name], [place] ([id number]) Visit Report for 01/31/2021 Allergy List Details Patient Name: Date of Service: [name] [name], [name] [name] 01/31/2021 2:45 PM Medical Record Number: [id number] Patient Account Number: [id number] Date of Birth/Sex: Treating RN: Mar 10, 1971 (50 y.o. Roel CluckF) Barnhart, Jodi Primary Care Linkyn Gobin: Romilda GarretMURPHY, JO HN Other Clinician: Referring Darnell Jeschke: Treating Drew [name]/Extender: Hoyt Kochobson, Michael Williams, Dwight Weeks in Treatment: 0 Allergies Active Allergies No Known Allergies Allergy Notes Electronic Signature(s) Signed: 01/31/2021 5:53:02 PM By: Antonieta IbaBarnhart, Jodi Entered By: Antonieta IbaBarnhart, Jodi on 01/31/2021 15:23:13 -------------------------------------------------------------------------------- Arrival Information Details Patient Name: Date of Service: [name] Arnoldo HookerSSIDY, November 01/31/2021 2:45 PM Medical Record Number: [id number] Patient Account Number: [id number] Date of Birth/Sex: Treating RN: Mar 10, 1971 (50 y.o. Roel CluckF) Barnhart, Jodi Primary Care Jihad Brownlow: Romilda GarretMURPHY, JO HN Other Clinician: Referring Janaa Acero: Treating Jonia Oakey/Extender: Barbie Bannerobson, Michael Williams, Dwight Weeks in Treatment: 0 Visit Information Patient Arrived: Cloyde ReamsWalker Arrival Time: 15:18 Transfer Assistance: None Patient Identification Verified: Yes Secondary Verification Process Completed: Yes Patient Requires Transmission-Based Precautions: No Patient Has Alerts: Yes Patient Alerts: Patient on Blood Thinner Electronic Signature(s) Signed: 01/31/2021 5:53:02 PM By: Antonieta IbaBarnhart, Jodi Entered By: Antonieta IbaBarnhart, Jodi on 01/31/2021 15:22:11 -------------------------------------------------------------------------------- Clinic Level of Care Assessment Details Patient Name: Date of Service: [name] [name], [name] [name] 01/31/2021 2:45 PM Medical Record Number: [id number] Patient Account Number: [id number] Date of Birth/Sex: Treating RN: Mar 10, 1971 (50 y.o. Arta SilenceF) Deaton, Bobbi Primary Care Jatin Naumann: Romilda GarretMURPHY, JO HN Other Clinician: Referring  Corrie Reder: Treating Jentri Aye/Extender: Barbie Bannerobson, Michael Williams, Dwight Weeks in Treatment: 0 Clinic Level of Care Assessment Items TOOL 1 Quantity Score X- 1 0 Use when EandM and Procedure is performed on INITIAL visit ASSESSMENTS - Nursing Assessment / Reassessment X- 1 20 General Physical Exam (combine w/ comprehensive assessment (listed just below) when performed on new pt. evals) X- 1 25 Comprehensive Assessment (HX, ROS, Risk Assessments, Wounds Hx, etc.) ASSESSMENTS - Wound and Skin Assessment / Reassessment X- 1 10 Dermatologic / Skin Assessment (not related to wound area) ASSESSMENTS - Ostomy and/or Continence Assessment and Care []  - 0 Incontinence Assessment and Management []  - 0 Ostomy Care Assessment and Management (repouching, etc.) PROCESS - Coordination of Care []  - 0 Simple Patient / Family Education for ongoing care X- 1 20 Complex (extensive) Patient / Family Education for ongoing care X- 1 10 Staff obtains ChiropractorConsents, Records, T Results / Process Orders est []  - 0 Staff telephones HHA, Nursing Homes / Clarify orders / etc []  - 0 Routine Transfer to another Facility (non-emergent condition) []  - 0 Routine Hospital Admission (non-emergent condition) X- 1 15 New Admissions / Manufacturing engineernsurance Authorizations / Ordering NPWT Apligraf, etc. , []  - 0 Emergency Hospital Admission (emergent condition) PROCESS - Special Needs []  - 0 Pediatric / Minor Patient Management []  - 0 Isolation Patient Management []  - 0 Hearing / Language / Visual special needs []  - 0 Assessment of Community assistance (transportation, D/C planning, etc.) []  - 0 Additional assistance / Altered mentation []  - 0 Support Surface(s) Assessment (bed, cushion, seat, etc.) INTERVENTIONS - Miscellaneous []  - 0 External ear exam []  - 0 Patient Transfer (multiple staff / Nurse, adultHoyer Lift / Similar devices) []  - 0 Simple Staple / Suture removal (25 or less) []  - 0 Complex Staple / Suture removal (26  or more) []  - 0 Hypo/Hyperglycemic Management (do not check if billed separately) []  - 0 Ankle / Brachial Index (ABI) - do not check if billed separately Has the patient been seen at the hospital within the last three years: Yes Total Score: 100 Level Of Care: New/Established - Level 3 Electronic Signature(s)  Signed: 01/31/2021 6:24:11 PM By: Shawn Stall Entered By: Shawn Stall on 01/31/2021 17:09:18 -------------------------------------------------------------------------------- Encounter Discharge Information Details Patient Name: Date of Service: [name] [name], [name] [name] 01/31/2021 2:45 PM Medical Record Number: [id number] Patient Account Number: [id number] Date of Birth/Sex: Treating RN: 1970/11/11 (50 y.o. Ardis Rowan, [name] Primary Care Kristell Wooding: Romilda Garret HN Other Clinician: Referring Dresden Lozito: Treating Searra Carnathan/Extender: Barbie Banner in Treatment: 0 Encounter Discharge Information Items Post Procedure Vitals Discharge Condition: Stable Temperature (F): 97.4 Ambulatory Status: Walker Pulse (bpm): 74 Discharge Destination: Home Respiratory Rate (breaths/min): 17 Transportation: Private Auto Blood Pressure (mmHg): 147/74 Accompanied By: self Schedule Follow-up Appointment: Yes Clinical Summary of Care: Patient Declined Electronic Signature(s) Signed: 02/01/2021 5:38:08 PM By: Fonnie Mu RN Entered By: Fonnie Mu on 01/31/2021 17:51:59 -------------------------------------------------------------------------------- Lower Extremity Assessment Details Patient Name: Date of Service: [name] [name], [name] [name] 01/31/2021 2:45 PM Medical Record Number: [id number] Patient Account Number: [id number] Date of Birth/Sex: Treating RN: [birth year] (50 y.o. Roel Cluck Primary Care Lorriann Hansmann: Romilda Garret HN Other Clinician: Referring Rosita Guzzetta: Treating Jarrah Seher/Extender: Barbie Banner in Treatment: 0 Electronic  Signature(s) Signed: 01/31/2021 5:53:02 PM By: Antonieta Iba Entered By: Antonieta Iba on 01/31/2021 15:32:24 -------------------------------------------------------------------------------- Multi Wound Chart Details Patient Name: Date of Service: [name] [name], [name] [name] 01/31/2021 2:45 PM Medical Record Number: [id number] Patient Account Number: [id number] Date of Birth/Sex: Treating RN: [birth year] (50 y.o. Debara Pickett, Millard.Loa Primary Care Saxon Crosby: Romilda Garret HN Other Clinician: Referring Lizbet Cirrincione: Treating Varvara Legault/Extender: Barbie Banner in Treatment: 0 Vital Signs Height(in): Capillary Blood Glucose(mg/dl): 91 Weight(lbs): Pulse(bpm): 92 Body Mass Index(BMI): Blood Pressure(mmHg): 136/82 Temperature(F): 98.5 Respiratory Rate(breaths/min): 18 Photos: [1:No Photos Left Gluteus] [N/A:N/A N/A] Wound Location: [1:Other Lesion] [N/A:N/A] Wounding Event: [1:Infection - not elsewhere classified N/A] Primary Etiology: [1:Arrhythmia, Congestive Heart Failure, N/A] Comorbid History: [1:Type II Diabetes, Neuropathy 12/23/2020] [N/A:N/A] Date Acquired: [1:0] [N/A:N/A] Weeks of Treatment: [1:Open] [N/A:N/A] Wound Status: [1:16x8x3] [N/A:N/A] Measurements L x W x D (cm) [1:100.531] [N/A:N/A] A (cm) : rea [1:301.593] [N/A:N/A] Volume (cm) : [1:12] Position 1 (o'clock): [1:3.2] Maximum Distance 1 (cm): [1:Yes] [N/A:N/A] Tunneling: [1:Full Thickness Without Exposed] [N/A:N/A] Classification: [1:Support Structures Medium] [N/A:N/A] Exudate A mount: [1:Serosanguineous] [N/A:N/A] Exudate Type: [1:red, brown] [N/A:N/A] Exudate Color: [1:Well defined, not attached] [N/A:N/A] Wound Margin: [1:Large (67-100%)] [N/A:N/A] Granulation A mount: [1:Red] [N/A:N/A] Granulation Quality: [1:Small (1-33%)] [N/A:N/A] Necrotic A mount: [1:Fat Layer (Subcutaneous Tissue): Yes N/A] Exposed Structures: [1:Fascia: No Tendon: No Muscle: No Joint: No Bone: No None]  [N/A:N/A] Epithelialization: [1:Chemical/Enzymatic/Mechanical] [N/A:N/A] Debridement: [1:N/A] [N/A:N/A] Instrument: [1:None] [N/A:N/A] Bleeding: Debridement Treatment Response: Procedure was tolerated well [N/A:N/A] Post Debridement Measurements L x 16x8x3 [N/A:N/A] W x D (cm) [1:301.593] [N/A:N/A] Post Debridement Volume: (cm) [1:Debridement] [N/A:N/A] Treatment Notes Electronic Signature(s) Signed: 01/31/2021 6:24:11 PM By: Shawn Stall Signed: 01/31/2021 8:35:52 PM By: Baltazar Najjar MD Entered By: Baltazar Najjar on 01/31/2021 17:08:46 -------------------------------------------------------------------------------- Multi-Disciplinary Care Plan Details Patient Name: Date of Service: [name] [name], [name] [name] 01/31/2021 2:45 PM Medical Record Number: [id number] Patient Account Number: [id number] Date of Birth/Sex: Treating RN: [birth year] (50 y.o. [name] Primary Care Deanta Mincey: Romilda Garret HN Other Clinician: Referring Cassiopeia Florentino: Treating Bitha Fauteux/Extender: Barbie Banner in Treatment: 0 Active Inactive Nutrition Nursing Diagnoses: Potential for alteratiion in Nutrition/Potential for imbalanced nutrition Goals: Patient/caregiver agrees to and verbalizes understanding of need to obtain nutritional consultation Date Initiated: 01/31/2021 Target Resolution Date: 03/01/2021 Goal Status: Active Interventions: Provide education on nutrition Treatment Activities: Patient referred to Primary Care Physician for further nutritional evaluation : 01/31/2021 Notes: Orientation to  the Wound Care Program Nursing Diagnoses: Knowledge deficit related to the wound healing center program Goals: Patient/caregiver will verbalize understanding of the Wound Healing Center Program Date Initiated: 01/31/2021 Target Resolution Date: 03/01/2021 Goal Status: Active Interventions: Provide education on orientation to the wound center Notes: Pain, Acute or Chronic Nursing  Diagnoses: Pain, acute or chronic: actual or potential Potential alteration in comfort, pain Goals: Patient will verbalize adequate pain control and receive pain control interventions during procedures as needed Date Initiated: 01/31/2021 Target Resolution Date: 03/01/2021 Goal Status: Active Interventions: Encourage patient to take pain medications as prescribed Provide education on pain management Reposition patient for comfort Treatment Activities: Administer pain control measures as ordered : 01/31/2021 Notes: Wound/Skin Impairment Nursing Diagnoses: Knowledge deficit related to ulceration/compromised skin integrity Goals: Patient/caregiver will verbalize understanding of skin care regimen Date Initiated: 01/31/2021 Target Resolution Date: 02/28/2021 Goal Status: Active Interventions: Assess patient/caregiver ability to obtain necessary supplies Assess patient/caregiver ability to perform ulcer/skin care regimen upon admission and as needed Provide education on ulcer and skin care Treatment Activities: Skin care regimen initiated : 01/31/2021 Topical wound management initiated : 01/31/2021 Notes: Electronic Signature(s) Signed: 01/31/2021 6:24:11 PM By: Shawn Stall Entered By: Shawn Stall on 01/31/2021 16:03:14 -------------------------------------------------------------------------------- Pain Assessment Details Patient Name: Date of Service: [name] [name], [name] [name] 01/31/2021 2:45 PM Medical Record Number: [id number] Patient Account Number: [id number] Date of Birth/Sex: Treating RN: 09/16/1970 (50 y.o. Roel Cluck Primary Care Kaliq Lege: Romilda Garret HN Other Clinician: Referring Medardo Hassing: Treating Ibrahim Mcpheeters/Extender: Barbie Banner in Treatment: 0 Active Problems Location of Pain Severity and Description of Pain Patient Has Paino Yes Site Locations Pain Location: Pain in Ulcers With Dressing Change: Yes Duration of the Pain. Constant /  Intermittento Intermittent Rate the pain. Current Pain Level: 10 Character of Pain Describe the Pain: Shooting, Stabbing, Throbbing Pain Management and Medication Current Pain Management: Medication: Yes Cold Application: No Rest: Yes Massage: No Activity: No T.E.N.S.: No Heat Application: No Leg drop or elevation: No Is the Current Pain Management Adequate: Inadequate How does your wound impact your activities of daily livingo Sleep: Yes Bathing: No Appetite: No Relationship With Others: No Bladder Continence: No Emotions: No Bowel Continence: No Work: No Toileting: No Drive: No Dressing: No Hobbies: No Electronic Signature(s) Signed: 01/31/2021 5:53:02 PM By: Antonieta Iba Entered By: Antonieta Iba on 01/31/2021 15:35:54 -------------------------------------------------------------------------------- Patient/Caregiver Education Details Patient Name: Date of Service: [name] [name], [name] [name] 7/7/2022andnbsp2:45 PM Medical Record Number: [id number] Patient Account Number: [id number] Date of Birth/Gender: Treating RN: July 01, 1971 (50 y.o. [name] Primary Care Physician: Romilda Garret HN Other Clinician: Referring Physician: Treating Physician/Extender: Barbie Banner in Treatment: 0 Education Assessment Education Provided To: Patient Education Topics Provided Nutrition: Handouts: Elevated Blood Sugars: How Do They Affect Wound Healing, Nutrition Methods: Explain/Verbal, Printed Responses: Reinforcements needed Welcome T [facility]: o Handouts: Welcome T [facility] o Methods: Explain/Verbal, Printed Responses: Reinforcements needed Electronic Signature(s) Signed: 01/31/2021 6:24:11 PM By: Shawn Stall Entered By: Shawn Stall on 01/31/2021 16:18:31 -------------------------------------------------------------------------------- Wound Assessment Details Patient Name: Date of Service: [name] [name], [name] [name] 01/31/2021  2:45 PM Medical Record Number: [id number] Patient Account Number: [id number] Date of Birth/Sex: Treating RN: 04/01/1971 (50 y.o. Roel Cluck Primary Care Margues Filippini: Romilda Garret HN Other Clinician: Referring Jayce Kainz: Treating Jewelianna Pancoast/Extender: Barbie Banner in Treatment: 0 Wound Status Wound Number: 1 Primary Infection - not elsewhere classified Etiology: Wound Location: Left Gluteus Wound Status: Open Wounding Event: Other Lesion Comorbid Arrhythmia, Congestive  Heart Failure, Type II Diabetes, Date Acquired: 12/23/2020 History: Neuropathy Weeks Of Treatment: 0 Clustered Wound: No Photos Wound Measurements Length: (cm) 16 Width: (cm) 8 Depth: (cm) 3 Area: (cm) 100.531 Volume: (cm) 301.593 % Reduction in Area: 0% % Reduction in Volume: 0% Epithelialization: None Tunneling: Yes Position (o'clock): 12 Maximum Distance: (cm) 3.2 Undermining: No Wound Description Classification: Full Thickness Without Exposed Support Struct Wound Margin: Well defined, not attached Exudate Amount: Medium Exudate Type: Serosanguineous Exudate Color: red, brown ures Foul Odor After Cleansing: No Slough/Fibrino Yes Wound Bed Granulation Amount: Large (67-100%) Exposed Structure Granulation Quality: Red Fascia Exposed: No Necrotic Amount: Small (1-33%) Fat Layer (Subcutaneous Tissue) Exposed: Yes Necrotic Quality: Adherent Slough Tendon Exposed: No Muscle Exposed: No Joint Exposed: No Bone Exposed: No Treatment Notes Wound #1 (Gluteus) Wound Laterality: Left Cleanser Soap and Water Discharge Instruction: May shower and wash wound with dial antibacterial soap and water prior to dressing change. Peri-Wound Care Topical Primary Dressing dakin's solution Discharge Instruction: moisten gauze with dakin's. pack into tunnels and undermining over wound bed. Secondary Dressing ABD Pad, 8x10 Discharge Instruction: Apply over primary dressing as  directed. Kerlix Discharge Instruction: use kerlix gauze moisten with dakin's apply directly to wound bed, undermining, and tunneling. Secured With Paper Tape, 2x10 (in/yd) Discharge Instruction: Secure dressing with tape as directed. Compression Wrap Compression Stockings Add-Ons Electronic Signature(s) Signed: 02/01/2021 5:16:50 PM By: Karl Ito Signed: 02/01/2021 5:49:30 PM By: Antonieta Iba Previous Signature: 01/31/2021 5:53:02 PM Version By: Antonieta Iba Entered By: Karl Ito on 02/01/2021 15:10:33 -------------------------------------------------------------------------------- Vitals Details Patient Name: Date of Service: [name] [name], [name] [name] 01/31/2021 2:45 PM Medical Record Number: [id number] Patient Account Number: [id number] Date of Birth/Sex: Treating RN: [birth year] (50 y.o. Roel Cluck Primary Care Wyoma Genson: Romilda Garret HN Other Clinician: Referring Karsten Howry: Treating [place] Prout/Extender: Barbie Banner in Treatment: 0 Vital Signs Time Taken: 15:22 Temperature (F): 98.5 Pulse (bpm): 92 Respiratory Rate (breaths/min): 18 Blood Pressure (mmHg): 136/82 Capillary Blood Glucose (mg/dl): 91 Reference Range: 80 - 120 mg / dl Electronic Signature(s) Signed: 01/31/2021 5:53:02 PM By: Antonieta Iba Entered By: Antonieta Iba on 01/31/2021 15:23:01

## 2021-02-14 ENCOUNTER — Encounter (HOSPITAL_BASED_OUTPATIENT_CLINIC_OR_DEPARTMENT_OTHER): Payer: Medicaid Other | Admitting: Internal Medicine

## 2021-02-20 ENCOUNTER — Encounter (HOSPITAL_BASED_OUTPATIENT_CLINIC_OR_DEPARTMENT_OTHER): Payer: Medicaid Other | Admitting: Internal Medicine

## 2021-03-11 ENCOUNTER — Other Ambulatory Visit: Payer: Self-pay

## 2021-03-11 ENCOUNTER — Encounter (HOSPITAL_BASED_OUTPATIENT_CLINIC_OR_DEPARTMENT_OTHER): Payer: Medicaid Other | Attending: Internal Medicine | Admitting: Internal Medicine

## 2021-03-11 DIAGNOSIS — Y838 Other surgical procedures as the cause of abnormal reaction of the patient, or of later complication, without mention of misadventure at the time of the procedure: Secondary | ICD-10-CM | POA: Insufficient documentation

## 2021-03-11 DIAGNOSIS — S31829D Unspecified open wound of left buttock, subsequent encounter: Secondary | ICD-10-CM | POA: Diagnosis not present

## 2021-03-11 DIAGNOSIS — Z794 Long term (current) use of insulin: Secondary | ICD-10-CM | POA: Diagnosis not present

## 2021-03-11 DIAGNOSIS — T8131XA Disruption of external operation (surgical) wound, not elsewhere classified, initial encounter: Secondary | ICD-10-CM | POA: Insufficient documentation

## 2021-03-11 DIAGNOSIS — I4891 Unspecified atrial fibrillation: Secondary | ICD-10-CM | POA: Diagnosis not present

## 2021-03-11 DIAGNOSIS — M726 Necrotizing fasciitis: Secondary | ICD-10-CM | POA: Insufficient documentation

## 2021-03-11 DIAGNOSIS — E11622 Type 2 diabetes mellitus with other skin ulcer: Secondary | ICD-10-CM | POA: Diagnosis not present

## 2021-03-11 DIAGNOSIS — Z7901 Long term (current) use of anticoagulants: Secondary | ICD-10-CM | POA: Diagnosis not present

## 2021-03-11 DIAGNOSIS — S31829A Unspecified open wound of left buttock, initial encounter: Secondary | ICD-10-CM | POA: Insufficient documentation

## 2021-03-11 DIAGNOSIS — T8131XD Disruption of external operation (surgical) wound, not elsewhere classified, subsequent encounter: Secondary | ICD-10-CM | POA: Diagnosis not present

## 2021-03-11 NOTE — Progress Notes (Signed)
[name] [name], [name] [name] ([id number]) Visit Report for 03/11/2021 Chief Complaint Document Details Patient Name: Date of Service: [name] [name], [name] [name] 03/11/2021 12:30 PM Medical Record Number: [id number] Patient Account Number: [id number] Date of Birth/Sex: Treating RN: November 16, 1970 (50 y.o. Roel Cluck Primary Care Provider: Theophilus Kinds Other Clinician: Referring Provider: Treating Provider/Extender: Curt Bears in Treatment: 5 Information Obtained from: Patient Chief Complaint 01/31/2021; patient is here for review of a very large surgical wound on the left buttock Electronic Signature(s) Signed: 03/11/2021 1:49:12 PM By: Geralyn Corwin DO Entered By: Geralyn Corwin on 03/11/2021 13:41:10 -------------------------------------------------------------------------------- HPI Details Patient Name: Date of Service: [name] [name], [name] [name] 03/11/2021 12:30 PM Medical Record Number: [id number] Patient Account Number: [id number] Date of Birth/Sex: Treating RN: August 05, 1970 (50 y.o. Roel Cluck Primary Care Provider: Theophilus Kinds Other Clinician: Referring Provider: Treating Provider/Extender: Curt Bears in Treatment: 5 History of Present Illness HPI Description: ADMISSION 12/31/2020 This is a 50 year old woman with poorly controlled type 2 diabetes on insulin. Her problem began after a fall in late April. She developed a pimple area on her left buttock that rapidly expanded. She was admitted to hospital from 12/28/2020 through 01/19/2021 initially I believed to [facility] subsequently transferred to [facility] when she developed clear septic shock. 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 Her wound culture grew grew group B strep. She required operative debridements on 6/3 and 6/6 and subsequent bedside debridements as well. I have another culture result that showed E. coli. She was discharged on cephalexin and  Augmentin she is long since finished this. She is now at home using Silvadene cream covered with ABDs. Past medical history includes new onset A. fib on Eliquis type 2 diabetes on insulin with a recent hemoglobin A1c of 11.1. As far as I can tell she is eating and drinking well she is taking Ensure at home. She has had significant weight loss dating back prior to this infection I am not exactly sure of the cause here 8/15; patient presents for follow-up. Its been 1 month since patient has last been seen. She is currently using Dakin's wet-to-dry dressings. She has no issues or complaints today. She denies signs of infection. Electronic Signature(s) Signed: 03/11/2021 1:49:12 PM By: Geralyn Corwin DO Entered By: Geralyn Corwin on 03/11/2021 13:42:01 -------------------------------------------------------------------------------- Physical Exam Details Patient Name: Date of Service: [name] [name], [name] [name] 03/11/2021 12:30 PM Medical Record Number: [id number] Patient Account Number: [id number] Date of Birth/Sex: Treating RN: 27-Mar-1971 (50 y.o. Roel Cluck Primary Care Provider: Theophilus Kinds Other Clinician: Referring Provider: Treating Provider/Extender: Curt Bears in Treatment: 5 Constitutional respirations regular, non-labored and within target range for patient.Marland Kitchen Psychiatric pleasant and cooperative. Notes Wound is localized to almost her entire left buttocks. No probing to bone. Granulation tissue throughout with no signs of infection. Electronic Signature(s) Signed: 03/11/2021 1:49:12 PM By: Geralyn Corwin DO Entered By: Geralyn Corwin on 03/11/2021 13:43:21 -------------------------------------------------------------------------------- Physician Orders Details Patient Name: Date of Service: [name] [name], [name] [name] 03/11/2021 12:30 PM Medical Record Number: [id number] Patient Account Number: [id number] Date of Birth/Sex: Treating RN: 02-02-1971 (50 y.o. Roel Cluck Primary Care Provider: Theophilus Kinds Other Clinician: Referring Provider: Treating Provider/Extender: Curt Bears in Treatment: 5 Verbal / Phone Orders: No Diagnosis Coding ICD-10 Coding Code Description T81.31XD Disruption of external operation (surgical) wound, not elsewhere classified, subsequent encounter S31.829D Unspecified open wound of left buttock, subsequent encounter M72.6 Necrotizing fasciitis E11.622 Type 2 diabetes mellitus with  other skin ulcer Follow-up Appointments ppointment in 2 weeks. - 03/11/2021 Dr. Leanord Hawking Return A Bathing/ Shower/ Hygiene May shower and wash wound with soap and water. Off-Loading Low air-loss mattress (Group 2) - continue to use. Turn and reposition every 2 hours - only up for meals and back to bed. Additional Orders / Instructions Follow Nutritious Diet - increase protein, eat three meals a day. Home Health dmit to Home Health for wound care. May utilize formulary equivalent dressing for wound treatment orders unless otherwise specified. - A [facility] for 1-2 times a week dressing changes. Patient wound is a daily dressing change. Wound Treatment Wound #1 - Gluteus Wound Laterality: Left Cleanser: Soap and Water (Home Health) 1 x Per Day/30 Days Discharge Instructions: May shower and wash wound with dial antibacterial soap and water prior to dressing change. Cleanser: Wound Cleanser (Home Health) 1 x Per Day/30 Days Discharge Instructions: Cleanse the wound with wound cleanser prior to applying a clean dressing using gauze sponges, not tissue or cotton balls. Peri-Wound Care: Skin Prep (Home Health) 1 x Per Day/30 Days Discharge Instructions: Use skin prep as directed Prim Dressing: Rolled Gauze (Kerlix) (Home Health) 1 x Per Day/30 Days ary Discharge Instructions: Moisten with Dakins and apply to wound bed. Secondary Dressing: Woven Gauze Sponges 2x2 in 1 x Per Day/30 Days Secondary  Dressing: ABD Pad, 8x10 (Home Health) 1 x Per Day/30 Days Discharge Instructions: Apply over primary dressing as directed. Secured With: 4M Medipore H Soft Cloth Surgical T 4 x 2 (in/yd) (Home Health) 1 x Per Day/30 Days ape Discharge Instructions: Secure dressing with tape as directed. Electronic Signature(s) Signed: 03/11/2021 1:49:12 PM By: Geralyn Corwin DO Entered By: Geralyn Corwin on 03/11/2021 13:43:34 -------------------------------------------------------------------------------- Problem List Details Patient Name: Date of Service: [name] [name], [name] [name] 03/11/2021 12:30 PM Medical Record Number: [id number] Patient Account Number: [id number] Date of Birth/Sex: Treating RN: 27-May-1971 (50 y.o. Roel Cluck Primary Care Provider: Theophilus Kinds Other Clinician: Referring Provider: Treating Provider/Extender: Curt Bears in Treatment: 5 Active Problems ICD-10 Encounter Code Description Active Date MDM Diagnosis T81.31XD Disruption of external operation (surgical) wound, not elsewhere classified, 01/31/2021 No Yes subsequent encounter S31.829D Unspecified open wound of left buttock, subsequent encounter 01/31/2021 No Yes M72.6 Necrotizing fasciitis 01/31/2021 No Yes E11.622 Type 2 diabetes mellitus with other skin ulcer 01/31/2021 No Yes Inactive Problems Resolved Problems Electronic Signature(s) Signed: 03/11/2021 1:49:12 PM By: Geralyn Corwin DO Previous Signature: 03/11/2021 1:04:47 PM Version By: Antonieta Iba Entered By: Geralyn Corwin on 03/11/2021 13:40:56 -------------------------------------------------------------------------------- Progress Note Details Patient Name: Date of Service: [name] [name], [name] [name] 03/11/2021 12:30 PM Medical Record Number: [id number] Patient Account Number: [id number] Date of Birth/Sex: Treating RN: 1970/11/24 (50 y.o. Roel Cluck Primary Care Provider: Theophilus Kinds Other Clinician: Referring Provider: Treating  Provider/Extender: Curt Bears in Treatment: 5 Subjective Chief Complaint Information obtained from Patient 01/31/2021; patient is here for review of a very large surgical wound on the left buttock History of Present Illness (HPI) ADMISSION 12/31/2020 This is a 50 year old woman with poorly controlled type 2 diabetes on insulin. Her problem began after a fall in late April. She developed a pimple area on her left buttock that rapidly expanded. She was admitted to hospital from 12/28/2020 through 01/19/2021 initially I believed to [facility] subsequently transferred to [facility] when she developed clear septic shock. 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 Her wound culture grew grew group B strep. She required  operative debridements on 6/3 and 6/6 and subsequent bedside debridements as well. I have another culture result that showed E. coli. She was discharged on cephalexin and Augmentin she is long since finished this. She is now at home using Silvadene cream covered with ABDs. Past medical history includes new onset A. fib on Eliquis type 2 diabetes on insulin with a recent hemoglobin A1c of 11.1. As far as I can tell she is eating and drinking well she is taking Ensure at home. She has had significant weight loss dating back prior to this infection I am not exactly sure of the cause here 8/15; patient presents for follow-up. Its been 1 month since patient has last been seen. She is currently using Dakin's wet-to-dry dressings. She has no issues or complaints today. She denies signs of infection. Patient History Information obtained from Patient. Family History Cancer - Mother, Diabetes - Mother,Father, Hypertension - Mother,Father, Lung Disease - Mother, No family history of Heart Disease, Hereditary Spherocytosis, Kidney Disease, Seizures, Stroke, Thyroid Problems, Tuberculosis. Social History Current every day smoker - 2-3  cigarettes a day, Marital Status - Widowed, Alcohol Use - Never, Drug Use - No History, Caffeine Use - Daily - coffee. Medical History Cardiovascular Patient has history of Arrhythmia, Congestive Heart Failure Endocrine Patient has history of Type II Diabetes Neurologic Patient has history of Neuropathy Medical A Surgical History Notes nd Genitourinary Acute Kidney Injury Objective Constitutional respirations regular, non-labored and within target range for patient.. Vitals Time Taken: 12:51 PM, Temperature: 98.6 F, Pulse: 94 bpm, Respiratory Rate: 17 breaths/min, Blood Pressure: 133/83 mmHg, Capillary Blood Glucose: 119 mg/dl. Psychiatric pleasant and cooperative. General Notes: Wound is localized to almost her entire left buttocks. No probing to bone. Granulation tissue throughout with no signs of infection. Integumentary (Hair, Skin) Wound #1 status is Open. Original cause of wound was Other Lesion. The date acquired was: 12/23/2020. The wound has been in treatment 5 weeks. The wound is located on the Left Gluteus. The wound measures 13cm length x 6.2cm width x 3cm depth; 63.303cm^2 area and 189.909cm^3 volume. There is Fat Layer (Subcutaneous Tissue) exposed. There is no tunneling noted, however, there is undermining starting at 12:00 and ending at 2:00 with a maximum distance of 2cm. There is a medium amount of serosanguineous drainage noted. The wound margin is well defined and not attached to the wound base. There is large (67- 100%) red granulation within the wound bed. There is no necrotic tissue within the wound bed. Assessment Active Problems ICD-10 Disruption of external operation (surgical) wound, not elsewhere classified, subsequent encounter Unspecified open wound of left buttock, subsequent encounter Necrotizing fasciitis Type 2 diabetes mellitus with other skin ulcer Patient's wound has shown improvement in size since last clinic visit. No signs of infection on  exam. Overall the tissue looks healthy with no probing to bone. I recommended continuing Dakin's wet-to-dry. We will also try and see if she can obtain home health. Plan Follow-up Appointments: Return Appointment in 2 weeks. - 03/11/2021 Dr. Leanord Hawkingobson Bathing/ Shower/ Hygiene: May shower and wash wound with soap and water. Off-Loading: Low air-loss mattress (Group 2) - continue to use. Turn and reposition every 2 hours - only up for meals and back to bed. Additional Orders / Instructions: Follow Nutritious Diet - increase protein, eat three meals a day. Home Health: Admit to Home Health for wound care. May utilize formulary equivalent dressing for wound treatment orders unless otherwise specified. [facility] for 1-2 times a week dressing  changes. Patient wound is a daily dressing change. WOUND #1: - Gluteus Wound Laterality: Left Cleanser: Soap and Water (Home Health) 1 x Per Day/30 Days Discharge Instructions: May shower and wash wound with dial antibacterial soap and water prior to dressing change. Cleanser: Wound Cleanser (Home Health) 1 x Per Day/30 Days Discharge Instructions: Cleanse the wound with wound cleanser prior to applying a clean dressing using gauze sponges, not tissue or cotton balls. Peri-Wound Care: Skin Prep (Home Health) 1 x Per Day/30 Days Discharge Instructions: Use skin prep as directed Prim Dressing: Rolled Gauze (Kerlix) (Home Health) 1 x Per Day/30 Days ary Discharge Instructions: Moisten with Dakins and apply to wound bed. Secondary Dressing: Woven Gauze Sponges 2x2 in 1 x Per Day/30 Days Secondary Dressing: ABD Pad, 8x10 (Home Health) 1 x Per Day/30 Days Discharge Instructions: Apply over primary dressing as directed. Secured With: 44M Medipore H Soft Cloth Surgical T 4 x 2 (in/yd) (Home Health) 1 x Per Day/30 Days ape Discharge Instructions: Secure dressing with tape as directed. 1. Continue Dakin's wet-to-dry 2. Home health 3. Follow-up in 2  weeks Electronic Signature(s) Signed: 03/11/2021 1:49:12 PM By: Geralyn Corwin DO Entered By: Geralyn Corwin on 03/11/2021 13:45:12 -------------------------------------------------------------------------------- HxROS Details Patient Name: Date of Service: [name] [name], [name] [name] 03/11/2021 12:30 PM Medical Record Number: [id number] Patient Account Number: [id number] Date of Birth/Sex: Treating RN: [birth year] (50 y.o. Roel Cluck Primary Care Provider: Theophilus Kinds Other Clinician: Referring Provider: Treating Provider/Extender: Curt Bears in Treatment: 5 Information Obtained From Patient Cardiovascular Medical History: Positive for: Arrhythmia; Congestive Heart Failure Endocrine Medical History: Positive for: Type II Diabetes Time with diabetes: 16 years Treated with: Insulin, Oral agents Blood sugar tested every day: Yes Tested : AC and HS Genitourinary Medical History: Past Medical History Notes: Acute Kidney Injury Neurologic Medical History: Positive for: Neuropathy Immunizations Pneumococcal Vaccine: Received Pneumococcal Vaccination: No Implantable Devices None Family and Social History Cancer: Yes - Mother; Diabetes: Yes - Mother,Father; Heart Disease: No; Hereditary Spherocytosis: No; Hypertension: Yes - Mother,Father; Kidney Disease: No; Lung Disease: Yes - Mother; Seizures: No; Stroke: No; Thyroid Problems: No; Tuberculosis: No; Current every day smoker - 2-3 cigarettes a day; Marital Status - Widowed; Alcohol Use: Never; Drug Use: No History; Caffeine Use: Daily - coffee; Financial Concerns: No; Food, Clothing or Shelter Needs: No; Support System Lacking: No; Transportation Concerns: No Electronic Signature(s) Signed: 03/11/2021 1:49:12 PM By: Geralyn Corwin DO Signed: 03/11/2021 5:36:52 PM By: Antonieta Iba Entered By: Geralyn Corwin on 03/11/2021  13:42:14 -------------------------------------------------------------------------------- SuperBill Details Patient Name: Date of Service: [name] [name], [name] [name] 03/11/2021 Medical Record Number: [id number] Patient Account Number: [id number] Date of Birth/Sex: Treating RN: 1971/05/11 (50 y.o. Roel Cluck Primary Care Provider: Theophilus Kinds Other Clinician: Referring Provider: Treating Provider/Extender: Curt Bears in Treatment: 5 Diagnosis Coding ICD-10 Codes Code Description T81.31XD Disruption of external operation (surgical) wound, not elsewhere classified, subsequent encounter S31.829D Unspecified open wound of left buttock, subsequent encounter M72.6 Necrotizing fasciitis E11.622 Type 2 diabetes mellitus with other skin ulcer Facility Procedures CPT4 Code: [id number] Description: 99213 - WOUND CARE VISIT-LEV 3 EST PT Modifier: Quantity: 1 Physician Procedures : CPT4 Code Description Modifier [id number] - WC PHYS LEVEL 3 - EST PT ICD-10 Diagnosis Description T81.31XD Disruption of external operation (surgical) wound, not elsewhere classified, subsequent encounter S31.829D Unspecified open wound of left  buttock, subsequent encounter M72.6 Necrotizing fasciitis E11.622 Type 2 diabetes mellitus with other skin ulcer Quantity: 1 Electronic Signature(s) Signed: 03/11/2021 1:49:12 PM  By: Geralyn Corwin DO Entered By: Geralyn Corwin on 03/11/2021 13:46:03

## 2021-03-12 NOTE — Progress Notes (Signed)
[name] [name], [name] [name] ([id number]) Visit Report for 03/11/2021 Arrival Information Details Patient Name: Date of Service: [name] [name], [name] [name] 03/11/2021 12:30 PM Medical Record Number: [id number] Patient Account Number: [id number] Date of Birth/Sex: Treating RN: October 09, 1970 (50 y.o. [name] [name], [name] [name] Primary Care Mccall Will: Corena Pilgrim Other Clinician: Referring Amandeep Nesmith: Treating Elexis Pollak/Extender: Darral Dash in Treatment: 5 Visit Information History Since Last Visit Added or deleted any medications: No Patient Arrived: Wheel Chair Any new allergies or adverse reactions: No Arrival Time: 12:47 Had a fall or experienced change in No Accompanied By: Sons activities of daily living that may affect Transfer Assistance: None risk of falls: Patient Identification Verified: Yes Signs or symptoms of abuse/neglect since last visito No Secondary Verification Process Completed: Yes Hospitalized since last visit: No Patient Requires Transmission-Based Precautions: No Implantable device outside of the clinic excluding No Patient Has Alerts: Yes cellular tissue based products placed in the center Patient Alerts: Patient on Blood Thinner since last visit: Has Dressing in Place as Prescribed: Yes Pain Present Now: Yes Electronic Signature(s) Signed: 03/12/2021 5:16:57 PM By: Rhae Hammock RN Entered By: Rhae Hammock on 03/11/2021 12:51:00 -------------------------------------------------------------------------------- Clinic Level of Care Assessment Details Patient Name: Date of Service: [name] [name] 03/11/2021 12:30 PM Medical Record Number: [id number] Patient Account Number: [id number] Date of Birth/Sex: Treating RN: 1971/02/01 (50 y.o. Sue Lush Primary Care Branden Shallenberger: Corena Pilgrim Other Clinician: Referring Anselm Aumiller: Treating Gerlean Cid/Extender: Darral Dash in Treatment: 5 Clinic Level of Care Assessment Items TOOL 4  Quantity Score X- 1 0 Use when only an EandM is performed on FOLLOW-UP visit ASSESSMENTS - Nursing Assessment / Reassessment X- 1 10 Reassessment of Co-morbidities (includes updates in patient status) X- 1 5 Reassessment of Adherence to Treatment Plan ASSESSMENTS - Wound and Skin A ssessment / Reassessment _0  - 0 Simple Wound Assessment / Reassessment - one wound X- 1 5 Complex Wound Assessment / Reassessment - multiple wounds _1  - 0 Dermatologic / Skin Assessment (not related to wound area) ASSESSMENTS - Focused Assessment _2  - 0 Circumferential Edema Measurements - multi extremities _3  - 0 Nutritional Assessment / Counseling / Intervention _4  - 0 Lower Extremity Assessment (monofilament, tuning fork, pulses) _5  - 0 Peripheral Arterial Disease Assessment (using hand held doppler) ASSESSMENTS - Ostomy and/or Continence Assessment and Care _6  - 0 Incontinence Assessment and Management _7  - 0 Ostomy Care Assessment and Management (repouching, etc.) PROCESS - Coordination of Care _8  - 0 Simple Patient / Family Education for ongoing care X- 1 20 Complex (extensive) Patient / Family Education for ongoing care _9  - 0 Staff obtains Programmer, systems, Records, T Results / Process Orders est X- 1 10 Staff telephones HHA, Nursing Homes / Clarify orders / etc _10  - 0 Routine Transfer to another Facility (non-emergent condition) _11  - 0 Routine Hospital Admission (non-emergent condition) _12  - 0 New Admissions / Biomedical engineer / Ordering NPWT Apligraf, etc. , _13  - 0 Emergency Hospital Admission (emergent condition) _14  - 0 Simple Discharge Coordination _15  - 0 Complex (extensive) Discharge Coordination PROCESS - Special Needs _16  - 0 Pediatric / Minor Patient Management _17  - 0 Isolation Patient Management _18  - 0 Hearing / Language / Visual special needs _19  - 0 Assessment of Community assistance (transportation, D/C planning, etc.) _20  - 0 Additional assistance / Altered  mentation _21  - 0 Support Surface(s) Assessment (bed, cushion, seat, etc.) INTERVENTIONS - Wound Cleansing / Measurement _22  - 0 Simple Wound Cleansing - one wound X- 1 5 Complex Wound Cleansing - multiple wounds  X- 1 5 Wound Imaging (photographs - any number of wounds) _0  - 0 Wound Tracing (instead of photographs) X- 1 5 Simple Wound Measurement - one wound _1  - 0 Complex Wound Measurement - multiple wounds INTERVENTIONS - Wound Dressings _2  - 0 Small Wound Dressing one or multiple wounds X- 1 15 Medium Wound Dressing one or multiple wounds _3  - 0 Large Wound Dressing one or multiple wounds <ZOXWRUEAVWUJWJXB>_1<\/YNWGNFAOZHYQMVHQ>_4  - 0 Application of Medications - topical <ONGEXBMWUXLKGMWN>_0<\/UVOZDGUYQIHKVQQV>_9  - 0 Application of Medications - injection INTERVENTIONS - Miscellaneous _6  - 0 External ear exam _7  - 0 Specimen Collection (cultures, biopsies, blood, body fluids, etc.) _8  - 0 Specimen(s) / Culture(s) sent or taken to Lab for analysis _9  - 0 Patient Transfer (multiple staff / Civil Service fast streamer / Similar devices) _10  - 0 Simple Staple / Suture removal (25 or less) _11  - 0 Complex Staple / Suture removal (26 or more) _12  - 0 Hypo / Hyperglycemic Management (close monitor of Blood Glucose) _13  - 0 Ankle / Brachial Index (ABI) - do not check if billed separately X- 1 5 Vital Signs Has the patient been seen at the hospital within the last three years: Yes Total Score: 85 Level Of Care: New/Established - Level 3 Electronic Signature(s) Signed: 03/11/2021 5:36:52 PM By: Lorrin Jackson Entered By: Lorrin Jackson on 03/11/2021 13:15:50 -------------------------------------------------------------------------------- Encounter Discharge Information Details Patient Name: Date of Service: [name] [name], [name] [name] 03/11/2021 12:30 PM Medical Record Number: [id number] Patient Account Number: [id number] Date of Birth/Sex: Treating RN: 05-15-1971 (50 y.o. Helene Shoe, Tammi Klippel Primary Care Shaquinta Peruski: Corena Pilgrim Other Clinician: Referring Nivan Melendrez: Treating  Kaisha Wachob/Extender: Darral Dash in Treatment: 5 Encounter Discharge Information Items Discharge Condition: Stable Ambulatory Status: Wheelchair Discharge Destination: Home Transportation: Private Auto Accompanied By: son Schedule Follow-up Appointment: Yes Clinical Summary of Care: Electronic Signature(s) Signed: 03/11/2021 5:38:02 PM By: Deon Pilling Entered By: Deon Pilling on 03/11/2021 17:34:02 -------------------------------------------------------------------------------- Lower Extremity Assessment Details Patient Name: Date of Service: [name] [name], [name] [name] 03/11/2021 12:30 PM Medical Record Number: [id number] Patient Account Number: [id number] Date of Birth/Sex: Treating RN: June 19, 1971 (50 y.o. [name] [name], [name] [name] Primary Care Garett Tetzloff: Corena Pilgrim Other Clinician: Referring Nayda Riesen: Treating Dalyla Chui/Extender: Darral Dash in Treatment: 5 Electronic Signature(s) Signed: 03/12/2021 5:16:57 PM By: Rhae Hammock RN Entered By: Rhae Hammock on 03/11/2021 12:52:25 -------------------------------------------------------------------------------- Multi Wound Chart Details Patient Name: Date of Service: [name] [name], [name] [name] 03/11/2021 12:30 PM Medical Record Number: [id number] Patient Account Number: [id number] Date of Birth/Sex: Treating RN: 11-02-1970 (50 y.o. Sue Lush Primary Care Akiera Allbaugh: Corena Pilgrim Other Clinician: Referring Zaheer Wageman: Treating Jamyra Zweig/Extender: Darral Dash in Treatment: 5 Vital Signs Height(in): Capillary Blood Glucose(mg/dl): 119 Weight(lbs): Pulse(bpm): 87 Body Mass Index(BMI): Blood Pressure(mmHg): 133/83 Temperature(F): 98.6 Respiratory Rate(breaths/min): 17 Photos: [N/A:N/A] Left Gluteus N/A N/A Wound Location: Other Lesion N/A N/A Wounding Event: Infection - not elsewhere classified N/A N/A Primary Etiology: Arrhythmia, Congestive Heart Failure,  N/A N/A Comorbid History: Type II Diabetes, Neuropathy 12/23/2020 N/A N/A Date Acquired: 5 N/A N/A Weeks of Treatment: Open N/A N/A Wound Status: 13x6.2x3 N/A N/A Measurements L x W x D (cm) 63.303 N/A N/A A (cm) : rea 189.909 N/A N/A Volume (cm) : 37.00% N/A N/A % Reduction in A rea: 37.00% N/A N/A % Reduction in Volume: 12 Starting Position 1 (o'clock): 2 Ending Position 1 (o'clock): 2 Maximum Distance 1 (cm): Yes N/A N/A Undermining: Full Thickness Without Exposed N/A N/A Classification: Support Structures Medium N/A N/A Exudate Amount: Serosanguineous N/A N/A Exudate Type: red,  brown N/A N/A Exudate Color: Well defined, not attached N/A N/A Wound Margin: Large (67-100%) N/A N/A Granulation Amount: Red N/A N/A Granulation Quality: None Present (0%) N/A N/A Necrotic Amount: Fat Layer (Subcutaneous Tissue): Yes N/A N/A Exposed Structures: Fascia: No Tendon: No Muscle: No Joint: No Bone: No Small (1-33%) N/A N/A Epithelialization: Treatment Notes Electronic Signature(s) Signed: 03/11/2021 1:49:12 PM By: Kalman Shan DO Signed: 03/11/2021 5:36:52 PM By: Lorrin Jackson Entered By: Kalman Shan on 03/11/2021 13:41:02 -------------------------------------------------------------------------------- Multi-Disciplinary Care Plan Details Patient Name: Date of Service: [name] [name], [name] [name] 03/11/2021 12:30 PM Medical Record Number: [id number] Patient Account Number: [id number] Date of Birth/Sex: Treating RN: November 19, 1970 (50 y.o. Sue Lush Primary Care Analynn Daum: Other Clinician: Corena Pilgrim Referring Lashante Fryberger: Treating Kemaria Dedic/Extender: Darral Dash in Treatment: 5 Active Inactive Nutrition Nursing Diagnoses: Potential for alteratiion in Nutrition/Potential for imbalanced nutrition Goals: Patient/caregiver agrees to and verbalizes understanding of need to obtain nutritional consultation Date Initiated:  01/31/2021 Target Resolution Date: 03/01/2021 Goal Status: Active Interventions: Provide education on nutrition Treatment Activities: Education provided on Nutrition : 01/31/2021 Patient referred to Primary Care Physician for further nutritional evaluation : 01/31/2021 Notes: Wound/Skin Impairment Nursing Diagnoses: Knowledge deficit related to ulceration/compromised skin integrity Goals: Patient/caregiver will verbalize understanding of skin care regimen Date Initiated: 01/31/2021 Date Inactivated: 03/11/2021 Target Resolution Date: 02/28/2021 Goal Status: Met Ulcer/skin breakdown will have a volume reduction of 30% by week 4 Date Initiated: 03/11/2021 Date Inactivated: 03/11/2021 Target Resolution Date: 02/28/2021 Goal Status: Met Ulcer/skin breakdown will have a volume reduction of 50% by week 8 Date Initiated: 03/11/2021 Target Resolution Date: 04/01/2021 Goal Status: Active Interventions: Assess patient/caregiver ability to obtain necessary supplies Assess patient/caregiver ability to perform ulcer/skin care regimen upon admission and as needed Provide education on ulcer and skin care Treatment Activities: Skin care regimen initiated : 01/31/2021 Topical wound management initiated : 01/31/2021 Notes: Electronic Signature(s) Signed: 03/11/2021 1:06:13 PM By: Lorrin Jackson Previous Signature: 03/11/2021 1:05:28 PM Version By: Lorrin Jackson Entered By: Lorrin Jackson on 03/11/2021 13:06:13 -------------------------------------------------------------------------------- Pain Assessment Details Patient Name: Date of Service: [name] [name], [name] [name] 03/11/2021 12:30 PM Medical Record Number: [id number] Patient Account Number: [id number] Date of Birth/Sex: Treating RN: 1971-06-23 (50 y.o. [name] [name], [name] [name] Primary Care Revere Maahs: Corena Pilgrim Other Clinician: Referring Rainie Crenshaw: Treating Lyncoln Ledgerwood/Extender: Darral Dash in Treatment: 5 Active Problems Location of Pain  Severity and Description of Pain Patient Has Paino Yes Site Locations Pain Location: Generalized Pain, Pain in Ulcers With Dressing Change: Yes Duration of the Pain. Constant / Intermittento Intermittent Rate the pain. Current Pain Level: 10 Worst Pain Level: 10 Least Pain Level: 0 Tolerable Pain Level: 10 Character of Pain Describe the Pain: Aching Pain Management and Medication Current Pain Management: Medication: No Cold Application: No Rest: No Massage: No Activity: No T.E.N.S.: No Heat Application: No Leg drop or elevation: No Is the Current Pain Management Adequate: Adequate How does your wound impact your activities of daily livingo Sleep: No Bathing: No Appetite: No Relationship With Others: No Bladder Continence: No Emotions: No Bowel Continence: No Work: No Toileting: No Drive: No Dressing: No Hobbies: No Electronic Signature(s) Signed: 03/12/2021 5:16:57 PM By: Rhae Hammock RN Entered By: Rhae Hammock on 03/11/2021 12:52:18 -------------------------------------------------------------------------------- Patient/Caregiver Education Details Patient Name: Date of Service: [name] [name], [name] [name] 8/15/2022andnbsp12:30 PM Medical Record Number: [id number] Patient Account Number: [id number] Date of Birth/Gender: Treating RN: August 26, 1970 (50 y.o. Sue Lush Primary Care Physician: Corena Pilgrim Other Clinician: Referring Physician: Treating Physician/Extender: Darral Dash  in Treatment: 5 Education Assessment Education Provided To: Patient Education Topics Provided Nutrition: Methods: Explain/Verbal Responses: State content correctly Pressure: Methods: Explain/Verbal Responses: State content correctly Wound/Skin Impairment: Methods: Demonstration, Explain/Verbal, Printed Responses: State content correctly Electronic Signature(s) Signed: 03/11/2021 5:36:52 PM By: Lorrin Jackson Entered By: Lorrin Jackson on  03/11/2021 13:06:37 -------------------------------------------------------------------------------- Wound Assessment Details Patient Name: Date of Service: [name] [name], [name] [name] 03/11/2021 12:30 PM Medical Record Number: [id number] Patient Account Number: [id number] Date of Birth/Sex: Treating RN: 07/30/1970 (50 y.o. [name] [name], [name] [name] Primary Care Maila Dukes: Corena Pilgrim Other Clinician: Referring Journii Nierman: Treating Kunal Levario/Extender: Darral Dash in Treatment: 5 Wound Status Wound Number: 1 Primary Infection - not elsewhere classified Etiology: Wound Location: Left Gluteus Wound Status: Open Wounding Event: Other Lesion Comorbid Arrhythmia, Congestive Heart Failure, Type II Diabetes, Date Acquired: 12/23/2020 History: Neuropathy Weeks Of Treatment: 5 Clustered Wound: No Photos Wound Measurements Length: (cm) 13 Width: (cm) 6.2 Depth: (cm) 3 Area: (cm) 63.303 Volume: (cm) 189.909 % Reduction in Area: 37% % Reduction in Volume: 37% Epithelialization: Small (1-33%) Tunneling: No Undermining: Yes Starting Position (o'clock): 12 Ending Position (o'clock): 2 Maximum Distance: (cm) 2 Wound Description Classification: Full Thickness Without Exposed Support Structures Wound Margin: Well defined, not attached Exudate Amount: Medium Exudate Type: Serosanguineous Exudate Color: red, brown Foul Odor After Cleansing: No Slough/Fibrino No Wound Bed Granulation Amount: Large (67-100%) Exposed Structure Granulation Quality: Red Fascia Exposed: No Necrotic Amount: None Present (0%) Fat Layer (Subcutaneous Tissue) Exposed: Yes Tendon Exposed: No Muscle Exposed: No Joint Exposed: No Bone Exposed: No Treatment Notes Wound #1 (Gluteus) Wound Laterality: Left Cleanser Soap and Water Discharge Instruction: May shower and wash wound with dial antibacterial soap and water prior to dressing change. Wound Cleanser Discharge Instruction: Cleanse the wound  with wound cleanser prior to applying a clean dressing using gauze sponges, not tissue or cotton balls. Peri-Wound Care Skin Prep Discharge Instruction: Use skin prep as directed Topical Primary Dressing Rolled Gauze (Kerlix) Discharge Instruction: Moisten with Dakins and apply to wound bed. Secondary Dressing Woven Gauze Sponges 2x2 in ABD Pad, 8x10 Discharge Instruction: Apply over primary dressing as directed. Secured With 30M Medipore H Soft Cloth Surgical T 4 x 2 (in/yd) ape Discharge Instruction: Secure dressing with tape as directed. Compression Wrap Compression Stockings Add-Ons Electronic Signature(s) Signed: 03/12/2021 5:16:57 PM By: Rhae Hammock RN Entered By: Rhae Hammock on 03/11/2021 12:57:34 -------------------------------------------------------------------------------- Vitals Details Patient Name: Date of Service: [name] [name], [name] [name] 03/11/2021 12:30 PM Medical Record Number: [id number] Patient Account Number: [id number] Date of Birth/Sex: Treating RN: 02-28-1971 (50 y.o. [name] [name], [name] [name] Primary Care Sae Handrich: Corena Pilgrim Other Clinician: Referring Maysen Sudol: Treating Olamide Lahaie/Extender: Darral Dash in Treatment: 5 Vital Signs Time Taken: 12:51 Temperature (F): 98.6 Pulse (bpm): 94 Respiratory Rate (breaths/min): 17 Blood Pressure (mmHg): 133/83 Capillary Blood Glucose (mg/dl): 119 Reference Range: 80 - 120 mg / dl Electronic Signature(s) Signed: 03/12/2021 5:16:57 PM By: Rhae Hammock RN Entered By: Rhae Hammock on 03/11/2021 12:51:32

## 2021-03-26 ENCOUNTER — Other Ambulatory Visit: Payer: Self-pay

## 2021-03-26 ENCOUNTER — Encounter (HOSPITAL_BASED_OUTPATIENT_CLINIC_OR_DEPARTMENT_OTHER): Payer: Medicaid Other | Admitting: Internal Medicine

## 2021-03-26 DIAGNOSIS — T8131XA Disruption of external operation (surgical) wound, not elsewhere classified, initial encounter: Secondary | ICD-10-CM | POA: Diagnosis not present

## 2021-03-27 NOTE — Progress Notes (Signed)
IZUMI, MIXON ([id number]) Visit Report for 03/26/2021 Debridement Details Patient Name: Date of Service: [name] [name], [name] [name] 03/26/2021 10:15 A M Medical Record Number: [id number] Patient Account Number: [id number] Date of Birth/Sex: Treating RN: 1971/07/09 (50 y.o. Ardis Rowan, [name] Primary Care Provider: Theophilus Kinds Other Clinician: Referring Provider: Treating Provider/Extender: Rockne Coons in Treatment: 7 Debridement Performed for Assessment: Wound #1 Left Gluteus Performed By: Physician Maxwell Caul., MD Debridement Type: Debridement Level of Consciousness (Pre-procedure): Awake and Alert Pre-procedure Verification/Time Out Yes - 10:50 Taken: Start Time: 10:50 Pain Control: Lidocaine T Area Debrided (L x W): otal 10 (cm) x 7 (cm) = 70 (cm) Tissue and other material debrided: Viable, Non-Viable, Slough, Subcutaneous, Skin: Dermis , Skin: Epidermis, Slough Level: Skin/Subcutaneous Tissue Debridement Description: Excisional Instrument: Curette Bleeding: Minimum Hemostasis Achieved: Pressure End Time: 10:50 Procedural Pain: 0 Post Procedural Pain: 0 Response to Treatment: Procedure was tolerated well Level of Consciousness (Post- Awake and Alert procedure): Post Debridement Measurements of Total Wound Length: (cm) 10 Width: (cm) 7 Depth: (cm) 2.76 Volume: (cm) 151.739 Character of Wound/Ulcer Post Debridement: Improved Post Procedure Diagnosis Same as Pre-procedure Electronic Signature(s) Signed: 03/26/2021 5:24:19 PM By: Fonnie Mu RN Signed: 03/27/2021 7:53:49 AM By: Baltazar Najjar MD Entered By: Baltazar Najjar on 03/26/2021 11:12:26 -------------------------------------------------------------------------------- HPI Details Patient Name: Date of Service: [name] [name], [name] [name] 03/26/2021 10:15 A M Medical Record Number: [id number] Patient Account Number: [id number] Date of Birth/Sex: Treating RN: [birth year] (50 y.o. Ardis Rowan,  [name] Primary Care Provider: Theophilus Kinds Other Clinician: Referring Provider: Treating Provider/Extender: Rockne Coons in Treatment: 7 History of Present Illness HPI Description: ADMISSION 12/31/2020 This is a 50 year old woman with poorly controlled type 2 diabetes on insulin. Her problem began after a fall in late April. She developed a pimple area on her left buttock that rapidly expanded. She was admitted to hospital from 12/28/2020 through 01/19/2021 initially I believed to [facility] subsequently transferred to [facility] when she developed clear septic shock. 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 Her wound culture grew grew group B strep. She required operative debridements on 6/3 and 6/6 and subsequent bedside debridements as well. I have another culture result that showed E. coli. She was discharged on cephalexin and Augmentin she is long since finished this. She is now at home using Silvadene cream covered with ABDs. Past medical history includes new onset A. fib on Eliquis type 2 diabetes on insulin with a recent hemoglobin A1c of 11.1. As far as I can tell she is eating and drinking well she is taking Ensure at home. She has had significant weight loss dating back prior to this infection I am not exactly sure of the cause here 8/15; patient presents for follow-up. Its been 1 month since patient has last been seen. She is currently using Dakin's wet-to-dry dressings. She has no issues or complaints today. She denies signs of infection. 8/30; this is a patient have not seen in almost 2 months. She has a pressure ulcer on her left buttock and reasonably close proximity to her anal opening. Most of this appears fairly superficial however most proximally there is an area with more depth. She apparently has poorly controlled diabetes. She has been using Dakin's wet-to-dry for about 7 weeks now. She is changing this multiple  times a day her son does the dressing. She says out of concern for urine but this does not look like an area that should be getting contaminated  with urine. She claims not to be incontinent. She has a level 3 pressure relief surface I think by her description Electronic Signature(s) Signed: 03/27/2021 7:53:49 AM By: Baltazar Najjar MD Entered By: Baltazar Najjar on 03/26/2021 11:14:34 -------------------------------------------------------------------------------- Physical Exam Details Patient Name: Date of Service: [name] [name], [name] [name] 03/26/2021 10:15 A M Medical Record Number: [id number] Patient Account Number: [id number] Date of Birth/Sex: Treating RN: Mar 08, 1971 (50 y.o. Ardis Rowan, [name] Primary Care Provider: Theophilus Kinds Other Clinician: Referring Provider: Treating Provider/Extender: Rockne Coons in Treatment: 7 Constitutional Sitting or standing Blood Pressure is within target range for patient.. Pulse regular and within target range for patient.Marland Kitchen Respirations regular, non-labored and within target range.. Temperature is normal and within the target range for the patient.Marland Kitchen Appears in no distress. Notes Wound exam; large wound on the left buttock. Under illumination the granulation did not look particularly healthy. I used an open curette on this but I could not remove the very fibrinous surface. I then used a #5 curette into the subcutaneous tissue to remove the surface of this and hopefully allow further epithelialization. Optimistically the dimensions are somewhat better. No evidence of infection Electronic Signature(s) Signed: 03/27/2021 7:53:49 AM By: Baltazar Najjar MD Entered By: Baltazar Najjar on 03/26/2021 11:15:51 -------------------------------------------------------------------------------- Physician Orders Details Patient Name: Date of Service: [name] [name], [name] [name] 03/26/2021 10:15 A M Medical Record Number: [id number] Patient Account Number:  [id number] Date of Birth/Sex: Treating RN: [birth year] (50 y.o. Ardis Rowan, [name] Primary Care Provider: Theophilus Kinds Other Clinician: Referring Provider: Treating Provider/Extender: Rockne Coons in Treatment: 7 Verbal / Phone Orders: No Diagnosis Coding Follow-up Appointments ppointment in 2 weeks. - Dr. Leanord Hawking!! Return A Bathing/ Shower/ Hygiene May shower and wash wound with soap and water. Off-Loading Low air-loss mattress (Group 2) - continue to use. Turn and reposition every 2 hours - only up for meals and back to bed. Additional Orders / Instructions Follow Nutritious Diet - increase protein, eat three meals a day. Wound Treatment Wound #1 - Gluteus Wound Laterality: Left Cleanser: Soap and Water (Home Health) 1 x Per Day/30 Days Discharge Instructions: May shower and wash wound with dial antibacterial soap and water prior to dressing change. Cleanser: Wound Cleanser (Home Health) 1 x Per Day/30 Days Discharge Instructions: Cleanse the wound with wound cleanser prior to applying a clean dressing using gauze sponges, not tissue or cotton balls. Peri-Wound Care: Skin Prep (Home Health) 1 x Per Day/30 Days Discharge Instructions: Use skin prep as directed Prim Dressing: MediHoney Gel, tube 1.5 (oz) (Home Health) 1 x Per Day/30 Days ary Discharge Instructions: Apply to wound bed as instructed Secondary Dressing: Woven Gauze Sponges 2x2 in (Home Health) 1 x Per Day/30 Days Secondary Dressing: ABD Pad, 8x10 (Home Health) 1 x Per Day/30 Days Discharge Instructions: Apply over primary dressing as directed. Secured With: 72M Medipore H Soft Cloth Surgical T 4 x 2 (in/yd) (Home Health) 1 x Per Day/30 Days ape Discharge Instructions: Secure dressing with tape as directed. Electronic Signature(s) Signed: 03/26/2021 5:24:19 PM By: Fonnie Mu RN Signed: 03/27/2021 7:53:49 AM By: Baltazar Najjar MD Entered By: Fonnie Mu on 03/26/2021  10:54:53 -------------------------------------------------------------------------------- Problem List Details Patient Name: Date of Service: [name] [name], [name] [name] 03/26/2021 10:15 A M Medical Record Number: [id number] Patient Account Number: [id number] Date of Birth/Sex: Treating RN: 18-Jul-1971 (50 y.o. Ardis Rowan, [name] Primary Care Provider: Theophilus Kinds Other Clinician: Referring Provider: Treating Provider/Extender: Rockne Coons in Treatment: 7 Active Problems ICD-10 Encounter Code Description  Active Date MDM Diagnosis T81.31XD Disruption of external operation (surgical) wound, not elsewhere classified, 01/31/2021 No Yes subsequent encounter S31.829D Unspecified open wound of left buttock, subsequent encounter 01/31/2021 No Yes M72.6 Necrotizing fasciitis 01/31/2021 No Yes E11.622 Type 2 diabetes mellitus with other skin ulcer 01/31/2021 No Yes Inactive Problems Resolved Problems Electronic Signature(s) Signed: 03/27/2021 7:53:49 AM By: Baltazar Najjar MD Entered By: Baltazar Najjar on 03/26/2021 11:12:07 -------------------------------------------------------------------------------- Progress Note Details Patient Name: Date of Service: [name] [name], [name] [name] 03/26/2021 10:15 A M Medical Record Number: [id number] Patient Account Number: [id number] Date of Birth/Sex: Treating RN: 08/07/1970 (50 y.o. Ardis Rowan, [name] Primary Care Provider: Theophilus Kinds Other Clinician: Referring Provider: Treating Provider/Extender: Rockne Coons in Treatment: 7 Subjective History of Present Illness (HPI) ADMISSION 12/31/2020 This is a 50 year old woman with poorly controlled type 2 diabetes on insulin. Her problem began after a fall in late April. She developed a pimple area on her left buttock that rapidly expanded. She was admitted to hospital from 12/28/2020 through 01/19/2021 initially I believed to [facility] subsequently transferred to [facility] when  she developed clear septic shock. 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 Her wound culture grew grew group B strep. She required operative debridements on 6/3 and 6/6 and subsequent bedside debridements as well. I have another culture result that showed E. coli. She was discharged on cephalexin and Augmentin she is long since finished this. She is now at home using Silvadene cream covered with ABDs. Past medical history includes new onset A. fib on Eliquis type 2 diabetes on insulin with a recent hemoglobin A1c of 11.1. As far as I can tell she is eating and drinking well she is taking Ensure at home. She has had significant weight loss dating back prior to this infection I am not exactly sure of the cause here 8/15; patient presents for follow-up. Its been 1 month since patient has last been seen. She is currently using Dakin's wet-to-dry dressings. She has no issues or complaints today. She denies signs of infection. 8/30; this is a patient have not seen in almost 2 months. She has a pressure ulcer on her left buttock and reasonably close proximity to her anal opening. Most of this appears fairly superficial however most proximally there is an area with more depth. She apparently has poorly controlled diabetes. She has been using Dakin's wet-to-dry for about 7 weeks now. She is changing this multiple times a day her son does the dressing. She says out of concern for urine but this does not look like an area that should be getting contaminated with urine. She claims not to be incontinent. She has a level 3 pressure relief surface I think by her description Objective Constitutional Sitting or standing Blood Pressure is within target range for patient.. Pulse regular and within target range for patient.Marland Kitchen Respirations regular, non-labored and within target range.. Temperature is normal and within the target range for the patient.Marland Kitchen Appears in no distress. Vitals  Time Taken: 10:22 AM, Temperature: 98.7 F, Pulse: 96 bpm, Respiratory Rate: 17 breaths/min, Blood Pressure: 135/74 mmHg, Capillary Blood Glucose: 101 mg/dl. General Notes: Wound exam; large wound on the left buttock. Under illumination the granulation did not look particularly healthy. I used an open curette on this but I could not remove the very fibrinous surface. I then used a #5 curette into the subcutaneous tissue to remove the surface of this and hopefully allow further epithelialization. Optimistically the dimensions are somewhat  better. No evidence of infection Integumentary (Hair, Skin) Wound #1 status is Open. Original cause of wound was Other Lesion. The date acquired was: 12/23/2020. The wound has been in treatment 7 weeks. The wound is located on the Left Gluteus. The wound measures 10cm length x 7cm width x 2.6cm depth; 54.978cm^2 area and 142.942cm^3 volume. There is Fat Layer (Subcutaneous Tissue) exposed. There is no tunneling noted, however, there is undermining starting at 12:00 and ending at 2:00 with a maximum distance of 2.4cm. There is a medium amount of serosanguineous drainage noted. The wound margin is well defined and not attached to the wound base. There is large (67- 100%) red granulation within the wound bed. There is no necrotic tissue within the wound bed. Assessment Active Problems ICD-10 Disruption of external operation (surgical) wound, not elsewhere classified, subsequent encounter Unspecified open wound of left buttock, subsequent encounter Necrotizing fasciitis Type 2 diabetes mellitus with other skin ulcer Procedures Wound #1 Pre-procedure diagnosis of Wound #1 is an Infection - not elsewhere classified located on the Left Gluteus . There was a Excisional Skin/Subcutaneous Tissue Debridement with a total area of 70 sq cm performed by Maxwell Caul., MD. With the following instrument(s): Curette to remove Viable and Non-Viable tissue/material. Material  removed includes Subcutaneous Tissue, Slough, Skin: Dermis, and Skin: Epidermis after achieving pain control using Lidocaine. No specimens were taken. A time out was conducted at 10:50, prior to the start of the procedure. A Minimum amount of bleeding was controlled with Pressure. The procedure was tolerated well with a pain level of 0 throughout and a pain level of 0 following the procedure. Post Debridement Measurements: 10cm length x 7cm width x 2.76cm depth; 151.739cm^3 volume. Character of Wound/Ulcer Post Debridement is improved. Post procedure Diagnosis Wound #1: Same as Pre-Procedure Plan Follow-up Appointments: Return Appointment in 2 weeks. - Dr. Leanord Hawking!! Bathing/ Shower/ Hygiene: May shower and wash wound with soap and water. Off-Loading: Low air-loss mattress (Group 2) - continue to use. Turn and reposition every 2 hours - only up for meals and back to bed. Additional Orders / Instructions: Follow Nutritious Diet - increase protein, eat three meals a day. WOUND #1: - Gluteus Wound Laterality: Left Cleanser: Soap and Water (Home Health) 1 x Per Day/30 Days Discharge Instructions: May shower and wash wound with dial antibacterial soap and water prior to dressing change. Cleanser: Wound Cleanser (Home Health) 1 x Per Day/30 Days Discharge Instructions: Cleanse the wound with wound cleanser prior to applying a clean dressing using gauze sponges, not tissue or cotton balls. Peri-Wound Care: Skin Prep (Home Health) 1 x Per Day/30 Days Discharge Instructions: Use skin prep as directed Prim Dressing: MediHoney Gel, tube 1.5 (oz) (Home Health) 1 x Per Day/30 Days ary Discharge Instructions: Apply to wound bed as instructed Secondary Dressing: Woven Gauze Sponges 2x2 in (Home Health) 1 x Per Day/30 Days Secondary Dressing: ABD Pad, 8x10 (Home Health) 1 x Per Day/30 Days Discharge Instructions: Apply over primary dressing as directed. Secured With: 59M Medipore H Soft Cloth Surgical T 4  x 2 (in/yd) (Home Health) 1 x Per Day/30 Days ape Discharge Instructions: Secure dressing with tape as directed. 1. Extensive debridement today 2. I am going to try to use Medihoney impregnated gauze on this to see if we can get some additional debridement. At this point I do not know the Dakin's wet- to-dry was doing anything. 3. Counseled the patient to keep the pressure off this 4. The patient has Medicaid [[employer]  variant] which really limits the choice of primary dressings we can use Electronic Signature(s) Signed: 03/27/2021 7:53:49 AM By: Baltazar Najjarobson, Jaice Lague MD Entered By: Baltazar Najjarobson, Mikhala Kenan on 03/26/2021 11:18:16 -------------------------------------------------------------------------------- SuperBill Details Patient Name: Date of Service: [name] [name], [name] [name] 03/26/2021 Medical Record Number: [id number] Patient Account Number: [id number] Date of Birth/Sex: Treating RN: April 13, 1971 (50 y.o. Ardis RowanF) Breedlove, [name] Primary Care Provider: Theophilus KindsMurphy, John Other Clinician: Referring Provider: Treating Provider/Extender: Rockne Coonsobson, Kylor Valverde Murphy, John Weeks in Treatment: 7 Diagnosis Coding ICD-10 Codes Code Description T81.31XD Disruption of external operation (surgical) wound, not elsewhere classified, subsequent encounter S31.829D Unspecified open wound of left buttock, subsequent encounter M72.6 Necrotizing fasciitis E11.622 Type 2 diabetes mellitus with other skin ulcer Facility Procedures CPT4 Code: [id number] Description: 11042 - DEB SUBQ TISSUE 20 SQ CM/< ICD-10 Diagnosis Description S31.829D Unspecified open wound of left buttock, subsequent encounter T81.31XD Disruption of external operation (surgical) wound, not elsewhere classified, subs Modifier: equent encounter Quantity: 1 CPT4 Code: [id number] Description: 11045 - DEB SUBQ TISS EA ADDL 20CM ICD-10 Diagnosis Description S31.829D Unspecified open wound of left buttock, subsequent encounter T81.31XD Disruption of external  operation (surgical) wound, not elsewhere classified, subs Modifier: equent encounter Quantity: 3 Physician Procedures : CPT4 Code Description Modifier [id number] 11042 - WC PHYS SUBQ TISS 20 SQ CM ICD-10 Diagnosis Description S31.829D Unspecified open wound of left buttock, subsequent encounter T81.31XD Disruption of external operation (surgical) wound, not elsewhere  classified, subsequent encounter Quantity: 1 : [id number] 11045 - WC PHYS SUBQ TISS EA ADDL 20 CM ICD-10 Diagnosis Description S31.829D Unspecified open wound of left buttock, subsequent encounter T81.31XD Disruption of external operation (surgical) wound, not elsewhere classified, subsequent  encounter Quantity: 3 Electronic Signature(s) Signed: 03/26/2021 5:24:19 PM By: Fonnie MuBreedlove, [name] RN Signed: 03/27/2021 7:53:49 AM By: Baltazar Najjarobson, Linea Calles MD Entered By: Fonnie MuBreedlove, [name] on 03/26/2021 11:27:01

## 2021-03-27 NOTE — Progress Notes (Signed)
[name] [name], [name] [name] ([id number]) Visit Report for 03/26/2021 Arrival Information Details Patient Name: Date of Service: [name] [name], [name] [name] 03/26/2021 10:15 A M Medical Record Number: [id number] Patient Account Number: [id number] Date of Birth/Sex: Treating RN: [birth year] (50 y.o. [name], [name] Primary Care Jakobee Brackins: Corena Pilgrim Other Clinician: Referring Ellaree Gear: Treating Lashina Milles/Extender: Glynn Octave in Treatment: 7 Visit Information History Since Last Visit Added or deleted any medications: No Patient Arrived: Wheel Chair Any new allergies or adverse reactions: No Arrival Time: 10:21 Had a fall or experienced change in No Accompanied By: self activities of daily living that may affect Transfer Assistance: Manual risk of falls: Patient Identification Verified: Yes Signs or symptoms of abuse/neglect since last visito No Secondary Verification Process Completed: Yes Hospitalized since last visit: No Patient Requires Transmission-Based Precautions: No Implantable device outside of the clinic excluding No Patient Has Alerts: Yes cellular tissue based products placed in the center Patient Alerts: Patient on Blood Thinner since last visit: Has Dressing in Place as Prescribed: Yes Pain Present Now: No Electronic Signature(s) Signed: 03/26/2021 5:24:19 PM By: Rhae Hammock RN Entered By: Rhae Hammock on 03/26/2021 10:21:58 -------------------------------------------------------------------------------- Encounter Discharge Information Details Patient Name: Date of Service: [name] [name], [name] [name] 03/26/2021 10:15 A M Medical Record Number: [id number] Patient Account Number: [id number] Date of Birth/Sex: Treating RN: Nov 26, 1970 (50 y.o. [name], [name] Primary Care Magdalynn Davilla: Corena Pilgrim Other Clinician: Referring Kojo Liby: Treating Shaylee Stanislawski/Extender: Glynn Octave in Treatment: 7 Encounter Discharge Information Items Post  Procedure Vitals Discharge Condition: Stable Temperature (F): 98.7 Ambulatory Status: Wheelchair Pulse (bpm): 74 Discharge Destination: Home Respiratory Rate (breaths/min): 17 Transportation: Private Auto Blood Pressure (mmHg): 134/74 Accompanied By: self Schedule Follow-up Appointment: Yes Clinical Summary of Care: Electronic Signature(s) Signed: 03/26/2021 5:24:19 PM By: Rhae Hammock RN Entered By: Rhae Hammock on 03/26/2021 17:23:29 -------------------------------------------------------------------------------- Lower Extremity Assessment Details Patient Name: Date of Service: [name] [name] 03/26/2021 10:15 A M Medical Record Number: [id number] Patient Account Number: [id number] Date of Birth/Sex: Treating RN: 1970/12/27 (50 y.o. [name], [name] Primary Care Nicholai Willette: Corena Pilgrim Other Clinician: Referring Lennox Leikam: Treating Richard Ritchey/Extender: Glynn Octave in Treatment: 7 Electronic Signature(s) Signed: 03/26/2021 5:24:19 PM By: Rhae Hammock RN Entered By: Rhae Hammock on 03/26/2021 10:24:07 -------------------------------------------------------------------------------- Multi Wound Chart Details Patient Name: Date of Service: [name] [name], [name] [name] 03/26/2021 10:15 A M Medical Record Number: [id number] Patient Account Number: [id number] Date of Birth/Sex: Treating RN: [birth year] (50 y.o. [name], [name] Primary Care Brynlei Klausner: Corena Pilgrim Other Clinician: Referring Aerabella Galasso: Treating Gargi Berch/Extender: Glynn Octave in Treatment: 7 Vital Signs Height(in): Capillary Blood Glucose(mg/dl): 101 Weight(lbs): Pulse(bpm): 96 Body Mass Index(BMI): Blood Pressure(mmHg): 135/74 Temperature(F): 98.7 Respiratory Rate(breaths/min): 17 Photos: [1:No Photos Left Gluteus] [N/A:N/A N/A] Wound Location: [1:Other Lesion] [N/A:N/A] Wounding Event: [1:Infection - not elsewhere classified N/A] Primary  Etiology: [1:Arrhythmia, Congestive Heart Failure, N/A] Comorbid History: [1:Type II Diabetes, Neuropathy 12/23/2020] [N/A:N/A] Date Acquired: [1:7] [N/A:N/A] Weeks of Treatment: [1:Open] [N/A:N/A] Wound Status: [1:10x7x2.6] [N/A:N/A] Measurements L x W x D (cm) [1:54.978] [N/A:N/A] A (cm) : rea [1:142.942] [N/A:N/A] Volume (cm) : [1:45.30%] [N/A:N/A] % Reduction in A rea: [1:52.60%] [N/A:N/A] % Reduction in Volume: [1:12] Starting Position 1 (o'clock): [1:2] Ending Position 1 (o'clock): [1:2.4] Maximum Distance 1 (cm): [1:Yes] [N/A:N/A] Undermining: [1:Full Thickness Without Exposed] [N/A:N/A] Classification: [1:Support Structures Medium] [N/A:N/A] Exudate Amount: [1:Serosanguineous] [N/A:N/A] Exudate Type: [1:red, brown] [N/A:N/A] Exudate Color: [1:Well defined, not attached] [N/A:N/A] Wound Margin: [1:Large (67-100%)] [N/A:N/A] Granulation Amount: [1:Red] [N/A:N/A] Granulation Quality: [1:None Present (0%)] [N/A:N/A] Necrotic  Amount: [1:Fat Layer (Subcutaneous Tissue): Yes N/A] Exposed Structures: [1:Fascia: No Tendon: No Muscle: No Joint: No Bone: No Small (1-33%)] [N/A:N/A] Epithelialization: [1:Debridement - Excisional] [N/A:N/A] Debridement: [1:10:50] [N/A:N/A] Pre-procedure Verification/Time Out Taken: [1:Lidocaine] [N/A:N/A] Pain Control: [1:Subcutaneous, Slough] [N/A:N/A] Tissue Debrided: [1:Skin/Subcutaneous Tissue] [N/A:N/A] Level: [1:70] [N/A:N/A] Debridement A (sq cm): [1:rea Curette] [N/A:N/A] Instrument: [1:Minimum] [N/A:N/A] Bleeding: [1:Pressure] [N/A:N/A] Hemostasis A chieved: [1:0] [N/A:N/A] Procedural Pain: [1:0] [N/A:N/A] Post Procedural Pain: [1:Procedure was tolerated well] [N/A:N/A] Debridement Treatment Response: [1:10x7x2.76] [N/A:N/A] Post Debridement Measurements L x W x D (cm) [1:151.739] [N/A:N/A] Post Debridement Volume: (cm) [1:Debridement] [N/A:N/A] Treatment Notes Electronic Signature(s) Signed: 03/26/2021 5:24:19 PM By: Rhae Hammock RN Signed: 03/27/2021 7:53:49 AM By: Linton Ham MD Entered By: Linton Ham on 03/26/2021 11:12:14 -------------------------------------------------------------------------------- Multi-Disciplinary Care Plan Details Patient Name: Date of Service: [name] [name] [name], [name] [name] 03/26/2021 10:15 A M Medical Record Number: [id number] Patient Account Number: [id number] Date of Birth/Sex: Treating RN: 26-Jun-1971 (50 y.o. [name], [name] Primary Care Racquel Arkin: Corena Pilgrim Other Clinician: Referring Mackenzy Eisenberg: Treating Rinaldo Macqueen/Extender: Glynn Octave in Treatment: 7 Active Inactive Nutrition Nursing Diagnoses: Potential for alteratiion in Nutrition/Potential for imbalanced nutrition Goals: Patient/caregiver agrees to and verbalizes understanding of need to obtain nutritional consultation Date Initiated: 01/31/2021 Target Resolution Date: 04/18/2021 Goal Status: Active Interventions: Provide education on nutrition Treatment Activities: Education provided on Nutrition : 03/26/2021 Patient referred to Primary Care Physician for further nutritional evaluation : 01/31/2021 Notes: Wound/Skin Impairment Nursing Diagnoses: Knowledge deficit related to ulceration/compromised skin integrity Goals: Patient/caregiver will verbalize understanding of skin care regimen Date Initiated: 01/31/2021 Date Inactivated: 03/11/2021 Target Resolution Date: 02/28/2021 Goal Status: Met Ulcer/skin breakdown will have a volume reduction of 30% by week 4 Date Initiated: 03/11/2021 Date Inactivated: 03/11/2021 Target Resolution Date: 02/28/2021 Goal Status: Met Ulcer/skin breakdown will have a volume reduction of 50% by week 8 Date Initiated: 03/11/2021 Target Resolution Date: 04/25/2021 Goal Status: Active Interventions: Assess patient/caregiver ability to obtain necessary supplies Assess patient/caregiver ability to perform ulcer/skin care regimen upon admission and as needed Provide  education on ulcer and skin care Treatment Activities: Skin care regimen initiated : 01/31/2021 Topical wound management initiated : 01/31/2021 Notes: Electronic Signature(s) Signed: 03/26/2021 5:24:19 PM By: Rhae Hammock RN Entered By: Rhae Hammock on 03/26/2021 11:09:57 -------------------------------------------------------------------------------- Pain Assessment Details Patient Name: Date of Service: [name] [name], [name] [name] 03/26/2021 10:15 A M Medical Record Number: [id number] Patient Account Number: [id number] Date of Birth/Sex: Treating RN: 09/05/1970 (50 y.o. [name], [name] Primary Care Devontay Celaya: Corena Pilgrim Other Clinician: Referring Erynne Kealey: Treating Lynn Sissel/Extender: Glynn Octave in Treatment: 7 Active Problems Location of Pain Severity and Description of Pain Patient Has Paino Yes Site Locations Pain Location: Pain in Ulcers With Dressing Change: Yes Duration of the Pain. Constant / Intermittento Intermittent Rate the pain. Current Pain Level: 10 Worst Pain Level: 10 Least Pain Level: 0 Tolerable Pain Level: 10 Character of Pain Describe the Pain: Aching Pain Management and Medication Current Pain Management: Medication: Yes Cold Application: No Rest: Yes Massage: No Activity: No T.E.N.S.: No Heat Application: No Leg drop or elevation: No Is the Current Pain Management Adequate: Adequate How does your wound impact your activities of daily livingo Sleep: No Bathing: No Appetite: No Relationship With Others: No Bladder Continence: No Emotions: No Bladder Continence: No Emotions: No Bowel Continence: No Work: No Toileting: No Drive: No Dressing: No Hobbies: No Electronic Signature(s) Signed: 03/26/2021 5:24:19 PM By: Rhae Hammock RN Entered By: Rhae Hammock on 03/26/2021 10:23:10 -------------------------------------------------------------------------------- Patient/Caregiver Education Details Patient  Name: Date of Service: [name] [name], [name] [name] 8/30/2022andnbsp10:15 A M Medical Record Number: [id number] Patient Account Number: [id number] Date of Birth/Gender: Treating RN: 26-Jan-1971 (50 y.o. [name], [name] Primary Care Physician: Corena Pilgrim Other Clinician: Referring Physician: Treating Physician/Extender: Glynn Octave in Treatment: 7 Education Assessment Education Provided To: Patient Education Topics Provided Basic Hygiene: Methods: Explain/Verbal Responses: State content correctly Nutrition: Methods: Explain/Verbal Responses: State content correctly Wound/Skin Impairment: Methods: Explain/Verbal Responses: State content correctly Electronic Signature(s) Signed: 03/26/2021 5:24:19 PM By: Rhae Hammock RN Entered By: Rhae Hammock on 03/26/2021 10:43:19 -------------------------------------------------------------------------------- Wound Assessment Details Patient Name: Date of Service: [name] [name], [name] [name] 03/26/2021 10:15 A M Medical Record Number: [id number] Patient Account Number: [id number] Date of Birth/Sex: Treating RN: [birth year] (50 y.o. [name], [name] Primary Care [name] Scavone: Corena Pilgrim Other Clinician: Referring Winifred Balogh: Treating Joselyne Spake/Extender: Glynn Octave in Treatment: 7 Wound Status Wound Number: 1 Primary Infection - not elsewhere classified Etiology: Wound Location: Left Gluteus Wound Status: Open Wounding Event: Other Lesion Comorbid Arrhythmia, Congestive Heart Failure, Type II Diabetes, Date Acquired: 12/23/2020 History: Neuropathy Weeks Of Treatment: 7 Clustered Wound: No Wound Measurements Length: (cm) 10 Width: (cm) 7 Depth: (cm) 2.6 Area: (cm) 54.978 Volume: (cm) 142.942 % Reduction in Area: 45.3% % Reduction in Volume: 52.6% Epithelialization: Small (1-33%) Tunneling: No Undermining: Yes Starting Position (o'clock): 12 Ending Position (o'clock): 2 Maximum  Distance: (cm) 2.4 Wound Description Classification: Full Thickness Without Exposed Support Structures Wound Margin: Well defined, not attached Exudate Amount: Medium Exudate Type: Serosanguineous Exudate Color: red, brown Foul Odor After Cleansing: No Slough/Fibrino No Wound Bed Granulation Amount: Large (67-100%) Exposed Structure Granulation Quality: Red Fascia Exposed: No Necrotic Amount: None Present (0%) Fat Layer (Subcutaneous Tissue) Exposed: Yes Tendon Exposed: No Muscle Exposed: No Joint Exposed: No Bone Exposed: No Treatment Notes Wound #1 (Gluteus) Wound Laterality: Left Cleanser Soap and Water Discharge Instruction: May shower and wash wound with dial antibacterial soap and water prior to dressing change. Wound Cleanser Discharge Instruction: Cleanse the wound with wound cleanser prior to applying a clean dressing using gauze sponges, not tissue or cotton balls. Peri-Wound Care Skin Prep Discharge Instruction: Use skin prep as directed Topical Primary Dressing MediHoney Gel, tube 1.5 (oz) Discharge Instruction: Apply to wound bed as instructed Secondary Dressing Woven Gauze Sponges 2x2 in ABD Pad, 8x10 Discharge Instruction: Apply over primary dressing as directed. Secured With 27M Medipore H Soft Cloth Surgical T 4 x 2 (in/yd) ape Discharge Instruction: Secure dressing with tape as directed. Compression Wrap Compression Stockings Add-Ons Electronic Signature(s) Signed: 03/26/2021 5:24:19 PM By: Rhae Hammock RN Entered By: Rhae Hammock on 03/26/2021 10:29:25 -------------------------------------------------------------------------------- Vitals Details Patient Name: Date of Service: [name] [name], [name] [name] 03/26/2021 10:15 A M Medical Record Number: [id number] Patient Account Number: [id number] Date of Birth/Sex: Treating RN: 06-25-1971 (50 y.o. [name], [name] Primary Care Alayza Pieper: Corena Pilgrim Other Clinician: Referring Francy Mcilvaine: Treating  Maddyx Wieck/Extender: Glynn Octave in Treatment: 7 Vital Signs Time Taken: 10:22 Temperature (F): 98.7 Pulse (bpm): 96 Respiratory Rate (breaths/min): 17 Blood Pressure (mmHg): 135/74 Capillary Blood Glucose (mg/dl): 101 Reference Range: 80 - 120 mg / dl Electronic Signature(s) Signed: 03/26/2021 5:24:19 PM By: Rhae Hammock RN Entered By: Rhae Hammock on 03/26/2021 10:22:30

## 2021-04-09 ENCOUNTER — Encounter (HOSPITAL_BASED_OUTPATIENT_CLINIC_OR_DEPARTMENT_OTHER): Payer: Medicaid Other | Attending: Internal Medicine | Admitting: Internal Medicine

## 2021-04-09 ENCOUNTER — Other Ambulatory Visit: Payer: Self-pay

## 2021-04-09 DIAGNOSIS — E1165 Type 2 diabetes mellitus with hyperglycemia: Secondary | ICD-10-CM | POA: Insufficient documentation

## 2021-04-09 DIAGNOSIS — S31829D Unspecified open wound of left buttock, subsequent encounter: Secondary | ICD-10-CM | POA: Insufficient documentation

## 2021-04-09 DIAGNOSIS — E11622 Type 2 diabetes mellitus with other skin ulcer: Secondary | ICD-10-CM | POA: Diagnosis not present

## 2021-04-09 DIAGNOSIS — X58XXXD Exposure to other specified factors, subsequent encounter: Secondary | ICD-10-CM | POA: Insufficient documentation

## 2021-04-09 DIAGNOSIS — T8131XD Disruption of external operation (surgical) wound, not elsewhere classified, subsequent encounter: Secondary | ICD-10-CM | POA: Insufficient documentation

## 2021-04-09 DIAGNOSIS — E1151 Type 2 diabetes mellitus with diabetic peripheral angiopathy without gangrene: Secondary | ICD-10-CM | POA: Insufficient documentation

## 2021-04-09 DIAGNOSIS — E1140 Type 2 diabetes mellitus with diabetic neuropathy, unspecified: Secondary | ICD-10-CM | POA: Insufficient documentation

## 2021-04-09 DIAGNOSIS — M726 Necrotizing fasciitis: Secondary | ICD-10-CM | POA: Insufficient documentation

## 2021-04-09 DIAGNOSIS — I10 Essential (primary) hypertension: Secondary | ICD-10-CM | POA: Insufficient documentation

## 2021-04-09 DIAGNOSIS — Z7901 Long term (current) use of anticoagulants: Secondary | ICD-10-CM | POA: Insufficient documentation

## 2021-04-09 DIAGNOSIS — I4891 Unspecified atrial fibrillation: Secondary | ICD-10-CM | POA: Insufficient documentation

## 2021-04-09 DIAGNOSIS — Z794 Long term (current) use of insulin: Secondary | ICD-10-CM | POA: Diagnosis not present

## 2021-04-09 NOTE — Progress Notes (Signed)
[name] [name], [name] [name] ([id number]) Visit Report for 04/09/2021 Fall Risk Assessment Details Patient Name: Date of Service: [name] [name], [name] [name] 04/09/2021 10:00 A M Medical Record Number: [id number] Patient Account Number: [id number] Date of Birth/Sex: Treating RN: May 10, 1971 (50 y.o. Roel Cluck Primary Care [name]: Theophilus Kinds Other Clinician: Referring [name]: Treating [name]/Extender: Rockne Coons in Treatment: 9 Fall Risk Assessment Items Have you had 2 or more falls in the last 12 monthso 0 No Have you had any fall that resulted in injury in the last 12 monthso 0 No FALLS RISK SCREEN History of falling - immediate or within 3 months 25 Yes Secondary diagnosis (Do you have 2 or more medical diagnoseso) 15 Yes Ambulatory aid None/bed rest/wheelchair/nurse 0 No Crutches/cane/walker 15 Yes Furniture 0 No Intravenous therapy Access/Saline/Heparin Lock 0 No Gait/Transferring Normal/ bed rest/ wheelchair 0 No Weak (short steps with or without shuffle, stooped but able to lift head while walking, may seek 10 Yes support from furniture) Impaired (short steps with shuffle, may have difficulty arising from chair, head down, impaired 0 No balance) Mental Status Oriented to own ability 0 Yes Electronic Signature(s) Signed: 04/09/2021 4:34:23 PM By: Antonieta Iba Entered By: Antonieta Iba on 04/09/2021 10:25:25

## 2021-04-10 NOTE — Progress Notes (Signed)
AVERLEE, [name] ([id number]) Visit Report for 04/09/2021 Debridement Details Patient Name: Date of Service: [name] [name], [name] [name] 04/09/2021 10:00 A M Medical Record Number: [id number] Patient Account Number: [id number] Date of Birth/Sex: Treating RN: October 25, 1970 (50 y.o. Ardis Rowan, [name] Primary Care Provider: Theophilus Kinds Other Clinician: Referring Provider: Treating Provider/Extender: Rockne Coons in Treatment: 9 Debridement Performed for Assessment: Wound #1 Left Gluteus Performed By: Physician Maxwell Caul., MD Debridement Type: Debridement Level of Consciousness (Pre-procedure): Awake and Alert Pre-procedure Verification/Time Out Yes - 10:53 Taken: Start Time: 10:53 Pain Control: Lidocaine T Area Debrided (L x W): otal 10 (cm) x 4.5 (cm) = 45 (cm) Tissue and other material debrided: Viable, Non-Viable, Slough, Skin: Dermis , Skin: Epidermis, Biofilm, Slough Level: Skin/Epidermis Debridement Description: Selective/Open Wound Instrument: Curette Bleeding: Minimum Hemostasis Achieved: Pressure End Time: 10:53 Procedural Pain: 0 Post Procedural Pain: 0 Response to Treatment: Procedure was tolerated well Level of Consciousness (Post- Awake and Alert procedure): Post Debridement Measurements of Total Wound Length: (cm) 10 Width: (cm) 4.5 Depth: (cm) 2.1 Volume: (cm) 74.22 Character of Wound/Ulcer Post Debridement: Improved Post Procedure Diagnosis Same as Pre-procedure Electronic Signature(s) Signed: 04/09/2021 5:15:03 PM By: Fonnie Mu RN Signed: 04/10/2021 3:40:47 PM By: Baltazar Najjar MD Entered By: Baltazar Najjar on 04/09/2021 12:12:35 -------------------------------------------------------------------------------- HPI Details Patient Name: Date of Service: [name] [name], [name] [name] 04/09/2021 10:00 A M Medical Record Number: [id number] Patient Account Number: [id number] Date of Birth/Sex: Treating RN: November 03, 1970 (50 y.o. Ardis Rowan,  [name] Primary Care Provider: Theophilus Kinds Other Clinician: Referring Provider: Treating Provider/Extender: Rockne Coons in Treatment: 9 History of Present Illness HPI Description: ADMISSION 12/31/2020 This is a 50 year old woman with poorly controlled type 2 diabetes on insulin. Her problem began after a fall in late April. She developed a pimple area on her left buttock that rapidly expanded. She was admitted to hospital from 12/28/2020 through 01/19/2021 initially I believed to [facility] subsequently transferred to [facility] when she developed clear septic shock. 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 Her wound culture grew grew group B strep. She required operative debridements on 6/3 and 6/6 and subsequent bedside debridements as well. I have another culture result that showed E. coli. She was discharged on cephalexin and Augmentin she is long since finished this. She is now at home using Silvadene cream covered with ABDs. Past medical history includes new onset A. fib on Eliquis type 2 diabetes on insulin with a recent hemoglobin A1c of 11.1. As far as I can tell she is eating and drinking well she is taking Ensure at home. She has had significant weight loss dating back prior to this infection I am not exactly sure of the cause here 8/15; patient presents for follow-up. Its been 1 month since patient has last been seen. She is currently using Dakin's wet-to-dry dressings. She has no issues or complaints today. She denies signs of infection. 8/30; this is a patient have not seen in almost 2 months. She has a pressure ulcer on her left buttock and reasonably close proximity to her anal opening. Most of this appears fairly superficial however most proximally there is an area with more depth. She apparently has poorly controlled diabetes. She has been using Dakin's wet-to-dry for about 7 weeks now. She is changing this multiple  times a day her son does the dressing. She says out of concern for urine but this does not look like an area that should be getting contaminated  with urine. She claims not to be incontinent. She has a level 3 pressure relief surface I think by her description 9/13; we attempted to get this patient Medihoney to aid in surface debridement however she still has not gotten this to date and has been using wet-to-dry. Electronic Signature(s) Signed: 04/10/2021 3:40:47 PM By: Baltazar Najjar MD Entered By: Baltazar Najjar on 04/09/2021 12:21:01 -------------------------------------------------------------------------------- Physical Exam Details Patient Name: Date of Service: [name] [name], [name] [name] 04/09/2021 10:00 A M Medical Record Number: [id number] Patient Account Number: [id number] Date of Birth/Sex: Treating RN: 09-Nov-1970 (50 y.o. Ardis Rowan, [name] Primary Care Provider: Theophilus Kinds Other Clinician: Referring Provider: Treating Provider/Extender: Rockne Coons in Treatment: 9 Constitutional Sitting or standing Blood Pressure is within target range for patient.. Pulse regular and within target range for patient.Marland Kitchen Respirations regular, non-labored and within target range.. Temperature is normal and within the target range for the patient.Marland Kitchen Appears in no distress. Notes Wound exam; large wound on the left buttock. I will think this is changed much although it is showing smaller on our surface area measurements. Surface is dry somewhat senescent rolled edges. I used an open curette very fibrinous debris. Very difficult to remove. The patient does not tolerate this well. There is no evidence of surrounding infection Electronic Signature(s) Signed: 04/10/2021 3:40:47 PM By: Baltazar Najjar MD Entered By: Baltazar Najjar on 04/09/2021 12:22:31 -------------------------------------------------------------------------------- Physician Orders Details Patient Name: Date of  Service: [name] [name], [name] 04/09/2021 10:00 A M Medical Record Number: [id number] Patient Account Number: [id number] Date of Birth/Sex: Treating RN: 1971-04-21 (50 y.o. Ardis Rowan, [name] Primary Care Provider: Theophilus Kinds Other Clinician: Referring Provider: Treating Provider/Extender: Rockne Coons in Treatment: 9 Verbal / Phone Orders: No Diagnosis Coding ICD-10 Coding Code Description T81.31XD Disruption of external operation (surgical) wound, not elsewhere classified, subsequent encounter S31.829D Unspecified open wound of left buttock, subsequent encounter M72.6 Necrotizing fasciitis E11.622 Type 2 diabetes mellitus with other skin ulcer Follow-up Appointments ppointment in 2 weeks. - Dr. Leanord Hawking!! Return A Bathing/ Shower/ Hygiene May shower and wash wound with soap and water. Off-Loading Low air-loss mattress (Group 2) - continue to use. Turn and reposition every 2 hours - only up for meals and back to bed. Additional Orders / Instructions Follow Nutritious Diet - increase protein, eat three meals a day. Wound Treatment Wound #1 - Gluteus Wound Laterality: Left Cleanser: Soap and Water (Home Health) 1 x Per Day/30 Days Discharge Instructions: May shower and wash wound with dial antibacterial soap and water prior to dressing change. Cleanser: Wound Cleanser (Home Health) 1 x Per Day/30 Days Discharge Instructions: Cleanse the wound with wound cleanser prior to applying a clean dressing using gauze sponges, not tissue or cotton balls. Peri-Wound Care: Skin Prep (Home Health) 1 x Per Day/30 Days Discharge Instructions: Use skin prep as directed Prim Dressing: MediHoney Gel, tube 1.5 (oz) (Home Health) 1 x Per Day/30 Days ary Discharge Instructions: Apply to wound bed as instructed Secondary Dressing: Woven Gauze Sponges 2x2 in (Home Health) 1 x Per Day/30 Days Secondary Dressing: ABD Pad, 8x10 (Home Health) 1 x Per Day/30 Days Discharge Instructions:  Apply over primary dressing as directed. Secured With: 34M Medipore H Soft Cloth Surgical T 4 x 2 (in/yd) (Home Health) 1 x Per Day/30 Days ape Discharge Instructions: Secure dressing with tape as directed. Electronic Signature(s) Signed: 04/09/2021 5:15:03 PM By: Fonnie Mu RN Signed: 04/10/2021 3:40:47 PM By: Baltazar Najjar MD Entered By: Fonnie Mu on 04/09/2021 10:54:56 -------------------------------------------------------------------------------- Problem  List Details Patient Name: Date of Service: [name] [name], [name] [name] 04/09/2021 10:00 A M Medical Record Number: [id number] Patient Account Number: [id number] Date of Birth/Sex: Treating RN: 02-11-1971 (50 y.o. Roel Cluck Primary Care Provider: Theophilus Kinds Other Clinician: Referring Provider: Treating Provider/Extender: Rockne Coons in Treatment: 9 Active Problems ICD-10 Encounter Code Description Active Date MDM Diagnosis T81.31XD Disruption of external operation (surgical) wound, not elsewhere classified, 01/31/2021 No Yes subsequent encounter S31.829D Unspecified open wound of left buttock, subsequent encounter 01/31/2021 No Yes M72.6 Necrotizing fasciitis 01/31/2021 No Yes E11.622 Type 2 diabetes mellitus with other skin ulcer 01/31/2021 No Yes Inactive Problems Resolved Problems Electronic Signature(s) Signed: 04/10/2021 3:40:47 PM By: Baltazar Najjar MD Entered By: Baltazar Najjar on 04/09/2021 12:12:03 -------------------------------------------------------------------------------- Progress Note Details Patient Name: Date of Service: [name] [name], [name] [name] 04/09/2021 10:00 A M Medical Record Number: [id number] Patient Account Number: [id number] Date of Birth/Sex: Treating RN: Nov 20, 1970 (50 y.o. Ardis Rowan, [name] Primary Care Provider: Theophilus Kinds Other Clinician: Referring Provider: Treating Provider/Extender: Rockne Coons in Treatment: 9 Subjective History of  Present Illness (HPI) ADMISSION 12/31/2020 This is a 50 year old woman with poorly controlled type 2 diabetes on insulin. Her problem began after a fall in late April. She developed a pimple area on her left buttock that rapidly expanded. She was admitted to hospital from 12/28/2020 through 01/19/2021 initially I believed to [facility] subsequently transferred to [facility] when she developed clear septic shock. 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 Her wound culture grew grew group B strep. She required operative debridements on 6/3 and 6/6 and subsequent bedside debridements as well. I have another culture result that showed E. coli. She was discharged on cephalexin and Augmentin she is long since finished this. She is now at home using Silvadene cream covered with ABDs. Past medical history includes new onset A. fib on Eliquis type 2 diabetes on insulin with a recent hemoglobin A1c of 11.1. As far as I can tell she is eating and drinking well she is taking Ensure at home. She has had significant weight loss dating back prior to this infection I am not exactly sure of the cause here 8/15; patient presents for follow-up. Its been 1 month since patient has last been seen. She is currently using Dakin's wet-to-dry dressings. She has no issues or complaints today. She denies signs of infection. 8/30; this is a patient have not seen in almost 2 months. She has a pressure ulcer on her left buttock and reasonably close proximity to her anal opening. Most of this appears fairly superficial however most proximally there is an area with more depth. She apparently has poorly controlled diabetes. She has been using Dakin's wet-to-dry for about 7 weeks now. She is changing this multiple times a day her son does the dressing. She says out of concern for urine but this does not look like an area that should be getting contaminated with urine. She claims not to be incontinent.  She has a level 3 pressure relief surface I think by her description 9/13; we attempted to get this patient Medihoney to aid in surface debridement however she still has not gotten this to date and has been using wet-to-dry. Objective Constitutional Sitting or standing Blood Pressure is within target range for patient.. Pulse regular and within target range for patient.Marland Kitchen Respirations regular, non-labored and within target range.. Temperature is normal and within the target range for the patient.Marland Kitchen Appears in no distress.  Vitals Time Taken: 10:25 AM, Temperature: 98.3 F, Pulse: 96 bpm, Respiratory Rate: 18 breaths/min, Blood Pressure: 145/89 mmHg, Capillary Blood Glucose: 104 mg/dl. General Notes: Wound exam; large wound on the left buttock. I will think this is changed much although it is showing smaller on our surface area measurements. Surface is dry somewhat senescent rolled edges. I used an open curette very fibrinous debris. Very difficult to remove. The patient does not tolerate this well. There is no evidence of surrounding infection Integumentary (Hair, Skin) Wound #1 status is Open. Original cause of wound was Other Lesion. The date acquired was: 12/23/2020. The wound has been in treatment 9 weeks. The wound is located on the Left Gluteus. The wound measures 10cm length x 4.5cm width x 2.1cm depth; 35.343cm^2 area and 74.22cm^3 volume. There is Fat Layer (Subcutaneous Tissue) exposed. There is no tunneling or undermining noted. There is a medium amount of serosanguineous drainage noted. The wound margin is well defined and not attached to the wound base. There is large (67-100%) red granulation within the wound bed. There is no necrotic tissue within the wound bed. Assessment Active Problems ICD-10 Disruption of external operation (surgical) wound, not elsewhere classified, subsequent encounter Unspecified open wound of left buttock, subsequent encounter Necrotizing fasciitis Type 2  diabetes mellitus with other skin ulcer Procedures Wound #1 Pre-procedure diagnosis of Wound #1 is an Infection - not elsewhere classified located on the Left Gluteus . There was a Selective/Open Wound Skin/Epidermis Debridement with a total area of 45 sq cm performed by Maxwell Caul., MD. With the following instrument(s): Curette to remove Viable and Non-Viable tissue/material. Material removed includes Slough, Skin: Dermis, Skin: Epidermis, and Biofilm after achieving pain control using Lidocaine. No specimens were taken. A time out was conducted at 10:53, prior to the start of the procedure. A Minimum amount of bleeding was controlled with Pressure. The procedure was tolerated well with a pain level of 0 throughout and a pain level of 0 following the procedure. Post Debridement Measurements: 10cm length x 4.5cm width x 2.1cm depth; 74.22cm^3 volume. Character of Wound/Ulcer Post Debridement is improved. Post procedure Diagnosis Wound #1: Same as Pre-Procedure Plan Follow-up Appointments: Return Appointment in 2 weeks. - Dr. Leanord Hawking!! Bathing/ Shower/ Hygiene: May shower and wash wound with soap and water. Off-Loading: Low air-loss mattress (Group 2) - continue to use. Turn and reposition every 2 hours - only up for meals and back to bed. Additional Orders / Instructions: Follow Nutritious Diet - increase protein, eat three meals a day. WOUND #1: - Gluteus Wound Laterality: Left Cleanser: Soap and Water (Home Health) 1 x Per Day/30 Days Discharge Instructions: May shower and wash wound with dial antibacterial soap and water prior to dressing change. Cleanser: Wound Cleanser (Home Health) 1 x Per Day/30 Days Discharge Instructions: Cleanse the wound with wound cleanser prior to applying a clean dressing using gauze sponges, not tissue or cotton balls. Peri-Wound Care: Skin Prep (Home Health) 1 x Per Day/30 Days Discharge Instructions: Use skin prep as directed Prim Dressing:  MediHoney Gel, tube 1.5 (oz) (Home Health) 1 x Per Day/30 Days ary Discharge Instructions: Apply to wound bed as instructed Secondary Dressing: Woven Gauze Sponges 2x2 in (Home Health) 1 x Per Day/30 Days Secondary Dressing: ABD Pad, 8x10 (Home Health) 1 x Per Day/30 Days Discharge Instructions: Apply over primary dressing as directed. Secured With: 98M Medipore H Soft Cloth Surgical T 4 x 2 (in/yd) (Home Health) 1 x Per Day/30 Days ape Discharge Instructions: Secure  dressing with tape as directed. 1. The patient will start her Medihoney today. Surface debridement is likely going to need to continue 2. More aggressive debridement next time unfortunately is going to be necessary 3. Perhaps look at this with MolecuLight next visit although there was nothing that looked like it required culturing today Electronic Signature(s) Signed: 04/10/2021 3:40:47 PM By: Baltazar Najjar MD Entered By: Baltazar Najjar on 04/09/2021 12:25:48 -------------------------------------------------------------------------------- SuperBill Details Patient Name: Date of Service: [name] [name], [name] [name] 04/09/2021 Medical Record Number: [id number] Patient Account Number: [id number] Date of Birth/Sex: Treating RN: 09/12/1970 (50 y.o. Ardis Rowan, [name] Primary Care Provider: Theophilus Kinds Other Clinician: Referring Provider: Treating Provider/Extender: Rockne Coons in Treatment: 9 Diagnosis Coding ICD-10 Codes Code Description T81.31XD Disruption of external operation (surgical) wound, not elsewhere classified, subsequent encounter S31.829D Unspecified open wound of left buttock, subsequent encounter M72.6 Necrotizing fasciitis E11.622 Type 2 diabetes mellitus with other skin ulcer Facility Procedures CPT4 Code: [id number] Description: 97597 - DEBRIDE WOUND 1ST 20 SQ CM OR < ICD-10 Diagnosis Description E11.622 Type 2 diabetes mellitus with other skin ulcer Modifier: Quantity: 1 CPT4 Code:  [id number] Description: 97598 - DEBRIDE WOUND EA ADDL 20 SQ CM ICD-10 Diagnosis Description E11.622 Type 2 diabetes mellitus with other skin ulcer Modifier: Quantity: 2 Physician Procedures : CPT4 Code Description Modifier [id number] 97597 - WC PHYS DEBR WO ANESTH 20 SQ CM ICD-10 Diagnosis Description E11.622 Type 2 diabetes mellitus with other skin ulcer Quantity: 1 : [id number] 97598 - WC PHYS DEBR WO ANESTH EA ADD 20 CM ICD-10 Diagnosis Description E11.622 Type 2 diabetes mellitus with other skin ulcer Quantity: 2 Electronic Signature(s) Signed: 04/10/2021 3:40:47 PM By: Baltazar Najjar MD Entered By: Baltazar Najjar on 04/09/2021 12:25:59

## 2021-04-10 NOTE — Progress Notes (Signed)
[name] [name], [name] [name] ([id number]) Visit Report for 04/09/2021 Arrival Information Details Patient Name: Date of Service: [name] [name], [name] [name] 04/09/2021 10:00 [place] Record Number: [id number] Patient Account Number: [id number] Date of Birth/Sex: Treating RN: 01-29-1971 (50 y.o. Sue Lush Primary Care Jhoel Stieg: Corena Pilgrim Other Clinician: Referring Ewan Grau: Treating Farryn Linares/Extender: Glynn Octave in Treatment: 9 Visit Information History Since Last Visit Added or deleted any medications: No Patient Arrived: Ambulatory Any new allergies or adverse reactions: No Arrival Time: 10:23 Had a fall or experienced change in Yes Transfer Assistance: None activities of daily living that may affect Patient Identification Verified: Yes risk of falls: Secondary Verification Process Completed: Yes Signs or symptoms of abuse/neglect since last visito No Patient Requires Transmission-Based Precautions: No Hospitalized since last visit: No Patient Has Alerts: Yes Implantable device outside of the clinic excluding No Patient Alerts: Patient on Blood Thinner cellular tissue based products placed in the center since last visit: Has Dressing in Place as Prescribed: Yes Pain Present Now: No Electronic Signature(s) Signed: 04/09/2021 4:34:23 PM By: Lorrin Jackson Entered By: Lorrin Jackson on 04/09/2021 10:24:56 -------------------------------------------------------------------------------- Encounter Discharge Information Details Patient Name: Date of Service: [name] [name], [name] [name] 04/09/2021 10:00 [place] Record Number: [id number] Patient Account Number: [id number] Date of Birth/Sex: Treating RN: September 17, 1970 (50 y.o. [name] [name], [name] [name] Primary Care Arcola Freshour: Corena Pilgrim Other Clinician: Referring Faige Seely: Treating Narek Kniss/Extender: Glynn Octave in Treatment: 9 Encounter Discharge Information Items Post Procedure Vitals Discharge Condition:  Stable Temperature (F): 97.4 Ambulatory Status: Ambulatory Pulse (bpm): 74 Discharge Destination: Home Respiratory Rate (breaths/min): 17 Transportation: Private Auto Blood Pressure (mmHg): 147/74 Accompanied By: son Schedule Follow-up Appointment: Yes Clinical Summary of Care: Patient Declined Electronic Signature(s) Signed: 04/09/2021 5:15:03 PM By: Rhae Hammock RN Entered By: Rhae Hammock on 04/09/2021 11:04:25 -------------------------------------------------------------------------------- Lower Extremity Assessment Details Patient Name: Date of Service: [name] [name], [name] [name] 04/09/2021 10:00 A M Medical Record Number: [id number] Patient Account Number: [id number] Date of Birth/Sex: Treating RN: 09-20-1970 (50 y.o. Sue Lush Primary Care Daeshon Grammatico: Corena Pilgrim Other Clinician: Referring Wendle Kina: Treating Julee Stoll/Extender: Glynn Octave in Treatment: 9 Electronic Signature(s) Signed: 04/09/2021 4:34:23 PM By: Lorrin Jackson Entered By: Lorrin Jackson on 04/09/2021 10:24:44 -------------------------------------------------------------------------------- Multi Wound Chart Details Patient Name: Date of Service: [name] [name], [name] [name] 04/09/2021 10:00 A M Medical Record Number: [id number] Patient Account Number: [id number] Date of Birth/Sex: Treating RN: 1971-02-20 (50 y.o. [name] [name], [name] [name] Primary Care Desirie Minteer: Corena Pilgrim Other Clinician: Referring Vesna Kable: Treating Deran Barro/Extender: Glynn Octave in Treatment: 9 Vital Signs Height(in): Capillary Blood Glucose(mg/dl): 104 Weight(lbs): Pulse(bpm): 13 Body Mass Index(BMI): Blood Pressure(mmHg): 145/89 Temperature(F): 98.3 Respiratory Rate(breaths/min): 18 Photos: [N/A:N/A] Left Gluteus N/A N/A Wound Location: Other Lesion N/A N/A Wounding Event: Infection - not elsewhere classified N/A N/A Primary Etiology: Arrhythmia, Congestive Heart Failure, N/A  N/A Comorbid History: Type II Diabetes, Neuropathy 12/23/2020 N/A N/A Date Acquired: 9 N/A N/A Weeks of Treatment: Open N/A N/A Wound Status: 10x4.5x2.1 N/A N/A Measurements L x W x D (cm) 35.343 N/A N/A A (cm) : rea 74.22 N/A N/A Volume (cm) : 64.80% N/A N/A % Reduction in Area: 75.40% N/A N/A % Reduction in Volume: Full Thickness Without Exposed N/A N/A Classification: Support Structures Medium N/A N/A Exudate Amount: Serosanguineous N/A N/A Exudate Type: red, brown N/A N/A Exudate Color: Well defined, not attached N/A N/A Wound Margin: Large (67-100%) N/A N/A Granulation Amount: Red N/A N/A Granulation Quality: None Present (0%) N/A N/A Necrotic Amount: Fat Layer (Subcutaneous Tissue):  Yes N/A N/A Exposed Structures: Fascia: No Tendon: No Muscle: No Joint: No Bone: No Small (1-33%) N/A N/A Epithelialization: Debridement - Selective/Open Wound N/A N/A Debridement: Pre-procedure Verification/Time Out 10:53 N/A N/A Taken: Lidocaine N/A N/A Pain Control: Slough N/A N/A Tissue Debrided: Skin/Epidermis N/A N/A Level: 45 N/A N/A Debridement A (sq cm): rea Curette N/A N/A Instrument: Minimum N/A N/A Bleeding: Pressure N/A N/A Hemostasis A chieved: 0 N/A N/A Procedural Pain: 0 N/A N/A Post Procedural Pain: Procedure was tolerated well N/A N/A Debridement Treatment Response: 10x4.5x2.1 N/A N/A Post Debridement Measurements L x W x D (cm) 74.22 N/A N/A Post Debridement Volume: (cm) Debridement N/A N/A Procedures Performed: Treatment Notes Wound #1 (Gluteus) Wound Laterality: Left Cleanser Soap and Water Discharge Instruction: May shower and wash wound with dial antibacterial soap and water prior to dressing change. Wound Cleanser Discharge Instruction: Cleanse the wound with wound cleanser prior to applying a clean dressing using gauze sponges, not tissue or cotton balls. Peri-Wound Care Skin Prep Discharge Instruction: Use skin prep as  directed Topical Primary Dressing MediHoney Gel, tube 1.5 (oz) Discharge Instruction: Apply to wound bed as instructed Secondary Dressing Woven Gauze Sponges 2x2 in ABD Pad, 8x10 Discharge Instruction: Apply over primary dressing as directed. Secured With 110M Medipore H Soft Cloth Surgical T 4 x 2 (in/yd) ape Discharge Instruction: Secure dressing with tape as directed. Compression Wrap Compression Stockings Add-Ons Electronic Signature(s) Signed: 04/09/2021 5:15:03 PM By: Rhae Hammock RN Signed: 04/10/2021 3:40:47 PM By: Linton Ham MD Entered By: Linton Ham on 04/09/2021 12:12:20 -------------------------------------------------------------------------------- Multi-Disciplinary Care Plan Details Patient Name: Date of Service: [name] [name], [name] [name] 04/09/2021 10:00 [place] Record Number: [id number] Patient Account Number: [id number] Date of Birth/Sex: Treating RN: Nov 27, 1970 (50 y.o. Sue Lush Primary Care Tanner Yeley: Other Clinician: Corena Pilgrim Referring Tajia Szeliga: Treating Cambria Osten/Extender: Glynn Octave in Treatment: 9 Active Inactive Nutrition Nursing Diagnoses: Potential for alteratiion in Nutrition/Potential for imbalanced nutrition Goals: Patient/caregiver agrees to and verbalizes understanding of need to obtain nutritional consultation Date Initiated: 01/31/2021 Target Resolution Date: 04/18/2021 Goal Status: Active Interventions: Provide education on nutrition Treatment Activities: Education provided on Nutrition : 03/11/2021 Patient referred to Primary Care Physician for further nutritional evaluation : 01/31/2021 Notes: Wound/Skin Impairment Nursing Diagnoses: Knowledge deficit related to ulceration/compromised skin integrity Goals: Patient/caregiver will verbalize understanding of skin care regimen Date Initiated: 01/31/2021 Date Inactivated: 03/11/2021 Target Resolution Date: 02/28/2021 Goal Status: Met Ulcer/skin  breakdown will have a volume reduction of 30% by week 4 Date Initiated: 03/11/2021 Date Inactivated: 03/11/2021 Target Resolution Date: 02/28/2021 Goal Status: Met Ulcer/skin breakdown will have a volume reduction of 50% by week 8 Date Initiated: 03/11/2021 Target Resolution Date: 04/25/2021 Goal Status: Active Interventions: Assess patient/caregiver ability to obtain necessary supplies Assess patient/caregiver ability to perform ulcer/skin care regimen upon admission and as needed Provide education on ulcer and skin care Treatment Activities: Skin care regimen initiated : 01/31/2021 Topical wound management initiated : 01/31/2021 Notes: Electronic Signature(s) Signed: 04/09/2021 4:34:23 PM By: Lorrin Jackson Entered By: Lorrin Jackson on 04/09/2021 10:26:27 -------------------------------------------------------------------------------- Pain Assessment Details Patient Name: Date of Service: [name] [name], [name] [name] 04/09/2021 10:00 [place] Record Number: [id number] Patient Account Number: [id number] Date of Birth/Sex: Treating RN: [birth year] (50 y.o. Sue Lush Primary Care Rue Tinnel: Corena Pilgrim Other Clinician: Referring Yoshito Gaza: Treating Huckleberry Martinson/Extender: Glynn Octave in Treatment: 9 Active Problems Location of Pain Severity and Description of Pain Patient Has Paino No Site Locations Pain Management and Medication Current Pain Management: Electronic  Signature(s) Signed: 04/09/2021 4:34:23 PM By: Lorrin Jackson Entered By: Lorrin Jackson on 04/09/2021 10:24:36 -------------------------------------------------------------------------------- Patient/Caregiver Education Details Patient Name: Date of Service: [name] [name], [name] [name] 9/13/2022andnbsp10:00 [place] Record Number: [id number] Patient Account Number: [id number] Date of Birth/Gender: Treating RN: 03-Jan-1971 (50 y.o. Sue Lush Primary Care Physician: Corena Pilgrim Other  Clinician: Referring Physician: Treating Physician/Extender: Glynn Octave in Treatment: 9 Education Assessment Education Provided To: Patient Education Topics Provided Pressure: Methods: Explain/Verbal Responses: State content correctly Wound/Skin Impairment: Methods: Explain/Verbal, Printed Responses: State content correctly Electronic Signature(s) Signed: 04/09/2021 4:34:23 PM By: Lorrin Jackson Entered By: Lorrin Jackson on 04/09/2021 10:26:47 -------------------------------------------------------------------------------- Wound Assessment Details Patient Name: Date of Service: [name] [name], [name] [name] 04/09/2021 10:00 A M Medical Record Number: [id number] Patient Account Number: [id number] Date of Birth/Sex: Treating RN: 04/07/1971 (50 y.o. Sue Lush Primary Care Jhoselyn Ruffini: Corena Pilgrim Other Clinician: Referring Nazario Russom: Treating Guido Comp/Extender: Glynn Octave in Treatment: 9 Wound Status Wound Number: 1 Primary Infection - not elsewhere classified Etiology: Wound Location: Left Gluteus Wound Status: Open Wounding Event: Other Lesion Comorbid Arrhythmia, Congestive Heart Failure, Type II Diabetes, Date Acquired: 12/23/2020 History: Neuropathy Weeks Of Treatment: 9 Clustered Wound: No Photos Wound Measurements Length: (cm) 10 Width: (cm) 4.5 Depth: (cm) 2.1 Area: (cm) 35.343 Volume: (cm) 74.22 % Reduction in Area: 64.8% % Reduction in Volume: 75.4% Epithelialization: Small (1-33%) Tunneling: No Undermining: No Wound Description Classification: Full Thickness Without Exposed Support Structures Wound Margin: Well defined, not attached Exudate Amount: Medium Exudate Type: Serosanguineous Exudate Color: red, brown Foul Odor After Cleansing: No Slough/Fibrino No Wound Bed Granulation Amount: Large (67-100%) Exposed Structure Granulation Quality: Red Fascia Exposed: No Necrotic Amount: None Present (0%) Fat  Layer (Subcutaneous Tissue) Exposed: Yes Tendon Exposed: No Muscle Exposed: No Joint Exposed: No Bone Exposed: No Treatment Notes Wound #1 (Gluteus) Wound Laterality: Left Cleanser Soap and Water Discharge Instruction: May shower and wash wound with dial antibacterial soap and water prior to dressing change. Wound Cleanser Discharge Instruction: Cleanse the wound with wound cleanser prior to applying a clean dressing using gauze sponges, not tissue or cotton balls. Peri-Wound Care Skin Prep Discharge Instruction: Use skin prep as directed Topical Primary Dressing MediHoney Gel, tube 1.5 (oz) Discharge Instruction: Apply to wound bed as instructed Secondary Dressing Woven Gauze Sponges 2x2 in ABD Pad, 8x10 Discharge Instruction: Apply over primary dressing as directed. Secured With 15M Medipore H Soft Cloth Surgical T 4 x 2 (in/yd) ape Discharge Instruction: Secure dressing with tape as directed. Compression Wrap Compression Stockings Add-Ons Electronic Signature(s) Signed: 04/09/2021 10:48:45 AM By: Sandre Kitty Signed: 04/09/2021 4:34:23 PM By: Lorrin Jackson Entered By: Sandre Kitty on 04/09/2021 10:31:17 -------------------------------------------------------------------------------- Vitals Details Patient Name: Date of Service: [name] [name], [name] [name] 04/09/2021 10:00 A M Medical Record Number: [id number] Patient Account Number: [id number] Date of Birth/Sex: Treating RN: 1970/08/30 (50 y.o. Sue Lush Primary Care Alexsis Branscom: Corena Pilgrim Other Clinician: Referring Silvia Hightower: Treating Daija Routson/Extender: Glynn Octave in Treatment: 9 Vital Signs Time Taken: 10:25 Temperature (F): 98.3 Pulse (bpm): 96 Respiratory Rate (breaths/min): 18 Blood Pressure (mmHg): 145/89 Capillary Blood Glucose (mg/dl): 104 Reference Range: 80 - 120 mg / dl Electronic Signature(s) Signed: 04/09/2021 4:34:23 PM By: Lorrin Jackson Entered By: Lorrin Jackson  on 04/09/2021 10:26:03

## 2021-04-23 ENCOUNTER — Encounter (HOSPITAL_BASED_OUTPATIENT_CLINIC_OR_DEPARTMENT_OTHER): Payer: Medicaid Other | Admitting: Internal Medicine

## 2021-04-23 ENCOUNTER — Other Ambulatory Visit: Payer: Self-pay

## 2021-04-23 DIAGNOSIS — T8131XD Disruption of external operation (surgical) wound, not elsewhere classified, subsequent encounter: Secondary | ICD-10-CM | POA: Diagnosis not present

## 2021-04-23 NOTE — Progress Notes (Signed)
[name] [name], [name] [name] ([id number]) Visit Report for 04/23/2021 HPI Details Patient Name: Date of Service: [name] [name] 04/23/2021 10:00 A M Medical Record Number: [id number] Patient Account Number: [id number] Date of Birth/Sex: Treating RN: Apr 14, 1971 (50 y.o. Ardis Rowan, [name] Primary Care Provider: Theophilus Kinds Other Clinician: Referring Provider: Treating Provider/Extender: Rockne Coons in Treatment: 11 History of Present Illness HPI Description: ADMISSION 12/31/2020 This is a 50 year old woman with poorly controlled type 2 diabetes on insulin. Her problem began after a fall in late April. She developed a pimple area on her left buttock that rapidly expanded. She was admitted to hospital from 12/28/2020 through 01/19/2021 initially I believed to [facility] subsequently transferred to [facility] when she developed clear septic shock. 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 Her wound culture grew grew group B strep. She required operative debridements on 6/3 and 6/6 and subsequent bedside debridements as well. I have another culture result that showed E. coli. She was discharged on cephalexin and Augmentin she is long since finished this. She is now at home using Silvadene cream covered with ABDs. Past medical history includes new onset A. fib on Eliquis type 2 diabetes on insulin with a recent hemoglobin A1c of 11.1. As far as I can tell she is eating and drinking well she is taking Ensure at home. She has had significant weight loss dating back prior to this infection I am not exactly sure of the cause here 8/15; patient presents for follow-up. Its been 1 month since patient has last been seen. She is currently using Dakin's wet-to-dry dressings. She has no issues or complaints today. She denies signs of infection. 8/30; this is a patient have not seen in almost 2 months. She has a pressure ulcer on her left buttock and reasonably  close proximity to her anal opening. Most of this appears fairly superficial however most proximally there is an area with more depth. She apparently has poorly controlled diabetes. She has been using Dakin's wet-to-dry for about 7 weeks now. She is changing this multiple times a day her son does the dressing. She says out of concern for urine but this does not look like an area that should be getting contaminated with urine. She claims not to be incontinent. She has a level 3 pressure relief surface I think by her description 9/13; we attempted to get this patient Medihoney to aid in surface debridement however she still has not gotten this to date and has been using wet-to-dry. 9/27; patient is using Medihoney gauze to the wound surface. They are apparently changing this 2-3 times a day due to difficulty keeping dressings in place. This is on the left buttock in close proximity to the gluteal cleft. The wound measures smaller and certainly has a much cleaner surface than I was expecting to see. Electronic Signature(s) Signed: 04/23/2021 4:21:38 PM By: Baltazar Najjar MD Entered By: Baltazar Najjar on 04/23/2021 10:47:39 -------------------------------------------------------------------------------- Physical Exam Details Patient Name: Date of Service: [name] [name] 04/23/2021 10:00 A M Medical Record Number: [id number] Patient Account Number: [id number] Date of Birth/Sex: Treating RN: 1970-11-08 (50 y.o. Ardis Rowan, [name] Primary Care Provider: Theophilus Kinds Other Clinician: Referring Provider: Treating Provider/Extender: Rockne Coons in Treatment: 11 Constitutional Patient is hypertensive.. Pulse regular and within target range for patient.Marland Kitchen Respirations regular, non-labored and within target range.. Temperature is normal and within the target range for the patient.Marland Kitchen Appears in no distress. Notes Wound exam; left buttock wound.  The inferior part of this wound  still has depth although the length and width are better. The surface of the wound is considerably better. No evidence of surrounding infection Electronic Signature(s) Signed: 04/23/2021 4:21:38 PM By: Baltazar Najjar MD Entered By: Baltazar Najjar on 04/23/2021 10:50:31 -------------------------------------------------------------------------------- Physician Orders Details Patient Name: Date of Service: [name], Kaleen 04/23/2021 10:00 A M Medical Record Number: [id number] Patient Account Number: [id number] Date of Birth/Sex: Treating RN: 23-May-1971 (50 y.o. Roel Cluck Primary Care Provider: Theophilus Kinds Other Clinician: Referring Provider: Treating Provider/Extender: Rockne Coons in Treatment: 11 Verbal / Phone Orders: No Diagnosis Coding ICD-10 Coding Code Description T81.31XD Disruption of external operation (surgical) wound, not elsewhere classified, subsequent encounter S31.829D Unspecified open wound of left buttock, subsequent encounter M72.6 Necrotizing fasciitis E11.622 Type 2 diabetes mellitus with other skin ulcer Follow-up Appointments ppointment in 2 weeks. - Dr. Leanord Hawking Return A Bathing/ Shower/ Hygiene May shower and wash wound with soap and water. Off-Loading Low air-loss mattress (Group 2) - continue to use. Turn and reposition every 2 hours - only up for meals and back to bed. Additional Orders / Instructions Follow Nutritious Diet - increase protein, eat three meals a day. Wound Treatment Wound #1 - Gluteus Wound Laterality: Left Cleanser: Soap and Water (Home Health) 1 x Per Day/30 Days Discharge Instructions: May shower and wash wound with dial antibacterial soap and water prior to dressing change. Cleanser: Wound Cleanser (Home Health) 1 x Per Day/30 Days Discharge Instructions: Cleanse the wound with wound cleanser prior to applying a clean dressing using gauze sponges, not tissue or cotton balls. Peri-Wound Care: Skin  Prep (Home Health) 1 x Per Day/30 Days Discharge Instructions: Use skin prep as directed Prim Dressing: MediHoney Gel, tube 1.5 (oz) (Home Health) 1 x Per Day/30 Days ary Discharge Instructions: Apply to wound bed as instructed Secondary Dressing: Woven Gauze Sponges 2x2 in (Home Health) 1 x Per Day/30 Days Secondary Dressing: ABD Pad, 8x10 (Home Health) 1 x Per Day/30 Days Discharge Instructions: Apply over primary dressing as directed. Secured With: 23M Medipore H Soft Cloth Surgical T 4 x 2 (in/yd) (Home Health) 1 x Per Day/30 Days ape Discharge Instructions: Secure dressing with tape as directed. Electronic Signature(s) Signed: 04/23/2021 4:21:38 PM By: Baltazar Najjar MD Signed: 04/23/2021 4:47:08 PM By: Antonieta Iba Entered By: Antonieta Iba on 04/23/2021 10:42:31 -------------------------------------------------------------------------------- Problem List Details Patient Name: Date of Service: [name], Reverie 04/23/2021 10:00 A M Medical Record Number: [id number] Patient Account Number: [id number] Date of Birth/Sex: Treating RN: 03-20-1971 (50 y.o. Roel Cluck Primary Care Provider: Theophilus Kinds Other Clinician: Referring Provider: Treating Provider/Extender: Rockne Coons in Treatment: 11 Active Problems ICD-10 Encounter Code Description Active Date MDM Diagnosis T81.31XD Disruption of external operation (surgical) wound, not elsewhere classified, 01/31/2021 No Yes subsequent encounter S31.829D Unspecified open wound of left buttock, subsequent encounter 01/31/2021 No Yes M72.6 Necrotizing fasciitis 01/31/2021 No Yes E11.622 Type 2 diabetes mellitus with other skin ulcer 01/31/2021 No Yes Inactive Problems Resolved Problems Electronic Signature(s) Signed: 04/23/2021 4:21:38 PM By: Baltazar Najjar MD Entered By: Baltazar Najjar on 04/23/2021 10:46:13 -------------------------------------------------------------------------------- Progress Note  Details Patient Name: Date of Service: [name], Manuela 04/23/2021 10:00 A M Medical Record Number: [id number] Patient Account Number: [id number] Date of Birth/Sex: Treating RN: [birth year] (50 y.o. Ardis Rowan, [name] Primary Care Provider: Theophilus Kinds Other Clinician: Referring Provider: Treating Provider/Extender: Rockne Coons in Treatment: 11 Subjective History of Present Illness (HPI) ADMISSION 12/31/2020 This  is a 50 year old woman with poorly controlled type 2 diabetes on insulin. Her problem began after a fall in late April. She developed a pimple area on her left buttock that rapidly expanded. She was admitted to hospital from 12/28/2020 through 01/19/2021 initially I believed to [facility] subsequently transferred to [facility] when she developed clear septic shock. 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 Her wound culture grew grew group B strep. She required operative debridements on 6/3 and 6/6 and subsequent bedside debridements as well. I have another culture result that showed E. coli. She was discharged on cephalexin and Augmentin she is long since finished this. She is now at home using Silvadene cream covered with ABDs. Past medical history includes new onset A. fib on Eliquis type 2 diabetes on insulin with a recent hemoglobin A1c of 11.1. As far as I can tell she is eating and drinking well she is taking Ensure at home. She has had significant weight loss dating back prior to this infection I am not exactly sure of the cause here 8/15; patient presents for follow-up. Its been 1 month since patient has last been seen. She is currently using Dakin's wet-to-dry dressings. She has no issues or complaints today. She denies signs of infection. 8/30; this is a patient have not seen in almost 2 months. She has a pressure ulcer on her left buttock and reasonably close proximity to her anal opening. Most of this appears  fairly superficial however most proximally there is an area with more depth. She apparently has poorly controlled diabetes. She has been using Dakin's wet-to-dry for about 7 weeks now. She is changing this multiple times a day her son does the dressing. She says out of concern for urine but this does not look like an area that should be getting contaminated with urine. She claims not to be incontinent. She has a level 3 pressure relief surface I think by her description 9/13; we attempted to get this patient Medihoney to aid in surface debridement however she still has not gotten this to date and has been using wet-to-dry. 9/27; patient is using Medihoney gauze to the wound surface. They are apparently changing this 2-3 times a day due to difficulty keeping dressings in place. This is on the left buttock in close proximity to the gluteal cleft. The wound measures smaller and certainly has a much cleaner surface than I was expecting to see. Objective Constitutional Patient is hypertensive.. Pulse regular and within target range for patient.Marland Kitchen Respirations regular, non-labored and within target range.. Temperature is normal and within the target range for the patient.Marland Kitchen Appears in no distress. Vitals Time Taken: 10:25 AM, Temperature: 97.8 F, Pulse: 87 bpm, Respiratory Rate: 18 breaths/min, Blood Pressure: 164/90 mmHg, Capillary Blood Glucose: 114 mg/dl. General Notes: Wound exam; left buttock wound. The inferior part of this wound still has depth although the length and width are better. The surface of the wound is considerably better. No evidence of surrounding infection Integumentary (Hair, Skin) Wound #1 status is Open. Original cause of wound was Other Lesion. The date acquired was: 12/23/2020. The wound has been in treatment 11 weeks. The wound is located on the Left Gluteus. The wound measures 9.2cm length x 3.8cm width x 2.2cm depth; 27.458cm^2 area and 60.407cm^3 volume. There is Fat  Layer (Subcutaneous Tissue) exposed. There is no tunneling noted, however, there is undermining starting at 4:00 and ending at 7:00 with a maximum distance of 0.3cm. There is  a medium amount of serosanguineous drainage noted. The wound margin is well defined and not attached to the wound base. There is large (67- 100%) red granulation within the wound bed. There is no necrotic tissue within the wound bed. Assessment Active Problems ICD-10 Disruption of external operation (surgical) wound, not elsewhere classified, subsequent encounter Unspecified open wound of left buttock, subsequent encounter Necrotizing fasciitis Type 2 diabetes mellitus with other skin ulcer Plan Follow-up Appointments: Return Appointment in 2 weeks. - Dr. Leanord Hawking Bathing/ Shower/ Hygiene: May shower and wash wound with soap and water. Off-Loading: Low air-loss mattress (Group 2) - continue to use. Turn and reposition every 2 hours - only up for meals and back to bed. Additional Orders / Instructions: Follow Nutritious Diet - increase protein, eat three meals a day. WOUND #1: - Gluteus Wound Laterality: Left Cleanser: Soap and Water (Home Health) 1 x Per Day/30 Days Discharge Instructions: May shower and wash wound with dial antibacterial soap and water prior to dressing change. Cleanser: Wound Cleanser (Home Health) 1 x Per Day/30 Days Discharge Instructions: Cleanse the wound with wound cleanser prior to applying a clean dressing using gauze sponges, not tissue or cotton balls. Peri-Wound Care: Skin Prep (Home Health) 1 x Per Day/30 Days Discharge Instructions: Use skin prep as directed Prim Dressing: MediHoney Gel, tube 1.5 (oz) (Home Health) 1 x Per Day/30 Days ary Discharge Instructions: Apply to wound bed as instructed Secondary Dressing: Woven Gauze Sponges 2x2 in (Home Health) 1 x Per Day/30 Days Secondary Dressing: ABD Pad, 8x10 (Home Health) 1 x Per Day/30 Days Discharge Instructions: Apply over primary  dressing as directed. Secured With: 57M Medipore H Soft Cloth Surgical T 4 x 2 (in/yd) (Home Health) 1 x Per Day/30 Days ape Discharge Instructions: Secure dressing with tape as directed. 1. Continue with the Medihoney. This was a lot better than I was expecting to see 2. Dimensions are better. 3. Surprisingly no mechanical debridement required 4. This was initially a surgical wound for a necrotizing infection. 5. The patient reports weakness and numbness of her right greater than left leg making it difficult for her to walk. She came in here on a stretcher. I wondered if she would benefit from some physical therapy and I further wondered about the exact etiology of her lower extremity dysfunction. Electronic Signature(s) Signed: 04/23/2021 4:21:38 PM By: Baltazar Najjar MD Entered By: Baltazar Najjar on 04/23/2021 10:53:01 -------------------------------------------------------------------------------- SuperBill Details Patient Name: Date of Service: LEGNA, MAUSOLF 04/23/2021 Medical Record Number: [id number] Patient Account Number: [id number] Date of Birth/Sex: Treating RN: Aug 24, 1970 (50 y.o. Roel Cluck Primary Care Provider: Theophilus Kinds Other Clinician: Referring Provider: Treating Provider/Extender: Rockne Coons in Treatment: 11 Diagnosis Coding ICD-10 Codes Code Description T81.31XD Disruption of external operation (surgical) wound, not elsewhere classified, subsequent encounter S31.829D Unspecified open wound of left buttock, subsequent encounter M72.6 Necrotizing fasciitis E11.622 Type 2 diabetes mellitus with other skin ulcer Facility Procedures CPT4 Code: [id number] Description: 99213 - WOUND CARE VISIT-LEV 3 EST PT Modifier: Quantity: 1 Physician Procedures : CPT4 Code Description Modifier [id number] 99213 - WC PHYS LEVEL 3 - EST PT ICD-10 Diagnosis Description T81.31XD Disruption of external operation (surgical) wound, not elsewhere classified,  subsequent encounter S31.829D Unspecified open wound of left  buttock, subsequent encounter M72.6 Necrotizing fasciitis Quantity: 1 Electronic Signature(s) Signed: 04/23/2021 4:21:38 PM By: Baltazar Najjar MD Entered By: Baltazar Najjar on 04/23/2021 10:53:19

## 2021-04-24 NOTE — Progress Notes (Signed)
RHIANNA, RAULERSON ([id number]) Visit Report for 04/23/2021 Arrival Information Details Patient Name: Date of Service: [name] [name], [name] [name] 04/23/2021 10:00 [place] Record Number: [id number] Patient Account Number: [id number] Date of Birth/Sex: Treating RN: Dec 15, 1970 (50 y.o. Sue Lush Primary Care Juanantonio Stolar: Corena Pilgrim Other Clinician: Referring Janal Haak: Treating Asier Desroches/Extender: Glynn Octave in Treatment: 11 Visit Information History Since Last Visit Added or deleted any medications: No Patient Arrived: Stretcher Any new allergies or adverse reactions: No Arrival Time: 10:17 Had a fall or experienced change in No Transfer Assistance: Manual activities of daily living that may affect Patient Requires Transmission-Based Precautions: No risk of falls: Patient Has Alerts: Yes Signs or symptoms of abuse/neglect since last visito No Patient Alerts: Patient on Blood Thinner Hospitalized since last visit: No Implantable device outside of the clinic excluding No cellular tissue based products placed in the center since last visit: Has Dressing in Place as Prescribed: Yes Pain Present Now: Yes Electronic Signature(s) Signed: 04/23/2021 4:47:08 PM By: Lorrin Jackson Entered By: Lorrin Jackson on 04/23/2021 10:26:27 -------------------------------------------------------------------------------- Clinic Level of Care Assessment Details Patient Name: Date of Service: [name] [name], [name] [name] 04/23/2021 10:00 A M Medical Record Number: [id number] Patient Account Number: [id number] Date of Birth/Sex: Treating RN: [birth year] (50 y.o. Sue Lush Primary Care Lajoyce Tamura: Corena Pilgrim Other Clinician: Referring Acxel Dingee: Treating Viyaan Champine/Extender: Glynn Octave in Treatment: 11 Clinic Level of Care Assessment Items TOOL 4 Quantity Score X- 1 0 Use when only an EandM is performed on FOLLOW-UP visit ASSESSMENTS - Nursing Assessment /  Reassessment X- 1 10 Reassessment of Co-morbidities (includes updates in patient status) X- 1 5 Reassessment of Adherence to Treatment Plan ASSESSMENTS - Wound and Skin A ssessment / Reassessment X - Simple Wound Assessment / Reassessment - one wound 1 5 _0  - 0 Complex Wound Assessment / Reassessment - multiple wounds _1  - 0 Dermatologic / Skin Assessment (not related to wound area) ASSESSMENTS - Focused Assessment _2  - 0 Circumferential Edema Measurements - multi extremities _3  - 0 Nutritional Assessment / Counseling / Intervention _4  - 0 Lower Extremity Assessment (monofilament, tuning fork, pulses) _5  - 0 Peripheral Arterial Disease Assessment (using hand held doppler) ASSESSMENTS - Ostomy and/or Continence Assessment and Care _6  - 0 Incontinence Assessment and Management _7  - 0 Ostomy Care Assessment and Management (repouching, etc.) PROCESS - Coordination of Care _8  - 0 Simple Patient / Family Education for ongoing care X- 1 20 Complex (extensive) Patient / Family Education for ongoing care X- 1 10 Staff obtains Programmer, systems, Records, T Results / Process Orders est _9  - 0 Staff telephones HHA, Nursing Homes / Clarify orders / etc _10  - 0 Routine Transfer to another Facility (non-emergent condition) _11  - 0 Routine Hospital Admission (non-emergent condition) _12  - 0 New Admissions / Biomedical engineer / Ordering NPWT Apligraf, etc. , _13  - 0 Emergency Hospital Admission (emergent condition) _14  - 0 Simple Discharge Coordination _15  - 0 Complex (extensive) Discharge Coordination PROCESS - Special Needs _16  - 0 Pediatric / Minor Patient Management _17  - 0 Isolation Patient Management _18  - 0 Hearing / Language / Visual special needs _19  - 0 Assessment of Community assistance (transportation, D/C planning, etc.) _20  - 0 Additional assistance / Altered mentation _21  - 0 Support Surface(s) Assessment (bed, cushion, seat, etc.) INTERVENTIONS - Wound Cleansing /  Measurement X - Simple Wound Cleansing - one wound 1 5 _22  - 0 Complex Wound Cleansing - multiple wounds X- 1 5 Wound Imaging (photographs - any number of  wounds) _0  - 0 Wound Tracing (instead of photographs) X- 1 5 Simple Wound Measurement - one wound _1  - 0 Complex Wound Measurement - multiple wounds INTERVENTIONS - Wound Dressings _2  - 0 Small Wound Dressing one or multiple wounds X- 1 15 Medium Wound Dressing one or multiple wounds _3  - 0 Large Wound Dressing one or multiple wounds X- 1 5 Application of Medications - topical <WYOVZCHYIFOYDXAJ>_2<\/INOMVEHMCNOBSJGG>_8  - 0 Application of Medications - injection INTERVENTIONS - Miscellaneous _5  - 0 External ear exam _6  - 0 Specimen Collection (cultures, biopsies, blood, body fluids, etc.) _7  - 0 Specimen(s) / Culture(s) sent or taken to Lab for analysis _8  - 0 Patient Transfer (multiple staff / Harrel Lemon Lift / Similar devices) _9  - 0 Simple Staple / Suture removal (25 or less) _10  - 0 Complex Staple / Suture removal (26 or more) _11  - 0 Hypo / Hyperglycemic Management (close monitor of Blood Glucose) _12  - 0 Ankle / Brachial Index (ABI) - do not check if billed separately X- 1 5 Vital Signs Has the patient been seen at the hospital within the last three years: Yes Total Score: 90 Level Of Care: New/Established - Level 3 Electronic Signature(s) Signed: 04/23/2021 4:47:08 PM By: Lorrin Jackson Entered By: Lorrin Jackson on 04/23/2021 10:43:47 -------------------------------------------------------------------------------- Encounter Discharge Information Details Patient Name: Date of Service: [name] [name], [name] [name] 04/23/2021 10:00 A M Medical Record Number: [id number] Patient Account Number: [id number] Date of Birth/Sex: Treating RN: 08-01-1970 (50 y.o. Sue Lush Primary Care Tenae Graziosi: Corena Pilgrim Other Clinician: Referring Emmah Bratcher: Treating Rubens Cranston/Extender: Glynn Octave in Treatment: 11 Encounter Discharge Information  Items Discharge Condition: Stable Ambulatory Status: Stretcher Discharge Destination: Home Transportation: Ambulance Schedule Follow-up Appointment: Yes Clinical Summary of Care: Provided on 04/23/2021 Form Type Recipient Paper Patient Patient Electronic Signature(s) Signed: 04/23/2021 11:58:37 AM By: Lorrin Jackson Entered By: Lorrin Jackson on 04/23/2021 11:58:36 -------------------------------------------------------------------------------- Lower Extremity Assessment Details Patient Name: Date of Service: [name] [name], [name] [name] 04/23/2021 10:00 A M Medical Record Number: [id number] Patient Account Number: [id number] Date of Birth/Sex: Treating RN: Dec 26, 1970 (50 y.o. Sue Lush Primary Care Ranveer Wahlstrom: Corena Pilgrim Other Clinician: Referring Tyreece Gelles: Treating Joyanne Eddinger/Extender: Glynn Octave in Treatment: 11 Electronic Signature(s) Signed: 04/23/2021 4:47:08 PM By: Lorrin Jackson Entered By: Lorrin Jackson on 04/23/2021 10:26:40 -------------------------------------------------------------------------------- Multi Wound Chart Details Patient Name: Date of Service: [name] [name], [name] [name] 04/23/2021 10:00 A M Medical Record Number: [id number] Patient Account Number: [id number] Date of Birth/Sex: Treating RN: 04/26/1971 (50 y.o. [name], [name] Primary Care Britian Jentz: Corena Pilgrim Other Clinician: Referring Collan Schoenfeld: Treating Tylicia Sherman/Extender: Glynn Octave in Treatment: 11 Vital Signs Height(in): Capillary Blood Glucose(mg/dl): 114 Weight(lbs): Pulse(bpm): 46 Body Mass Index(BMI): Blood Pressure(mmHg): 164/90 Temperature(F): 97.8 Respiratory Rate(breaths/min): 18 Photos: [N/A:N/A] Left Gluteus N/A N/A Wound Location: Other Lesion N/A N/A Wounding Event: Infection - not elsewhere classified N/A N/A Primary Etiology: Arrhythmia, Congestive Heart Failure, N/A N/A Comorbid History: Type II Diabetes, Neuropathy 12/23/2020  N/A N/A Date Acquired: 11 N/A N/A Weeks of Treatment: Open N/A N/A Wound Status: 9.2x3.8x2.2 N/A N/A Measurements L x W x D (cm) 27.458 N/A N/A A (cm) : rea 60.407 N/A N/A Volume (cm) : 72.70% N/A N/A % Reduction in A rea: 80.00% N/A N/A % Reduction in Volume: 4 Starting Position 1 (o'clock): 7 Ending Position 1 (o'clock): 0.3 Maximum Distance 1 (cm): Yes N/A N/A Undermining: Full Thickness Without Exposed N/A N/A Classification: Support Structures Medium N/A N/A Exudate Amount: Serosanguineous N/A N/A Exudate Type: red, brown N/A N/A  Exudate Color: Well defined, not attached N/A N/A Wound Margin: Large (67-100%) N/A N/A Granulation Amount: Red N/A N/A Granulation Quality: None Present (0%) N/A N/A Necrotic Amount: Fat Layer (Subcutaneous Tissue): Yes N/A N/A Exposed Structures: Fascia: No Tendon: No Muscle: No Joint: No Bone: No Small (1-33%) N/A N/A Epithelialization: Treatment Notes Wound #1 (Gluteus) Wound Laterality: Left Cleanser Soap and Water Discharge Instruction: May shower and wash wound with dial antibacterial soap and water prior to dressing change. Wound Cleanser Discharge Instruction: Cleanse the wound with wound cleanser prior to applying a clean dressing using gauze sponges, not tissue or cotton balls. Peri-Wound Care Skin Prep Discharge Instruction: Use skin prep as directed Topical Primary Dressing MediHoney Gel, tube 1.5 (oz) Discharge Instruction: Apply to wound bed as instructed Secondary Dressing Woven Gauze Sponges 2x2 in ABD Pad, 8x10 Discharge Instruction: Apply over primary dressing as directed. Secured With 46M Medipore H Soft Cloth Surgical T 4 x 2 (in/yd) ape Discharge Instruction: Secure dressing with tape as directed. Compression Wrap Compression Stockings Add-Ons Electronic Signature(s) Signed: 04/23/2021 4:21:38 PM By: Linton Ham MD Signed: 04/24/2021 5:21:18 PM By: Rhae Hammock RN Entered By:  Linton Ham on 04/23/2021 10:46:21 -------------------------------------------------------------------------------- Multi-Disciplinary Care Plan Details Patient Name: Date of Service: [name] [name], [name] [name] 04/23/2021 10:00 [place] Record Number: [id number] Patient Account Number: [id number] Date of Birth/Sex: Treating RN: 1971-06-13 (50 y.o. Sue Lush Primary Care Cordale Manera: Corena Pilgrim Other Clinician: Referring Anjulie Dipierro: Treating Henrick Mcgue/Extender: Glynn Octave in Treatment: 11 Active Inactive Wound/Skin Impairment Nursing Diagnoses: Knowledge deficit related to ulceration/compromised skin integrity Goals: Patient/caregiver will verbalize understanding of skin care regimen Date Initiated: 01/31/2021 Date Inactivated: 03/11/2021 Target Resolution Date: 02/28/2021 Goal Status: Met Ulcer/skin breakdown will have a volume reduction of 30% by week 4 Date Initiated: 03/11/2021 Date Inactivated: 03/11/2021 Target Resolution Date: 02/28/2021 Goal Status: Met Ulcer/skin breakdown will have a volume reduction of 50% by week 8 Date Initiated: 03/11/2021 Date Inactivated: 04/23/2021 Target Resolution Date: 04/25/2021 Goal Status: Met Ulcer/skin breakdown will have a volume reduction of 80% by week 12 Date Initiated: 04/23/2021 Target Resolution Date: 05/21/2021 Goal Status: Active Interventions: Assess patient/caregiver ability to obtain necessary supplies Assess patient/caregiver ability to perform ulcer/skin care regimen upon admission and as needed Provide education on ulcer and skin care Treatment Activities: Skin care regimen initiated : 01/31/2021 Topical wound management initiated : 01/31/2021 Notes: Electronic Signature(s) Signed: 04/23/2021 10:38:02 AM By: Lorrin Jackson Signed: 04/23/2021 10:38:02 AM By: Lorrin Jackson Entered By: Lorrin Jackson on 04/23/2021 10:38:02 -------------------------------------------------------------------------------- Pain  Assessment Details Patient Name: Date of Service: [name] [name], [name] [name] 04/23/2021 10:00 [place] Record Number: [id number] Patient Account Number: [id number] Date of Birth/Sex: Treating RN: 1970/07/29 (50 y.o. Sue Lush Primary Care Ariez Neilan: Corena Pilgrim Other Clinician: Referring Shatia Sindoni: Treating Orla Jolliff/Extender: Glynn Octave in Treatment: 11 Active Problems Location of Pain Severity and Description of Pain Patient Has Paino Yes Site Locations Pain Location: Pain in Ulcers With Dressing Change: Yes Duration of the Pain. Constant / Intermittento Intermittent Rate the pain. Current Pain Level: 8 Character of Pain Describe the Pain: Tender, Throbbing Pain Management and Medication Current Pain Management: Medication: Yes Cold Application: No Rest: Yes Massage: No Activity: No T.E.N.S.: No Heat Application: No Leg drop or elevation: No Is the Current Pain Management Adequate: Adequate How does your wound impact your activities of daily livingo Sleep: No Bathing: No Appetite: No Relationship With Others: No Bladder Continence: No Emotions: No Bowel Continence: No Work: No Toileting:  No Drive: No Dressing: No Hobbies: No Electronic Signature(s) Signed: 04/23/2021 4:47:08 PM By: Lorrin Jackson Entered By: Lorrin Jackson on 04/23/2021 10:26:19 -------------------------------------------------------------------------------- Patient/Caregiver Education Details Patient Name: Date of Service: [name] [name], [name] [name] 9/27/2022andnbsp10:00 [place] Record Number: [id number] Patient Account Number: [id number] Date of Birth/Gender: Treating RN: 23-Apr-1971 (50 y.o. Sue Lush Primary Care Physician: Corena Pilgrim Other Clinician: Referring Physician: Treating Physician/Extender: Glynn Octave in Treatment: 11 Education Assessment Education Provided To: Patient Education Topics Provided Pressure: Methods:  Explain/Verbal, Printed Responses: State content correctly Wound/Skin Impairment: Methods: Demonstration, Explain/Verbal, Printed Responses: State content correctly Electronic Signature(s) Signed: 04/23/2021 4:47:08 PM By: Lorrin Jackson Entered By: Lorrin Jackson on 04/23/2021 10:38:41 -------------------------------------------------------------------------------- Wound Assessment Details Patient Name: Date of Service: [name] [name], [name] [name] 04/23/2021 10:00 A M Medical Record Number: [id number] Patient Account Number: [id number] Date of Birth/Sex: Treating RN: 01-Jul-1971 (50 y.o. Sue Lush Primary Care Alexea Blase: Corena Pilgrim Other Clinician: Referring Samarion Ehle: Treating Rosebud Koenen/Extender: Glynn Octave in Treatment: 11 Wound Status Wound Number: 1 Primary Infection - not elsewhere classified Etiology: Wound Location: Left Gluteus Wound Status: Open Wounding Event: Other Lesion Comorbid Arrhythmia, Congestive Heart Failure, Type II Diabetes, Date Acquired: 12/23/2020 History: Neuropathy Weeks Of Treatment: 11 Clustered Wound: No Photos Wound Measurements Length: (cm) 9.2 Width: (cm) 3.8 Depth: (cm) 2.2 Area: (cm) 27.458 Volume: (cm) 60.407 % Reduction in Area: 72.7% % Reduction in Volume: 80% Epithelialization: Small (1-33%) Tunneling: No Undermining: Yes Starting Position (o'clock): 4 Ending Position (o'clock): 7 Maximum Distance: (cm) 0.3 Wound Description Classification: Full Thickness Without Exposed Support Structures Wound Margin: Well defined, not attached Exudate Amount: Medium Exudate Type: Serosanguineous Exudate Color: red, brown Foul Odor After Cleansing: No Slough/Fibrino No Wound Bed Granulation Amount: Large (67-100%) Exposed Structure Granulation Quality: Red Fascia Exposed: No Necrotic Amount: None Present (0%) Fat Layer (Subcutaneous Tissue) Exposed: Yes Tendon Exposed: No Muscle Exposed: No Joint Exposed:  No Bone Exposed: No Treatment Notes Wound #1 (Gluteus) Wound Laterality: Left Cleanser Soap and Water Discharge Instruction: May shower and wash wound with dial antibacterial soap and water prior to dressing change. Wound Cleanser Discharge Instruction: Cleanse the wound with wound cleanser prior to applying a clean dressing using gauze sponges, not tissue or cotton balls. Peri-Wound Care Skin Prep Discharge Instruction: Use skin prep as directed Topical Primary Dressing MediHoney Gel, tube 1.5 (oz) Discharge Instruction: Apply to wound bed as instructed Secondary Dressing Woven Gauze Sponges 2x2 in ABD Pad, 8x10 Discharge Instruction: Apply over primary dressing as directed. Secured With 15M Medipore H Soft Cloth Surgical T 4 x 2 (in/yd) ape Discharge Instruction: Secure dressing with tape as directed. Compression Wrap Compression Stockings Add-Ons Electronic Signature(s) Signed: 04/23/2021 4:47:08 PM By: Lorrin Jackson Entered By: Lorrin Jackson on 04/23/2021 10:41:56 -------------------------------------------------------------------------------- Vitals Details Patient Name: Date of Service: [name] [name], [name] [name] 04/23/2021 10:00 A M Medical Record Number: [id number] Patient Account Number: [id number] Date of Birth/Sex: Treating RN: Jul 19, 1971 (50 y.o. Sue Lush Primary Care Rube Sanchez: Corena Pilgrim Other Clinician: Referring Taegan Haider: Treating Mathis Cashman/Extender: Glynn Octave in Treatment: 11 Vital Signs Time Taken: 10:25 Temperature (F): 97.8 Pulse (bpm): 87 Respiratory Rate (breaths/min): 18 Blood Pressure (mmHg): 164/90 Capillary Blood Glucose (mg/dl): 114 Reference Range: 80 - 120 mg / dl Electronic Signature(s) Signed: 04/23/2021 4:47:08 PM By: Lorrin Jackson Entered By: Lorrin Jackson on 04/23/2021 10:25:37

## 2021-05-07 ENCOUNTER — Encounter (HOSPITAL_BASED_OUTPATIENT_CLINIC_OR_DEPARTMENT_OTHER): Payer: Medicaid Other | Attending: Internal Medicine | Admitting: Internal Medicine

## 2021-05-07 ENCOUNTER — Other Ambulatory Visit: Payer: Self-pay

## 2021-05-07 DIAGNOSIS — S31829D Unspecified open wound of left buttock, subsequent encounter: Secondary | ICD-10-CM

## 2021-05-07 DIAGNOSIS — L98492 Non-pressure chronic ulcer of skin of other sites with fat layer exposed: Secondary | ICD-10-CM | POA: Insufficient documentation

## 2021-05-07 DIAGNOSIS — T8131XD Disruption of external operation (surgical) wound, not elsewhere classified, subsequent encounter: Secondary | ICD-10-CM | POA: Diagnosis not present

## 2021-05-07 DIAGNOSIS — E11622 Type 2 diabetes mellitus with other skin ulcer: Secondary | ICD-10-CM | POA: Insufficient documentation

## 2021-05-07 DIAGNOSIS — S31829A Unspecified open wound of left buttock, initial encounter: Secondary | ICD-10-CM | POA: Diagnosis not present

## 2021-05-07 DIAGNOSIS — M726 Necrotizing fasciitis: Secondary | ICD-10-CM

## 2021-05-07 DIAGNOSIS — T8131XA Disruption of external operation (surgical) wound, not elsewhere classified, initial encounter: Secondary | ICD-10-CM | POA: Diagnosis not present

## 2021-05-07 DIAGNOSIS — Y838 Other surgical procedures as the cause of abnormal reaction of the patient, or of later complication, without mention of misadventure at the time of the procedure: Secondary | ICD-10-CM | POA: Diagnosis not present

## 2021-05-07 NOTE — Progress Notes (Signed)
[name] [name] ([id number]) Visit Report for 05/07/2021 Chief Complaint Document Details Patient Name: Date of Service: [name] [name], [name] [name] 05/07/2021 10:00 A M Medical Record Number: [id number] Patient Account Number: [id number] Date of Birth/Sex: Treating RN: 06-Oct-1970 (50 y.o. [name] [name], [name] [name] Primary Care Provider: Theophilus Kinds Other Clinician: Referring Provider: Treating Provider/Extender: Curt Bears in Treatment: 13 Information Obtained from: Patient Chief Complaint 01/31/2021; patient is here for review of a very large surgical wound on the left buttock Electronic Signature(s) Signed: 05/07/2021 11:12:03 AM By: Geralyn Corwin DO Entered By: Geralyn Corwin on 05/07/2021 11:06:14 -------------------------------------------------------------------------------- HPI Details Patient Name: Date of Service: [name] [name], [name] [name] 05/07/2021 10:00 A M Medical Record Number: [id number] Patient Account Number: [id number] Date of Birth/Sex: Treating RN: 1970-07-29 (50 y.o. [name] [name] Primary Care Provider: Theophilus Kinds Other Clinician: Referring Provider: Treating Provider/Extender: Curt Bears in Treatment: 13 History of Present Illness HPI Description: ADMISSION 12/31/2020 This is a 50 year old woman with poorly controlled type 2 diabetes on insulin. Her problem began after a fall in late April. She developed a pimple area on her left buttock that rapidly expanded. She was admitted to hospital from 12/28/2020 through 01/19/2021 initially I believed to [facility] subsequently transferred to [facility] when she developed clear septic shock. 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 Her wound culture grew grew group B strep. She required operative debridements on 6/3 and 6/6 and subsequent bedside debridements as well. I have another culture result that showed E. coli. She was discharged on cephalexin  and Augmentin she is long since finished this. She is now at home using Silvadene cream covered with ABDs. Past medical history includes new onset A. fib on Eliquis type 2 diabetes on insulin with a recent hemoglobin A1c of 11.1. As far as I can tell she is eating and drinking well she is taking Ensure at home. She has had significant weight loss dating back prior to this infection I am not exactly sure of the cause here 8/15; patient presents for follow-up. Its been 1 month since patient has last been seen. She is currently using Dakin's wet-to-dry dressings. She has no issues or complaints today. She denies signs of infection. 8/30; this is a patient have not seen in almost 2 months. She has a pressure ulcer on her left buttock and reasonably close proximity to her anal opening. Most of this appears fairly superficial however most proximally there is an area with more depth. She apparently has poorly controlled diabetes. She has been using Dakin's wet-to-dry for about 7 weeks now. She is changing this multiple times a day her son does the dressing. She says out of concern for urine but this does not look like an area that should be getting contaminated with urine. She claims not to be incontinent. She has a level 3 pressure relief surface I think by her description 9/13; we attempted to get this patient Medihoney to aid in surface debridement however she still has not gotten this to date and has been using wet-to-dry. 9/27; patient is using Medihoney gauze to the wound surface. They are apparently changing this 2-3 times a day due to difficulty keeping dressings in place. This is on the left buttock in close proximity to the gluteal cleft. The wound measures smaller and certainly has a much cleaner surface than I was expecting to see. 10/11; patient presents for follow-up. She has been using Medihoney daily to the wound surface. She has  no issues or complaints today. She tries to aggressively  offload the area. She denies signs of infection. Electronic Signature(s) Signed: 05/07/2021 11:12:03 AM By: Geralyn Corwin DO Entered By: Geralyn Corwin on 05/07/2021 11:06:40 -------------------------------------------------------------------------------- Physical Exam Details Patient Name: Date of Service: [name] [name], [name] [name] 05/07/2021 10:00 A M Medical Record Number: [id number] Patient Account Number: [id number] Date of Birth/Sex: Treating RN: [birth year] (50 y.o. [name] [name] Primary Care Provider: Theophilus Kinds Other Clinician: Referring Provider: Treating Provider/Extender: Curt Bears in Treatment: 13 Constitutional respirations regular, non-labored and within target range for patient.Marland Kitchen Psychiatric pleasant and cooperative. Notes Left buttocks wound with granulation tissue throughout. Undermining noted. No evidence of surrounding infection. Electronic Signature(s) Signed: 05/07/2021 11:12:03 AM By: Geralyn Corwin DO Entered By: Geralyn Corwin on 05/07/2021 11:07:29 -------------------------------------------------------------------------------- Physician Orders Details Patient Name: Date of Service: [name] [name], [name] [name] 05/07/2021 10:00 A M Medical Record Number: [id number] Patient Account Number: [id number] Date of Birth/Sex: Treating RN: Dec 24, 1970 (50 y.o. [name] [name] Primary Care Provider: Theophilus Kinds Other Clinician: Referring Provider: Treating Provider/Extender: Curt Bears in Treatment: [phone] Verbal / Phone Orders: No Diagnosis Coding ICD-10 Coding Code Description T81.31XD Disruption of external operation (surgical) wound, not elsewhere classified, subsequent encounter S31.829D Unspecified open wound of left buttock, subsequent encounter M72.6 Necrotizing fasciitis E11.622 Type 2 diabetes mellitus with other skin ulcer Follow-up Appointments ppointment in 2 weeks. - Dr. Leanord Hawking Return A Bathing/  Shower/ Hygiene May shower and wash wound with soap and water. Off-Loading Low air-loss mattress (Group 2) - continue to use. Turn and reposition every 2 hours - only up for meals and back to bed. Additional Orders / Instructions Follow Nutritious Diet - increase protein, eat three meals a day. Wound Treatment Wound #1 - Gluteus Wound Laterality: Left Cleanser: Soap and Water (Home Health) 1 x Per Day/30 Days Discharge Instructions: May shower and wash wound with dial antibacterial soap and water prior to dressing change. Cleanser: Wound Cleanser (Home Health) 1 x Per Day/30 Days Discharge Instructions: Cleanse the wound with wound cleanser prior to applying a clean dressing using gauze sponges, not tissue or cotton balls. Peri-Wound Care: Skin Prep (Home Health) 1 x Per Day/30 Days Discharge Instructions: Use skin prep as directed Prim Dressing: MediHoney Gel, tube 1.5 (oz) (Home Health) 1 x Per Day/30 Days ary Discharge Instructions: Apply to wound bed as instructed Secondary Dressing: Woven Gauze Sponges 2x2 in (Home Health) 1 x Per Day/30 Days Secondary Dressing: ABD Pad, 8x10 (Home Health) 1 x Per Day/30 Days Discharge Instructions: Apply over primary dressing as directed. Secured With: 56M Medipore H Soft Cloth Surgical T 4 x 2 (in/yd) (Home Health) 1 x Per Day/30 Days ape Discharge Instructions: Secure dressing with tape as directed. Electronic Signature(s) Signed: 05/07/2021 11:12:03 AM By: Geralyn Corwin DO Entered By: Geralyn Corwin on 05/07/2021 11:08:07 -------------------------------------------------------------------------------- Problem List Details Patient Name: Date of Service: [name] [name], [name] [name] 05/07/2021 10:00 A M Medical Record Number: [id number] Patient Account Number: [id number] Date of Birth/Sex: Treating RN: [birth year] (50 y.o. [name] [name] Primary Care Provider: Theophilus Kinds Other Clinician: Referring Provider: Treating Provider/Extender: Curt Bears in Treatment: 13 Active Problems ICD-10 Encounter Code Description Active Date MDM Diagnosis T81.31XD Disruption of external operation (surgical) wound, not elsewhere classified, 01/31/2021 No Yes subsequent encounter S31.829D Unspecified open wound of left buttock, subsequent encounter 01/31/2021 No Yes M72.6 Necrotizing fasciitis 01/31/2021 No Yes E11.622 Type 2 diabetes mellitus with other skin ulcer 01/31/2021 No Yes Inactive Problems Resolved  Problems Electronic Signature(s) Signed: 05/07/2021 11:12:03 AM By: Geralyn Corwin DO Signed: 05/07/2021 11:12:03 AM By: Geralyn Corwin DO Entered By: Geralyn Corwin on 05/07/2021 11:06:00 -------------------------------------------------------------------------------- Progress Note Details Patient Name: Date of Service: [name] [name], [name] [name] 05/07/2021 10:00 A M Medical Record Number: [id number] Patient Account Number: [id number] Date of Birth/Sex: Treating RN: Oct 18, 1970 (50 y.o. [name] [name], [name] [name] Primary Care Provider: Theophilus Kinds Other Clinician: Referring Provider: Treating Provider/Extender: Curt Bears in Treatment: 13 Subjective Chief Complaint Information obtained from Patient 01/31/2021; patient is here for review of a very large surgical wound on the left buttock History of Present Illness (HPI) ADMISSION 12/31/2020 This is a 50 year old woman with poorly controlled type 2 diabetes on insulin. Her problem began after a fall in late April. She developed a pimple area on her left buttock that rapidly expanded. She was admitted to hospital from 12/28/2020 through 01/19/2021 initially I believed to [facility] subsequently transferred to [facility] when she developed clear septic shock. 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 Her wound culture grew grew group B strep. She required operative debridements on 6/3 and 6/6 and subsequent bedside  debridements as well. I have another culture result that showed E. coli. She was discharged on cephalexin and Augmentin she is long since finished this. She is now at home using Silvadene cream covered with ABDs. Past medical history includes new onset A. fib on Eliquis type 2 diabetes on insulin with a recent hemoglobin A1c of 11.1. As far as I can tell she is eating and drinking well she is taking Ensure at home. She has had significant weight loss dating back prior to this infection I am not exactly sure of the cause here 8/15; patient presents for follow-up. Its been 1 month since patient has last been seen. She is currently using Dakin's wet-to-dry dressings. She has no issues or complaints today. She denies signs of infection. 8/30; this is a patient have not seen in almost 2 months. She has a pressure ulcer on her left buttock and reasonably close proximity to her anal opening. Most of this appears fairly superficial however most proximally there is an area with more depth. She apparently has poorly controlled diabetes. She has been using Dakin's wet-to-dry for about 7 weeks now. She is changing this multiple times a day her son does the dressing. She says out of concern for urine but this does not look like an area that should be getting contaminated with urine. She claims not to be incontinent. She has a level 3 pressure relief surface I think by her description 9/13; we attempted to get this patient Medihoney to aid in surface debridement however she still has not gotten this to date and has been using wet-to-dry. 9/27; patient is using Medihoney gauze to the wound surface. They are apparently changing this 2-3 times a day due to difficulty keeping dressings in place. This is on the left buttock in close proximity to the gluteal cleft. The wound measures smaller and certainly has a much cleaner surface than I was expecting to see. 10/11; patient presents for follow-up. She has been using  Medihoney daily to the wound surface. She has no issues or complaints today. She tries to aggressively offload the area. She denies signs of infection. Patient History Information obtained from Patient. Family History Cancer - Mother, Diabetes - Mother,Father, Hypertension - Mother,Father, Lung Disease - Mother, No family history of Heart Disease, Hereditary Spherocytosis, Kidney Disease, Seizures, Stroke,  Thyroid Problems, Tuberculosis. Social History Current every day smoker - 2-3 cigarettes a day, Marital Status - Widowed, Alcohol Use - Never, Drug Use - No History, Caffeine Use - Daily - coffee. Medical History Cardiovascular Patient has history of Arrhythmia, Congestive Heart Failure Endocrine Patient has history of Type II Diabetes Neurologic Patient has history of Neuropathy Medical A Surgical History Notes nd Genitourinary Acute Kidney Injury Objective Constitutional respirations regular, non-labored and within target range for patient.. Vitals Time Taken: 10:10 AM, Temperature: 97.7 F, Pulse: 103 bpm, Respiratory Rate: 20 breaths/min, Blood Pressure: 160/75 mmHg, Capillary Blood Glucose: 97 mg/dl. Psychiatric pleasant and cooperative. General Notes: Left buttocks wound with granulation tissue throughout. Undermining noted. No evidence of surrounding infection. Integumentary (Hair, Skin) Wound #1 status is Open. Original cause of wound was Other Lesion. The date acquired was: 12/23/2020. The wound has been in treatment 13 weeks. The wound is located on the Left Gluteus. The wound measures 9.3cm length x 4cm width x 2.1cm depth; 29.217cm^2 area and 61.355cm^3 volume. There is Fat Layer (Subcutaneous Tissue) exposed. There is no tunneling noted, however, there is undermining starting at 4:00 and ending at 7:00 with a maximum distance of 0.6cm. There is a medium amount of serosanguineous drainage noted. The wound margin is well defined and not attached to the wound base. There  is large (67-100%) red granulation within the wound bed. There is no necrotic tissue within the wound bed. Assessment Active Problems ICD-10 Disruption of external operation (surgical) wound, not elsewhere classified, subsequent encounter Unspecified open wound of left buttock, subsequent encounter Necrotizing fasciitis Type 2 diabetes mellitus with other skin ulcer Patient follows with Dr. Leanord Hawking. Patient's wound is stable. No signs of infection on exam. I recommended continuing Medihoney daily and aggressive offloading. Follow-up in 2 weeks. Plan Follow-up Appointments: Return Appointment in 2 weeks. - Dr. Leanord Hawking Bathing/ Shower/ Hygiene: May shower and wash wound with soap and water. Off-Loading: Low air-loss mattress (Group 2) - continue to use. Turn and reposition every 2 hours - only up for meals and back to bed. Additional Orders / Instructions: Follow Nutritious Diet - increase protein, eat three meals a day. WOUND #1: - Gluteus Wound Laterality: Left Cleanser: Soap and Water (Home Health) 1 x Per Day/30 Days Discharge Instructions: May shower and wash wound with dial antibacterial soap and water prior to dressing change. Cleanser: Wound Cleanser (Home Health) 1 x Per Day/30 Days Discharge Instructions: Cleanse the wound with wound cleanser prior to applying a clean dressing using gauze sponges, not tissue or cotton balls. Peri-Wound Care: Skin Prep (Home Health) 1 x Per Day/30 Days Discharge Instructions: Use skin prep as directed Prim Dressing: MediHoney Gel, tube 1.5 (oz) (Home Health) 1 x Per Day/30 Days ary Discharge Instructions: Apply to wound bed as instructed Secondary Dressing: Woven Gauze Sponges 2x2 in (Home Health) 1 x Per Day/30 Days Secondary Dressing: ABD Pad, 8x10 (Home Health) 1 x Per Day/30 Days Discharge Instructions: Apply over primary dressing as directed. Secured With: 44M Medipore H Soft Cloth Surgical T 4 x 2 (in/yd) (Home Health) 1 x Per Day/30  Days ape Discharge Instructions: Secure dressing with tape as directed. 1. Continue Medihoney daily 2. Aggressive offloading 3. Follow-up in 2 weeks Electronic Signature(s) Signed: 05/07/2021 11:12:03 AM By: Geralyn Corwin DO Entered By: Geralyn Corwin on 05/07/2021 11:10:45 -------------------------------------------------------------------------------- HxROS Details Patient Name: Date of Service: [name] [name], [name] [name] 05/07/2021 10:00 A M Medical Record Number: [id number] Patient Account Number: [id number] Date of Birth/Sex: Treating RN: 1971-07-09 (50  y.o. [name] [name] Primary Care Provider: Theophilus Kinds Other Clinician: Referring Provider: Treating Provider/Extender: Curt Bears in Treatment: 13 Information Obtained From Patient Cardiovascular Medical History: Positive for: Arrhythmia; Congestive Heart Failure Endocrine Medical History: Positive for: Type II Diabetes Time with diabetes: 16 years Treated with: Insulin, Oral agents Blood sugar tested every day: Yes Tested : AC and HS Genitourinary Medical History: Past Medical History Notes: Acute Kidney Injury Neurologic Medical History: Positive for: Neuropathy Immunizations Pneumococcal Vaccine: Received Pneumococcal Vaccination: No Implantable Devices None Family and Social History Cancer: Yes - Mother; Diabetes: Yes - Mother,Father; Heart Disease: No; Hereditary Spherocytosis: No; Hypertension: Yes - Mother,Father; Kidney Disease: No; Lung Disease: Yes - Mother; Seizures: No; Stroke: No; Thyroid Problems: No; Tuberculosis: No; Current every day smoker - 2-3 cigarettes a day; Marital Status - Widowed; Alcohol Use: Never; Drug Use: No History; Caffeine Use: Daily - coffee; Financial Concerns: No; Food, Clothing or Shelter Needs: No; Support System Lacking: No; Transportation Concerns: No Electronic Signature(s) Signed: 05/07/2021 11:12:03 AM By: Geralyn Corwin DO Signed:  05/07/2021 5:07:29 PM By: Shawn Stall RN, BSN Entered By: Geralyn Corwin on 05/07/2021 11:06:47 -------------------------------------------------------------------------------- SuperBill Details Patient Name: Date of Service: [name] [name], [name] [name] 05/07/2021 Medical Record Number: [id number] Patient Account Number: [id number] Date of Birth/Sex: Treating RN: 1970/08/06 (50 y.o. [name] [name], [name] [name] Primary Care Provider: Theophilus Kinds Other Clinician: Referring Provider: Treating Provider/Extender: Curt Bears in Treatment: 13 Diagnosis Coding ICD-10 Codes Code Description T81.31XD Disruption of external operation (surgical) wound, not elsewhere classified, subsequent encounter S31.829D Unspecified open wound of left buttock, subsequent encounter M72.6 Necrotizing fasciitis E11.622 Type 2 diabetes mellitus with other skin ulcer Facility Procedures CPT4 Code: [id number] Description: 99214 - WOUND CARE VISIT-LEV 4 EST PT Modifier: Quantity: 1 Physician Procedures : CPT4 Code Description Modifier [id number] - WC PHYS LEVEL 3 - EST PT ICD-10 Diagnosis Description T81.31XD Disruption of external operation (surgical) wound, not elsewhere classified, subsequent encounter S31.829D Unspecified open wound of left  buttock, subsequent encounter M72.6 Necrotizing fasciitis E11.622 Type 2 diabetes mellitus with other skin ulcer Quantity: 1 Electronic Signature(s) Signed: 05/07/2021 11:12:03 AM By: Geralyn Corwin DO Entered By: Geralyn Corwin on 05/07/2021 11:10:58

## 2021-05-08 NOTE — Progress Notes (Signed)
BERDENA, CISEK ([id number]) Visit Report for 05/07/2021 Arrival Information Details Patient Name: Date of Service: [name] [name], LAYFIELD 05/07/2021 10:00 A M Medical Record Number: [id number] Patient Account Number: [id number] Date of Birth/Sex: Treating RN: Nov 27, 1970 (50 y.o. Debara Pickett, Yvonne Kendall Primary Care Devina Bezold: Theophilus Kinds Other Clinician: Referring Jarad Barth: Treating Jhaniya Briski/Extender: Curt Bears in Treatment: 13 Visit Information History Since Last Visit Added or deleted any medications: No Patient Arrived: Wheel Chair Any new allergies or adverse reactions: No Arrival Time: 10:09 Had a fall or experienced change in No Accompanied By: son activities of daily living that may affect Transfer Assistance: Manual risk of falls: Patient Identification Verified: Yes Signs or symptoms of abuse/neglect since last visito No Secondary Verification Process Completed: Yes Hospitalized since last visit: No Patient Requires Transmission-Based Precautions: No Implantable device outside of the clinic excluding No Patient Has Alerts: Yes cellular tissue based products placed in the center Patient Alerts: Patient on Blood Thinner since last visit: Has Dressing in Place as Prescribed: Yes Pain Present Now: Yes Electronic Signature(s) Signed: 05/07/2021 5:07:29 PM By: Shawn Stall RN, BSN Entered By: Shawn Stall on 05/07/2021 10:09:59 -------------------------------------------------------------------------------- Clinic Level of Care Assessment Details Patient Name: Date of Service: [name] [name], [name] [name] 05/07/2021 10:00 A M Medical Record Number: [id number] Patient Account Number: [id number] Date of Birth/Sex: Treating RN: Dec 19, 1970 (50 y.o. Debara Pickett, Millard.Loa Primary Care Dannae Kato: Theophilus Kinds Other Clinician: Referring Emori Kamau: Treating Darris Carachure/Extender: Curt Bears in Treatment: 13 Clinic Level of Care Assessment Items TOOL 4  Quantity Score X- 1 0 Use when only an EandM is performed on FOLLOW-UP visit ASSESSMENTS - Nursing Assessment / Reassessment X- 1 10 Reassessment of Co-morbidities (includes updates in patient status) X- 1 5 Reassessment of Adherence to Treatment Plan ASSESSMENTS - Wound and Skin A ssessment / Reassessment []  - 0 Simple Wound Assessment / Reassessment - one wound X- 1 5 Complex Wound Assessment / Reassessment - multiple wounds X- 1 10 Dermatologic / Skin Assessment (not related to wound area) ASSESSMENTS - Focused Assessment []  - 0 Circumferential Edema Measurements - multi extremities X- 1 10 Nutritional Assessment / Counseling / Intervention []  - 0 Lower Extremity Assessment (monofilament, tuning fork, pulses) []  - 0 Peripheral Arterial Disease Assessment (using hand held doppler) ASSESSMENTS - Ostomy and/or Continence Assessment and Care []  - 0 Incontinence Assessment and Management []  - 0 Ostomy Care Assessment and Management (repouching, etc.) PROCESS - Coordination of Care []  - 0 Simple Patient / Family Education for ongoing care X- 1 20 Complex (extensive) Patient / Family Education for ongoing care X- 1 10 Staff obtains , Records, T Results / Process Orders est []  - 0 Staff telephones HHA, Nursing Homes / Clarify orders / etc []  - 0 Routine Transfer to another Facility (non-emergent condition) []  - 0 Routine Hospital Admission (non-emergent condition) []  - 0 New Admissions / / Ordering NPWT Apligraf, etc. , []  - 0 Emergency Hospital Admission (emergent condition) []  - 0 Simple Discharge Coordination X- 1 15 Complex (extensive) Discharge Coordination PROCESS - Special Needs []  - 0 Pediatric / Minor Patient Management []  - 0 Isolation Patient Management []  - 0 Hearing / Language / Visual special needs []  - 0 Assessment of Community assistance (transportation, D/C planning, etc.) []  - 0 Additional assistance / Altered  mentation []  - 0 Support Surface(s) Assessment (bed, cushion, seat, etc.) INTERVENTIONS - Wound Cleansing / Measurement []  - 0 Simple Wound Cleansing - one wound X- 1 5 Complex Wound Cleansing -  multiple wounds X- 1 5 Wound Imaging (photographs - any number of wounds) []  - 0 Wound Tracing (instead of photographs) []  - 0 Simple Wound Measurement - one wound X- 1 5 Complex Wound Measurement - multiple wounds INTERVENTIONS - Wound Dressings []  - 0 Small Wound Dressing one or multiple wounds X- 1 15 Medium Wound Dressing one or multiple wounds []  - 0 Large Wound Dressing one or multiple wounds []  - 0 Application of Medications - topical []  - 0 Application of Medications - injection INTERVENTIONS - Miscellaneous []  - 0 External ear exam []  - 0 Specimen Collection (cultures, biopsies, blood, body fluids, etc.) []  - 0 Specimen(s) / Culture(s) sent or taken to Lab for analysis []  - 0 Patient Transfer (multiple staff / / Similar devices) []  - 0 Simple Staple / Suture removal (25 or less) []  - 0 Complex Staple / Suture removal (26 or more) []  - 0 Hypo / Hyperglycemic Management (close monitor of Blood Glucose) []  - 0 Ankle / Brachial Index (ABI) - do not check if billed separately X- 1 5 Vital Signs Has the patient been seen at the hospital within the last three years: Yes Total Score: 120 Level Of Care: New/Established - Level 4 Electronic Signature(s) Signed: 05/07/2021 5:07:29 PM By: RN, BSN Entered By: on 05/07/2021 10:48:41 -------------------------------------------------------------------------------- Encounter Discharge Information Details Patient Name: Date of Service: [place] , [name] [name] 05/07/2021 10:00 A M Medical Record Number: Patient Account Number: Date of Birth/Sex: Treating RN: [birth year] (50 y.o. , Primary Care Maxi Rodas: Other Clinician: Referring  Kmya Placide: Treating Keita [place]/Extender: in Treatment: 13 Encounter Discharge Information Items Discharge Condition: Stable Ambulatory Status: Wheelchair Discharge Destination: Home Transportation: Private Auto Accompanied By: son Schedule Follow-up Appointment: Yes Clinical Summary of Care: Electronic Signature(s) Signed: 05/07/2021 5:07:29 PM By: Shawn Stall RN, BSN Entered By: 07/07/2021 on 05/07/2021 10:49:13 -------------------------------------------------------------------------------- Lower Extremity Assessment Details Patient Name: Date of Service: [name] [name], MOLLA 05/07/2021 10:00 A M Medical Record Number: [id number] Patient Account Number: 03/14/1971 Date of Birth/Sex: Treating RN: 1970-12-17 (50 y.o. Yvonne Kendall Primary Care Raye Slyter: Theophilus Kinds Other Clinician: Referring Sri Clegg: Treating Kateleen Encarnacion/Extender: Curt Bears in Treatment: 13 Electronic Signature(s) Signed: 05/07/2021 5:07:29 PM By: 07/07/2021 RN, BSN Entered By: Shawn Stall on 05/07/2021 10:10:20 -------------------------------------------------------------------------------- Multi Wound Chart Details Patient Name: Date of Service: [place] 07/07/2021, [name] [name] 05/07/2021 10:00 A M Medical Record Number: 07/07/2021 Patient Account Number: [id number] Date of Birth/Sex: Treating RN: 1971/04/20 (50 y.o. 54, [name] Primary Care Mubashir Mallek: Theophilus Kinds Other Clinician: Referring Javell Blackburn: Treating Jaryah Aracena/Extender: Curt Bears in Treatment: 13 Vital Signs Height(in): Capillary Blood Glucose(mg/dl): 97 Weight(lbs): Pulse(bpm): 103 Body Mass Index(BMI): Blood Pressure(mmHg): 160/75 Temperature(F): 97.7 Respiratory Rate(breaths/min): 20 Photos: [N/A:N/A] Left Gluteus N/A N/A Wound Location: Other Lesion N/A N/A Wounding Event: Infection - not elsewhere classified N/A N/A Primary Etiology: Arrhythmia,  Congestive Heart Failure, N/A N/A Comorbid History: Type II Diabetes, Neuropathy 12/23/2020 N/A N/A Date Acquired: 13 N/A N/A Weeks of Treatment: Open N/A N/A Wound Status: 9.3x4x2.1 N/A N/A Measurements L x W x D (cm) 29.217 N/A N/A A (cm) : rea 61.355 N/A N/A Volume (cm) : 70.90% N/A N/A % Reduction in A rea: 79.70% N/A N/A % Reduction in Volume: 4 Starting Position 1 (o'clock): 7 Ending Position 1 (o'clock): 0.6 Maximum Distance 1 (cm): Yes N/A N/A Undermining: Full Thickness Without Exposed N/A N/A Classification: Support Structures Medium  N/A N/A Exudate Amount: Serosanguineous N/A N/A Exudate Type: red, brown N/A N/A Exudate Color: Well defined, not attached N/A N/A Wound Margin: Large (67-100%) N/A N/A Granulation Amount: Red N/A N/A Granulation Quality: None Present (0%) N/A N/A Necrotic Amount: Fat Layer (Subcutaneous Tissue): Yes N/A N/A Exposed Structures: Fascia: No Tendon: No Muscle: No Joint: No Bone: No Small (1-33%) N/A N/A Epithelialization: Treatment Notes Wound #1 (Gluteus) Wound Laterality: Left Cleanser Soap and Water Discharge Instruction: May shower and wash wound with dial antibacterial soap and water prior to dressing change. Wound Cleanser Discharge Instruction: Cleanse the wound with wound cleanser prior to applying a clean dressing using gauze sponges, not tissue or cotton balls. Peri-Wound Care Skin Prep Discharge Instruction: Use skin prep as directed Topical Primary Dressing MediHoney Gel, tube 1.5 (oz) Discharge Instruction: Apply to wound bed as instructed Secondary Dressing Woven Gauze Sponges 2x2 in ABD Pad, 8x10 Discharge Instruction: Apply over primary dressing as directed. Secured With 1M Medipore H Soft Cloth Surgical T 4 x 2 (in/yd) ape Discharge Instruction: Secure dressing with tape as directed. Compression Wrap Compression Stockings Add-Ons Electronic Signature(s) Signed: 05/07/2021 11:12:03 AM  By: Geralyn Corwin DO Signed: 05/07/2021 5:07:29 PM By: Shawn Stall RN, BSN Entered By: Geralyn Corwin on 05/07/2021 11:06:06 -------------------------------------------------------------------------------- Multi-Disciplinary Care Plan Details Patient Name: Date of Service: [name] [name] Pour, Dynasia 05/07/2021 10:00 A M Medical Record Number: [id number] Patient Account Number: [id number] Date of Birth/Sex: Treating RN: 1970/10/01 (50 y.o. [name] Primary Care Dulcey Riederer: Theophilus Kinds Other Clinician: Referring Nioka Thorington: Treating Mairany Bruno/Extender: Curt Bears in Treatment: 13 Active Inactive Electronic Signature(s) Signed: 05/07/2021 5:07:29 PM By: Shawn Stall RN, BSN Entered By: Shawn Stall on 05/07/2021 10:17:54 -------------------------------------------------------------------------------- Pain Assessment Details Patient Name: Date of Service: TWILLA, KHOURI 05/07/2021 10:00 A M Medical Record Number: [id number] Patient Account Number: [id number] Date of Birth/Sex: Treating RN: 12-21-1970 (50 y.o. [name] Primary Care Akira Perusse: Theophilus Kinds Other Clinician: Referring Juluis Fitzsimmons: Treating Jennyfer Nickolson/Extender: Curt Bears in Treatment: 13 Active Problems Location of Pain Severity and Description of Pain Patient Has Paino Yes Site Locations Rate the pain. Rate the pain. Current Pain Level: 9 Worst Pain Level: 9 Least Pain Level: 0 Tolerable Pain Level: 8 Pain Management and Medication Current Pain Management: Medication: No Cold Application: No Rest: No Massage: No Activity: No T.E.N.S.: No Heat Application: No Leg drop or elevation: No Is the Current Pain Management Adequate: Adequate How does your wound impact your activities of daily livingo Sleep: No Bathing: No Appetite: No Relationship With Others: No Bladder Continence: No Emotions: No Bowel Continence: No Work: No Toileting:  No Drive: No Dressing: No Hobbies: No Psychologist, prison and probation services) Signed: 05/07/2021 5:07:29 PM By: Shawn Stall RN, BSN Entered By: Shawn Stall on 05/07/2021 10:10:13 -------------------------------------------------------------------------------- Patient/Caregiver Education Details Patient Name: Date of Service: [name], Tara 10/11/2022andnbsp10:00 A M Medical Record Number: [id number] Patient Account Number: [id number] Date of Birth/Gender: Treating RN: April 01, 1971 (50 y.o. [name] Primary Care Physician: Theophilus Kinds Other Clinician: Referring Physician: Treating Physician/Extender: Curt Bears in Treatment: 13 Education Assessment Education Provided To: Patient Education Topics Provided Wound/Skin Impairment: Handouts: Skin Care Do's and Dont's Methods: Explain/Verbal Responses: Reinforcements needed Electronic Signature(s) Signed: 05/07/2021 5:07:29 PM By: Shawn Stall RN, BSN Entered By: Shawn Stall on 05/07/2021 10:18:21 -------------------------------------------------------------------------------- Wound Assessment Details Patient Name: Date of Service: [name], [name] [name] 05/07/2021 10:00 A M Medical Record Number: [id number] Patient Account Number: [id number] Date of Birth/Sex: Treating RN: 1971-01-17 (50  y.o. Debara Pickett, Millard.Loa Primary Care Evalyse Stroope: Theophilus Kinds Other Clinician: Referring Trevin Gartrell: Treating Edmundo Tedesco/Extender: Curt Bears in Treatment: 13 Wound Status Wound Number: 1 Primary Infection - not elsewhere classified Etiology: Wound Location: Left Gluteus Wound Status: Open Wounding Event: Other Lesion Comorbid Arrhythmia, Congestive Heart Failure, Type II Diabetes, Date Acquired: 12/23/2020 History: Neuropathy Weeks Of Treatment: 13 Clustered Wound: No Photos Wound Measurements Length: (cm) 9.3 Width: (cm) 4 Depth: (cm) 2.1 Area: (cm) 29.217 Volume: (cm) 61.355 % Reduction  in Area: 70.9% % Reduction in Volume: 79.7% Epithelialization: Small (1-33%) Tunneling: No Undermining: Yes Starting Position (o'clock): 4 Ending Position (o'clock): 7 Maximum Distance: (cm) 0.6 Wound Description Classification: Full Thickness Without Exposed Support Structures Wound Margin: Well defined, not attached Exudate Amount: Medium Exudate Type: Serosanguineous Exudate Color: red, brown Foul Odor After Cleansing: No Slough/Fibrino No Wound Bed Granulation Amount: Large (67-100%) Exposed Structure Granulation Quality: Red Fascia Exposed: No Necrotic Amount: None Present (0%) Fat Layer (Subcutaneous Tissue) Exposed: Yes Tendon Exposed: No Muscle Exposed: No Joint Exposed: No Bone Exposed: No Treatment Notes Wound #1 (Gluteus) Wound Laterality: Left Cleanser Soap and Water Discharge Instruction: May shower and wash wound with dial antibacterial soap and water prior to dressing change. Wound Cleanser Discharge Instruction: Cleanse the wound with wound cleanser prior to applying a clean dressing using gauze sponges, not tissue or cotton balls. Peri-Wound Care Skin Prep Discharge Instruction: Use skin prep as directed Topical Primary Dressing MediHoney Gel, tube 1.5 (oz) Discharge Instruction: Apply to wound bed as instructed Secondary Dressing Woven Gauze Sponges 2x2 in ABD Pad, 8x10 Discharge Instruction: Apply over primary dressing as directed. Secured With 63M Medipore H Soft Cloth Surgical T 4 x 2 (in/yd) ape Discharge Instruction: Secure dressing with tape as directed. Compression Wrap Compression Stockings Add-Ons Electronic Signature(s) Signed: 05/07/2021 5:07:29 PM By: Shawn Stall RN, BSN Signed: 05/08/2021 1:46:44 PM By: Karl Ito Entered By: Karl Ito on 05/07/2021 10:16:26 -------------------------------------------------------------------------------- Vitals Details Patient Name: Date of Service: [name], [name] [name] 05/07/2021  10:00 A M Medical Record Number: [id number] Patient Account Number: [id number] Date of Birth/Sex: Treating RN: [birth year] (50 y.o. Debara Pickett, Millard.Loa Primary Care Enrrique Mierzwa: Theophilus Kinds Other Clinician: Referring Zakkiyya Barno: Treating Ashyla Luth/Extender: Curt Bears in Treatment: 13 Vital Signs Time Taken: 10:10 Temperature (F): 97.7 Pulse (bpm): 103 Respiratory Rate (breaths/min): 20 Blood Pressure (mmHg): 160/75 Capillary Blood Glucose (mg/dl): 97 Reference Range: 80 - 120 mg / dl Electronic Signature(s) Signed: 05/07/2021 5:07:29 PM By: Shawn Stall RN, BSN Entered By: Shawn Stall on 05/07/2021 10:11:39

## 2021-05-21 ENCOUNTER — Encounter (HOSPITAL_BASED_OUTPATIENT_CLINIC_OR_DEPARTMENT_OTHER): Payer: Medicaid Other | Admitting: Internal Medicine

## 2021-05-21 ENCOUNTER — Other Ambulatory Visit: Payer: Self-pay

## 2021-05-28 ENCOUNTER — Other Ambulatory Visit: Payer: Self-pay

## 2021-05-28 ENCOUNTER — Encounter (HOSPITAL_BASED_OUTPATIENT_CLINIC_OR_DEPARTMENT_OTHER): Payer: Medicaid Other | Attending: Internal Medicine | Admitting: Internal Medicine

## 2021-05-28 DIAGNOSIS — T8131XD Disruption of external operation (surgical) wound, not elsewhere classified, subsequent encounter: Secondary | ICD-10-CM | POA: Diagnosis not present

## 2021-05-28 DIAGNOSIS — E1140 Type 2 diabetes mellitus with diabetic neuropathy, unspecified: Secondary | ICD-10-CM | POA: Insufficient documentation

## 2021-05-28 DIAGNOSIS — E1151 Type 2 diabetes mellitus with diabetic peripheral angiopathy without gangrene: Secondary | ICD-10-CM | POA: Insufficient documentation

## 2021-05-28 DIAGNOSIS — M726 Necrotizing fasciitis: Secondary | ICD-10-CM | POA: Diagnosis not present

## 2021-05-28 DIAGNOSIS — E11622 Type 2 diabetes mellitus with other skin ulcer: Secondary | ICD-10-CM | POA: Insufficient documentation

## 2021-05-28 DIAGNOSIS — S31829A Unspecified open wound of left buttock, initial encounter: Secondary | ICD-10-CM | POA: Diagnosis not present

## 2021-05-28 NOTE — Progress Notes (Signed)
DAVISHA, LINTHICUM ([id number]) Visit Report for 05/28/2021 Arrival Information Details Patient Name: Date of Service: ALYXANDRA, TENBRINK 05/28/2021 9:00 [place] Record Number: [id number] Patient Account Number: [id number] Date of Birth/Sex: Treating RN: [birth year] (50 y.o. [name], [name] Primary Care Dorothea Yow: Corena Pilgrim Other Clinician: Referring Hadlea Furuya: Treating Chiante Peden/Extender: Glynn Octave in Treatment: 40 Visit Information History Since Last Visit Added or deleted any medications: No Patient Arrived: Wheel Chair Any new allergies or adverse reactions: No Arrival Time: 09:26 Had a fall or experienced change in No Accompanied By: son activities of daily living that may affect Transfer Assistance: Manual risk of falls: Patient Identification Verified: Yes Signs or symptoms of abuse/neglect since last visito No Secondary Verification Process Completed: Yes Hospitalized since last visit: No Patient Requires Transmission-Based Precautions: No Implantable device outside of the clinic excluding No Patient Has Alerts: Yes cellular tissue based products placed in the center Patient Alerts: Patient on Blood Thinner since last visit: Has Dressing in Place as Prescribed: Yes Pain Present Now: Yes Electronic Signature(s) Signed: 05/28/2021 10:49:54 AM By: Sandre Kitty Entered By: Sandre Kitty on 05/28/2021 09:26:42 -------------------------------------------------------------------------------- Clinic Level of Care Assessment Details Patient Name: Date of Service: SAYA, MCCOLL 05/28/2021 9:00 [place] Record Number: [id number] Patient Account Number: [id number] Date of Birth/Sex: Treating RN: 1970/08/19 (50 y.o. [name] Primary Care Dashawna Delbridge: Corena Pilgrim Other Clinician: Referring Kedron Uno: Treating Jamaury Gumz/Extender: Glynn Octave in Treatment: 16 Clinic Level of Care Assessment Items TOOL 4 Quantity  Score X- 1 0 Use when only an EandM is performed on FOLLOW-UP visit ASSESSMENTS - Nursing Assessment / Reassessment X- 1 10 Reassessment of Co-morbidities (includes updates in patient status) X- 1 5 Reassessment of Adherence to Treatment Plan ASSESSMENTS - Wound and Skin A ssessment / Reassessment X - Simple Wound Assessment / Reassessment - one wound 1 5 []  - 0 Complex Wound Assessment / Reassessment - multiple wounds []  - 0 Dermatologic / Skin Assessment (not related to wound area) ASSESSMENTS - Focused Assessment []  - 0 Circumferential Edema Measurements - multi extremities []  - 0 Nutritional Assessment / Counseling / Intervention []  - 0 Lower Extremity Assessment (monofilament, tuning fork, pulses) []  - 0 Peripheral Arterial Disease Assessment (using hand held doppler) ASSESSMENTS - Ostomy and/or Continence Assessment and Care []  - 0 Incontinence Assessment and Management []  - 0 Ostomy Care Assessment and Management (repouching, etc.) PROCESS - Coordination of Care X - Simple Patient / Family Education for ongoing care 1 15 []  - 0 Complex (extensive) Patient / Family Education for ongoing care X- 1 10 Staff obtains Consents, Records, T Results / Process Orders est []  - 0 Staff telephones HHA, Nursing Homes / Clarify orders / etc []  - 0 Routine Transfer to another Facility (non-emergent condition) []  - 0 Routine Hospital Admission (non-emergent condition) []  - 0 New Admissions / Biomedical engineer / Ordering NPWT Apligraf, etc. , []  - 0 Emergency Hospital Admission (emergent condition) X- 1 10 Simple Discharge Coordination []  - 0 Complex (extensive) Discharge Coordination PROCESS - Special Needs []  - 0 Pediatric / Minor Patient Management []  - 0 Isolation Patient Management []  - 0 Hearing / Language / Visual special needs []  - 0 Assessment of Community assistance (transportation, D/C planning, etc.) []  - 0 Additional assistance / Altered  mentation []  - 0 Support Surface(s) Assessment (bed, cushion, seat, etc.) INTERVENTIONS - Wound Cleansing / Measurement X - Simple Wound Cleansing - one wound 1 5 []  - 0 Complex Wound  Cleansing - multiple wounds X- 1 5 Wound Imaging (photographs - any number of wounds) []  - 0 Wound Tracing (instead of photographs) X- 1 5 Simple Wound Measurement - one wound []  - 0 Complex Wound Measurement - multiple wounds INTERVENTIONS - Wound Dressings []  - 0 Small Wound Dressing one or multiple wounds X- 1 15 Medium Wound Dressing one or multiple wounds []  - 0 Large Wound Dressing one or multiple wounds []  - 0 Application of Medications - topical []  - 0 Application of Medications - injection INTERVENTIONS - Miscellaneous []  - 0 External ear exam []  - 0 Specimen Collection (cultures, biopsies, blood, body fluids, etc.) []  - 0 Specimen(s) / Culture(s) sent or taken to Lab for analysis []  - 0 Patient Transfer (multiple staff / Civil Service fast streamer / Similar devices) []  - 0 Simple Staple / Suture removal (25 or less) []  - 0 Complex Staple / Suture removal (26 or more) []  - 0 Hypo / Hyperglycemic Management (close monitor of Blood Glucose) []  - 0 Ankle / Brachial Index (ABI) - do not check if billed separately X- 1 5 Vital Signs Has the patient been seen at the hospital within the last three years: Yes Total Score: 90 Level Of Care: New/Established - Level 3 Electronic Signature(s) Signed: 05/28/2021 5:36:20 PM By: Levan Hurst RN, BSN Entered By: Levan Hurst on 05/28/2021 16:23:04 -------------------------------------------------------------------------------- Encounter Discharge Information Details Patient Name: Date of Service: CA Windy Kalata, Devyn 05/28/2021 9:00 [place] [facility] Record Number: [id number] Patient Account Number: [id number] Date of Birth/Sex: Treating RN: 12/01/1970 (50 y.o. [name] Primary Care Gaddiel Cullens: Corena Pilgrim Other Clinician: Referring  Shaelin Lalley: Treating Janalee Grobe/Extender: Glynn Octave in Treatment: 16 Encounter Discharge Information Items Discharge Condition: Stable Ambulatory Status: Wheelchair Discharge Destination: Home Transportation: Private Auto Accompanied By: son Schedule Follow-up Appointment: Yes Clinical Summary of Care: Patient Declined Electronic Signature(s) Signed: 05/28/2021 5:36:20 PM By: Levan Hurst RN, BSN Entered By: Levan Hurst on 05/28/2021 16:24:08 -------------------------------------------------------------------------------- Multi Wound Chart Details Patient Name: Date of Service: CA Windy Kalata, Yunuen 05/28/2021 9:00 [place] Record Number: [id number] Patient Account Number: [id number] Date of Birth/Sex: Treating RN: Nov 08, 1970 (50 y.o. [name], [name] Primary Care Donielle Kaigler: Corena Pilgrim Other Clinician: Referring Megham Dwyer: Treating Benjamim Harnish/Extender: Glynn Octave in Treatment: 16 Vital Signs Height(in): Pulse(bpm): 86 Weight(lbs): Blood Pressure(mmHg): 132/80 Body Mass Index(BMI): Temperature(F): 97.7 Respiratory Rate(breaths/min): 16 Photos: [1:Left Gluteus] [N/A:N/A N/A] Wound Location: [1:Other Lesion] [N/A:N/A] Wounding Event: [1:Infection - not elsewhere classified N/A] Primary Etiology: [1:Arrhythmia, Congestive Heart Failure, N/A] Comorbid History: [1:Type II Diabetes, Neuropathy 12/23/2020] [N/A:N/A] Date Acquired: [1:16] [N/A:N/A] Weeks of Treatment: [1:Open] [N/A:N/A] Wound Status: [1:9x3x2.4] [N/A:N/A] Measurements L x W x D (cm) [1:21.206] [N/A:N/A] A (cm) : rea [1:50.894] [N/A:N/A] Volume (cm) : [1:78.90%] [N/A:N/A] % Reduction in A rea: [1:83.10%] [N/A:N/A] % Reduction in Volume: [1:5] Starting Position 1 (o'clock): [1:7] Ending Position 1 (o'clock): [1:0.3] Maximum Distance 1 (cm): [1:Yes] [N/A:N/A] Undermining: [1:Full Thickness Without Exposed] [N/A:N/A] Classification: [1:Support Structures  Medium] [N/A:N/A] Exudate Amount: [1:Serosanguineous] [N/A:N/A] Exudate Type: [1:red, brown] [N/A:N/A] Exudate Color: [1:Well defined, not attached] [N/A:N/A] Wound Margin: [1:Large (67-100%)] [N/A:N/A] Granulation Amount: [1:Red] [N/A:N/A] Granulation Quality: [1:None Present (0%)] [N/A:N/A] Necrotic Amount: [1:Fat Layer (Subcutaneous Tissue): Yes N/A] Exposed Structures: [1:Fascia: No Tendon: No Muscle: No Joint: No Bone: No Small (1-33%)] [N/A:N/A] Treatment Notes Electronic Signature(s) Signed: 05/28/2021 4:29:54 PM By: Linton Ham MD Signed: 05/28/2021 4:48:23 PM By: Rhae Hammock RN Entered By: Linton Ham on 05/28/2021 11:11:03 -------------------------------------------------------------------------------- Multi-Disciplinary Care Plan Details  Patient Name: Date of Service: DAVANNA, HE 05/28/2021 9:00 [place] Record Number: [id number] Patient Account Number: [id number] Date of Birth/Sex: Treating RN: [birth year] (50 y.o. [name] Primary Care Cindi Ghazarian: Corena Pilgrim Other Clinician: Referring Delesa Kawa: Treating Henley Boettner/Extender: Glynn Octave in Treatment: 16 Multidisciplinary Care Plan reviewed with physician Active Inactive Wound/Skin Impairment Nursing Diagnoses: Knowledge deficit related to ulceration/compromised skin integrity Goals: Patient/caregiver will verbalize understanding of skin care regimen Date Initiated: 01/31/2021 Target Resolution Date: 05/31/2021 Goal Status: Active Ulcer/skin breakdown will have a volume reduction of 50% by week 8 Date Initiated: 03/11/2021 Date Inactivated: 04/23/2021 Target Resolution Date: 04/25/2021 Goal Status: Met Ulcer/skin breakdown will have a volume reduction of 80% by week 12 Date Initiated: 04/23/2021 Date Inactivated: 05/07/2021 Target Resolution Date: 05/21/2021 Unmet Reason: see wound chart Goal Status: Unmet volume 70%. Interventions: Assess patient/caregiver  ability to obtain necessary supplies Assess patient/caregiver ability to perform ulcer/skin care regimen upon admission and as needed Provide education on ulcer and skin care Treatment Activities: Skin care regimen initiated : 01/31/2021 Topical wound management initiated : 01/31/2021 Notes: Electronic Signature(s) Signed: 05/28/2021 5:36:20 PM By: Levan Hurst RN, BSN Entered By: Levan Hurst on 05/28/2021 16:22:22 -------------------------------------------------------------------------------- Pain Assessment Details Patient Name: Date of Service: CA Windy Kalata, Margareta 05/28/2021 9:00 [place] Record Number: [id number] Patient Account Number: [id number] Date of Birth/Sex: Treating RN: 02/05/1971 (50 y.o. [name], [name] Primary Care Anetta Olvera: Corena Pilgrim Other Clinician: Referring Trenna Kiely: Treating Jessup Ogas/Extender: Glynn Octave in Treatment: 16 Active Problems Location of Pain Severity and Description of Pain Patient Has Paino Yes Site Locations Rate the pain. Current Pain Level: 8 Pain Management and Medication Current Pain Management: Electronic Signature(s) Signed: 05/28/2021 10:49:54 AM By: Sandre Kitty Signed: 05/28/2021 4:48:23 PM By: Rhae Hammock RN Entered By: Sandre Kitty on 05/28/2021 09:27:25 -------------------------------------------------------------------------------- Patient/Caregiver Education Details Patient Name: Date of Service: CA Windy Kalata, Jolette 11/1/2022andnbsp9:00 A M Medical Record Number: [id number] Patient Account Number: [id number] Date of Birth/Gender: Treating RN: [birth year] (50 y.o. [name] Primary Care Physician: Corena Pilgrim Other Clinician: Referring Physician: Treating Physician/Extender: Glynn Octave in Treatment: 16 Education Assessment Education Provided To: Patient Education Topics Provided Wound/Skin Impairment: Methods: Explain/Verbal Responses: State  content correctly [employer]) Signed: 05/28/2021 5:36:20 PM By: Levan Hurst RN, BSN Entered By: Levan Hurst on 05/28/2021 16:22:38 -------------------------------------------------------------------------------- Wound Assessment Details Patient Name: Date of Service: CA MARYSOL, WELLNITZ 05/28/2021 9:00 A M Medical Record Number: [id number] Patient Account Number: [id number] Date of Birth/Sex: Treating RN: 02-24-1971 (50 y.o. [name], [name] Primary Care Conway Fedora: Corena Pilgrim Other Clinician: Referring Joey Lierman: Treating Demi Trieu/Extender: Glynn Octave in Treatment: 16 Wound Status Wound Number: 1 Primary Infection - not elsewhere classified Etiology: Wound Location: Left Gluteus Wound Status: Open Wounding Event: Other Lesion Comorbid Arrhythmia, Congestive Heart Failure, Type II Diabetes, Date Acquired: 12/23/2020 History: Neuropathy Weeks Of Treatment: 16 Clustered Wound: No Photos Wound Measurements Length: (cm) 9 Width: (cm) 3 Depth: (cm) 2.4 Area: (cm) 21.206 Volume: (cm) 50.894 % Reduction in Area: 78.9% % Reduction in Volume: 83.1% Epithelialization: Small (1-33%) Tunneling: No Undermining: Yes Starting Position (o'clock): 5 Ending Position (o'clock): 7 Maximum Distance: (cm) 0.3 Wound Description Classification: Full Thickness Without Exposed Support Structures Wound Margin: Well defined, not attached Exudate Amount: Medium Exudate Type: Serosanguineous Exudate Color: red, brown Foul Odor After Cleansing: No Slough/Fibrino No Wound Bed Granulation Amount: Large (67-100%) Exposed Structure Granulation Quality: Red Fascia Exposed: No Necrotic Amount: None Present (0%) Fat  Layer (Subcutaneous Tissue) Exposed: Yes Tendon Exposed: No Muscle Exposed: No Joint Exposed: No Bone Exposed: No Treatment Notes Wound #1 (Gluteus) Wound Laterality: Left Cleanser Soap and Water Discharge Instruction: May shower and  wash wound with dial antibacterial soap and water prior to dressing change. Wound Cleanser Discharge Instruction: Cleanse the wound with wound cleanser prior to applying a clean dressing using gauze sponges, not tissue or cotton balls. Peri-Wound Care Skin Prep Discharge Instruction: Use skin prep as directed Topical Primary Dressing KerraCel Ag Gelling Fiber Dressing, 4x5 in (silver alginate) Discharge Instruction: Apply silver alginate to wound bed as instructed Secondary Dressing Woven Gauze Sponge, Non-Sterile 4x4 in Discharge Instruction: Apply over primary dressing as directed. ABD Pad, 8x10 Discharge Instruction: Apply over primary dressing as directed. Secured With 28M Medipore H Soft Cloth Surgical T 4 x 2 (in/yd) ape Discharge Instruction: Secure dressing with tape as directed. Compression Wrap Compression Stockings Add-Ons Electronic Signature(s) Signed: 05/28/2021 4:48:23 PM By: Rhae Hammock RN Signed: 05/28/2021 5:36:20 PM By: Levan Hurst RN, BSN Entered By: Levan Hurst on 05/28/2021 09:42:51 -------------------------------------------------------------------------------- Vitals Details Patient Name: Date of Service: CA SSIDY, Timiyah 05/28/2021 9:00 [place] Record Number: [id number] Patient Account Number: [id number] Date of Birth/Sex: Treating RN: April 12, 1971 (50 y.o. [name], [name] Primary Care Kortlynn Poust: Corena Pilgrim Other Clinician: Referring Baylei Siebels: Treating Nyela Cortinas/Extender: Glynn Octave in Treatment: 16 Vital Signs Time Taken: 09:26 Temperature (F): 97.7 Pulse (bpm): 86 Respiratory Rate (breaths/min): 16 Blood Pressure (mmHg): 132/80 Reference Range: 80 - 120 mg / dl Electronic Signature(s) Signed: 05/28/2021 10:49:54 AM By: Sandre Kitty Entered By: Sandre Kitty on 05/28/2021 09:27:07

## 2021-05-28 NOTE — Progress Notes (Signed)
[name] [name], [name] [name] ([id number]) Visit Report for 05/28/2021 HPI Details Patient Name: Date of Service: [name] [name], [name] [name] 05/28/2021 9:00 [place] Record Number: [id number] Patient Account Number: [id number] Date of Birth/Sex: Treating RN: Mar 29, 1971 (50 y.o. [name] [name], [name] [name] Primary Care Provider: Corena Pilgrim Other Clinician: Referring Provider: Treating Provider/Extender: Glynn Octave in Treatment: 16 History of Present Illness HPI Description: ADMISSION 12/31/2020 This is a 50 year old woman with poorly controlled type 2 diabetes on insulin. Her problem began after a fall in late April. She developed a pimple area on her left buttock that rapidly expanded. She was admitted to hospital from 12/28/2020 through 01/19/2021 initially I believed to [facility] subsequently transferred to [facility] when she developed clear septic shock. 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 Her wound culture grew grew group B strep. She required operative debridements on 6/3 and 6/6 and subsequent bedside debridements as well. I have another culture result that showed E. coli. She was discharged on cephalexin and Augmentin she is long since finished this. She is now at home using Silvadene cream covered with ABDs. Past medical history includes new onset A. fib on Eliquis type 2 diabetes on insulin with a recent hemoglobin A1c of 11.1. As far as I can tell she is eating and drinking well she is taking Ensure at home. She has had significant weight loss dating back prior to this infection I am not exactly sure of the cause here 8/15; patient presents for follow-up. Its been 1 month since patient has last been seen. She is currently using Dakin's wet-to-dry dressings. She has no issues or complaints today. She denies signs of infection. 8/30; this is a patient have not seen in almost 2 months. She has a pressure ulcer on her left buttock and reasonably  close proximity to her anal opening. Most of this appears fairly superficial however most proximally there is an area with more depth. She apparently has poorly controlled diabetes. She has been using Dakin's wet-to-dry for about 7 weeks now. She is changing this multiple times a day her son does the dressing. She says out of concern for urine but this does not look like an area that should be getting contaminated with urine. She claims not to be incontinent. She has a level 3 pressure relief surface I think by her description 9/13; we attempted to get this patient Medihoney to aid in surface debridement however she still has not gotten this to date and has been using wet-to-dry. 9/27; patient is using Medihoney gauze to the wound surface. They are apparently changing this 2-3 times a day due to difficulty keeping dressings in place. This is on the left buttock in close proximity to the gluteal cleft. The wound measures smaller and certainly has a much cleaner surface than I was expecting to see. 10/11; patient presents for follow-up. She has been using Medihoney daily to the wound surface. She has no issues or complaints today. She tries to aggressively offload the area. She denies signs of infection. 10/25; wound surface looks healthier today. I am going to try to use silver alginate. Medihoney is cleaned up the surface of the wound 11/1; this continues to contract at his steady albeit slow rate. Healthy looking granulation we have been using silver alginate we have given her pieces of this because of her status has Medicaid changing every 2 days. There is no evidence of concurrent infection. Initially this was because of a necrotizing soft tissue  infection Electronic Signature(s) Signed: 05/28/2021 4:29:54 PM By: Linton Ham MD Entered By: Linton Ham on 05/28/2021 11:12:05 -------------------------------------------------------------------------------- Physical Exam Details Patient  Name: Date of Service: [name] [name], [name] [name] 05/28/2021 9:00 [place] Record Number: [id number] Patient Account Number: [id number] Date of Birth/Sex: Treating RN: 09/11/1970 (50 y.o. [name] [name], [name] [name] Primary Care Provider: Corena Pilgrim Other Clinician: Referring Provider: Treating Provider/Extender: Glynn Octave in Treatment: 16 Constitutional Sitting or standing Blood Pressure is within target range for patient.. Pulse regular and within target range for patient.Marland Kitchen Respirations regular, non-labored and within target range.. Temperature is normal and within the target range for the patient.Marland Kitchen Appears in no distress. Notes Wound exam; left gluteus near the gluteal cleft. Healthy looking granulation. There is no evidence of surrounding infection definitely measuring smaller in terms of surface area. She has the area at the edge of the wound with some depth but even this appears to be very gradually contracting. Electronic Signature(s) Signed: 05/28/2021 4:29:54 PM By: Linton Ham MD Entered By: Linton Ham on 05/28/2021 11:13:35 -------------------------------------------------------------------------------- Physician Orders Details Patient Name: Date of Service: [name] [name], [name] [name] 05/28/2021 9:00 [place] Number: [id number] Patient Account Number: [id number] Date of Birth/Sex: Treating RN: [birth year] (50 y.o. [name] Primary Care Provider: Corena Pilgrim Other Clinician: Referring Provider: Treating Provider/Extender: Glynn Octave in Treatment: 16 Verbal / Phone Orders: No Diagnosis Coding ICD-10 Coding Code Description T81.31XD Disruption of external operation (surgical) wound, not elsewhere classified, subsequent encounter S31.829D Unspecified open wound of left buttock, subsequent encounter M72.6 Necrotizing fasciitis E11.622 Type 2 diabetes mellitus with other skin ulcer Follow-up Appointments ppointment in 1  week. - Dr. Dellia Nims Return A Bathing/ Shower/ Hygiene May shower and wash wound with soap and water. Off-Loading Low air-loss mattress (Group 2) - continue to use. Turn and reposition every 2 hours - only up for meals and back to bed. Additional Orders / Instructions Follow Nutritious Diet - increase protein, eat three meals a day. Wound Treatment Wound #1 - Gluteus Wound Laterality: Left Cleanser: Soap and Water 1 x Per Day/30 Days Discharge Instructions: May shower and wash wound with dial antibacterial soap and water prior to dressing change. Cleanser: Wound Cleanser 1 x Per Day/30 Days Discharge Instructions: Cleanse the wound with wound cleanser prior to applying a clean dressing using gauze sponges, not tissue or cotton balls. Peri-Wound Care: Skin Prep 1 x Per Day/30 Days Discharge Instructions: Use skin prep as directed Prim Dressing: KerraCel Ag Gelling Fiber Dressing, 4x5 in (silver alginate) 1 x Per Day/30 Days ary Discharge Instructions: Apply silver alginate to wound bed as instructed Secondary Dressing: Woven Gauze Sponge, Non-Sterile 4x4 in 1 x Per Day/30 Days Discharge Instructions: Apply over primary dressing as directed. Secondary Dressing: ABD Pad, 8x10 1 x Per Day/30 Days Discharge Instructions: Apply over primary dressing as directed. Secured With: 35M Medipore H Soft Cloth Surgical T 4 x 2 (in/yd) 1 x Per Day/30 Days ape Discharge Instructions: Secure dressing with tape as directed. Electronic Signature(s) Signed: 05/28/2021 4:29:54 PM By: Linton Ham MD Signed: 05/28/2021 5:36:20 PM By: Levan Hurst RN, BSN Entered By: Levan Hurst on 05/28/2021 10:25:09 -------------------------------------------------------------------------------- Problem List Details Patient Name: Date of Service: [name] [name], [name] [name] 05/28/2021 9:00 [place] Record Number: [id number] Patient Account Number: [id number] Date of Birth/Sex: Treating RN: 31-Oct-1970 (50 y.o. [name] [name],  [name] [name] Primary Care Provider: Corena Pilgrim Other Clinician: Referring Provider: Treating Provider/Extender: Glynn Octave in Treatment: 16 Active Problems  ICD-10 Encounter Code Description Active Date MDM Diagnosis T81.31XD Disruption of external operation (surgical) wound, not elsewhere classified, 01/31/2021 No Yes subsequent encounter S31.829D Unspecified open wound of left buttock, subsequent encounter 01/31/2021 No Yes M72.6 Necrotizing fasciitis 01/31/2021 No Yes E11.622 Type 2 diabetes mellitus with other skin ulcer 01/31/2021 No Yes Inactive Problems Resolved Problems Electronic Signature(s) Signed: 05/28/2021 4:29:54 PM By: Linton Ham MD Entered By: Linton Ham on 05/28/2021 11:10:44 -------------------------------------------------------------------------------- Progress Note Details Patient Name: Date of Service: [name] [name], [name] [name] 05/28/2021 9:00 [place] Record Number: [id number] Patient Account Number: [id number] Date of Birth/Sex: Treating RN: 01/30/1971 (50 y.o. [name] [name], [name] [name] Primary Care Provider: Corena Pilgrim Other Clinician: Referring Provider: Treating Provider/Extender: Glynn Octave in Treatment: 16 Subjective History of Present Illness (HPI) ADMISSION 12/31/2020 This is a 50 year old woman with poorly controlled type 2 diabetes on insulin. Her problem began after a fall in late April. She developed a pimple area on her left buttock that rapidly expanded. She was admitted to hospital from 12/28/2020 through 01/19/2021 initially I believed to [facility] subsequently transferred to [facility] when she developed clear septic shock. 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 Her wound culture grew grew group B strep. She required operative debridements on 6/3 and 6/6 and subsequent bedside debridements as well. I have another culture result that showed E. coli. She was  discharged on cephalexin and Augmentin she is long since finished this. She is now at home using Silvadene cream covered with ABDs. Past medical history includes new onset A. fib on Eliquis type 2 diabetes on insulin with a recent hemoglobin A1c of 11.1. As far as I can tell she is eating and drinking well she is taking Ensure at home. She has had significant weight loss dating back prior to this infection I am not exactly sure of the cause here 8/15; patient presents for follow-up. Its been 1 month since patient has last been seen. She is currently using Dakin's wet-to-dry dressings. She has no issues or complaints today. She denies signs of infection. 8/30; this is a patient have not seen in almost 2 months. She has a pressure ulcer on her left buttock and reasonably close proximity to her anal opening. Most of this appears fairly superficial however most proximally there is an area with more depth. She apparently has poorly controlled diabetes. She has been using Dakin's wet-to-dry for about 7 weeks now. She is changing this multiple times a day her son does the dressing. She says out of concern for urine but this does not look like an area that should be getting contaminated with urine. She claims not to be incontinent. She has a level 3 pressure relief surface I think by her description 9/13; we attempted to get this patient Medihoney to aid in surface debridement however she still has not gotten this to date and has been using wet-to-dry. 9/27; patient is using Medihoney gauze to the wound surface. They are apparently changing this 2-3 times a day due to difficulty keeping dressings in place. This is on the left buttock in close proximity to the gluteal cleft. The wound measures smaller and certainly has a much cleaner surface than I was expecting to see. 10/11; patient presents for follow-up. She has been using Medihoney daily to the wound surface. She has no issues or complaints today. She  tries to aggressively offload the area. She denies signs of infection. 10/25; wound surface looks healthier today. I  am going to try to use silver alginate. Medihoney is cleaned up the surface of the wound 11/1; this continues to contract at his steady albeit slow rate. Healthy looking granulation we have been using silver alginate we have given her pieces of this because of her status has Medicaid changing every 2 days. There is no evidence of concurrent infection. Initially this was because of a necrotizing soft tissue infection Objective Constitutional Sitting or standing Blood Pressure is within target range for patient.. Pulse regular and within target range for patient.Marland Kitchen Respirations regular, non-labored and within target range.. Temperature is normal and within the target range for the patient.Marland Kitchen Appears in no distress. Vitals Time Taken: 9:26 AM, Temperature: 97.7 F, Pulse: 86 bpm, Respiratory Rate: 16 breaths/min, Blood Pressure: 132/80 mmHg. General Notes: Wound exam; left gluteus near the gluteal cleft. Healthy looking granulation. There is no evidence of surrounding infection definitely measuring smaller in terms of surface area. She has the area at the edge of the wound with some depth but even this appears to be very gradually contracting. Integumentary (Hair, Skin) Wound #1 status is Open. Original cause of wound was Other Lesion. The date acquired was: 12/23/2020. The wound has been in treatment 16 weeks. The wound is located on the Left Gluteus. The wound measures 9cm length x 3cm width x 2.4cm depth; 21.206cm^2 area and 50.894cm^3 volume. There is Fat Layer (Subcutaneous Tissue) exposed. There is no tunneling noted, however, there is undermining starting at 5:00 and ending at 7:00 with a maximum distance of 0.3cm. There is a medium amount of serosanguineous drainage noted. The wound margin is well defined and not attached to the wound base. There is large (67-100%) red granulation  within the wound bed. There is no necrotic tissue within the wound bed. Assessment Active Problems ICD-10 Disruption of external operation (surgical) wound, not elsewhere classified, subsequent encounter Unspecified open wound of left buttock, subsequent encounter Necrotizing fasciitis Type 2 diabetes mellitus with other skin ulcer Plan Follow-up Appointments: Return Appointment in 1 week. - Dr. Leanord Hawking Bathing/ Shower/ Hygiene: May shower and wash wound with soap and water. Off-Loading: Low air-loss mattress (Group 2) - continue to use. Turn and reposition every 2 hours - only up for meals and back to bed. Additional Orders / Instructions: Follow Nutritious Diet - increase protein, eat three meals a day. WOUND #1: - Gluteus Wound Laterality: Left Cleanser: Soap and Water 1 x Per Day/30 Days Discharge Instructions: May shower and wash wound with dial antibacterial soap and water prior to dressing change. Cleanser: Wound Cleanser 1 x Per Day/30 Days Discharge Instructions: Cleanse the wound with wound cleanser prior to applying a clean dressing using gauze sponges, not tissue or cotton balls. Peri-Wound Care: Skin Prep 1 x Per Day/30 Days Discharge Instructions: Use skin prep as directed Prim Dressing: KerraCel Ag Gelling Fiber Dressing, 4x5 in (silver alginate) 1 x Per Day/30 Days ary Discharge Instructions: Apply silver alginate to wound bed as instructed Secondary Dressing: Woven Gauze Sponge, Non-Sterile 4x4 in 1 x Per Day/30 Days Discharge Instructions: Apply over primary dressing as directed. Secondary Dressing: ABD Pad, 8x10 1 x Per Day/30 Days Discharge Instructions: Apply over primary dressing as directed. Secured With: 68M Medipore H Soft Cloth Surgical T 4 x 2 (in/yd) 1 x Per Day/30 Days ape Discharge Instructions: Secure dressing with tape as directed. #1 we will continue with silver alginate. Although I had some thoughts about alternative primary dressings like Hydrofera  Blue, she has absolutely no insurance and  really silver alginate is the most easily obtainable dressing part of which we can give her. 2. She seems to be doing a good job of offloading this. 3. No evidence of infection Electronic Signature(s) Signed: 05/28/2021 4:29:54 PM By: Linton Ham MD Entered By: Linton Ham on 05/28/2021 11:15:32 -------------------------------------------------------------------------------- SuperBill Details Patient Name: Date of Service: [name] [name], [name] [name] 05/28/2021 Medical Record Number: [id number] Patient Account Number: [id number] Date of Birth/Sex: Treating RN: 29-Apr-1971 (50 y.o. [name] [name], [name] [name] Primary Care Provider: Corena Pilgrim Other Clinician: Referring Provider: Treating Provider/Extender: Glynn Octave in Treatment: 16 Diagnosis Coding ICD-10 Codes Code Description T81.31XD Disruption of external operation (surgical) wound, not elsewhere classified, subsequent encounter S31.829D Unspecified open wound of left buttock, subsequent encounter M72.6 Necrotizing fasciitis E11.622 Type 2 diabetes mellitus with other skin ulcer Facility Procedures CPT4 Code: [id number] Description: 99213 - WOUND CARE VISIT-LEV 3 EST PT Modifier: Quantity: 1 Physician Procedures : CPT4 Code Description Modifier [id number] - WC PHYS LEVEL 3 - EST PT ICD-10 Diagnosis Description T81.31XD Disruption of external operation (surgical) wound, not elsewhere classified, subsequent encounter S31.829D Unspecified open wound of left  buttock, subsequent encounter Quantity: 1 Electronic Signature(s) Signed: 05/28/2021 4:29:54 PM By: Linton Ham MD Signed: 05/28/2021 5:36:20 PM By: Levan Hurst RN, BSN Entered By: Levan Hurst on 05/28/2021 16:23:12

## 2021-06-04 ENCOUNTER — Encounter (HOSPITAL_BASED_OUTPATIENT_CLINIC_OR_DEPARTMENT_OTHER): Payer: Medicaid Other | Admitting: Internal Medicine

## 2021-06-12 ENCOUNTER — Other Ambulatory Visit: Payer: Self-pay

## 2021-06-12 ENCOUNTER — Encounter (HOSPITAL_BASED_OUTPATIENT_CLINIC_OR_DEPARTMENT_OTHER): Payer: Medicaid Other | Admitting: Internal Medicine

## 2021-06-12 DIAGNOSIS — E11622 Type 2 diabetes mellitus with other skin ulcer: Secondary | ICD-10-CM | POA: Diagnosis not present

## 2021-06-13 NOTE — Progress Notes (Signed)
[name] [name], [name] [name] ([id number]) Visit Report for 06/12/2021 Arrival Information Details Patient Name: Date of Service: [name] [name], [name] [name] 06/12/2021 10:30 A M Medical Record Number: [id number] Patient Account Number: [id number] Date of Birth/Sex: Treating RN: 06/16/1971 (50 y.o. [name] Primary Care Trevar Boehringer: Corena Pilgrim Other Clinician: Referring Lavonia Eager: Treating Zhyon Antenucci/Extender: Glynn Octave in Treatment: 48 Visit Information History Since Last Visit Added or deleted any medications: No Patient Arrived: Wheel Chair Any new allergies or adverse reactions: No Arrival Time: 10:08 Had a fall or experienced change in No Accompanied By: son activities of daily living that may affect Transfer Assistance: Manual risk of falls: Patient Identification Verified: Yes Signs or symptoms of abuse/neglect since last visito No Secondary Verification Process Completed: Yes Hospitalized since last visit: No Patient Requires Transmission-Based Precautions: No Implantable device outside of the clinic excluding No Patient Has Alerts: Yes cellular tissue based products placed in the center Patient Alerts: Patient on Blood Thinner since last visit: Has Dressing in Place as Prescribed: Yes Pain Present Now: Yes Electronic Signature(s) Signed: 06/12/2021 2:27:45 PM By: Sandre Kitty Entered By: Sandre Kitty on 06/12/2021 10:09:18 -------------------------------------------------------------------------------- Clinic Level of Care Assessment Details Patient Name: Date of Service: [name] [name], [name] [name] 06/12/2021 10:30 A M Medical Record Number: [id number] Patient Account Number: [id number] Date of Birth/Sex: Treating RN: November 16, 1970 (50 y.o. [name], [name] Primary Care Kazim Corrales: Corena Pilgrim Other Clinician: Referring Emilea Goga: Treating Gaje Tennyson/Extender: Glynn Octave in Treatment: 18 Clinic Level of Care Assessment Items TOOL 4  Quantity Score X- 1 0 Use when only an EandM is performed on FOLLOW-UP visit ASSESSMENTS - Nursing Assessment / Reassessment X- 1 10 Reassessment of Co-morbidities (includes updates in patient status) X- 1 5 Reassessment of Adherence to Treatment Plan ASSESSMENTS - Wound and Skin A ssessment / Reassessment X - Simple Wound Assessment / Reassessment - one wound 1 5 _0  - 0 Complex Wound Assessment / Reassessment - multiple wounds _1  - 0 Dermatologic / Skin Assessment (not related to wound area) ASSESSMENTS - Focused Assessment _2  - 0 Circumferential Edema Measurements - multi extremities _3  - 0 Nutritional Assessment / Counseling / Intervention _4  - 0 Lower Extremity Assessment (monofilament, tuning fork, pulses) _5  - 0 Peripheral Arterial Disease Assessment (using hand held doppler) ASSESSMENTS - Ostomy and/or Continence Assessment and Care _6  - 0 Incontinence Assessment and Management _7  - 0 Ostomy Care Assessment and Management (repouching, etc.) PROCESS - Coordination of Care X - Simple Patient / Family Education for ongoing care 1 15 _8  - 0 Complex (extensive) Patient / Family Education for ongoing care X- 1 10 Staff obtains Programmer, systems, Records, T Results / Process Orders est _9  - 0 Staff telephones HHA, Nursing Homes / Clarify orders / etc _10  - 0 Routine Transfer to another Facility (non-emergent condition) _11  - 0 Routine Hospital Admission (non-emergent condition) _12  - 0 New Admissions / Biomedical engineer / Ordering NPWT Apligraf, etc. , _13  - 0 Emergency Hospital Admission (emergent condition) X- 1 10 Simple Discharge Coordination _14  - 0 Complex (extensive) Discharge Coordination PROCESS - Special Needs _15  - 0 Pediatric / Minor Patient Management _16  - 0 Isolation Patient Management _17  - 0 Hearing / Language / Visual special needs _18  - 0 Assessment of Community assistance (transportation, D/C planning, etc.) _19  - 0 Additional assistance / Altered  mentation _20  - 0 Support Surface(s) Assessment (bed, cushion, seat, etc.) INTERVENTIONS - Wound Cleansing / Measurement X - Simple Wound Cleansing - one wound 1 5 _21  - 0 Complex Wound  Cleansing - multiple wounds X- 1 5 Wound Imaging (photographs - any number of wounds) _0  - 0 Wound Tracing (instead of photographs) X- 1 5 Simple Wound Measurement - one wound _1  - 0 Complex Wound Measurement - multiple wounds INTERVENTIONS - Wound Dressings X - Small Wound Dressing one or multiple wounds 1 10 _2  - 0 Medium Wound Dressing one or multiple wounds _3  - 0 Large Wound Dressing one or multiple wounds X- 1 5 Application of Medications - topical <[id number]>_0<\/[id number]>_3  - 0 Application of Medications - injection INTERVENTIONS - Miscellaneous _5  - 0 External ear exam _6  - 0 Specimen Collection (cultures, biopsies, blood, body fluids, etc.) _7  - 0 Specimen(s) / Culture(s) sent or taken to Lab for analysis _8  - 0 Patient Transfer (multiple staff / Civil Service fast streamer / Similar devices) _9  - 0 Simple Staple / Suture removal (25 or less) _10  - 0 Complex Staple / Suture removal (26 or more) _11  - 0 Hypo / Hyperglycemic Management (close monitor of Blood Glucose) _12  - 0 Ankle / Brachial Index (ABI) - do not check if billed separately X- 1 5 Vital Signs Has the patient been seen at the hospital within the last three years: Yes Total Score: 90 Level Of Care: New/Established - Level 3 Electronic Signature(s) Signed: 06/13/2021 5:03:29 PM By: Rhae Hammock RN Entered By: Rhae Hammock on 06/12/2021 10:35:11 -------------------------------------------------------------------------------- Encounter Discharge Information Details Patient Name: Date of Service: [name] [name], [name] [name] 06/12/2021 10:30 A M Medical Record Number: [id number] Patient Account Number: [id number] Date of Birth/Sex: Treating RN: 09-06-1970 (50 y.o. [name], [name] Primary Care Shacoya Burkhammer: Corena Pilgrim Other Clinician: Referring  Zac Torti: Treating Marq Rebello/Extender: Glynn Octave in Treatment: 18 Encounter Discharge Information Items Discharge Condition: Stable Ambulatory Status: Wheelchair Discharge Destination: Home Transportation: Private Auto Accompanied By: son Schedule Follow-up Appointment: Yes Clinical Summary of Care: Patient Declined Electronic Signature(s) Signed: 06/13/2021 5:03:29 PM By: Rhae Hammock RN Entered By: Rhae Hammock on 06/12/2021 10:48:23 -------------------------------------------------------------------------------- Lower Extremity Assessment Details Patient Name: Date of Service: [name] [name], [name] [name] 06/12/2021 10:30 A M Medical Record Number: [id number] Patient Account Number: [id number] Date of Birth/Sex: Treating RN: 08-24-1970 (50 y.o. [name], [name] Primary Care Yuritzi Kamp: Corena Pilgrim Other Clinician: Referring Analysse Quinonez: Treating Niley Helbig/Extender: Glynn Octave in Treatment: 18 Electronic Signature(s) Signed: 06/13/2021 5:03:29 PM By: Rhae Hammock RN Entered By: Rhae Hammock on 06/12/2021 10:28:35 -------------------------------------------------------------------------------- Multi Wound Chart Details Patient Name: Date of Service: [name] [name], [name] [name] 06/12/2021 10:30 A M Medical Record Number: [id number] Patient Account Number: [id number] Date of Birth/Sex: Treating RN: April 02, 1971 (50 y.o. [name] Primary Care Dior Stepter: Corena Pilgrim Other Clinician: Referring Thelonious Kauffmann: Treating Gottlieb Zuercher/Extender: Glynn Octave in Treatment: 18 Vital Signs Height(in): Pulse(bpm): 69 Weight(lbs): Blood Pressure(mmHg): 157/97 Body Mass Index(BMI): Temperature(F): 98.3 Respiratory Rate(breaths/min): 16 Photos: [N/A:N/A] Left Gluteus N/A N/A Wound Location: Other Lesion N/A N/A Wounding Event: Infection - not elsewhere classified N/A N/A Primary Etiology: Arrhythmia, Congestive  Heart Failure, N/A N/A Comorbid History: Type II Diabetes, Neuropathy 12/23/2020 N/A N/A Date Acquired: 18 N/A N/A Weeks of Treatment: Open N/A N/A Wound Status: 8.2x3.7x1.7 N/A N/A Measurements L x W x D (cm) 23.829 N/A N/A A (cm) : rea 40.509 N/A N/A Volume (cm) : 76.30% N/A N/A % Reduction in Area: 86.60% N/A N/A % Reduction in Volume: Full Thickness Without Exposed N/A N/A Classification: Support Structures Medium N/A N/A Exudate Amount: Serosanguineous N/A N/A Exudate Type: red, brown N/A N/A Exudate Color: Well defined, not attached N/A N/A Wound  Margin: Large (67-100%) N/A N/A Granulation Amount: Red N/A N/A Granulation Quality: None Present (0%) N/A N/A Necrotic Amount: Fat Layer (Subcutaneous Tissue): Yes N/A N/A Exposed Structures: Fascia: No Tendon: No Muscle: No Joint: No Bone: No Small (1-33%) N/A N/A Epithelialization: Treatment Notes Wound #1 (Gluteus) Wound Laterality: Left Cleanser Soap and Water Discharge Instruction: May shower and wash wound with dial antibacterial soap and water prior to dressing change. Wound Cleanser Discharge Instruction: Cleanse the wound with wound cleanser prior to applying a clean dressing using gauze sponges, not tissue or cotton balls. Peri-Wound Care Skin Prep Discharge Instruction: Use skin prep as directed Topical Primary Dressing KerraCel Ag Gelling Fiber Dressing, 4x5 in (silver alginate) Discharge Instruction: Apply silver alginate to wound bed as instructed Secondary Dressing Woven Gauze Sponge, Non-Sterile 4x4 in Discharge Instruction: Apply over primary dressing as directed. ABD Pad, 8x10 Discharge Instruction: Apply over primary dressing as directed. Secured With 65M Medipore H Soft Cloth Surgical T 4 x 2 (in/yd) ape Discharge Instruction: Secure dressing with tape as directed. Compression Wrap Compression Stockings Add-Ons Electronic Signature(s) Signed: 06/13/2021 1:42:03 PM By: Linton Ham MD Signed: 06/13/2021 5:23:58 PM By: Levan Hurst RN, BSN Entered By: Linton Ham on 06/12/2021 10:46:17 -------------------------------------------------------------------------------- Multi-Disciplinary Care Plan Details Patient Name: Date of Service: [name] [name], [name] [name] 06/12/2021 10:30 A M Medical Record Number: [id number] Patient Account Number: [id number] Date of Birth/Sex: Treating RN: 1971/05/26 (50 y.o. [name], [name] Primary Care Braison Snoke: Corena Pilgrim Other Clinician: Referring Shahzain Kiester: Treating Alvena Kiernan/Extender: Glynn Octave in Treatment: 18 Multidisciplinary Care Plan reviewed with physician Active Inactive Wound/Skin Impairment Nursing Diagnoses: Knowledge deficit related to ulceration/compromised skin integrity Goals: Patient/caregiver will verbalize understanding of skin care regimen Date Initiated: 01/31/2021 Target Resolution Date: 05/31/2021 Goal Status: Active Ulcer/skin breakdown will have a volume reduction of 50% by week 8 Date Initiated: 03/11/2021 Date Inactivated: 04/23/2021 Target Resolution Date: 04/25/2021 Goal Status: Met Ulcer/skin breakdown will have a volume reduction of 80% by week 12 Date Initiated: 04/23/2021 Date Inactivated: 05/07/2021 Target Resolution Date: 05/21/2021 Unmet Reason: see wound chart Goal Status: Unmet volume 70%. Interventions: Assess patient/caregiver ability to obtain necessary supplies Assess patient/caregiver ability to perform ulcer/skin care regimen upon admission and as needed Provide education on ulcer and skin care Treatment Activities: Skin care regimen initiated : 01/31/2021 Topical wound management initiated : 01/31/2021 Notes: Electronic Signature(s) Signed: 06/13/2021 5:03:29 PM By: Rhae Hammock RN Entered By: Rhae Hammock on 06/12/2021 10:29:48 -------------------------------------------------------------------------------- Pain Assessment Details Patient  Name: Date of Service: [name] [name], [name] [name] 06/12/2021 10:30 A M Medical Record Number: [id number] Patient Account Number: [id number] Date of Birth/Sex: Treating RN: September 21, 1970 (49 y.o. [name] Primary Care Bradely Rudin: Corena Pilgrim Other Clinician: Referring Pradyun Ishman: Treating Elysabeth Aust/Extender: Glynn Octave in Treatment: 18 Active Problems Location of Pain Severity and Description of Pain Patient Has Paino Yes Site Locations Rate the pain. Current Pain Level: 8 Pain Management and Medication Current Pain Management: Electronic Signature(s) Signed: 06/12/2021 2:27:45 PM By: Sandre Kitty Signed: 06/13/2021 5:23:58 PM By: Levan Hurst RN, BSN Entered By: Sandre Kitty on 06/12/2021 10:09:58 -------------------------------------------------------------------------------- Patient/Caregiver Education Details Patient Name: Date of Service: [name] [name], [name] [name] 11/16/2022andnbsp10:30 A M Medical Record Number: [id number] Patient Account Number: [id number] Date of Birth/Gender: Treating RN: [birth year] (50 y.o. Benjaman Lobe Primary Care Physician: Corena Pilgrim Other Clinician: Referring Physician: Treating Physician/Extender: Glynn Octave in Treatment: 18 Education Assessment Education Provided To: Patient Education Topics Provided Wound/Skin Impairment: Methods: Explain/Verbal Responses: Reinforcements needed, [employer]  content correctly Electronic Signature(s) Signed: 06/13/2021 5:03:29 PM By: Rhae Hammock RN Entered By: Rhae Hammock on 06/12/2021 10:30:14 -------------------------------------------------------------------------------- Wound Assessment Details Patient Name: Date of Service: [name] [name], [name] [name] 06/12/2021 10:30 A M Medical Record Number: [id number] Patient Account Number: [id number] Date of Birth/Sex: Treating RN: 07-02-1971 (50 y.o. [name] Primary Care Marchelle Rinella: Corena Pilgrim Other Clinician: Referring Olon Russ: Treating Novi Calia/Extender: Glynn Octave in Treatment: 18 Wound Status Wound Number: 1 Primary Infection - not elsewhere classified Etiology: Wound Location: Left Gluteus Wound Status: Open Wounding Event: Other Lesion Comorbid Arrhythmia, Congestive Heart Failure, Type II Diabetes, Date Acquired: 12/23/2020 History: Neuropathy Weeks Of Treatment: 18 Clustered Wound: No Photos Wound Measurements Length: (cm) 8.2 Width: (cm) 3.7 Depth: (cm) 1.7 Area: (cm) 23.829 Volume: (cm) 40.509 % Reduction in Area: 76.3% % Reduction in Volume: 86.6% Epithelialization: Small (1-33%) Wound Description Classification: Full Thickness Without Exposed Support Structures Wound Margin: Well defined, not attached Exudate Amount: Medium Exudate Type: Serosanguineous Exudate Color: red, brown Foul Odor After Cleansing: No Slough/Fibrino No Wound Bed Granulation Amount: Large (67-100%) Exposed Structure Granulation Quality: Red Fascia Exposed: No Necrotic Amount: None Present (0%) Fat Layer (Subcutaneous Tissue) Exposed: Yes Tendon Exposed: No Muscle Exposed: No Joint Exposed: No Bone Exposed: No Treatment Notes Wound #1 (Gluteus) Wound Laterality: Left Cleanser Soap and Water Discharge Instruction: May shower and wash wound with dial antibacterial soap and water prior to dressing change. Wound Cleanser Discharge Instruction: Cleanse the wound with wound cleanser prior to applying a clean dressing using gauze sponges, not tissue or cotton balls. Peri-Wound Care Skin Prep Discharge Instruction: Use skin prep as directed Topical Primary Dressing KerraCel Ag Gelling Fiber Dressing, 4x5 in (silver alginate) Discharge Instruction: Apply silver alginate to wound bed as instructed Secondary Dressing Woven Gauze Sponge, Non-Sterile 4x4 in Discharge Instruction: Apply over primary dressing as directed. ABD Pad,  8x10 Discharge Instruction: Apply over primary dressing as directed. Secured With 85M Medipore H Soft Cloth Surgical T 4 x 2 (in/yd) ape Discharge Instruction: Secure dressing with tape as directed. Compression Wrap Compression Stockings Add-Ons Electronic Signature(s) Signed: 06/12/2021 2:27:45 PM By: Sandre Kitty Signed: 06/13/2021 5:23:58 PM By: Levan Hurst RN, BSN Entered By: Sandre Kitty on 06/12/2021 10:19:02 -------------------------------------------------------------------------------- Vitals Details Patient Name: Date of Service: [name] [name], [name] [name] 06/12/2021 10:30 A M Medical Record Number: [id number] Patient Account Number: [id number] Date of Birth/Sex: Treating RN: April 01, 1971 (50 y.o. [name] Primary Care Rica Heather: Corena Pilgrim Other Clinician: Referring Renleigh Ouellet: Treating Tauna Macfarlane/Extender: Glynn Octave in Treatment: 18 Vital Signs Time Taken: 10:09 Temperature (F): 98.3 Pulse (bpm): 86 Respiratory Rate (breaths/min): 16 Blood Pressure (mmHg): 157/97 Reference Range: 80 - 120 mg / dl Electronic Signature(s) Signed: 06/12/2021 2:27:45 PM By: Sandre Kitty Entered By: Sandre Kitty on 06/12/2021 10:09:44

## 2021-06-13 NOTE — Progress Notes (Signed)
[name] [name], [name] [name] ([id number]) Visit Report for 06/12/2021 HPI Details Patient Name: Date of Service: [name], [name] 06/12/2021 10:30 A M Medical Record Number: [id number] Patient Account Number: [id number] Date of Birth/Sex: Treating RN: [birth year] (50 y.o. [name] [name] Primary Care Provider: Corena Pilgrim Other Clinician: Referring Provider: Treating Provider/Extender: Glynn Octave in Treatment: 18 History of Present Illness HPI Description: ADMISSION 12/31/2020 This is a 50 year old woman with poorly controlled type 2 diabetes on insulin. Her problem began after a fall in late April. She developed a pimple area on her left buttock that rapidly expanded. She was admitted to hospital from 12/28/2020 through 01/19/2021 initially I believed to [facility] subsequently transferred to [facility] when she developed clear septic shock. 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 Her wound culture grew grew group B strep. She required operative debridements on 6/3 and 6/6 and subsequent bedside debridements as well. I have another culture result that showed E. coli. She was discharged on cephalexin and Augmentin she is long since finished this. She is now at home using Silvadene cream covered with ABDs. Past medical history includes new onset A. fib on Eliquis type 2 diabetes on insulin with a recent hemoglobin A1c of 11.1. As far as I can tell she is eating and drinking well she is taking Ensure at home. She has had significant weight loss dating back prior to this infection I am not exactly sure of the cause here 8/15; patient presents for follow-up. Its been 1 month since patient has last been seen. She is currently using Dakin's wet-to-dry dressings. She has no issues or complaints today. She denies signs of infection. 8/30; this is a patient have not seen in almost 2 months. She has a pressure ulcer on her left buttock and reasonably  close proximity to her anal opening. Most of this appears fairly superficial however most proximally there is an area with more depth. She apparently has poorly controlled diabetes. She has been using Dakin's wet-to-dry for about 7 weeks now. She is changing this multiple times a day her son does the dressing. She says out of concern for urine but this does not look like an area that should be getting contaminated with urine. She claims not to be incontinent. She has a level 3 pressure relief surface I think by her description 9/13; we attempted to get this patient Medihoney to aid in surface debridement however she still has not gotten this to date and has been using wet-to-dry. 9/27; patient is using Medihoney gauze to the wound surface. They are apparently changing this 2-3 times a day due to difficulty keeping dressings in place. This is on the left buttock in close proximity to the gluteal cleft. The wound measures smaller and certainly has a much cleaner surface than I was expecting to see. 10/11; patient presents for follow-up. She has been using Medihoney daily to the wound surface. She has no issues or complaints today. She tries to aggressively offload the area. She denies signs of infection. 10/25; wound surface looks healthier today. I am going to try to use silver alginate. Medihoney is cleaned up the surface of the wound 11/1; this continues to contract at his steady albeit slow rate. Healthy looking granulation we have been using silver alginate we have given her pieces of this because of her status has Medicaid changing every 2 days. There is no evidence of concurrent infection. Initially this was because of a necrotizing soft tissue  infection 11/16; wound continues to contract. We have been using silver alginate she is changing this every 2 days. She comes into clinic today asking me about right leg weakness when she is walking. Its not really clear to me when this began but it seems  sometime either after she came home from the hospital in June or thereafter. She has a walker. She does not complain of sensory loss. 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 [name] [name] Primary Care Provider: Corena Pilgrim Other Clinician: Referring Provider: Treating Provider/Extender: Glynn Octave in Treatment: 60 Constitutional Patient is hypertensive.. Pulse regular and within target range for patient.Marland Kitchen Respirations regular, non-labored and within target range.. Temperature is normal and within the target range for the patient.Marland Kitchen Appears in no distress. Neurological Likely absent knee jerks bilaterally. The patient has right greater than left diffuse lower extremity weakness. More problematically proximally. Her arm seem normal. There is no pronator drift. Notes Wound exam; left gluteus near the gluteal cleft. Healthy looking granulation there is still an undermining area superiorly. Everything appears to be gradually contracting. There is no need for debridement, granulation looks quite good Electronic Signature(s) Signed: 06/13/2021 1:42:03 PM By: Linton Ham MD Entered By: Linton Ham on 06/12/2021 10:50:05 -------------------------------------------------------------------------------- Physician Orders Details Patient Name: Date of Service: CA Windy Kalata, Adaleen 06/12/2021 10:30 A M Medical Record Number: [id number] Patient Account Number: [id number] Date of Birth/Sex: Treating RN: 03-07-1971 (50 y.o. [name],  [name] Primary Care Provider: Corena Pilgrim Other Clinician: Referring Provider: Treating Provider/Extender: Glynn Octave in Treatment: 18 Verbal / Phone Orders: No Diagnosis Coding Follow-up Appointments Return appointment in 3 weeks. - Dr. Arcola Jansky Shower/ Hygiene May shower and wash wound with soap and water. Off-Loading Low air-loss mattress (Group 2) - continue to use. Turn and reposition every 2 hours - only up for meals and back to bed. Additional Orders / Instructions Follow Nutritious Diet - increase protein, eat three meals a day. Wound Treatment Wound #1 - Gluteus Wound Laterality: Left Cleanser: Soap and Water 1 x Per Day/30 Days Discharge Instructions: May shower and wash wound with dial antibacterial soap and water prior to dressing change. Cleanser: Wound Cleanser 1 x Per Day/30 Days Discharge Instructions: Cleanse the wound with wound cleanser prior to applying a clean dressing using gauze sponges, not tissue or cotton balls. Peri-Wound Care: Skin Prep 1 x Per Day/30 Days Discharge Instructions: Use skin prep as directed Prim Dressing: KerraCel Ag Gelling Fiber Dressing, 4x5 in (silver alginate) 1 x Per Day/30 Days ary Discharge Instructions: Apply silver alginate to wound bed as instructed Secondary Dressing: Woven Gauze Sponge, Non-Sterile 4x4 in 1 x Per Day/30 Days Discharge Instructions: Apply over primary dressing as directed. Secondary Dressing: ABD Pad, 8x10 1 x Per Day/30 Days Discharge Instructions: Apply over primary dressing as directed. Secured With: 25M Medipore H Soft Cloth Surgical T 4 x 2 (in/yd) 1 x Per Day/30 Days ape Discharge Instructions: Secure dressing with tape as directed. Electronic Signature(s) Signed: 06/13/2021 1:42:03 PM By: Linton Ham MD Signed: 06/13/2021 5:03:29 PM By: Rhae Hammock RN Entered By: Rhae Hammock on 06/12/2021  10:29:29 -------------------------------------------------------------------------------- Problem List Details Patient Name:  Date of Service: MEAGHEN, VECCHIARELLI 06/12/2021 10:30 A M Medical Record Number: [id number] Patient Account Number: [id number] Date of Birth/Sex: Treating RN: 1971/03/09 (50 y.o. Wynelle Link Primary Care Provider: Theophilus Kinds Other Clinician: Referring Provider: Treating Provider/Extender: Rockne Coons in Treatment: 18 Active Problems ICD-10 Encounter Code Description Active Date MDM Diagnosis T81.31XD Disruption of external operation (surgical) wound, not elsewhere classified, 01/31/2021 No Yes subsequent encounter S31.829D Unspecified open wound of left buttock, subsequent encounter 01/31/2021 No Yes M72.6 Necrotizing fasciitis 01/31/2021 No Yes E11.622 Type 2 diabetes mellitus with other skin ulcer 01/31/2021 No Yes R26.89 Other abnormalities of gait and mobility 06/12/2021 No Yes Inactive Problems Resolved Problems Electronic Signature(s) Signed: 06/13/2021 1:42:03 PM By: Baltazar Najjar MD Entered By: Baltazar Najjar on 06/12/2021 10:45:49 -------------------------------------------------------------------------------- Progress Note Details Patient Name: Date of Service: [name], Kimberlynn 06/12/2021 10:30 A M Medical Record Number: [id number] Patient Account Number: [id number] Date of Birth/Sex: Treating RN: Jan 13, 1971 (50 y.o. Wynelle Link Primary Care Provider: Theophilus Kinds Other Clinician: Referring Provider: Treating Provider/Extender: Rockne Coons in Treatment: 18 Subjective History of Present Illness (HPI) ADMISSION 12/31/2020 This is a 50 year old woman with poorly controlled type 2 diabetes on insulin. Her problem began after a fall in late April. She developed a pimple area on her left buttock that rapidly expanded. She was admitted to hospital from 12/28/2020 through 01/19/2021 initially I  believed to [facility] subsequently transferred to [facility] when she developed clear septic shock. 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 Her wound culture grew grew group B strep. She required operative debridements on 6/3 and 6/6 and subsequent bedside debridements as well. I have another culture result that showed E. coli. She was discharged on cephalexin and Augmentin she is long since finished this. She is now at home using Silvadene cream covered with ABDs. Past medical history includes new onset A. fib on Eliquis type 2 diabetes on insulin with a recent hemoglobin A1c of 11.1. As far as I can tell she is eating and drinking well she is taking Ensure at home. She has had significant weight loss dating back prior to this infection I am not exactly sure of the cause here 8/15; patient presents for follow-up. Its been 1 month since patient has last been seen. She is currently using Dakin's wet-to-dry dressings. She has no issues or complaints today. She denies signs of infection. 8/30; this is a patient have not seen in almost 2 months. She has a pressure ulcer on her left buttock and reasonably close proximity to her anal opening. Most of this appears fairly superficial however most proximally there is an area with more depth. She apparently has poorly controlled diabetes. She has been using Dakin's wet-to-dry for about 7 weeks now. She is changing this multiple times a day her son does the dressing. She says out of concern for urine but this does not look like an area that should be getting contaminated with urine. She claims not to be incontinent. She has a level 3 pressure relief surface I think by her description 9/13; we attempted to get this patient Medihoney to aid in surface debridement however she still has not gotten this to date and has been using wet-to-dry. 9/27; patient is using Medihoney gauze to the wound surface. They are apparently  changing this 2-3 times a day due to difficulty keeping dressings in place. This is on the left buttock in close proximity to the  gluteal cleft. The wound measures smaller and certainly has a much cleaner surface than I was expecting to see. 10/11; patient presents for follow-up. She has been using Medihoney daily to the wound surface. She has no issues or complaints today. She tries to aggressively offload the area. She denies signs of infection. 10/25; wound surface looks healthier today. I am going to try to use silver alginate. Medihoney is cleaned up the surface of the wound 11/1; this continues to contract at his steady albeit slow rate. Healthy looking granulation we have been using silver alginate we have given her pieces of this because of her status has Medicaid changing every 2 days. There is no evidence of concurrent infection. Initially this was because of a necrotizing soft tissue infection 11/16; wound continues to contract. We have been using silver alginate she is changing this every 2 days. She comes into clinic today asking me about right leg weakness when she is walking. Its not really clear to me when this began but it seems sometime either after she came home from the hospital in June or thereafter. She has a walker. She does not complain of sensory loss. She tells me that she "was told she did not have a stroke" Objective Constitutional Patient is hypertensive.. Pulse regular and within target range for patient.Marland Kitchen Respirations regular, non-labored and within target range.. Temperature is normal and within the target range for the patient.Marland Kitchen Appears in no distress. Vitals Time Taken: 10:09 AM, Temperature: 98.3 F, Pulse: 86 bpm, Respiratory Rate: 16 breaths/min, Blood Pressure: 157/97 mmHg. Neurological Likely absent knee jerks bilaterally. The patient has right greater than left diffuse lower extremity weakness. More problematically proximally. Her arm seem normal. There is  no pronator drift. General Notes: Wound exam; left gluteus near the gluteal cleft. Healthy looking granulation there is still an undermining area superiorly. Everything appears to be gradually contracting. There is no need for debridement, granulation looks quite good Integumentary (Hair, Skin) Wound #1 status is Open. Original cause of wound was Other Lesion. The date acquired was: 12/23/2020. The wound has been in treatment 18 weeks. The wound is located on the Left Gluteus. The wound measures 8.2cm length x 3.7cm width x 1.7cm depth; 23.829cm^2 area and 40.509cm^3 volume. There is Fat Layer (Subcutaneous Tissue) exposed. There is a medium amount of serosanguineous drainage noted. The wound margin is well defined and not attached to the wound base. There is large (67-100%) red granulation within the wound bed. There is no necrotic tissue within the wound bed. Assessment Active Problems ICD-10 Disruption of external operation (surgical) wound, not elsewhere classified, subsequent encounter Unspecified open wound of left buttock, subsequent encounter Necrotizing fasciitis Type 2 diabetes mellitus with other skin ulcer Other abnormalities of gait and mobility Plan Follow-up Appointments: Return appointment in 3 weeks. - Dr. Dellia Nims Bathing/ Shower/ Hygiene: May shower and wash wound with soap and water. Off-Loading: Low air-loss mattress (Group 2) - continue to use. Turn and reposition every 2 hours - only up for meals and back to bed. Additional Orders / Instructions: Follow Nutritious Diet - increase protein, eat three meals a day. WOUND #1: - Gluteus Wound Laterality: Left Cleanser: Soap and Water 1 x Per Day/30 Days Discharge Instructions: May shower and wash wound with dial antibacterial soap and water prior to dressing change. Cleanser: Wound Cleanser 1 x Per Day/30 Days Discharge Instructions: Cleanse the wound with wound cleanser prior to applying a clean dressing using gauze  sponges, not tissue or cotton balls. Peri-Wound  Care: Skin Prep 1 x Per Day/30 Days Discharge Instructions: Use skin prep as directed Prim Dressing: KerraCel Ag Gelling Fiber Dressing, 4x5 in (silver alginate) 1 x Per Day/30 Days ary Discharge Instructions: Apply silver alginate to wound bed as instructed Secondary Dressing: Woven Gauze Sponge, Non-Sterile 4x4 in 1 x Per Day/30 Days Discharge Instructions: Apply over primary dressing as directed. Secondary Dressing: ABD Pad, 8x10 1 x Per Day/30 Days Discharge Instructions: Apply over primary dressing as directed. Secured With: 22M Medipore H Soft Cloth Surgical T 4 x 2 (in/yd) 1 x Per Day/30 Days ape Discharge Instructions: Secure dressing with tape as directed. #1 I continued with the silver alginate for now. This is a reasonably inexpensive dressing covering large surface areas although we may be able to switch soon to [facility]. Covering ABD 2. She seems to be doing a good job of offloading this area. 3. With regards to her lower extremity weakness this is not just the right leg that she complained about its both legs. 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 She has good bowel and bladder control no back issues. Nevertheless I felt it was important that she get this worked up. I have asked her to call her primary doctor and get a referral to neurology. I suspect she is going to need EMGs and nerve conduction studies. I will remember to ask her about this next time Electronic Signature(s) Signed: 06/13/2021 1:42:03 PM By: Linton Ham MD Entered By: Linton Ham on 06/12/2021 10:53:50 -------------------------------------------------------------------------------- SuperBill Details Patient Name: Date of Service: SYVANNA, BADURA 06/12/2021 Medical Record Number: [id number] Patient Account Number: [id number] Date of Birth/Sex: Treating RN: Jun 16, 1971 (50 y.o. [name],  [name] Primary Care Provider: Corena Pilgrim Other Clinician: Referring Provider: Treating Provider/Extender: Glynn Octave in Treatment: 18 Diagnosis Coding ICD-10 Codes Code Description T81.31XD Disruption of external operation (surgical) wound, not elsewhere classified, subsequent encounter S31.829D Unspecified open wound of left buttock, subsequent encounter M72.6 Necrotizing fasciitis E11.622 Type 2 diabetes mellitus with other skin ulcer R26.89 Other abnormalities of gait and mobility Facility Procedures Physician Procedures : CPT4 Code Description Modifier [id number] - WC PHYS LEVEL 4 - EST PT ICD-10 Diagnosis Description T81.31XD Disruption of external operation (surgical) wound, not elsewhere classified, subsequent encounter S31.829D Unspecified open wound of left  buttock, subsequent encounter M72.6 Necrotizing fasciitis R26.89 Other abnormalities of gait and mobility Quantity: 1 Electronic Signature(s) Signed: 06/13/2021 1:42:03 PM By: Linton Ham MD Entered By: Linton Ham on 06/12/2021 10:54:24

## 2021-07-03 ENCOUNTER — Encounter (HOSPITAL_BASED_OUTPATIENT_CLINIC_OR_DEPARTMENT_OTHER): Payer: Medicaid Other | Attending: Internal Medicine | Admitting: Internal Medicine

## 2021-07-03 ENCOUNTER — Other Ambulatory Visit: Payer: Self-pay

## 2021-07-03 DIAGNOSIS — T8131XA Disruption of external operation (surgical) wound, not elsewhere classified, initial encounter: Secondary | ICD-10-CM | POA: Diagnosis present

## 2021-07-03 DIAGNOSIS — M726 Necrotizing fasciitis: Secondary | ICD-10-CM | POA: Insufficient documentation

## 2021-07-03 DIAGNOSIS — R2689 Other abnormalities of gait and mobility: Secondary | ICD-10-CM | POA: Insufficient documentation

## 2021-07-03 DIAGNOSIS — Y839 Surgical procedure, unspecified as the cause of abnormal reaction of the patient, or of later complication, without mention of misadventure at the time of the procedure: Secondary | ICD-10-CM | POA: Diagnosis not present

## 2021-07-03 NOTE — Progress Notes (Signed)
JERICHA, BRYDEN ([id number]) Visit Report for 07/03/2021 Arrival Information Details Patient Name: Date of Service: NAMIAH, DUNNAVANT 07/03/2021 10:30 A M Medical Record Number: [id number] Patient Account Number: [id number] Date of Birth/Sex: Treating RN: 12/07/1970 (50 y.o. [name], [name] Primary Care Amadi Frady: Corena Pilgrim Other Clinician: Referring Reiko Vinje: Treating Loana Salvaggio/Extender: Glynn Octave in Treatment: 21 Visit Information History Since Last Visit Added or deleted any medications: No Patient Arrived: Wheel Chair Any new allergies or adverse reactions: No Arrival Time: 10:35 Had a fall or experienced change in No Accompanied By: son activities of daily living that may affect Transfer Assistance: Manual risk of falls: Patient Identification Verified: Yes Signs or symptoms of abuse/neglect since last visito No Secondary Verification Process Completed: Yes Hospitalized since last visit: No Patient Requires Transmission-Based Precautions: No Implantable device outside of the clinic excluding No Patient Has Alerts: Yes cellular tissue based products placed in the center Patient Alerts: Patient on Blood Thinner since last visit: Has Dressing in Place as Prescribed: Yes Pain Present Now: No Electronic Signature(s) Signed: 07/03/2021 4:34:42 PM By: Rhae Hammock RN Entered By: Rhae Hammock on 07/03/2021 10:35:52 -------------------------------------------------------------------------------- Encounter Discharge Information Details Patient Name: Date of Service: [place] Windy Kalata, Dontavia 07/03/2021 10:30 A M Medical Record Number: [id number] Patient Account Number: [id number] Date of Birth/Sex: Treating RN: 06/10/1971 (50 y.o. [name], [name] Primary Care Zeena Starkel: Corena Pilgrim Other Clinician: Referring Miho Monda: Treating Presley Gora/Extender: Glynn Octave in Treatment: 21 Encounter Discharge Information Items Post  Procedure Vitals Discharge Condition: Stable Temperature (F): 98.7 Ambulatory Status: Wheelchair Pulse (bpm): 74 Discharge Destination: Home Respiratory Rate (breaths/min): 17 Transportation: Private Auto Blood Pressure (mmHg): 134/74 Accompanied By: son Schedule Follow-up Appointment: Yes Clinical Summary of Care: Patient Declined Electronic Signature(s) Signed: 07/03/2021 4:34:42 PM By: Rhae Hammock RN Entered By: Rhae Hammock on 07/03/2021 11:00:57 -------------------------------------------------------------------------------- Lower Extremity Assessment Details Patient Name: Date of Service: NONNIE, PICKNEY 07/03/2021 10:30 A M Medical Record Number: [id number] Patient Account Number: [id number] Date of Birth/Sex: Treating RN: [birth year] (50 y.o. [name], [name] Primary Care Vaughn Beaumier: Corena Pilgrim Other Clinician: Referring Carlyn Lemke: Treating Tonilynn Bieker/Extender: Glynn Octave in Treatment: 21 Electronic Signature(s) Signed: 07/03/2021 4:34:42 PM By: Rhae Hammock RN Entered By: Rhae Hammock on 07/03/2021 10:36:55 -------------------------------------------------------------------------------- Multi Wound Chart Details Patient Name: Date of Service: CA Windy Kalata, Anab 07/03/2021 10:30 A M Medical Record Number: [id number] Patient Account Number: [id number] Date of Birth/Sex: Treating RN: 16-Sep-1970 (50 y.o. [name] Primary Care Colie Fugitt: Corena Pilgrim Other Clinician: Referring Tymika Grilli: Treating Mar Walmer/Extender: Glynn Octave in Treatment: 21 Vital Signs Height(in): Pulse(bpm): 54 Weight(lbs): Blood Pressure(mmHg): 147/74 Body Mass Index(BMI): Temperature(F): 98.4 Respiratory Rate(breaths/min): 17 Photos: [N/A:N/A] Left Gluteus N/A N/A Wound Location: Other Lesion N/A N/A Wounding Event: Infection - not elsewhere classified N/A N/A Primary Etiology: Arrhythmia, Congestive Heart  Failure, N/A N/A Comorbid History: Type II Diabetes, Neuropathy 12/23/2020 N/A N/A Date Acquired: 21 N/A N/A Weeks of Treatment: Open N/A N/A Wound Status: 8x4x2 N/A N/A Measurements L x W x D (cm) 25.133 N/A N/A A (cm) : rea 50.265 N/A N/A Volume (cm) : 75.00% N/A N/A % Reduction in A rea: 83.30% N/A N/A % Reduction in Volume: 12 Starting Position 1 (o'clock): 1 Ending Position 1 (o'clock): 1 Maximum Distance 1 (cm): Yes N/A N/A Undermining: Full Thickness Without Exposed N/A N/A Classification: Support Structures Medium N/A N/A Exudate Amount: Serosanguineous N/A N/A Exudate Type: red, brown N/A N/A Exudate Color: Well defined, not attached N/A N/A Wound Margin:  Large (67-100%) N/A N/A Granulation Amount: Red N/A N/A Granulation Quality: None Present (0%) N/A N/A Necrotic Amount: Fat Layer (Subcutaneous Tissue): Yes N/A N/A Exposed Structures: Fascia: No Tendon: No Muscle: No Joint: No Bone: No Small (1-33%) N/A N/A Epithelialization: Treatment Notes Electronic Signature(s) Signed: 07/03/2021 3:48:37 PM By: Robson, Michael MD Signed: 07/03/2021 5:24:14 PM By: Lynch, Shatara RN, BSN Entered By: Robson, Michael on 07/03/2021 10:57:43 -------------------------------------------------------------------------------- Multi-Disciplinary Care Plan Details Patient Name: Date of Service: CA SSIDY, Alexah 07/03/2021 10:30 A M Medical Record Number: [id number] Patient Account Number: [id number] Date of Birth/Sex: Treating RN: 09/12/1970 (50 y.o. F) Breedlove, [name] Primary Care : Murphy, John Other Clinician: Referring : Treating /Extender: Robson, Michael Murphy, John Weeks in Treatment: 21 Multidisciplinary Care Plan reviewed with physician Active Inactive Wound/Skin Impairment Nursing Diagnoses: Knowledge deficit related to ulceration/compromised skin integrity Goals: Patient/caregiver will verbalize understanding of skin  care regimen Date Initiated: 01/31/2021 Target Resolution Date: 06/29/2021 Goal Status: Active Ulcer/skin breakdown will have a volume reduction of 50% by week 8 Date Initiated: 03/11/2021 Date Inactivated: 04/23/2021 Target Resolution Date: 04/25/2021 Goal Status: Met Ulcer/skin breakdown will have a volume reduction of 80% by week 12 Date Initiated: 04/23/2021 Date Inactivated: 05/07/2021 Target Resolution Date: 05/21/2021 Unmet Reason: see wound chart Goal Status: Unmet volume 70%. Interventions: Assess patient/caregiver ability to obtain necessary supplies Assess patient/caregiver ability to perform ulcer/skin care regimen upon admission and as needed Provide education on ulcer and skin care Treatment Activities: Skin care regimen initiated : 01/31/2021 Topical wound management initiated : 01/31/2021 Notes: Electronic Signature(s) Signed: 07/03/2021 4:34:42 PM By: Breedlove, [name] RN Entered By: Breedlove, [name] on 07/03/2021 10:55:10 -------------------------------------------------------------------------------- Pain Assessment Details Patient Name: Date of Service: CA SSIDY, Korayma 07/03/2021 10:30 A M Medical Record Number: [id number] Patient Account Number: [id number] Date of Birth/Sex: Treating RN: 11/27/1970 (50 y.o. F) Breedlove, [name] Primary Care : Murphy, John Other Clinician: Referring : Treating /Extender: Robson, Michael Murphy, John Weeks in Treatment: 21 Active Problems Location of Pain Severity and Description of Pain Patient Has Paino Yes Site Locations With Dressing Change: Yes Duration of the Pain. Constant / Intermittento Intermittent Rate the pain. Current Pain Level: 8 Worst Pain Level: 10 Least Pain Level: 0 Tolerable Pain Level: 8 Character of Pain Describe the Pain: Aching Pain Management and Medication Current Pain Management: Medication: No Cold Application: No Rest: No Massage: No Activity: No T.E.N.S.:  No Heat Application: No Leg drop or elevation: No Is the Current Pain Management Adequate: Adequate How does your wound impact your activities of daily livingo Sleep: No Bathing: No Appetite: No Relationship With Others: No Bladder Continence: No Emotions: No Bowel Continence: No Work: No Toileting: No Drive: No Dressing: No Hobbies: No Electronic Signature(s) Signed: 07/03/2021 4:34:42 PM By: Breedlove, [name] RN Entered By: Breedlove, [name] on 07/03/2021 10:36:44 -------------------------------------------------------------------------------- Patient/Caregiver Education Details Patient Name: Date of Service: CA SSIDY, Marsella 12/7/2022andnbsp10:30 A M Medical Record Number: [id number] Patient Account Number: [id number] Date of Birth/Gender: Treating RN: 01/09/1971 (50 y.o. F) Breedlove, [name] Primary Care Physician: Murphy, John Other Clinician: Referring Physician: Treating Physician/Extender: Robson, Michael Murphy, John Weeks in Treatment: 21 Education Assessment Education Provided To: Patient Education Topics Provided Wound/Skin Impairment: Methods: Explain/Verbal Responses: State content correctly Electronic Signature(s) Signed: 07/03/2021 4:34:42 PM By: Breedlove, [name] RN Entered By: Breedlove, [name] on 07/03/2021 10:52:24 -------------------------------------------------------------------------------- Wound Assessment Details Patient Name: Date of Service: CA SSIDY, Shawny 07/03/2021 10:30 A M Medical Record Number: [id number] Patient Account Number: [id number] Date of Birth/Sex: Treating RN: 11/22/1970 (50 y.o. F)   Rhae Hammock Primary Care Ming Mcmannis: Corena Pilgrim Other Clinician: Referring Sonny Anthes: Treating Ahrianna Siglin/Extender: Glynn Octave in Treatment: 21 Wound Status Wound Number: 1 Primary Infection - not elsewhere classified Etiology: Wound Location: Left Gluteus Wound Status: Open Wounding Event: Other  Lesion Comorbid Arrhythmia, Congestive Heart Failure, Type II Diabetes, Date Acquired: 12/23/2020 History: Neuropathy Weeks Of Treatment: 21 Clustered Wound: No Photos Wound Measurements Length: (cm) 8 Width: (cm) 4 Depth: (cm) 2 Area: (cm) 25.133 Volume: (cm) 50.265 % Reduction in Area: 75% % Reduction in Volume: 83.3% Epithelialization: Small (1-33%) Undermining: Yes Starting Position (o'clock): 12 Ending Position (o'clock): 1 Maximum Distance: (cm) 1 Wound Description Classification: Full Thickness Without Exposed Support Structures Wound Margin: Well defined, not attached Exudate Amount: Medium Exudate Type: Serosanguineous Exudate Color: red, brown Foul Odor After Cleansing: No Slough/Fibrino No Wound Bed Granulation Amount: Large (67-100%) Exposed Structure Granulation Quality: Red Fascia Exposed: No Necrotic Amount: None Present (0%) Fat Layer (Subcutaneous Tissue) Exposed: Yes Tendon Exposed: No Muscle Exposed: No Joint Exposed: No Bone Exposed: No Treatment Notes Wound #1 (Gluteus) Wound Laterality: Left Cleanser Soap and Water Discharge Instruction: May shower and wash wound with dial antibacterial soap and water prior to dressing change. Wound Cleanser Discharge Instruction: Cleanse the wound with wound cleanser prior to applying a clean dressing using gauze sponges, not tissue or cotton balls. Peri-Wound Care Skin Prep Discharge Instruction: Use skin prep as directed Topical Primary Dressing KerraCel Ag Gelling Fiber Dressing, 4x5 in (silver alginate) Discharge Instruction: Apply silver alginate to wound bed as instructed Secondary Dressing Woven Gauze Sponge, Non-Sterile 4x4 in Discharge Instruction: Apply over primary dressing as directed. ABD Pad, 8x10 Discharge Instruction: Apply over primary dressing as directed. Secured With 53M Medipore H Soft Cloth Surgical T 4 x 2 (in/yd) ape Discharge Instruction: Secure dressing with tape as  directed. Compression Wrap Compression Stockings Add-Ons Electronic Signature(s) Signed: 07/03/2021 4:34:42 PM By: Rhae Hammock RN Entered By: Rhae Hammock on 07/03/2021 10:42:19 -------------------------------------------------------------------------------- Vitals Details Patient Name: Date of Service: CA Windy Kalata, Georgene 07/03/2021 10:30 A M Medical Record Number: [id number] Patient Account Number: [id number] Date of Birth/Sex: Treating RN: August 30, 1970 (50 y.o. [name], [name] Primary Care Loel Betancur: Corena Pilgrim Other Clinician: Referring Dinisha Cai: Treating Ren Grasse/Extender: Glynn Octave in Treatment: 21 Vital Signs Time Taken: 10:35 Temperature (F): 98.4 Pulse (bpm): 74 Respiratory Rate (breaths/min): 17 Blood Pressure (mmHg): 147/74 Reference Range: 80 - 120 mg / dl Electronic Signature(s) Signed: 07/03/2021 4:34:42 PM By: Rhae Hammock RN Entered By: Rhae Hammock on 07/03/2021 10:36:14

## 2021-07-03 NOTE — Progress Notes (Signed)
[name] [name], [name] [name] ([id number]) Visit Report for 07/03/2021 Debridement Details Patient Name: Date of Service: [name] [name], [name] [name] 07/03/2021 10:30 A M Medical Record Number: [id number] Patient Account Number: [id number] Date of Birth/Sex: Treating RN: 1970-11-05 (50 y.o. [name] Primary Care Provider: Corena Pilgrim Other Clinician: Referring Provider: Treating Provider/Extender: Glynn Octave in Treatment: 21 Debridement Performed for Assessment: Wound #1 Left Gluteus Performed By: Physician Ricard Dillon., MD Debridement Type: Debridement Level of Consciousness (Pre-procedure): Awake and Alert Pre-procedure Verification/Time Out Yes - 10:55 Taken: Start Time: 10:55 Pain Control: Lidocaine T Area Debrided (L x W): otal 8 (cm) x 4 (cm) = 32 (cm) Tissue and other material debrided: Viable, Non-Viable, Slough, Subcutaneous, Skin: Dermis , Skin: Epidermis, Slough Level: Skin/Subcutaneous Tissue Debridement Description: Excisional Instrument: Curette Bleeding: Minimum Hemostasis Achieved: Pressure End Time: 10:55 Procedural Pain: 0 Post Procedural Pain: 0 Response to Treatment: Procedure was tolerated well Level of Consciousness (Post- Awake and Alert procedure): Post Debridement Measurements of Total Wound Length: (cm) 8 Width: (cm) 4 Depth: (cm) 2 Volume: (cm) 50.265 Character of Wound/Ulcer Post Debridement: Improved Post Procedure Diagnosis Same as Pre-procedure Electronic Signature(s) Signed: 07/03/2021 3:48:37 PM By: Linton Ham MD Signed: 07/03/2021 5:24:14 PM By: Levan Hurst RN, BSN Entered By: Linton Ham on 07/03/2021 11:02:10 -------------------------------------------------------------------------------- HPI Details Patient Name: Date of Service: [name] [name], [name] [name] 07/03/2021 10:30 A M Medical Record Number: [id number] Patient Account Number: [id number] Date of Birth/Sex: Treating RN: [birth year] (50 y.o. [name] Primary Care Provider: Corena Pilgrim Other Clinician: Referring Provider: Treating Provider/Extender: Glynn Octave in Treatment: 21 History of Present Illness HPI Description: ADMISSION 12/31/2020 This is a 50 year old woman with poorly controlled type 2 diabetes on insulin. Her problem began after a fall in late April. She developed a pimple area on her left buttock that rapidly expanded. She was admitted to hospital from 12/28/2020 through 01/19/2021 initially I believed to [facility] subsequently transferred to [facility] when she developed clear septic shock. 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 Her wound culture grew grew group B strep. She required operative debridements on 6/3 and 6/6 and subsequent bedside debridements as well. I have another culture result that showed E. coli. She was discharged on cephalexin and Augmentin she is long since finished this. She is now at home using Silvadene cream covered with ABDs. Past medical history includes new onset A. fib on Eliquis type 2 diabetes on insulin with a recent hemoglobin A1c of 11.1. As far as I can tell she is eating and drinking well she is taking Ensure at home. She has had significant weight loss dating back prior to this infection I am not exactly sure of the cause here 8/15; patient presents for follow-up. Its been 1 month since patient has last been seen. She is currently using Dakin's wet-to-dry dressings. She has no issues or complaints today. She denies signs of infection. 8/30; this is a patient have not seen in almost 2 months. She has a pressure ulcer on her left buttock and reasonably close proximity to her anal opening. Most of this appears fairly superficial however most proximally there is an area with more depth. She apparently has poorly controlled diabetes. She has been using Dakin's wet-to-dry for about 7 weeks now. She is changing this multiple  times a day her son does the dressing. She says out of concern for urine but this does not look like an area that should be getting  contaminated with urine. She claims not to be incontinent. She has a level 3 pressure relief surface I think by her description 9/13; we attempted to get this patient Medihoney to aid in surface debridement however she still has not gotten this to date and has been using wet-to-dry. 9/27; patient is using Medihoney gauze to the wound surface. They are apparently changing this 2-3 times a day due to difficulty keeping dressings in place. This is on the left buttock in close proximity to the gluteal cleft. The wound measures smaller and certainly has a much cleaner surface than I was expecting to see. 10/11; patient presents for follow-up. She has been using Medihoney daily to the wound surface. She has no issues or complaints today. She tries to aggressively offload the area. She denies signs of infection. 10/25; wound surface looks healthier today. I am going to try to use silver alginate. Medihoney is cleaned up the surface of the wound 11/1; this continues to contract at his steady albeit slow rate. Healthy looking granulation we have been using silver alginate we have given her pieces of this because of her status has Medicaid changing every 2 days. There is no evidence of concurrent infection. Initially this was because of a necrotizing soft tissue infection 11/16; wound continues to contract. We have been using silver alginate she is changing this every 2 days. She comes into clinic today asking me about right leg weakness when she is walking. Its not really clear to me when this began but it seems sometime either after she came home from the hospital in June or thereafter. She has a walker. She does not complain of sensory loss. She tells me that she "was told she did not have a stroke" 12/7; not really much change in her wound surface area. Using silver alginate  and she is changing this twice a day for reasons that I think are most to do with sanitation of the dressing. In any case there is not a lot of options here. I was hoping to change to [facility] at some point but we certainly could not supply enough Hydrofera Blue to change this twice a day. Electronic Signature(s) Signed: 07/03/2021 3:48:37 PM By: Linton Ham MD Entered By: Linton Ham on 07/03/2021 10:58:58 -------------------------------------------------------------------------------- Physical Exam Details Patient Name: Date of Service: [name] [name], [name] [name] 07/03/2021 10:30 A M Medical Record Number: [id number] Patient Account Number: [id number] Date of Birth/Sex: Treating RN: 10/06/1970 (50 y.o. [name] Primary Care Provider: Corena Pilgrim Other Clinician: Referring Provider: Treating Provider/Extender: Glynn Octave in Treatment: 21 Constitutional Patient is hypertensive.. Pulse regular and within target range for patient.Marland Kitchen Respirations regular, non-labored and within target range.. Temperature is normal and within the target range for the patient.Marland Kitchen Appears in no distress. Notes Wound exam; left gluteus near the gluteal cleft. Granulation not as healthy today gritty fibrinous debris on the surface. I used an open curette to debride this as quickly as I could. She does not tolerate this well. There is no evidence of surrounding infection. Electronic Signature(s) Signed: 07/03/2021 3:48:37 PM By: Linton Ham MD Entered By: Linton Ham on 07/03/2021 11:00:03 -------------------------------------------------------------------------------- Physician Orders Details Patient Name: Date of Service: [name] [name], [name] [name] 07/03/2021 10:30 A M Medical Record Number: [id number] Patient Account Number: [id number] Date of Birth/Sex: Treating RN: March 28, 1971 (49 y.o. [name], [name] Primary Care Provider: Corena Pilgrim Other Clinician: Referring  Provider: Treating Provider/Extender: Glynn Octave in Treatment: 21 Verbal / Phone Orders: No  Diagnosis Coding Follow-up Appointments ppointment in 2 weeks. - Dr. Dellia Nims Return A Bathing/ Shower/ Hygiene [name] [name] shower and wash wound with soap and water. Off-Loading Low air-loss mattress (Group 2) - continue to use. Turn and reposition every 2 hours - only up for meals and back to bed. Additional Orders / Instructions Follow Nutritious Diet - increase protein, eat three meals a day. Wound Treatment Wound #1 - Gluteus Wound Laterality: Left Cleanser: Soap and Water 1 x Per Day/30 Days Discharge Instructions: [name] [name] shower and wash wound with dial antibacterial soap and water prior to dressing change. Cleanser: Wound Cleanser 1 x Per Day/30 Days Discharge Instructions: Cleanse the wound with wound cleanser prior to applying a clean dressing using gauze sponges, not tissue or cotton balls. Peri-Wound Care: Skin Prep 1 x Per Day/30 Days Discharge Instructions: Use skin prep as directed Prim Dressing: KerraCel Ag Gelling Fiber Dressing, 4x5 in (silver alginate) 1 x Per Day/30 Days ary Discharge Instructions: Apply silver alginate to wound bed as instructed Secondary Dressing: Woven Gauze Sponge, Non-Sterile 4x4 in 1 x Per Day/30 Days Discharge Instructions: Apply over primary dressing as directed. Secondary Dressing: ABD Pad, 8x10 1 x Per Day/30 Days Discharge Instructions: Apply over primary dressing as directed. Secured With: 65M Medipore H Soft Cloth Surgical T 4 x 2 (in/yd) 1 x Per Day/30 Days ape Discharge Instructions: Secure dressing with tape as directed. Electronic Signature(s) Signed: 07/03/2021 3:48:37 PM By: Linton Ham MD Signed: 07/03/2021 4:34:42 PM By: Rhae Hammock RN Entered By: Rhae Hammock on 07/03/2021 10:50:57 -------------------------------------------------------------------------------- Problem List Details Patient Name: Date of  Service: [name] [name], [name] [name] 07/03/2021 10:30 A M Medical Record Number: [id number] Patient Account Number: [id number] Date of Birth/Sex: Treating RN: 15-Jan-1971 (50 y.o. [name] Primary Care Provider: Corena Pilgrim Other Clinician: Referring Provider: Treating Provider/Extender: Glynn Octave in Treatment: 21 Active Problems ICD-10 Encounter Code Description Active Date MDM Diagnosis T81.31XD Disruption of external operation (surgical) wound, not elsewhere classified, 01/31/2021 No Yes subsequent encounter S31.829D Unspecified open wound of left buttock, subsequent encounter 01/31/2021 No Yes M72.6 Necrotizing fasciitis 01/31/2021 No Yes E11.622 Type 2 diabetes mellitus with other skin ulcer 01/31/2021 No Yes R26.89 Other abnormalities of gait and mobility 06/12/2021 No Yes Inactive Problems Resolved Problems Electronic Signature(s) Signed: 07/03/2021 3:48:37 PM By: Linton Ham MD Entered By: Linton Ham on 07/03/2021 10:57:31 -------------------------------------------------------------------------------- Progress Note Details Patient Name: Date of Service: [name] [name], [name] [name] 07/03/2021 10:30 A M Medical Record Number: [id number] Patient Account Number: [id number] Date of Birth/Sex: Treating RN: 11-Mar-1971 (50 y.o. [name] Primary Care Provider: Corena Pilgrim Other Clinician: Referring Provider: Treating Provider/Extender: Glynn Octave in Treatment: 21 Subjective History of Present Illness (HPI) ADMISSION 12/31/2020 This is a 50 year old woman with poorly controlled type 2 diabetes on insulin. Her problem began after a fall in late April. She developed a pimple area on her left buttock that rapidly expanded. She was admitted to hospital from 12/28/2020 through 01/19/2021 initially I believed to [facility] subsequently transferred to [facility] when she developed clear septic shock. She was diagnosed with a necrotizing soft  tissue infection of the left buttock. Her wound culture grew grew group B strep. She required operative debridements on 6/3 and 6/6 and subsequent bedside debridements as well. I have another culture result that showed E. coli. She was discharged on cephalexin and Augmentin she is long since finished this. She is now at home using Silvadene cream covered with ABDs. Past medical history includes new  onset A. fib on Eliquis type 2 diabetes on insulin with a recent hemoglobin A1c of 11.1. As far as I can tell she is eating and drinking well she is taking Ensure at home. She has had significant weight loss dating back prior to this infection I am not exactly sure of the cause here 8/15; patient presents for follow-up. Its been 1 month since patient has last been seen. She is currently using Dakin's wet-to-dry dressings. She has no issues or complaints today. She denies signs of infection. 8/30; this is a patient have not seen in almost 2 months. She has a pressure ulcer on her left buttock and reasonably close proximity to her anal opening. Most of this appears fairly superficial however most proximally there is an area with more depth. She apparently has poorly controlled diabetes. She has been using Dakin's wet-to-dry for about 7 weeks now. She is changing this multiple times a day her son does the dressing. She says out of concern for urine but this does not look like an area that should be getting contaminated with urine. She claims not to be incontinent. She has a level 3 pressure relief surface I think by her description 9/13; we attempted to get this patient Medihoney to aid in surface debridement however she still has not gotten this to date and has been using wet-to-dry. 9/27; patient is using Medihoney gauze to the wound surface. They are apparently changing this 2-3 times a day due to difficulty keeping dressings in place. This is on the left buttock in close proximity to the gluteal cleft. The  wound measures smaller and certainly has a much cleaner surface than I was expecting to see. 10/11; patient presents for follow-up. She has been using Medihoney daily to the wound surface. She has no issues or complaints today. She tries to aggressively offload the area. She denies signs of infection. 10/25; wound surface looks healthier today. I am going to try to use silver alginate. Medihoney is cleaned up the surface of the wound 11/1; this continues to contract at his steady albeit slow rate. Healthy looking granulation we have been using silver alginate we have given her pieces of this because of her status has Medicaid changing every 2 days. There is no evidence of concurrent infection. Initially this was because of a necrotizing soft tissue infection 11/16; wound continues to contract. We have been using silver alginate she is changing this every 2 days. She comes into clinic today asking me about right leg weakness when she is walking. Its not really clear to me when this began but it seems sometime either after she came home from the hospital in June or thereafter. She has a walker. She does not complain of sensory loss. She tells me that she "was told she did not have a stroke" 12/7; not really much change in her wound surface area. Using silver alginate and she is changing this twice a day for reasons that I think are most to do with sanitation of the dressing. In any case there is not a lot of options here. I was hoping to change to [facility] at some point but we certainly could not supply enough Hydrofera Blue to change this twice a day. Objective Constitutional Patient is hypertensive.. Pulse regular and within target range for patient.Marland Kitchen Respirations regular, non-labored and within target range.. Temperature is normal and within the target range for the patient.Marland Kitchen Appears in no distress. Vitals Time Taken: 10:35 AM, Temperature: 98.4 F, Pulse:  74 bpm, Respiratory Rate: 17  breaths/min, Blood Pressure: 147/74 mmHg. General Notes: Wound exam; left gluteus near the gluteal cleft. Granulation not as healthy today gritty fibrinous debris on the surface. I used an open curette to debride this as quickly as I could. She does not tolerate this well. There is no evidence of surrounding infection. Integumentary (Hair, Skin) Wound #1 status is Open. Original cause of wound was Other Lesion. The date acquired was: 12/23/2020. The wound has been in treatment 21 weeks. The wound is located on the Left Gluteus. The wound measures 8cm length x 4cm width x 2cm depth; 25.133cm^2 area and 50.265cm^3 volume. There is Fat Layer (Subcutaneous Tissue) exposed. There is undermining starting at 12:00 and ending at 1:00 with a maximum distance of 1cm. There is a medium amount of serosanguineous drainage noted. The wound margin is well defined and not attached to the wound base. There is large (67-100%) red granulation within the wound bed. There is no necrotic tissue within the wound bed. Assessment Active Problems ICD-10 Disruption of external operation (surgical) wound, not elsewhere classified, subsequent encounter Unspecified open wound of left buttock, subsequent encounter Necrotizing fasciitis Type 2 diabetes mellitus with other skin ulcer Other abnormalities of gait and mobility Procedures Wound #1 Pre-procedure diagnosis of Wound #1 is an Infection - not elsewhere classified located on the Left Gluteus . There was a Excisional Skin/Subcutaneous Tissue Debridement with a total area of 32 sq cm performed by Maxwell Caul., MD. With the following instrument(s): Curette to remove Viable and Non-Viable tissue/material. Material removed includes Subcutaneous Tissue, Slough, Skin: Dermis, and Skin: Epidermis after achieving pain control using Lidocaine. No specimens were taken. A time out was conducted at 10:55, prior to the start of the procedure. A Minimum amount of bleeding was  controlled with Pressure. The procedure was tolerated well with a pain level of 0 throughout and a pain level of 0 following the procedure. Post Debridement Measurements: 8cm length x 4cm width x 2cm depth; 50.265cm^3 volume. Character of Wound/Ulcer Post Debridement is improved. Post procedure Diagnosis Wound #1: Same as Pre-Procedure Plan Follow-up Appointments: Return Appointment in 2 weeks. - Dr. Leanord Hawking Bathing/ Shower/ Hygiene: [name] [name] shower and wash wound with soap and water. Off-Loading: Low air-loss mattress (Group 2) - continue to use. Turn and reposition every 2 hours - only up for meals and back to bed. Additional Orders / Instructions: Follow Nutritious Diet - increase protein, eat three meals a day. WOUND #1: - Gluteus Wound Laterality: Left Cleanser: Soap and Water 1 x Per Day/30 Days Discharge Instructions: [name] [name] shower and wash wound with dial antibacterial soap and water prior to dressing change. Cleanser: Wound Cleanser 1 x Per Day/30 Days Discharge Instructions: Cleanse the wound with wound cleanser prior to applying a clean dressing using gauze sponges, not tissue or cotton balls. Peri-Wound Care: Skin Prep 1 x Per Day/30 Days Discharge Instructions: Use skin prep as directed Prim Dressing: KerraCel Ag Gelling Fiber Dressing, 4x5 in (silver alginate) 1 x Per Day/30 Days ary Discharge Instructions: Apply silver alginate to wound bed as instructed Secondary Dressing: Woven Gauze Sponge, Non-Sterile 4x4 in 1 x Per Day/30 Days Discharge Instructions: Apply over primary dressing as directed. Secondary Dressing: ABD Pad, 8x10 1 x Per Day/30 Days Discharge Instructions: Apply over primary dressing as directed. Secured With: 72M Medipore H Soft Cloth Surgical T 4 x 2 (in/yd) 1 x Per Day/30 Days ape Discharge Instructions: Secure dressing with tape as directed. #1 really not a lot  of alternative to address this with and something that we can cover the surface area and supply the  patient who does not have insurance. 2. I had some hope of switching her to [facility] but they are changing it twice a day 3. She claims to be offloading this 4. She required a debridement today to get rid of the very fibrinous gritty surface. Hemostasis with direct pressure Electronic Signature(s) Signed: 07/03/2021 3:48:37 PM By: Linton Ham MD Entered By: Linton Ham on 07/03/2021 11:01:19 -------------------------------------------------------------------------------- SuperBill Details Patient Name: Date of Service: [name] [name], [name] [name] 07/03/2021 Medical Record Number: [id number] Patient Account Number: [id number] Date of Birth/Sex: Treating RN: 06-Nov-1970 (50 y.o. [name], [name] Primary Care Provider: Corena Pilgrim Other Clinician: Referring Provider: Treating Provider/Extender: Glynn Octave in Treatment: 21 Diagnosis Coding ICD-10 Codes Code Description T81.31XD Disruption of external operation (surgical) wound, not elsewhere classified, subsequent encounter S31.829D Unspecified open wound of left buttock, subsequent encounter M72.6 Necrotizing fasciitis E11.622 Type 2 diabetes mellitus with other skin ulcer R26.89 Other abnormalities of gait and mobility Facility Procedures CPT4 Code: [id number] Description: [id number] - DEB SUBQ TISSUE 20 SQ CM/< ICD-10 Diagnosis Description S31.829D Unspecified open wound of left buttock, subsequent encounter Modifier: Quantity: 1 Physician Procedures : CPT4 Code Description Modifier [id number] - WC PHYS SUBQ TISS 20 SQ CM ICD-10 Diagnosis Description S31.829D Unspecified open wound of left buttock, subsequent encounter Quantity: 1 : [id number] - WC PHYS SUBQ TISS EA ADDL 20 CM ICD-10 Diagnosis Description S31.829D Unspecified open wound of left buttock, subsequent encounter Quantity: 1 Electronic Signature(s) Signed: 07/03/2021 3:48:37 PM By: Linton Ham MD Entered By: Linton Ham on  07/03/2021 11:02:36

## 2021-07-17 ENCOUNTER — Encounter (HOSPITAL_BASED_OUTPATIENT_CLINIC_OR_DEPARTMENT_OTHER): Payer: Medicaid Other | Admitting: Internal Medicine

## 2021-08-20 ENCOUNTER — Encounter (HOSPITAL_BASED_OUTPATIENT_CLINIC_OR_DEPARTMENT_OTHER): Payer: Medicaid Other | Attending: Internal Medicine | Admitting: Internal Medicine

## 2021-08-20 ENCOUNTER — Other Ambulatory Visit: Payer: Self-pay

## 2021-08-20 DIAGNOSIS — Z7901 Long term (current) use of anticoagulants: Secondary | ICD-10-CM | POA: Diagnosis not present

## 2021-08-20 DIAGNOSIS — Z794 Long term (current) use of insulin: Secondary | ICD-10-CM | POA: Insufficient documentation

## 2021-08-20 DIAGNOSIS — S31829A Unspecified open wound of left buttock, initial encounter: Secondary | ICD-10-CM | POA: Diagnosis not present

## 2021-08-20 DIAGNOSIS — T8131XD Disruption of external operation (surgical) wound, not elsewhere classified, subsequent encounter: Secondary | ICD-10-CM | POA: Diagnosis not present

## 2021-08-20 DIAGNOSIS — E11622 Type 2 diabetes mellitus with other skin ulcer: Secondary | ICD-10-CM | POA: Diagnosis present

## 2021-08-20 DIAGNOSIS — E1140 Type 2 diabetes mellitus with diabetic neuropathy, unspecified: Secondary | ICD-10-CM | POA: Insufficient documentation

## 2021-08-20 DIAGNOSIS — E1151 Type 2 diabetes mellitus with diabetic peripheral angiopathy without gangrene: Secondary | ICD-10-CM | POA: Diagnosis not present

## 2021-08-20 DIAGNOSIS — I4891 Unspecified atrial fibrillation: Secondary | ICD-10-CM | POA: Insufficient documentation

## 2021-08-20 DIAGNOSIS — M726 Necrotizing fasciitis: Secondary | ICD-10-CM | POA: Diagnosis not present

## 2021-09-03 NOTE — Progress Notes (Signed)
[name], [name] ([id number]) Visit Report for 08/20/2021 Arrival Information Details Patient Name: Date of Service: [name] [name], [name] [name] 08/20/2021 1:30 PM Medical Record Number: [id number] Patient Account Number: [id number] Date of Birth/Sex: Treating RN: Jul 08, 1971 (51 y.o. [name] [name] Primary Care Torez Beauregard: Corena Pilgrim Other Clinician: Referring Oslo Huntsman: Treating Nico Rogness/Extender: Glynn Octave in Treatment: 62 Visit Information History Since Last Visit Added or deleted any medications: No Patient Arrived: Gilford Rile Any new allergies or adverse reactions: No Arrival Time: 13:37 Had a fall or experienced change in No Accompanied By: son activities of daily living that may affect Transfer Assistance: Manual risk of falls: Patient Identification Verified: Yes Signs or symptoms of abuse/neglect since last visito No Secondary Verification Process Completed: Yes Hospitalized since last visit: No Patient Requires Transmission-Based Precautions: No Implantable device outside of the clinic excluding No Patient Has Alerts: Yes cellular tissue based products placed in the center Patient Alerts: Patient on Blood Thinner since last visit: Has Dressing in Place as Prescribed: Yes Pain Present Now: Yes Electronic Signature(s) Signed: 08/22/2021 3:23:49 PM By: Sandre Kitty Entered By: Sandre Kitty on 08/20/2021 13:37:28 -------------------------------------------------------------------------------- Clinic Level of Care Assessment Details Patient Name: Date of Service: [name], [name] 08/20/2021 1:30 PM Medical Record Number: [id number] Patient Account Number: [id number] Date of Birth/Sex: Treating RN: [birth year] (50 y.o. [name] [name] Primary Care Max Romano: Corena Pilgrim Other Clinician: Referring Amarya Kuehl: Treating Vian Fluegel/Extender: Glynn Octave in Treatment: 28 Clinic Level of Care Assessment Items TOOL 4 Quantity Score X- 1  0 Use when only an EandM is performed on FOLLOW-UP visit ASSESSMENTS - Nursing Assessment / Reassessment X- 1 10 Reassessment of Co-morbidities (includes updates in patient status) X- 1 5 Reassessment of Adherence to Treatment Plan ASSESSMENTS - Wound and Skin A ssessment / Reassessment X - Simple Wound Assessment / Reassessment - one wound 1 5 []  - 0 Complex Wound Assessment / Reassessment - multiple wounds []  - 0 Dermatologic / Skin Assessment (not related to wound area) ASSESSMENTS - Focused Assessment []  - 0 Circumferential Edema Measurements - multi extremities []  - 0 Nutritional Assessment / Counseling / Intervention []  - 0 Lower Extremity Assessment (monofilament, tuning fork, pulses) []  - 0 Peripheral Arterial Disease Assessment (using hand held doppler) ASSESSMENTS - Ostomy and/or Continence Assessment and Care []  - 0 Incontinence Assessment and Management []  - 0 Ostomy Care Assessment and Management (repouching, etc.) PROCESS - Coordination of Care X - Simple Patient / Family Education for ongoing care 1 15 []  - 0 Complex (extensive) Patient / Family Education for ongoing care X- 1 10 Staff obtains Programmer, systems, Records, T Results / Process Orders est []  - 0 Staff telephones HHA, Nursing Homes / Clarify orders / etc []  - 0 Routine Transfer to another Facility (non-emergent condition) []  - 0 Routine Hospital Admission (non-emergent condition) []  - 0 New Admissions / Biomedical engineer / Ordering NPWT Apligraf, etc. , []  - 0 Emergency Hospital Admission (emergent condition) X- 1 10 Simple Discharge Coordination []  - 0 Complex (extensive) Discharge Coordination PROCESS - Special Needs []  - 0 Pediatric / Minor Patient Management []  - 0 Isolation Patient Management []  - 0 Hearing / Language / Visual special needs []  - 0 Assessment of Community assistance (transportation, D/C planning, etc.) []  - 0 Additional assistance / Altered mentation []  -  0 Support Surface(s) Assessment (bed, cushion, seat, etc.) INTERVENTIONS - Wound Cleansing / Measurement X - Simple Wound Cleansing - one wound 1 5 []  - 0 Complex Wound Cleansing - multiple  wounds X- 1 5 Wound Imaging (photographs - any number of wounds) $RemoveBe'[]'rWldaXjmE$  - 0 Wound Tracing (instead of photographs) X- 1 5 Simple Wound Measurement - one wound $RemoveB'[]'WcisLjNI$  - 0 Complex Wound Measurement - multiple wounds INTERVENTIONS - Wound Dressings $RemoveBeforeD'[]'sVLYOejlxwzRSV$  - 0 Small Wound Dressing one or multiple wounds X- 1 15 Medium Wound Dressing one or multiple wounds $RemoveBeforeD'[]'UrLLpmELiLFyFy$  - 0 Large Wound Dressing one or multiple wounds $RemoveBeforeD'[]'gsNicwslBJYqys$  - 0 Application of Medications - topical $RemoveB'[]'qPaHABiJ$  - 0 Application of Medications - injection INTERVENTIONS - Miscellaneous $RemoveBeforeD'[]'yHAJNiuhCWMmia$  - 0 External ear exam $Remove'[]'BZQMEJb$  - 0 Specimen Collection (cultures, biopsies, blood, body fluids, etc.) $RemoveBefor'[]'zooTfHvSvFdC$  - 0 Specimen(s) / Culture(s) sent or taken to Lab for analysis $RemoveBefo'[]'ZeAaHAHOsnj$  - 0 Patient Transfer (multiple staff / Civil Service fast streamer / Similar devices) $RemoveBeforeDE'[]'AlJMjUxKpcwYCqI$  - 0 Simple Staple / Suture removal (25 or less) $Remove'[]'VROLbqI$  - 0 Complex Staple / Suture removal (26 or more) $Remove'[]'mEUGtAt$  - 0 Hypo / Hyperglycemic Management (close monitor of Blood Glucose) $RemoveBefore'[]'cxTSFjjlpeJrb$  - 0 Ankle / Brachial Index (ABI) - do not check if billed separately X- 1 5 Vital Signs Has the patient been seen at the hospital within the last three years: Yes Total Score: 90 Level Of Care: New/Established - Level 3 Electronic Signature(s) Signed: 09/03/2021 8:14:39 AM By: Sharyn Creamer RN, BSN Entered By: Sharyn Creamer on 08/20/2021 15:54:22 -------------------------------------------------------------------------------- Encounter Discharge Information Details Patient Name: Date of Service: [name], [name] 08/20/2021 1:30 PM Medical Record Number: [id number] Patient Account Number: [id number] Date of Birth/Sex: Treating RN: August 03, 1970 (51 y.o. [name] [name] Primary Care Gwenn Teodoro: Corena Pilgrim Other Clinician: Referring Holger Sokolowski: Treating  Lannie Heaps/Extender: Glynn Octave in Treatment: 28 Encounter Discharge Information Items Discharge Condition: Stable Ambulatory Status: Walker Discharge Destination: Home Transportation: Private Auto Accompanied By: son Schedule Follow-up Appointment: Yes Clinical Summary of Care: Patient Declined Electronic Signature(s) Signed: 09/03/2021 8:14:39 AM By: Sharyn Creamer RN, BSN Entered By: Sharyn Creamer on 08/20/2021 16:25:56 -------------------------------------------------------------------------------- Multi Wound Chart Details Patient Name: Date of Service: CA LAVERGNE, HILTUNEN 08/20/2021 1:30 PM Medical Record Number: [id number] Patient Account Number: [id number] Date of Birth/Sex: Treating RN: Nov 26, 1970 (51 y.o. [name] [name] Primary Care Cassaundra Rasch: Corena Pilgrim Other Clinician: Referring Curtisha Bendix: Treating Norleen Xie/Extender: Glynn Octave in Treatment: 28 Vital Signs Height(in): Pulse(bpm): 78 Weight(lbs): Blood Pressure(mmHg): 139/72 Body Mass Index(BMI): Temperature(F): 98.3 Respiratory Rate(breaths/min): 17 Photos: [1:Left Gluteus] [N/A:N/A N/A] Wound Location: [1:Other Lesion] [N/A:N/A] Wounding Event: [1:Infection - not elsewhere classified N/A] Primary Etiology: [1:Arrhythmia, Congestive Heart Failure, N/A] Comorbid History: [1:Type II Diabetes, Neuropathy 12/23/2020] [N/A:N/A] Date Acquired: [1:28] [N/A:N/A] Weeks of Treatment: [1:Open] [N/A:N/A] Wound Status: [1:No] [N/A:N/A] Wound Recurrence: [1:Yes] [N/A:N/A] Clustered Wound: [1:2] [N/A:N/A] Clustered Quantity: [1:9x1.5x1] [N/A:N/A] Measurements L x W x D (cm) [1:10.603] [N/A:N/A] A (cm) : rea [1:10.603] [N/A:N/A] Volume (cm) : [1:89.50%] [N/A:N/A] % Reduction in Area: [1:96.50%] [N/A:N/A] % Reduction in Volume: [1:Full Thickness Without Exposed] [N/A:N/A] Classification: [1:Support Structures Medium] [N/A:N/A] Exudate Amount: [1:Serosanguineous]  [N/A:N/A] Exudate Type: [1:red, brown] [N/A:N/A] Exudate Color: [1:Well defined, not attached] [N/A:N/A] Wound Margin: [1:Large (67-100%)] [N/A:N/A] Granulation Amount: [1:Red] [N/A:N/A] Granulation Quality: [1:None Present (0%)] [N/A:N/A] Necrotic Amount: [1:Fat Layer (Subcutaneous Tissue): Yes N/A] Exposed Structures: [1:Fascia: No Tendon: No Muscle: No Joint: No Bone: No Medium (34-66%)] [N/A:N/A] Treatment Notes Electronic Signature(s) Signed: 08/20/2021 4:35:26 PM By: Linton Ham MD Signed: 08/20/2021 5:46:53 PM By: Levan Hurst RN, BSN Entered By: Linton Ham on 08/20/2021 14:36:32 -------------------------------------------------------------------------------- Multi-Disciplinary Care Plan Details Patient Name: Date of Service: CA Windy Kalata, Keirah 08/20/2021 1:30 PM  Medical Record Number: [id number] Patient Account Number: [id number] Date of Birth/Sex: Treating RN: March 03, 1971 (51 y.o. [name] [name] Primary Care Owen Pratte: Corena Pilgrim Other Clinician: Referring Kirin Brandenburger: Treating Julieanna Geraci/Extender: Glynn Octave in Treatment: 28 Multidisciplinary Care Plan reviewed with physician Active Inactive Wound/Skin Impairment Nursing Diagnoses: Knowledge deficit related to ulceration/compromised skin integrity Goals: Patient/caregiver will verbalize understanding of skin care regimen Date Initiated: 01/31/2021 Target Resolution Date: 09/20/2021 Goal Status: Active Ulcer/skin breakdown will have a volume reduction of 50% by week 8 Date Initiated: 03/11/2021 Date Inactivated: 04/23/2021 Target Resolution Date: 04/25/2021 Goal Status: Met Ulcer/skin breakdown will have a volume reduction of 80% by week 12 Date Initiated: 04/23/2021 Date Inactivated: 05/07/2021 Target Resolution Date: 05/21/2021 Unmet Reason: see wound chart Goal Status: Unmet volume 70%. Interventions: Assess patient/caregiver ability to obtain necessary supplies Assess  patient/caregiver ability to perform ulcer/skin care regimen upon admission and as needed Provide education on ulcer and skin care Treatment Activities: Skin care regimen initiated : 01/31/2021 Topical wound management initiated : 01/31/2021 Notes: Electronic Signature(s) Signed: 08/20/2021 5:46:53 PM By: Levan Hurst RN, BSN Entered By: Levan Hurst on 08/20/2021 14:36:41 -------------------------------------------------------------------------------- Pain Assessment Details Patient Name: Date of Service: TAITE, BALDASSARI 08/20/2021 1:30 PM Medical Record Number: [id number] Patient Account Number: [id number] Date of Birth/Sex: Treating RN: 05-16-1971 (51 y.o. [name] [name] Primary Care Kayly Kriegel: Corena Pilgrim Other Clinician: Referring Jarvis Knodel: Treating Logyn Dedominicis/Extender: Glynn Octave in Treatment: 28 Active Problems Location of Pain Severity and Description of Pain Patient Has Paino Yes Site Locations Rate the pain. Current Pain Level: 5 Pain Management and Medication Current Pain Management: Electronic Signature(s) Signed: 08/20/2021 5:46:53 PM By: Levan Hurst RN, BSN Signed: 08/22/2021 3:23:49 PM By: Sandre Kitty Entered By: Sandre Kitty on 08/20/2021 13:38:23 -------------------------------------------------------------------------------- Patient/Caregiver Education Details Patient Name: Date of Service: EMMILEE, REAMER 1/24/2023andnbsp1:30 PM Medical Record Number: [id number] Patient Account Number: [id number] Date of Birth/Gender: Treating RN: May 15, 1971 (51 y.o. [name] [name] Primary Care Physician: Corena Pilgrim Other Clinician: Referring Physician: Treating Physician/Extender: Glynn Octave in Treatment: 28 Education Assessment Education Provided To: Patient Education Topics Provided Wound/Skin Impairment: Methods: Explain/Verbal Responses: State content correctly [employer]) Signed: 09/03/2021 8:14:39 AM By: Sharyn Creamer RN, BSN Entered By: Sharyn Creamer on 08/20/2021 15:53:51 -------------------------------------------------------------------------------- Wound Assessment Details Patient Name: Date of Service: BRITTAINY, BUCKER 08/20/2021 1:30 PM Medical Record Number: [id number] Patient Account Number: [id number] Date of Birth/Sex: Treating RN: 11-16-1970 (51 y.o. [name], [name] Primary Care Camari Wisham: Corena Pilgrim Other Clinician: Referring Lorilynn Lehr: Treating Sharai Overbay/Extender: Glynn Octave in Treatment: 28 Wound Status Wound Number: 1 Primary Infection - not elsewhere classified Etiology: Wound Location: Left Gluteus Wound Status: Open Wounding Event: Other Lesion Comorbid Arrhythmia, Congestive Heart Failure, Type II Diabetes, Date Acquired: 12/23/2020 History: Neuropathy Weeks Of Treatment: 28 Clustered Wound: Yes Photos Wound Measurements Length: (cm) 9 Width: (cm) 1.5 Depth: (cm) 1 Clustered Quantity: 2 Area: (cm) 10.603 Volume: (cm) 10.603 % Reduction in Area: 89.5% % Reduction in Volume: 96.5% Epithelialization: Medium (34-66%) Tunneling: No Undermining: No Wound Description Classification: Full Thickness Without Exposed Support Structures Wound Margin: Well defined, not attached Exudate Amount: Medium Exudate Type: Serosanguineous Exudate Color: red, brown Foul Odor After Cleansing: No Slough/Fibrino No Wound Bed Granulation Amount: Large (67-100%) Exposed Structure Granulation Quality: Red Fascia Exposed: No Necrotic Amount: None Present (0%) Fat Layer (Subcutaneous Tissue) Exposed: Yes Tendon Exposed: No Muscle Exposed: No Joint Exposed: No Bone Exposed: No Treatment Notes Wound #1 (Gluteus) Wound  Laterality: Left Cleanser Soap and Water Discharge Instruction: May shower and wash wound with dial antibacterial soap and water prior to dressing change. Wound  Cleanser Discharge Instruction: Cleanse the wound with wound cleanser prior to applying a clean dressing using gauze sponges, not tissue or cotton balls. Peri-Wound Care Skin Prep Discharge Instruction: Use skin prep as directed Topical Primary Dressing KerraCel Ag Gelling Fiber Dressing, 4x5 in (silver alginate) Discharge Instruction: Apply silver alginate to wound bed as instructed Secondary Dressing Woven Gauze Sponge, Non-Sterile 4x4 in Discharge Instruction: Apply over primary dressing as directed. ABD Pad, 8x10 Discharge Instruction: Apply over primary dressing as directed. Secured With [employer] Surgical T 2x10 (in/yd) ape Discharge Instruction: Secure with tape as directed. Compression Wrap Compression Stockings Add-Ons Electronic Signature(s) Signed: 08/20/2021 5:46:53 PM By: Levan Hurst RN, BSN Signed: 08/22/2021 5:37:29 PM By: Rhae Hammock RN Entered By: Levan Hurst on 08/20/2021 14:29:00 -------------------------------------------------------------------------------- Vitals Details Patient Name: Date of Service: CA Windy Kalata, Kemoni 08/20/2021 1:30 PM Medical Record Number: [id number] Patient Account Number: [id number] Date of Birth/Sex: Treating RN: [birth year] (51 y.o. [name] [name] Primary Care Suzanne Kho: Other Clinician: Corena Pilgrim Referring Laine Fonner: Treating Altheia Shafran/Extender: Glynn Octave in Treatment: 28 Vital Signs Time Taken: 13:38 Temperature (F): 98.3 Pulse (bpm): 78 Respiratory Rate (breaths/min): 17 Blood Pressure (mmHg): 139/72 Reference Range: 80 - 120 mg / dl Electronic Signature(s) Signed: 08/22/2021 3:23:49 PM By: Sandre Kitty Entered By: Sandre Kitty on 08/20/2021 13:38:11

## 2021-09-03 NOTE — Progress Notes (Signed)
[name] [name] ([id number]) Visit Report for 08/20/2021 HPI Details Patient Name: Date of Service: [name] [name], [name] [name] 08/20/2021 1:30 PM Medical Record Number: [id number] Patient Account Number: [id number] Date of Birth/Sex: Treating RN: 10/12/1970 (51 y.o. [name] Primary Care Provider: Corena Pilgrim Other Clinician: Referring Provider: Treating Provider/Extender: Glynn Octave in Treatment: 28 History of Present Illness HPI Description: ADMISSION 12/31/2020 This is a 51 year old woman with poorly controlled type 2 diabetes on insulin. Her problem began after a fall in late April. She developed a pimple area on her left buttock that rapidly expanded. She was admitted to hospital from 12/28/2020 through 01/19/2021 initially I believed to [facility] subsequently transferred to [facility] when she developed clear septic shock. 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 Her wound culture grew grew group B strep. She required operative debridements on 6/3 and 6/6 and subsequent bedside debridements as well. I have another culture result that showed E. coli. She was discharged on cephalexin and Augmentin she is long since finished this. She is now at home using Silvadene cream covered with ABDs. Past medical history includes new onset A. fib on Eliquis type 2 diabetes on insulin with a recent hemoglobin A1c of 11.1. As far as I can tell she is eating and drinking well she is taking Ensure at home. She has had significant weight loss dating back prior to this infection I am not exactly sure of the cause here 8/15; patient presents for follow-up. Its been 1 month since patient has last been seen. She is currently using Dakin's wet-to-dry dressings. She has no issues or complaints today. She denies signs of infection. 8/30; this is a patient have not seen in almost 2 months. She has a pressure ulcer on her left buttock and reasonably close  proximity to her anal opening. Most of this appears fairly superficial however most proximally there is an area with more depth. She apparently has poorly controlled diabetes. She has been using Dakin's wet-to-dry for about 7 weeks now. She is changing this multiple times a day her son does the dressing. She says out of concern for urine but this does not look like an area that should be getting contaminated with urine. She claims not to be incontinent. She has a level 3 pressure relief surface I think by her description 9/13; we attempted to get this patient Medihoney to aid in surface debridement however she still has not gotten this to date and has been using wet-to-dry. 9/27; patient is using Medihoney gauze to the wound surface. They are apparently changing this 2-3 times a day due to difficulty keeping dressings in place. This is on the left buttock in close proximity to the gluteal cleft. The wound measures smaller and certainly has a much cleaner surface than I was expecting to see. 10/11; patient presents for follow-up. She has been using Medihoney daily to the wound surface. She has no issues or complaints today. She tries to aggressively offload the area. She denies signs of infection. 10/25; wound surface looks healthier today. I am going to try to use silver alginate. Medihoney is cleaned up the surface of the wound 11/1; this continues to contract at his steady albeit slow rate. Healthy looking granulation we have been using silver alginate we have given her pieces of this because of her status has Medicaid changing every 2 days. There is no evidence of concurrent infection. Initially this was because of a necrotizing soft tissue infection  11/16; wound continues to contract. We have been using silver alginate she is changing this every 2 days. She comes into clinic today asking me about right leg weakness when she is walking. Its not really clear to me when this began but it seems  sometime either after she came home from the hospital in June or thereafter. She has a walker. She does not complain of sensory loss. She tells me that she "was told she did not have a stroke" 12/7; not really much change in her wound surface area. Using silver alginate and she is changing this twice a day for reasons that I think are most to do with sanitation of the dressing. In any case there is not a lot of options here. I was hoping to change to [facility] at some point but we certainly could not supply enough Hydrofera Blue to change this twice a day. 1/24; the wound is split into 2 proximal and distal. The proximal 1 still has more depth there is healing between the 2. Using silver alginate they are changing it twice a day Electronic Signature(s) Signed: 08/20/2021 4:35:26 PM By: Linton Ham MD Entered By: Linton Ham on 08/20/2021 14:37:20 -------------------------------------------------------------------------------- Physical Exam Details Patient Name: Date of Service: [name] [name] 08/20/2021 1:30 PM Medical Record Number: [id number] Patient Account Number: [id number] Date of Birth/Sex: Treating RN: 09/26/1970 (51 y.o. [name] Primary Care Provider: Corena Pilgrim Other Clinician: Referring Provider: Treating Provider/Extender: Glynn Octave in Treatment: 28 Constitutional Sitting or standing Blood Pressure is within target range for patient.. Pulse regular and within target range for patient.Marland Kitchen Respirations regular, non-labored and within target range.. Temperature is normal and within the target range for the patient.Marland Kitchen Appears in no distress. Notes Wound exam; left gluteus near the gluteal cleft. Now down to 2 areas nicely granulated. The proximal area has more depth. There is no evidence of surrounding infection They had me look at an area on her right mid gluteus. A small nodule. Somewhat tender there is no obvious surrounding  infection. Electronic Signature(s) Signed: 08/20/2021 4:35:26 PM By: Linton Ham MD Entered By: Linton Ham on 08/20/2021 14:38:15 -------------------------------------------------------------------------------- Physician Orders Details Patient Name: Date of Service: PNINA, ARMAO 08/20/2021 1:30 PM Medical Record Number: [id number] Patient Account Number: [id number] Date of Birth/Sex: Treating RN: 09/12/1970 (51 y.o. [name] Primary Care Provider: Corena Pilgrim Other Clinician: Referring Provider: Treating Provider/Extender: Glynn Octave in Treatment: 28 Verbal / Phone Orders: No Diagnosis Coding ICD-10 Coding Code Description T81.31XD Disruption of external operation (surgical) wound, not elsewhere classified, subsequent encounter S31.829D Unspecified open wound of left buttock, subsequent encounter M72.6 Necrotizing fasciitis E11.622 Type 2 diabetes mellitus with other skin ulcer R26.89 Other abnormalities of gait and mobility Follow-up Appointments Return appointment in 1 month. - Dr. Dellia Nims Bathing/ Shower/ Hygiene May shower and wash wound with soap and water. Off-Loading Low air-loss mattress (Group 2) - continue to use. Turn and reposition every 2 hours - only up for meals and back to bed. Additional Orders / Instructions Follow Nutritious Diet - increase protein, eat three meals a day. Wound Treatment Wound #1 - Gluteus Wound Laterality: Left Cleanser: Soap and Water 1 x Per Day/30 Days Discharge Instructions: May shower and wash wound with dial antibacterial soap and water prior to dressing change. Cleanser: Wound Cleanser 1 x Per Day/30 Days Discharge Instructions: Cleanse the wound with wound cleanser prior to applying a clean dressing using gauze sponges, not tissue or cotton  balls. Peri-Wound Care: Skin Prep 1 x Per Day/30 Days Discharge Instructions: Use skin prep as directed Prim Dressing: KerraCel Ag Gelling Fiber  Dressing, 4x5 in (silver alginate) 1 x Per Day/30 Days ary Discharge Instructions: Apply silver alginate to wound bed as instructed Secondary Dressing: Woven Gauze Sponge, Non-Sterile 4x4 in 1 x Per Day/30 Days Discharge Instructions: Apply over primary dressing as directed. Secondary Dressing: ABD Pad, 8x10 1 x Per Day/30 Days Discharge Instructions: Apply over primary dressing as directed. Secured With: 46M Medipore Public affairs consultant Surgical T 2x10 (in/yd) 1 x Per Day/30 Days ape Discharge Instructions: Secure with tape as directed. Electronic Signature(s) Signed: 08/20/2021 4:35:26 PM By: Linton Ham MD Signed: 08/20/2021 5:46:53 PM By: Levan Hurst RN, BSN Entered By: Levan Hurst on 08/20/2021 14:32:00 -------------------------------------------------------------------------------- Problem List Details Patient Name: Date of Service: CA MELVINA, BEDINGER 08/20/2021 1:30 PM Medical Record Number: [id number] Patient Account Number: [id number] Date of Birth/Sex: Treating RN: Jan 06, 1971 (51 y.o. [name] Primary Care Provider: Corena Pilgrim Other Clinician: Referring Provider: Treating Provider/Extender: Glynn Octave in Treatment: 28 Active Problems ICD-10 Encounter Code Description Active Date MDM Diagnosis T81.31XD Disruption of external operation (surgical) wound, not elsewhere classified, 01/31/2021 No Yes subsequent encounter S31.829D Unspecified open wound of left buttock, subsequent encounter 01/31/2021 No Yes M72.6 Necrotizing fasciitis 01/31/2021 No Yes E11.622 Type 2 diabetes mellitus with other skin ulcer 01/31/2021 No Yes R26.89 Other abnormalities of gait and mobility 06/12/2021 No Yes Inactive Problems Resolved Problems Electronic Signature(s) Signed: 08/20/2021 4:35:26 PM By: Linton Ham MD Entered By: Linton Ham on 08/20/2021 14:31:19 -------------------------------------------------------------------------------- Progress Note  Details Patient Name: Date of Service: CA Windy Kalata, Lorrayne 08/20/2021 1:30 PM Medical Record Number: [id number] Patient Account Number: [id number] Date of Birth/Sex: Treating RN: 06-14-1971 (51 y.o. [name] Primary Care Provider: Corena Pilgrim Other Clinician: Referring Provider: Treating Provider/Extender: Glynn Octave in Treatment: 28 Subjective History of Present Illness (HPI) ADMISSION 12/31/2020 This is a 51 year old woman with poorly controlled type 2 diabetes on insulin. Her problem began after a fall in late April. She developed a pimple area on her left buttock that rapidly expanded. She was admitted to hospital from 12/28/2020 through 01/19/2021 initially I believed to [facility] subsequently transferred to [facility] when she developed clear septic shock. 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 Her wound culture grew grew group B strep. She required operative debridements on 6/3 and 6/6 and subsequent bedside debridements as well. I have another culture result that showed E. coli. She was discharged on cephalexin and Augmentin she is long since finished this. She is now at home using Silvadene cream covered with ABDs. Past medical history includes new onset A. fib on Eliquis type 2 diabetes on insulin with a recent hemoglobin A1c of 11.1. As far as I can tell she is eating and drinking well she is taking Ensure at home. She has had significant weight loss dating back prior to this infection I am not exactly sure of the cause here 8/15; patient presents for follow-up. Its been 1 month since patient has last been seen. She is currently using Dakin's wet-to-dry dressings. She has no issues or complaints today. She denies signs of infection. 8/30; this is a patient have not seen in almost 2 months. She has a pressure ulcer on her left buttock and reasonably close proximity to her anal opening. Most of this appears fairly  superficial however most proximally there is an area with  more depth. She apparently has poorly controlled diabetes. She has been using Dakin's wet-to-dry for about 7 weeks now. She is changing this multiple times a day her son does the dressing. She says out of concern for urine but this does not look like an area that should be getting contaminated with urine. She claims not to be incontinent. She has a level 3 pressure relief surface I think by her description 9/13; we attempted to get this patient Medihoney to aid in surface debridement however she still has not gotten this to date and has been using wet-to-dry. 9/27; patient is using Medihoney gauze to the wound surface. They are apparently changing this 2-3 times a day due to difficulty keeping dressings in place. This is on the left buttock in close proximity to the gluteal cleft. The wound measures smaller and certainly has a much cleaner surface than I was expecting to see. 10/11; patient presents for follow-up. She has been using Medihoney daily to the wound surface. She has no issues or complaints today. She tries to aggressively offload the area. She denies signs of infection. 10/25; wound surface looks healthier today. I am going to try to use silver alginate. Medihoney is cleaned up the surface of the wound 11/1; this continues to contract at his steady albeit slow rate. Healthy looking granulation we have been using silver alginate we have given her pieces of this because of her status has Medicaid changing every 2 days. There is no evidence of concurrent infection. Initially this was because of a necrotizing soft tissue infection 11/16; wound continues to contract. We have been using silver alginate she is changing this every 2 days. She comes into clinic today asking me about right leg weakness when she is walking. Its not really clear to me when this began but it seems sometime either after she came home from the hospital in June or  thereafter. She has a walker. She does not complain of sensory loss. She tells me that she "was told she did not have a stroke" 12/7; not really much change in her wound surface area. Using silver alginate and she is changing this twice a day for reasons that I think are most to do with sanitation of the dressing. In any case there is not a lot of options here. I was hoping to change to [facility] at some point but we certainly could not supply enough Hydrofera Blue to change this twice a day. 1/24; the wound is split into 2 proximal and distal. The proximal 1 still has more depth there is healing between the 2. Using silver alginate they are changing it twice a day Objective Constitutional Sitting or standing Blood Pressure is within target range for patient.. Pulse regular and within target range for patient.Marland Kitchen Respirations regular, non-labored and within target range.. Temperature is normal and within the target range for the patient.Marland Kitchen Appears in no distress. Vitals Time Taken: 1:38 PM, Temperature: 98.3 F, Pulse: 78 bpm, Respiratory Rate: 17 breaths/min, Blood Pressure: 139/72 mmHg. General Notes: Wound exam; left gluteus near the gluteal cleft. Now down to 2 areas nicely granulated. The proximal area has more depth. There is no evidence of surrounding infection oo They had me look at an area on her right mid gluteus. A small nodule. Somewhat tender there is no obvious surrounding infection. Integumentary (Hair, Skin) Wound #1 status is Open. Original cause of wound was Other Lesion. The date acquired was: 12/23/2020. The wound has been in treatment 28 weeks.  The wound is located on the Left Gluteus. The wound measures 9cm length x 1.5cm width x 1cm depth; 10.603cm^2 area and 10.603cm^3 volume. There is Fat Layer (Subcutaneous Tissue) exposed. There is no tunneling or undermining noted. There is a medium amount of serosanguineous drainage noted. The wound margin is well defined and not  attached to the wound base. There is large (67-100%) red granulation within the wound bed. There is no necrotic tissue within the wound bed. Assessment Active Problems ICD-10 Disruption of external operation (surgical) wound, not elsewhere classified, subsequent encounter Unspecified open wound of left buttock, subsequent encounter Necrotizing fasciitis Type 2 diabetes mellitus with other skin ulcer Other abnormalities of gait and mobility Plan Follow-up Appointments: Return appointment in 1 month. - Dr. Dellia Nims Bathing/ Shower/ Hygiene: May shower and wash wound with soap and water. Off-Loading: Low air-loss mattress (Group 2) - continue to use. Turn and reposition every 2 hours - only up for meals and back to bed. Additional Orders / Instructions: Follow Nutritious Diet - increase protein, eat three meals a day. WOUND #1: - Gluteus Wound Laterality: Left Cleanser: Soap and Water 1 x Per Day/30 Days Discharge Instructions: May shower and wash wound with dial antibacterial soap and water prior to dressing change. Cleanser: Wound Cleanser 1 x Per Day/30 Days Discharge Instructions: Cleanse the wound with wound cleanser prior to applying a clean dressing using gauze sponges, not tissue or cotton balls. Peri-Wound Care: Skin Prep 1 x Per Day/30 Days Discharge Instructions: Use skin prep as directed Prim Dressing: KerraCel Ag Gelling Fiber Dressing, 4x5 in (silver alginate) 1 x Per Day/30 Days ary Discharge Instructions: Apply silver alginate to wound bed as instructed Secondary Dressing: Woven Gauze Sponge, Non-Sterile 4x4 in 1 x Per Day/30 Days Discharge Instructions: Apply over primary dressing as directed. Secondary Dressing: ABD Pad, 8x10 1 x Per Day/30 Days Discharge Instructions: Apply over primary dressing as directed. Secured With: 98M Medipore Public affairs consultant Surgical T 2x10 (in/yd) 1 x Per Day/30 Days ape Discharge Instructions: Secure with tape as directed. 1. We have continued  the silver alginate twice a day. I am not exactly sure why they are changing it twice a day however this seems to be working 2. I was not sure what they were pointing out on the right glutes. This did not obviously look like an abscess. It was not obviously erythematous. Somewhat tender to direct palpation. I suggested topical antibiotics and observation for now Electronic Signature(s) Signed: 08/20/2021 4:35:26 PM By: Linton Ham MD Entered By: Linton Ham on 08/20/2021 14:39:31 -------------------------------------------------------------------------------- SuperBill Details Patient Name: Date of Service: DENESHA, DENK 08/20/2021 Medical Record Number: [id number] Patient Account Number: [id number] Date of Birth/Sex: Treating RN: 1971-07-04 (51 y.o. [name] Primary Care Provider: Corena Pilgrim Other Clinician: Referring Provider: Treating Provider/Extender: Glynn Octave in Treatment: 28 Diagnosis Coding ICD-10 Codes Code Description T81.31XD Disruption of external operation (surgical) wound, not elsewhere classified, subsequent encounter S31.829D Unspecified open wound of left buttock, subsequent encounter M72.6 Necrotizing fasciitis E11.622 Type 2 diabetes mellitus with other skin ulcer R26.89 Other abnormalities of gait and mobility Facility Procedures CPT4 Code: [id number] Description: 99213 - WOUND CARE VISIT-LEV 3 EST PT Modifier: Quantity: 1 Physician Procedures : CPT4 Code Description Modifier [id number] - WC PHYS LEVEL 3 - EST PT ICD-10 Diagnosis Description T81.31XD Disruption of external operation (surgical) wound, not elsewhere classified, subsequent encounter S31.829D Unspecified open wound of left  buttock, subsequent encounter Quantity: 1 Electronic Signature(s) Signed: 08/20/2021 4:35:26 PM  By: Linton Ham MD Signed: 09/03/2021 8:14:39 AM By: Sharyn Creamer RN, BSN Entered By: Sharyn Creamer on 08/20/2021 15:54:32

## 2021-09-17 ENCOUNTER — Other Ambulatory Visit: Payer: Self-pay

## 2021-09-17 ENCOUNTER — Encounter (HOSPITAL_BASED_OUTPATIENT_CLINIC_OR_DEPARTMENT_OTHER): Payer: Medicaid Other | Attending: Internal Medicine | Admitting: General Surgery

## 2021-09-17 DIAGNOSIS — E1165 Type 2 diabetes mellitus with hyperglycemia: Secondary | ICD-10-CM | POA: Diagnosis not present

## 2021-09-17 DIAGNOSIS — W19XXXA Unspecified fall, initial encounter: Secondary | ICD-10-CM | POA: Diagnosis not present

## 2021-09-17 DIAGNOSIS — E11622 Type 2 diabetes mellitus with other skin ulcer: Secondary | ICD-10-CM | POA: Insufficient documentation

## 2021-09-17 DIAGNOSIS — F17210 Nicotine dependence, cigarettes, uncomplicated: Secondary | ICD-10-CM | POA: Insufficient documentation

## 2021-09-17 DIAGNOSIS — T8131XD Disruption of external operation (surgical) wound, not elsewhere classified, subsequent encounter: Secondary | ICD-10-CM | POA: Diagnosis not present

## 2021-09-17 DIAGNOSIS — Z794 Long term (current) use of insulin: Secondary | ICD-10-CM | POA: Insufficient documentation

## 2021-09-17 DIAGNOSIS — S31829A Unspecified open wound of left buttock, initial encounter: Secondary | ICD-10-CM | POA: Insufficient documentation

## 2021-09-17 DIAGNOSIS — R2689 Other abnormalities of gait and mobility: Secondary | ICD-10-CM | POA: Diagnosis not present

## 2021-09-17 DIAGNOSIS — Z7901 Long term (current) use of anticoagulants: Secondary | ICD-10-CM | POA: Diagnosis not present

## 2021-09-17 DIAGNOSIS — I4891 Unspecified atrial fibrillation: Secondary | ICD-10-CM | POA: Insufficient documentation

## 2021-09-17 DIAGNOSIS — M726 Necrotizing fasciitis: Secondary | ICD-10-CM | POA: Diagnosis not present

## 2021-09-17 NOTE — Progress Notes (Signed)
[name] [name] ([id number]) Visit Report for 09/17/2021 Chief Complaint Document Details Patient Name: Date of Service: [name] [name], PROKOP 09/17/2021 10:30 A M Medical Record Number: [id number] Patient Account Number: [id number] Date of Birth/Sex: Treating RN: September 26, 1970 (51 y.o. [name] [name] Primary Care Provider: Theophilus Kinds Other Clinician: Referring Provider: Treating Provider/Extender: Earl Many in Treatment: 32 Information Obtained from: Patient Chief Complaint 01/31/2021; patient is here for review of a very large surgical wound on the left buttock 09/17/21: patient is here for ongoing management of her surgical wound Electronic Signature(s) Signed: 09/17/2021 11:27:56 AM By: Duanne Guess MD, FACS Entered By: Duanne Guess on 09/17/2021 11:27:55 -------------------------------------------------------------------------------- Debridement Details Patient Name: Date of Service: CA [name] [name], [name] 09/17/2021 10:30 A M Medical Record Number: [id number] Patient Account Number: [id number] Date of Birth/Sex: Treating RN: Oct 04, 1970 (51 y.o. Billy Coast, Linda Primary Care Provider: Theophilus Kinds Other Clinician: Referring Provider: Treating Provider/Extender: Earl Many in Treatment: 32 Debridement Performed for Assessment: Wound #2 Left,Distal Gluteus Performed By: Physician Duanne Guess, MD Debridement Type: Debridement Level of Consciousness (Pre-procedure): Awake and Alert Pre-procedure Verification/Time Out Yes - 11:20 Taken: Start Time: 11:20 Pain Control: Lidocaine 4% T opical Solution T Area Debrided (L x W): otal 2.2 (cm) x 0.9 (cm) = 1.98 (cm) Tissue and other material debrided: Viable, Non-Viable, Slough, Subcutaneous, Skin: Epidermis, Slough Level: Skin/Subcutaneous Tissue Debridement Description: Excisional Instrument: Curette Bleeding: Minimum Hemostasis Achieved: Pressure Procedural Pain: 3 Post  Procedural Pain: 1 Response to Treatment: Procedure was tolerated well Level of Consciousness (Post- Awake and Alert procedure): Post Debridement Measurements of Total Wound Length: (cm) 2.2 Width: (cm) 0.9 Depth: (cm) 0.1 Volume: (cm) 0.156 Character of Wound/Ulcer Post Debridement: Improved Post Procedure Diagnosis Same as Pre-procedure Electronic Signature(s) Signed: 09/17/2021 11:41:38 AM By: Duanne Guess MD, FACS Signed: 09/17/2021 6:04:33 PM By: Zenaida Deed RN, BSN Entered By: Zenaida Deed on 09/17/2021 11:23:00 -------------------------------------------------------------------------------- Debridement Details Patient Name: Date of Service: CA [name] [name], [name] [name] 09/17/2021 10:30 A M Medical Record Number: [id number] Patient Account Number: [id number] Date of Birth/Sex: Treating RN: Jun 14, 1971 (51 y.o. Billy Coast, Linda Primary Care Provider: Theophilus Kinds Other Clinician: Referring Provider: Treating Provider/Extender: Earl Many in Treatment: 32 Debridement Performed for Assessment: Wound #1 Left Gluteus Performed By: Physician Duanne Guess, MD Debridement Type: Debridement Level of Consciousness (Pre-procedure): Awake and Alert Pre-procedure Verification/Time Out Yes - 11:20 Taken: Start Time: 11:20 Pain Control: Lidocaine 4% T opical Solution T Area Debrided (L x W): otal 2.6 (cm) x 0.8 (cm) = 2.08 (cm) Tissue and other material debrided: Viable, Non-Viable, Slough, Subcutaneous, Slough Level: Skin/Subcutaneous Tissue Debridement Description: Excisional Instrument: Curette Bleeding: Minimum Hemostasis Achieved: Pressure Procedural Pain: 3 Post Procedural Pain: 1 Response to Treatment: Procedure was tolerated well Level of Consciousness (Post- Awake and Alert procedure): Post Debridement Measurements of Total Wound Length: (cm) 2.6 Width: (cm) 0.8 Depth: (cm) 0.6 Volume: (cm) 0.98 Character of Wound/Ulcer Post  Debridement: Improved Post Procedure Diagnosis Same as Pre-procedure Electronic Signature(s) Signed: 09/17/2021 11:41:38 AM By: Duanne Guess MD, FACS Signed: 09/17/2021 6:04:33 PM By: Zenaida Deed RN, BSN Entered By: Zenaida Deed on 09/17/2021 11:23:41 -------------------------------------------------------------------------------- HPI Details Patient Name: Date of Service: CA [name] [name], [name] [name] 09/17/2021 10:30 A M Medical Record Number: [id number] Patient Account Number: [id number] Date of Birth/Sex: Treating RN: [birth year] (51 y.o. [name] [name] Primary Care Provider: Theophilus Kinds Other Clinician: Referring Provider: Treating Provider/Extender: Earl Many in Treatment: 32 History of Present Illness HPI Description: ADMISSION 12/31/2020 This  is a 51 year old woman with poorly controlled type 2 diabetes on insulin. Her problem began after a fall in late April. She developed a pimple area on her left buttock that rapidly expanded. She was admitted to hospital from 12/28/2020 through 01/19/2021 initially I believed to [facility] subsequently transferred to [facility] when she developed clear septic shock. 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 Her wound culture grew grew group B strep. She required operative debridements on 6/3 and 6/6 and subsequent bedside debridements as well. I have another culture result that showed E. coli. She was discharged on cephalexin and Augmentin she is long since finished this. She is now at home using Silvadene cream covered with ABDs. Past medical history includes new onset A. fib on Eliquis type 2 diabetes on insulin with a recent hemoglobin A1c of 11.1. As far as I can tell she is eating and drinking well she is taking Ensure at home. She has had significant weight loss dating back prior to this infection I am not exactly sure of the cause here 8/15; patient presents for follow-up.  Its been 1 month since patient has last been seen. She is currently using Dakin's wet-to-dry dressings. She has no issues or complaints today. She denies signs of infection. 8/30; this is a patient have not seen in almost 2 months. She has a pressure ulcer on her left buttock and reasonably close proximity to her anal opening. Most of this appears fairly superficial however most proximally there is an area with more depth. She apparently has poorly controlled diabetes. She has been using Dakin's wet-to-dry for about 7 weeks now. She is changing this multiple times a day her son does the dressing. She says out of concern for urine but this does not look like an area that should be getting contaminated with urine. She claims not to be incontinent. She has a level 3 pressure relief surface I think by her description 9/13; we attempted to get this patient Medihoney to aid in surface debridement however she still has not gotten this to date and has been using wet-to-dry. 9/27; patient is using Medihoney gauze to the wound surface. They are apparently changing this 2-3 times a day due to difficulty keeping dressings in place. This is on the left buttock in close proximity to the gluteal cleft. The wound measures smaller and certainly has a much cleaner surface than I was expecting to see. 10/11; patient presents for follow-up. She has been using Medihoney daily to the wound surface. She has no issues or complaints today. She tries to aggressively offload the area. She denies signs of infection. 10/25; wound surface looks healthier today. I am going to try to use silver alginate. Medihoney is cleaned up the surface of the wound 11/1; this continues to contract at his steady albeit slow rate. Healthy looking granulation we have been using silver alginate we have given her pieces of this because of her status has Medicaid changing every 2 days. There is no evidence of concurrent infection. Initially this was  because of a necrotizing soft tissue infection 11/16; wound continues to contract. We have been using silver alginate she is changing this every 2 days. She comes into clinic today asking me about right leg weakness when she is walking. Its not really clear to me when this began but it seems sometime either after she came home from the hospital in June or thereafter. She has a walker. She does not complain  of sensory loss. She tells me that she "was told she did not have a stroke" 12/7; not really much change in her wound surface area. Using silver alginate and she is changing this twice a day for reasons that I think are most to do with sanitation of the dressing. In any case there is not a lot of options here. I was hoping to change to [facility] at some point but we certainly could not supply enough Hydrofera Blue to change this twice a day. 1/24; the wound is split into 2 proximal and distal. The proximal 1 still has more depth there is healing between the 2. Using silver alginate they are changing it twice a day 09/17/2021: Both wounds continue to heal. The more cranial continues to be slightly deeper than the caudal. They continue to use silver alginate and are changing it once a day. Electronic Signature(s) Signed: 09/17/2021 11:29:16 AM By: Duanne Guess MD, FACS Entered By: Duanne Guess on 09/17/2021 11:29:16 -------------------------------------------------------------------------------- Physical Exam Details Patient Name: Date of Service: LAURIEANN, FRIDDLE 09/17/2021 10:30 A M Medical Record Number: [id number] Patient Account Number: [id number] Date of Birth/Sex: Treating RN: [birth year] (51 y.o. [name] [name] Primary Care Provider: Theophilus Kinds Other Clinician: Referring Provider: Treating Provider/Extender: Earl Many in Treatment: 32 Constitutional She is hypertensive. . . She is obese.Marland Kitchen Respiratory Normal work of breathing on room  air. Notes 09/17/2021: Wound exam: Left gluteus near the gluteal cleft. 2 areas, which continue to contract and heal. Good granulation tissue at the base of each wound with some overlying adherent slough. The proximal wound is deeper than the more caudal of the 2. There is a bit of periwound maceration at both sites. Electronic Signature(s) Signed: 09/17/2021 11:32:02 AM By: Duanne Guess MD, FACS Entered By: Duanne Guess on 09/17/2021 11:32:02 -------------------------------------------------------------------------------- Physician Orders Details Patient Name: Date of Service: FRANCELIA, [facility] 09/17/2021 10:30 A M Medical Record Number: [id number] Patient Account Number: [id number] Date of Birth/Sex: Treating RN: 1970/11/12 (51 y.o. [name] [name] Primary Care Provider: Theophilus Kinds Other Clinician: Referring Provider: Treating Provider/Extender: Earl Many in Treatment: [phone] Verbal / Phone Orders: No Diagnosis Coding ICD-10 Coding Code Description T81.31XD Disruption of external operation (surgical) wound, not elsewhere classified, subsequent encounter S31.829D Unspecified open wound of left buttock, subsequent encounter M72.6 Necrotizing fasciitis E11.622 Type 2 diabetes mellitus with other skin ulcer R26.89 Other abnormalities of gait and mobility Follow-up Appointments Return appointment in 1 month. - Dr. Lady Gary Bathing/ Shower/ Hygiene May shower and wash wound with soap and water. Off-Loading Low air-loss mattress (Group 2) - continue to use. Turn and reposition every 2 hours Additional Orders / Instructions Follow Nutritious Diet - increase protein, eat three meals a day. Wound Treatment Wound #1 - Gluteus Wound Laterality: Left Cleanser: Soap and Water 1 x Per Day/30 Days Discharge Instructions: May shower and wash wound with dial antibacterial soap and water prior to dressing change. Cleanser: Wound Cleanser 1 x Per Day/30  Days Discharge Instructions: Cleanse the wound with wound cleanser prior to applying a clean dressing using gauze sponges, not tissue or cotton balls. Peri-Wound Care: Skin Prep 1 x Per Day/30 Days Discharge Instructions: Use skin prep as directed Peri-Wound Care: Zinc Oxide Ointment 30g tube 1 x Per Day/30 Days Discharge Instructions: Apply Zinc Oxide to periwound with each dressing change Prim Dressing: KerraCel Ag Gelling Fiber Dressing, 4x5 in (silver alginate) 1 x Per Day/30 Days ary Discharge Instructions: Apply silver alginate to wound  bed as instructed Secondary Dressing: Woven Gauze Sponge, Non-Sterile 4x4 in 1 x Per Day/30 Days Discharge Instructions: Apply over primary dressing as directed. Secondary Dressing: ABD Pad, 8x10 1 x Per Day/30 Days Discharge Instructions: Apply over primary dressing as directed. Secured With: 52M Medipore Scientist, research (life sciences) Surgical T 2x10 (in/yd) 1 x Per Day/30 Days ape Discharge Instructions: Secure with tape as directed. Wound #2 - Gluteus Wound Laterality: Left, Distal Cleanser: Soap and Water 1 x Per Day/30 Days Discharge Instructions: May shower and wash wound with dial antibacterial soap and water prior to dressing change. Cleanser: Wound Cleanser 1 x Per Day/30 Days Discharge Instructions: Cleanse the wound with wound cleanser prior to applying a clean dressing using gauze sponges, not tissue or cotton balls. Peri-Wound Care: Skin Prep 1 x Per Day/30 Days Discharge Instructions: Use skin prep as directed Peri-Wound Care: Zinc Oxide Ointment 30g tube 1 x Per Day/30 Days Discharge Instructions: Apply Zinc Oxide to periwound with each dressing change Prim Dressing: KerraCel Ag Gelling Fiber Dressing, 4x5 in (silver alginate) 1 x Per Day/30 Days ary Discharge Instructions: Apply silver alginate to wound bed as instructed Secondary Dressing: Woven Gauze Sponge, Non-Sterile 4x4 in 1 x Per Day/30 Days Discharge Instructions: Apply over primary dressing as  directed. Secondary Dressing: ABD Pad, 8x10 1 x Per Day/30 Days Discharge Instructions: Apply over primary dressing as directed. Secured With: 52M Medipore Scientist, research (life sciences) Surgical T 2x10 (in/yd) 1 x Per Day/30 Days ape Discharge Instructions: Secure with tape as directed. Electronic Signature(s) Signed: 09/17/2021 11:41:38 AM By: Duanne Guess MD, FACS Signed: 09/17/2021 6:04:33 PM By: Zenaida Deed RN, BSN Previous Signature: 09/17/2021 11:32:23 AM Version By: Duanne Guess MD, FACS Entered By: Zenaida Deed on 09/17/2021 11:33:27 -------------------------------------------------------------------------------- Problem List Details Patient Name: Date of Service: LAIGHLA, MCMURDIE 09/17/2021 10:30 A M Medical Record Number: [id number] Patient Account Number: [id number] Date of Birth/Sex: Treating RN: [birth year] (50 y.o. [name] [name] Primary Care Provider: Theophilus Kinds Other Clinician: Referring Provider: Treating Provider/Extender: Earl Many in Treatment: 32 Active Problems ICD-10 Encounter Code Description Active Date MDM Diagnosis T81.31XD Disruption of external operation (surgical) wound, not elsewhere classified, 01/31/2021 No Yes subsequent encounter S31.829D Unspecified open wound of left buttock, subsequent encounter 01/31/2021 No Yes M72.6 Necrotizing fasciitis 01/31/2021 No Yes E11.622 Type 2 diabetes mellitus with other skin ulcer 01/31/2021 No Yes R26.89 Other abnormalities of gait and mobility 06/12/2021 No Yes Inactive Problems Resolved Problems Electronic Signature(s) Signed: 09/17/2021 11:26:22 AM By: Duanne Guess MD, FACS Entered By: Duanne Guess on 09/17/2021 11:26:22 -------------------------------------------------------------------------------- Progress Note Details Patient Name: Date of Service: CA [name] [name], Sam 09/17/2021 10:30 A M Medical Record Number: [id number] Patient Account Number: [id number] Date of Birth/Sex:  Treating RN: June 11, 1971 (51 y.o. [name] [name] Primary Care Provider: Theophilus Kinds Other Clinician: Referring Provider: Treating Provider/Extender: Earl Many in Treatment: 32 Subjective Chief Complaint Information obtained from Patient 01/31/2021; patient is here for review of a very large surgical wound on the left buttock 09/17/21: patient is here for ongoing management of her surgical wound History of Present Illness (HPI) ADMISSION 12/31/2020 This is a 51 year old woman with poorly controlled type 2 diabetes on insulin. Her problem began after a fall in late April. She developed a pimple area on her left buttock that rapidly expanded. She was admitted to hospital from 12/28/2020 through 01/19/2021 initially I believed to [facility] subsequently transferred to [facility] when she developed clear septic shock. She was diagnosed with a necrotizing soft tissue  infection of the left buttock. Her wound culture grew grew group B strep. She required operative debridements on 6/3 and 6/6 and subsequent bedside debridements as well. I have another culture result that showed E. coli. She was discharged on cephalexin and Augmentin she is long since finished this. She is now at home using Silvadene cream covered with ABDs. Past medical history includes new onset A. fib on Eliquis type 2 diabetes on insulin with a recent hemoglobin A1c of 11.1. As far as I can tell she is eating and drinking well she is taking Ensure at home. She has had significant weight loss dating back prior to this infection I am not exactly sure of the cause here 8/15; patient presents for follow-up. Its been 1 month since patient has last been seen. She is currently using Dakin's wet-to-dry dressings. She has no issues or complaints today. She denies signs of infection. 8/30; this is a patient have not seen in almost 2 months. She has a pressure ulcer on her left buttock and reasonably close proximity to  her anal opening. Most of this appears fairly superficial however most proximally there is an area with more depth. She apparently has poorly controlled diabetes. She has been using Dakin's wet-to-dry for about 7 weeks now. She is changing this multiple times a day her son does the dressing. She says out of concern for urine but this does not look like an area that should be getting contaminated with urine. She claims not to be incontinent. She has a level 3 pressure relief surface I think by her description 9/13; we attempted to get this patient Medihoney to aid in surface debridement however she still has not gotten this to date and has been using wet-to-dry. 9/27; patient is using Medihoney gauze to the wound surface. They are apparently changing this 2-3 times a day due to difficulty keeping dressings in place. This is on the left buttock in close proximity to the gluteal cleft. The wound measures smaller and certainly has a much cleaner surface than I was expecting to see. 10/11; patient presents for follow-up. She has been using Medihoney daily to the wound surface. She has no issues or complaints today. She tries to aggressively offload the area. She denies signs of infection. 10/25; wound surface looks healthier today. I am going to try to use silver alginate. Medihoney is cleaned up the surface of the wound 11/1; this continues to contract at his steady albeit slow rate. Healthy looking granulation we have been using silver alginate we have given her pieces of this because of her status has Medicaid changing every 2 days. There is no evidence of concurrent infection. Initially this was because of a necrotizing soft tissue infection 11/16; wound continues to contract. We have been using silver alginate she is changing this every 2 days. She comes into clinic today asking me about right leg weakness when she is walking. Its not really clear to me when this began but it seems sometime  either after she came home from the hospital in June or thereafter. She has a walker. She does not complain of sensory loss. She tells me that she "was told she did not have a stroke" 12/7; not really much change in her wound surface area. Using silver alginate and she is changing this twice a day for reasons that I think are most to do with sanitation of the dressing. In any case there is not a lot of options here. I was hoping to  change to Hydrofera Blue at some point but we certainly could not supply enough Hydrofera Blue to change this twice a day. 1/24; the wound is split into 2 proximal and distal. The proximal 1 still has more depth there is healing between the 2. Using silver alginate they are changing it twice a day 09/17/2021: Both wounds continue to heal. The more cranial continues to be slightly deeper than the caudal. They continue to use silver alginate and are changing it once a day. Patient History Information obtained from Patient. Family History Cancer - Mother, Diabetes - Mother,Father, Hypertension - Mother,Father, Lung Disease - Mother, No family history of Heart Disease, Hereditary Spherocytosis, Kidney Disease, Seizures, Stroke, Thyroid Problems, Tuberculosis. Social History Current every day smoker - 2-3 cigarettes a day, Marital Status - Widowed, Alcohol Use - Never, Drug Use - No History, Caffeine Use - Daily - coffee. Medical History Cardiovascular Patient has history of Arrhythmia, Congestive Heart Failure Endocrine Patient has history of Type II Diabetes Neurologic Patient has history of Neuropathy Medical A Surgical History Notes nd Genitourinary Acute Kidney Injury Objective Constitutional She is hypertensive. She is obese.. Vitals Time Taken: 11:02 AM, Temperature: 98.7 F, Pulse: 89 bpm, Respiratory Rate: 17 breaths/min, Blood Pressure: 177/94 mmHg. Respiratory Normal work of breathing on room air. General Notes: 09/17/2021: Wound exam: Left gluteus  near the gluteal cleft. 2 areas, which continue to contract and heal. Good granulation tissue at the base of each wound with some overlying adherent slough. The proximal wound is deeper than the more caudal of the 2. There is a bit of periwound maceration at both sites. Integumentary (Hair, Skin) Wound #1 status is Open. Original cause of wound was Other Lesion. The date acquired was: 12/23/2020. The wound has been in treatment 32 weeks. The wound is located on the Left Gluteus. The wound measures 2.6cm length x 0.8cm width x 0.6cm depth; 1.634cm^2 area and 0.98cm^3 volume. There is Fat Layer (Subcutaneous Tissue) exposed. There is no tunneling or undermining noted. There is a medium amount of serosanguineous drainage noted. The wound margin is well defined and not attached to the wound base. There is large (67-100%) red granulation within the wound bed. There is no necrotic tissue within the wound bed. Wound #2 status is Open. Original cause of wound was Gradually Appeared. The date acquired was: 09/17/2021. The wound is located on the Left,Distal Gluteus. The wound measures 2.2cm length x 0.9cm width x 0.2cm depth; 1.555cm^2 area and 0.311cm^3 volume. There is Fat Layer (Subcutaneous Tissue) exposed. There is no tunneling noted, however, there is undermining starting at 5:00 and ending at 7:00 with a maximum distance of 0.2cm. There is a medium amount of serosanguineous drainage noted. The wound margin is flat and intact. There is large (67-100%) red granulation within the wound bed. There is no necrotic tissue within the wound bed. Assessment Active Problems ICD-10 Disruption of external operation (surgical) wound, not elsewhere classified, subsequent encounter Unspecified open wound of left buttock, subsequent encounter Necrotizing fasciitis Type 2 diabetes mellitus with other skin ulcer Other abnormalities of gait and mobility 09/17/2021: The wound has healed across the mid portion and is now  2 smaller wounds. These do not demonstrate any evidence of infection, however there is a bit of maceration surrounding each. Procedures Wound #1 Pre-procedure diagnosis of Wound #1 is an Infection - not elsewhere classified located on the Left Gluteus . There was a Excisional Skin/Subcutaneous Tissue Debridement with a total area of 2.08 sq cm performed by Lady Gary,  Victorino DikeJennifer, MD. With the following instrument(s): Curette to remove Viable and Non-Viable tissue/material. Material removed includes Subcutaneous Tissue and Slough and after achieving pain control using Lidocaine 4% T opical Solution. No specimens were taken. A time out was conducted at 11:20, prior to the start of the procedure. A Minimum amount of bleeding was controlled with Pressure. The procedure was tolerated well with a pain level of 3 throughout and a pain level of 1 following the procedure. Post Debridement Measurements: 2.6cm length x 0.8cm width x 0.6cm depth; 0.98cm^3 volume. Character of Wound/Ulcer Post Debridement is improved. Post procedure Diagnosis Wound #1: Same as Pre-Procedure Wound #2 Pre-procedure diagnosis of Wound #2 is a T be determined located on the Left,Distal Gluteus . There was a Excisional Skin/Subcutaneous Tissue Debridement o with a total area of 1.98 sq cm performed by Duanne Guessannon, Kirin Brandenburger, MD. With the following instrument(s): Curette to remove Viable and Non-Viable tissue/material. Material removed includes Subcutaneous Tissue, Slough, and Skin: Epidermis after achieving pain control using Lidocaine 4% T opical Solution. No specimens were taken. A time out was conducted at 11:20, prior to the start of the procedure. A Minimum amount of bleeding was controlled with Pressure. The procedure was tolerated well with a pain level of 3 throughout and a pain level of 1 following the procedure. Post Debridement Measurements: 2.2cm length x 0.9cm width x 0.1cm depth; 0.156cm^3 volume. Character of Wound/Ulcer Post  Debridement is improved. Post procedure Diagnosis Wound #2: Same as Pre-Procedure Plan 09/17/2021: After debriding adherent slough, there is good granulation tissue and evidence of new epithelialization visible on both wounds. We will continue with daily silver alginate dressings to both sites. Due to some periwound maceration, we will have them apply a topical barrier cream and cover the area with a dry dressing. She has been coming on a once a month basis and I think it is reasonable to continue this. Electronic Signature(s) Signed: 09/17/2021 11:35:33 AM By: Duanne Guessannon, Ramin Zoll MD, FACS Entered By: Duanne Guessannon, Shakeeta Godette on 09/17/2021 11:35:32 -------------------------------------------------------------------------------- HxROS Details Patient Name: Date of Service: CA [name] FailSSIDY, Darlys 09/17/2021 10:30 A M Medical Record Number: [id number] Patient Account Number: [id number] Date of Birth/Sex: Treating RN: April 21, 1971 (51 y.o. [name] StandardF) Boehlein, Linda Primary Care Provider: Theophilus KindsMurphy, John Other Clinician: Referring Provider: Treating Provider/Extender: Earl Manyannon, Sy Saintjean Murphy, John Weeks in Treatment: 32 Information Obtained From Patient Cardiovascular Medical History: Positive for: Arrhythmia; Congestive Heart Failure Endocrine Medical History: Positive for: Type II Diabetes Time with diabetes: 16 years Treated with: Insulin, Oral agents Blood sugar tested every day: Yes Tested : AC and HS Genitourinary Medical History: Past Medical History Notes: Acute Kidney Injury Neurologic Medical History: Positive for: Neuropathy Immunizations Pneumococcal Vaccine: Received Pneumococcal Vaccination: No Implantable Devices None Family and Social History Cancer: Yes - Mother; Diabetes: Yes - Mother,Father; Heart Disease: No; Hereditary Spherocytosis: No; Hypertension: Yes - Mother,Father; Kidney Disease: No; Lung Disease: Yes - Mother; Seizures: No; Stroke: No; Thyroid Problems: No; Tuberculosis: No;  Current every day smoker - 2-3 cigarettes a day; Marital Status - Widowed; Alcohol Use: Never; Drug Use: No History; Caffeine Use: Daily - coffee; Financial Concerns: No; Food, Clothing or Shelter Needs: No; Support System Lacking: No; Transportation Concerns: No Physician Affirmation I have reviewed and agree with the above information. Electronic Signature(s) Signed: 09/17/2021 11:41:38 AM By: Duanne Guessannon, Salma Walrond MD, FACS Signed: 09/17/2021 6:04:33 PM By: Zenaida DeedBoehlein, Linda RN, BSN Entered By: Duanne Guessannon, Laurel Harnden on 09/17/2021 11:29:45 -------------------------------------------------------------------------------- SuperBill Details Patient Name: Date of Service: Elayne GuerinCA SSIDY, Celestia 09/17/2021 Medical Record Number: [id number] Patient  Account Number: [id number] Date of Birth/Sex: Treating RN: Apr 01, 1971 (51 y.o. [name] [name] Primary Care Provider: Theophilus Kinds Other Clinician: Referring Provider: Treating Provider/Extender: Earl Many in Treatment: 32 Diagnosis Coding ICD-10 Codes Code Description T81.31XD Disruption of external operation (surgical) wound, not elsewhere classified, subsequent encounter S31.829D Unspecified open wound of left buttock, subsequent encounter M72.6 Necrotizing fasciitis E11.622 Type 2 diabetes mellitus with other skin ulcer R26.89 Other abnormalities of gait and mobility Facility Procedures CPT4 Code: 26834196 Description: 11042 - DEB SUBQ TISSUE 20 SQ CM/< ICD-10 Diagnosis Description S31.829D Unspecified open wound of left buttock, subsequent encounter Modifier: Quantity: 1 Physician Procedures : CPT4 Code Description Modifier [id number] 11042 - WC PHYS SUBQ TISS 20 SQ CM ICD-10 Diagnosis Description S31.829D Unspecified open wound of left buttock, subsequent encounter Quantity: 1 Electronic Signature(s) Signed: 09/17/2021 11:35:54 AM By: Duanne Guess MD, FACS Entered By: Duanne Guess on 09/17/2021 11:35:54

## 2021-09-18 NOTE — Progress Notes (Signed)
JASMIN, WINBERRY ([id number]) Visit Report for 09/17/2021 Arrival Information Details Patient Name: Date of Service: NOELE, ICENHOUR 09/17/2021 10:30 A M Medical Record Number: [id number] Patient Account Number: [id number] Date of Birth/Sex: Treating RN: August 07, 1970 (51 y.o. Elam Dutch Primary Care : Corena Pilgrim Other Clinician: Referring : Treating /Extender: Rexanne Mano in Treatment: 22 Visit Information History Since Last Visit Added or deleted any medications: No Patient Arrived: Gilford Rile Any new allergies or adverse reactions: No Arrival Time: 11:01 Had a fall or experienced change in No Accompanied By: son activities of daily living that may affect Transfer Assistance: None risk of falls: Patient Identification Verified: Yes Signs or symptoms of abuse/neglect since last visito No Secondary Verification Process Completed: Yes Hospitalized since last visit: No Patient Requires Transmission-Based Precautions: No Implantable device outside of the clinic excluding No Patient Has Alerts: Yes cellular tissue based products placed in the center Patient Alerts: Patient on Blood Thinner since last visit: Has Dressing in Place as Prescribed: Yes Pain Present Now: Yes Electronic Signature(s) Signed: 09/18/2021 8:22:14 AM By: Sandre Kitty Entered By: Sandre Kitty on 09/17/2021 11:02:12 -------------------------------------------------------------------------------- Encounter Discharge Information Details Patient Name: Date of Service: CA Windy Kalata, Paisyn 09/17/2021 10:30 A M Medical Record Number: [id number] Patient Account Number: [id number] Date of Birth/Sex: Treating RN: 1970/08/11 (51 y.o. Elam Dutch Primary Care : Corena Pilgrim Other Clinician: Referring : Treating /Extender: Rexanne Mano in Treatment: 32 Encounter Discharge Information Items Post Procedure  Vitals Discharge Condition: Stable Temperature (F): 98.7 Ambulatory Status: Walker Pulse (bpm): 89 Discharge Destination: Home Respiratory Rate (breaths/min): 18 Transportation: Private Auto Blood Pressure (mmHg): 177/94 Accompanied By: son Schedule Follow-up Appointment: Yes Clinical Summary of Care: Patient Declined Electronic Signature(s) Signed: 09/17/2021 6:04:33 PM By: Baruch Gouty RN, BSN Entered By: Baruch Gouty on 09/17/2021 11:45:16 -------------------------------------------------------------------------------- Lower Extremity Assessment Details Patient Name: Date of Service: JIREH, VINAS 09/17/2021 10:30 A M Medical Record Number: [id number] Patient Account Number: [id number] Date of Birth/Sex: Treating RN: 1970-10-30 (50 y.o. Elam Dutch Primary Care : Corena Pilgrim Other Clinician: Referring : Treating /Extender: Rexanne Mano in Treatment: 32 Electronic Signature(s) Signed: 09/17/2021 6:04:33 PM By: Baruch Gouty RN, BSN Entered By: Baruch Gouty on 09/17/2021 11:10:34 -------------------------------------------------------------------------------- Multi Wound Chart Details Patient Name: Date of Service: EMMALINE, WAHBA 09/17/2021 10:30 A M Medical Record Number: [id number] Patient Account Number: [id number] Date of Birth/Sex: Treating RN: 1970-08-13 (51 y.o. Elam Dutch Primary Care : Corena Pilgrim Other Clinician: Referring : Treating /Extender: Rexanne Mano in Treatment: 32 Vital Signs Height(in): Pulse(bpm): 62 Weight(lbs): Blood Pressure(mmHg): 177/94 Body Mass Index(BMI): Temperature(F): 98.7 Respiratory Rate(breaths/min): 17 Photos: [N/A:N/A] Left Gluteus Left, Distal Gluteus N/A Wound Location: Other Lesion Gradually Appeared N/A Wounding Event: Infection - not elsewhere classified T be determined o N/A Primary  Etiology: Arrhythmia, Congestive Heart Failure, Arrhythmia, Congestive Heart Failure, N/A Comorbid History: Type II Diabetes, Neuropathy Type II Diabetes, Neuropathy 12/23/2020 09/17/2021 N/A Date Acquired: 32 0 N/A Weeks of Treatment: Open Open N/A Wound Status: No No N/A Wound Recurrence: Yes No N/A Clustered Wound: 2 N/A N/A Clustered Quantity: 2.6x0.8x0.6 2.2x0.9x0.2 N/A Measurements L x W x D (cm) 1.634 1.555 N/A A (cm) : rea 0.98 0.311 N/A Volume (cm) : 98.40% 0.00% N/A % Reduction in A rea: 99.70% 0.00% N/A % Reduction in Volume: 5 Starting Position 1 (o'clock): 7 Ending Position 1 (o'clock): 0.2 Maximum Distance 1 (cm): No Yes N/A Undermining: Full Thickness Without Exposed Partial  Thickness N/A Classification: Support Structures Medium Medium N/A Exudate Amount: Serosanguineous Serosanguineous N/A Exudate Type: red, brown red, brown N/A Exudate Color: Well defined, not attached Flat and Intact N/A Wound Margin: Large (67-100%) Large (67-100%) N/A Granulation Amount: Red Red N/A Granulation Quality: None Present (0%) None Present (0%) N/A Necrotic Amount: Fat Layer (Subcutaneous Tissue): Yes Fat Layer (Subcutaneous Tissue): Yes N/A Exposed Structures: Fascia: No Fascia: No Tendon: No Tendon: No Muscle: No Muscle: No Joint: No Joint: No Bone: No Bone: No Small (1-33%) Small (1-33%) N/A Epithelialization: Debridement - Excisional Debridement - Excisional N/A Debridement: Pre-procedure Verification/Time Out 11:20 11:20 N/A Taken: Lidocaine 4% Topical Solution Lidocaine 4% Topical Solution N/A Pain Control: Subcutaneous, Slough Subcutaneous, Slough N/A Tissue Debrided: Skin/Subcutaneous Tissue Skin/Subcutaneous Tissue N/A Level: 2.08 1.98 N/A Debridement A (sq cm): rea Curette Curette N/A Instrument: Minimum Minimum N/A Bleeding: Pressure Pressure N/A Hemostasis A chieved: 3 3 N/A Procedural Pain: 1 1 N/A Post Procedural  Pain: Procedure was tolerated well Procedure was tolerated well N/A Debridement Treatment Response: 2.6x0.8x0.6 2.2x0.9x0.1 N/A Post Debridement Measurements L x W x D (cm) 0.98 0.156 N/A Post Debridement Volume: (cm) Debridement Debridement N/A Procedures Performed: Treatment Notes Electronic Signature(s) Signed: 09/17/2021 11:27:00 AM By: Fredirick Maudlin MD, FACS Signed: 09/17/2021 6:04:33 PM By: Baruch Gouty RN, BSN Entered By: Fredirick Maudlin on 09/17/2021 11:26:59 -------------------------------------------------------------------------------- Multi-Disciplinary Care Plan Details Patient Name: Date of Service: MICAILA, ZIEMBA 09/17/2021 10:30 A M Medical Record Number: [id number] Patient Account Number: [id number] Date of Birth/Sex: Treating RN: May 26, 1971 (51 y.o. Elam Dutch Primary Care Alanii Ramer: Corena Pilgrim Other Clinician: Referring Huong Luthi: Treating Dijon Cosens/Extender: Rexanne Mano in Treatment: 29 Multidisciplinary Care Plan reviewed with physician Active Inactive Wound/Skin Impairment Nursing Diagnoses: Knowledge deficit related to ulceration/compromised skin integrity Goals: Patient/caregiver will verbalize understanding of skin care regimen Date Initiated: 01/31/2021 Target Resolution Date: 10/15/2021 Goal Status: Active Ulcer/skin breakdown will have a volume reduction of 50% by week 8 Date Initiated: 03/11/2021 Date Inactivated: 04/23/2021 Target Resolution Date: 04/25/2021 Goal Status: Met Ulcer/skin breakdown will have a volume reduction of 80% by week 12 Date Initiated: 04/23/2021 Date Inactivated: 05/07/2021 Target Resolution Date: 05/21/2021 Unmet Reason: see wound chart Goal Status: Unmet volume 70%. Interventions: Assess patient/caregiver ability to obtain necessary supplies Assess patient/caregiver ability to perform ulcer/skin care regimen upon admission and as needed Provide education on ulcer and skin  care Treatment Activities: Skin care regimen initiated : 01/31/2021 Topical wound management initiated : 01/31/2021 Notes: Electronic Signature(s) Signed: 09/17/2021 6:04:33 PM By: Baruch Gouty RN, BSN Entered By: Baruch Gouty on 09/17/2021 11:15:53 -------------------------------------------------------------------------------- Pain Assessment Details Patient Name: Date of Service: JATZIRI, GOFFREDO 09/17/2021 10:30 A M Medical Record Number: [id number] Patient Account Number: [id number] Date of Birth/Sex: Treating RN: 12-20-1970 (51 y.o. Elam Dutch Primary Care Audreena Sachdeva: Corena Pilgrim Other Clinician: Referring Mirna Sutcliffe: Treating Blayden Conwell/Extender: Rexanne Mano in Treatment: 32 Active Problems Location of Pain Severity and Description of Pain Patient Has Paino Yes Site Locations Rate the pain. Current Pain Level: 6 Pain Management and Medication Current Pain Management: Electronic Signature(s) Signed: 09/17/2021 6:04:33 PM By: Baruch Gouty RN, BSN Signed: 09/18/2021 8:22:14 AM By: Sandre Kitty Entered By: Sandre Kitty on 09/17/2021 11:02:39 -------------------------------------------------------------------------------- Patient/Caregiver Education Details Patient Name: Date of Service: [place] Windy Kalata, Gear[place] 2/21/2023andnbsp10:30 [place] Record Number: [id number] Patient Account Number: [id number] Date of Birth/Gender: Treating RN: 15-Oct-1970 (51 y.o. Elam Dutch Primary Care Physician: Corena Pilgrim Other Clinician: Referring Physician: Treating Physician/Extender: Serena Colonel  Weeks in Treatment: 32 Education Assessment Education Provided To: Patient Education Topics Provided Wound/Skin Impairment: Methods: Explain/Verbal Responses: Reinforcements needed, State content correctly [employer]) Signed: 09/17/2021 6:04:33 PM By: Baruch Gouty RN, BSN Entered By: Baruch Gouty on  09/17/2021 11:16:15 -------------------------------------------------------------------------------- Wound Assessment Details Patient Name: Date of Service: KENZLEY, KE 09/17/2021 10:30 A M Medical Record Number: [id number] Patient Account Number: [id number] Date of Birth/Sex: Treating RN: 1970/08/22 (51 y.o. Martyn Malay, Linda Primary Care : Corena Pilgrim Other Clinician: Referring : Treating /Extender: Rexanne Mano in Treatment: 32 Wound Status Wound Number: 1 Primary Infection - not elsewhere classified Etiology: Wound Location: Left Gluteus Wound Status: Open Wounding Event: Other Lesion Comorbid Arrhythmia, Congestive Heart Failure, Type II Diabetes, Date Acquired: 12/23/2020 History: Neuropathy Weeks Of Treatment: 32 Clustered Wound: Yes Photos Wound Measurements Length: (cm) 2.6 Width: (cm) 0.8 Depth: (cm) 0.6 Clustered Quantity: 2 Area: (cm) 1.634 Volume: (cm) 0.98 % Reduction in Area: 98.4% % Reduction in Volume: 99.7% Epithelialization: Small (1-33%) Tunneling: No Undermining: No Wound Description Classification: Full Thickness Without Exposed Support Structures Wound Margin: Well defined, not attached Exudate Amount: Medium Exudate Type: Serosanguineous Exudate Color: red, brown Foul Odor After Cleansing: No Slough/Fibrino No Wound Bed Granulation Amount: Large (67-100%) Exposed Structure Granulation Quality: Red Fascia Exposed: No Necrotic Amount: None Present (0%) Fat Layer (Subcutaneous Tissue) Exposed: Yes Tendon Exposed: No Muscle Exposed: No Joint Exposed: No Bone Exposed: No Treatment Notes Wound #1 (Gluteus) Wound Laterality: Left Cleanser Soap and Water Discharge Instruction: May shower and wash wound with dial antibacterial soap and water prior to dressing change. Wound Cleanser Discharge Instruction: Cleanse the wound with wound cleanser prior to applying a clean dressing using  gauze sponges, not tissue or cotton balls. Peri-Wound Care Skin Prep Discharge Instruction: Use skin prep as directed Zinc Oxide Ointment 30g tube Discharge Instruction: Apply Zinc Oxide to periwound with each dressing change Topical Primary Dressing KerraCel Ag Gelling Fiber Dressing, 4x5 in (silver alginate) Discharge Instruction: Apply silver alginate to wound bed as instructed Secondary Dressing Woven Gauze Sponge, Non-Sterile 4x4 in Discharge Instruction: Apply over primary dressing as directed. ABD Pad, 8x10 Discharge Instruction: Apply over primary dressing as directed. Secured With [employer] Surgical T 2x10 (in/yd) ape Discharge Instruction: Secure with tape as directed. Compression Wrap Compression Stockings Add-Ons Electronic Signature(s) Signed: 09/17/2021 6:04:33 PM By: Baruch Gouty RN, BSN Entered By: Baruch Gouty on 09/17/2021 11:13:24 -------------------------------------------------------------------------------- Wound Assessment Details Patient Name: Date of Service: TERITA, HEJL 09/17/2021 10:30 A M Medical Record Number: [id number] Patient Account Number: [id number] Date of Birth/Sex: Treating RN: Mar 08, 1971 (51 y.o. Martyn Malay, [place] Primary Care : Corena Pilgrim Other Clinician: Referring : Treating /Extender: Rexanne Mano in Treatment: 32 Wound Status Wound Number: 2 Primary T be determined o Etiology: Wound Location: Left, Distal Gluteus Wound Status: Open Wounding Event: Gradually Appeared Comorbid Arrhythmia, Congestive Heart Failure, Type II Diabetes, Date Acquired: 09/17/2021 History: Neuropathy Weeks Of Treatment: 0 Clustered Wound: No Photos Wound Measurements Length: (cm) 2.2 Width: (cm) 0.9 Depth: (cm) 0.2 Area: (cm) 1.555 Volume: (cm) 0.311 % Reduction in Area: 0% % Reduction in Volume: 0% Epithelialization: Small (1-33%) Tunneling: No Undermining:  Yes Starting Position (o'clock): 5 Ending Position (o'clock): 7 Maximum Distance: (cm) 0.2 Wound Description Classification: Partial Thickness Wound Margin: Flat and Intact Exudate Amount: Medium Exudate Type: Serosanguineous Exudate Color: red, brown Foul Odor After Cleansing: No Slough/Fibrino No Wound Bed Granulation Amount: Large (67-100%) Exposed Structure Granulation Quality: Red  Fascia Exposed: No Necrotic Amount: None Present (0%) Fat Layer (Subcutaneous Tissue) Exposed: Yes Tendon Exposed: No Muscle Exposed: No Joint Exposed: No Bone Exposed: No Treatment Notes Wound #2 (Gluteus) Wound Laterality: Left, Distal Cleanser Soap and Water Discharge Instruction: May shower and wash wound with dial antibacterial soap and water prior to dressing change. Wound Cleanser Discharge Instruction: Cleanse the wound with wound cleanser prior to applying a clean dressing using gauze sponges, not tissue or cotton balls. Peri-Wound Care Skin Prep Discharge Instruction: Use skin prep as directed Zinc Oxide Ointment 30g tube Discharge Instruction: Apply Zinc Oxide to periwound with each dressing change Topical Primary Dressing KerraCel Ag Gelling Fiber Dressing, 4x5 in (silver alginate) Discharge Instruction: Apply silver alginate to wound bed as instructed Secondary Dressing Woven Gauze Sponge, Non-Sterile 4x4 in Discharge Instruction: Apply over primary dressing as directed. ABD Pad, 8x10 Discharge Instruction: Apply over primary dressing as directed. Secured With [employer] Surgical T 2x10 (in/yd) ape Discharge Instruction: Secure with tape as directed. Compression Wrap Compression Stockings Add-Ons Electronic Signature(s) Signed: 09/17/2021 6:04:33 PM By: Baruch Gouty RN, BSN Entered By: Baruch Gouty on 09/17/2021 11:14:49 -------------------------------------------------------------------------------- Vitals Details Patient Name: Date of Service: [place]  Windy Kalata, [place] 09/17/2021 10:30 A M Medical Record Number: [id number] Patient Account Number: [id number] Date of Birth/Sex: Treating RN: 23-Nov-1970 (51 y.o. Elam Dutch Primary Care : Corena Pilgrim Other Clinician: Referring : Treating /Extender: Rexanne Mano in Treatment: 32 Vital Signs Time Taken: 11:02 Temperature (F): 98.7 Pulse (bpm): 89 Respiratory Rate (breaths/min): 17 Blood Pressure (mmHg): 177/94 Reference Range: 80 - 120 mg / dl Electronic Signature(s) Signed: 09/18/2021 8:22:14 AM By: Sandre Kitty Entered By: Sandre Kitty on 09/17/2021 11:02:28

## 2021-10-15 ENCOUNTER — Encounter (HOSPITAL_BASED_OUTPATIENT_CLINIC_OR_DEPARTMENT_OTHER): Payer: Medicaid Other | Attending: General Surgery | Admitting: General Surgery

## 2021-10-29 ENCOUNTER — Encounter (HOSPITAL_BASED_OUTPATIENT_CLINIC_OR_DEPARTMENT_OTHER): Payer: Medicaid Other | Attending: General Surgery | Admitting: General Surgery

## 2021-11-13 ENCOUNTER — Encounter (HOSPITAL_BASED_OUTPATIENT_CLINIC_OR_DEPARTMENT_OTHER): Payer: Medicaid Other | Admitting: General Surgery

## 2021-11-28 ENCOUNTER — Encounter (HOSPITAL_BASED_OUTPATIENT_CLINIC_OR_DEPARTMENT_OTHER): Payer: Medicaid Other | Attending: General Surgery | Admitting: General Surgery

## 2021-11-28 DIAGNOSIS — Z794 Long term (current) use of insulin: Secondary | ICD-10-CM | POA: Insufficient documentation

## 2021-11-28 DIAGNOSIS — E11622 Type 2 diabetes mellitus with other skin ulcer: Secondary | ICD-10-CM | POA: Diagnosis not present

## 2021-11-28 DIAGNOSIS — I4891 Unspecified atrial fibrillation: Secondary | ICD-10-CM | POA: Diagnosis not present

## 2021-11-28 DIAGNOSIS — Z7901 Long term (current) use of anticoagulants: Secondary | ICD-10-CM | POA: Diagnosis not present

## 2021-11-28 DIAGNOSIS — E1165 Type 2 diabetes mellitus with hyperglycemia: Secondary | ICD-10-CM | POA: Diagnosis not present

## 2021-11-28 DIAGNOSIS — L89329 Pressure ulcer of left buttock, unspecified stage: Secondary | ICD-10-CM | POA: Insufficient documentation

## 2021-11-28 NOTE — Progress Notes (Signed)
[name], [name] ([id number]) ?Visit Report for 11/28/2021 ?Abuse Risk Screen Details ?Patient Name: Date of Service: ?[name] [name], [name] [name] 11/28/2021 1:30 PM ?Medical Record Number: [id number] ?Patient Account Number: [id number] ?Date of Birth/Sex: Treating RN: ?November 20, 1970 (51 y.o. Female) Zandra Abts ?Primary Care Daniyal Tabor: Theophilus Kinds Other Clinician: ?Referring Julieann Drummonds: ?Treating Gola Bribiesca/Extender: Duanne Guess ?Weeks in Treatment: 0 ?Abuse Risk Screen Items ?Answer ?ABUSE RISK SCREEN: ?Has anyone close to you tried to hurt or harm you recentlyo No ?Do you feel uncomfortable with anyone in your familyo No ?Has anyone forced you do things that you didnt want to doo No ?Electronic Signature(s) ?Signed: 11/28/2021 6:01:57 PM By: Zandra Abts RN, BSN ?Entered By: Zandra Abts on 11/28/2021 14:04:41 ?-------------------------------------------------------------------------------- ?Activities of Daily Living Details ?Patient Name: Date of Service: ?[name] [name], [name] [name] 11/28/2021 1:30 PM ?Medical Record Number: [id number] ?Patient Account Number: [id number] ?Date of Birth/Sex: Treating RN: ?1970-09-21 (50 y.o. Female) Zandra Abts ?Primary Care Sneijder Bernards: Theophilus Kinds Other Clinician: ?Referring Izzabelle Bouley: ?Treating Graceland Wachter/Extender: Duanne Guess ?Weeks in Treatment: 0 ?Activities of Daily Living Items ?Answer ?Activities of Daily Living (Please select one for each item) ?Drive Automobile Completely Able ?T Medications ?ake Completely Able ?Use T elephone Completely Able ?Care for Appearance Completely Able ?Use T oilet Completely Able ?Bath / Shower Completely Able ?Dress Self Completely Able ?Feed Self Completely Able ?Walk Completely Able ?Get In / Out Bed Completely Able ?Housework Completely Able ?Prepare Meals Completely Able ?Handle Money Completely Able ?Shop for Self Completely Able ?Electronic Signature(s) ?Signed: 11/28/2021 6:01:57 PM By: Zandra Abts RN, BSN ?Entered By: Zandra Abts on 11/28/2021  14:04:58 ?-------------------------------------------------------------------------------- ?Education Screening Details ?Patient Name: ?Date of Service: ?[name] [name], [name] [name] 11/28/2021 1:30 PM ?Medical Record Number: [id number] ?Patient Account Number: [id number] ?Date of Birth/Sex: ?Treating RN: ?October 18, 1970 (51 y.o. Female) Zandra Abts ?Primary Care Cecilio Ohlrich: Theophilus Kinds ?Other Clinician: ?Referring Khole Branch: ?Treating Darl Brisbin/Extender: Duanne Guess ?Weeks in Treatment: 0 ?Primary Learner Assessed: Patient ?Learning Preferences/Education Level/Primary Language ?Learning Preference: Explanation, Demonstration, Printed Material ?Highest Education Level: High School ?Preferred Language: English ?Cognitive Barrier ?Language Barrier: No ?Translator Needed: No ?Memory Deficit: No ?Emotional Barrier: No ?Cultural/Religious Beliefs Affecting Medical Care: No ?Physical Barrier ?Impaired Vision: No ?Impaired Hearing: No ?Decreased Hand dexterity: No ?Knowledge/Comprehension ?Knowledge Level: High ?Comprehension Level: High ?Ability to understand written instructions: High ?Ability to understand verbal instructions: High ?Motivation ?Anxiety Level: Calm ?Cooperation: Cooperative ?Education Importance: Acknowledges Need ?Interest in Health Problems: Asks Questions ?Perception: Coherent ?Willingness to Engage in Self-Management High ?Activities: ?Readiness to Engage in Self-Management High ?Activities: ?Electronic Signature(s) ?Signed: 11/28/2021 6:01:57 PM By: Zandra Abts RN, BSN ?Entered By: Zandra Abts on 11/28/2021 14:05:47 ?-------------------------------------------------------------------------------- ?Fall Risk Assessment Details ?Patient Name: ?Date of Service: ?[name] [name], [name] [name] 11/28/2021 1:30 PM ?Medical Record Number: [id number] ?Patient Account Number: [id number] ?Date of Birth/Sex: ?Treating RN: ?1971/05/22 (51 y.o. Female) Zandra Abts ?Primary Care Pennelope Basque: Theophilus Kinds ?Other Clinician: ?Referring  Davon Abdelaziz: ?Treating Eilan Mcinerny/Extender: Duanne Guess ?Weeks in Treatment: 0 ?Fall Risk Assessment Items ?Have you had 2 or more falls in the last 12 monthso 0 No ?Have you had any fall that resulted in injury in the last 12 monthso 0 Yes ?FALLS RISK SCREEN ?History of falling - immediate or within 3 months 0 No ?Secondary diagnosis (Do you have 2 or more medical diagnoseso) 15 Yes ?Ambulatory aid ?None/bed rest/wheelchair/nurse 0 Yes ?Crutches/cane/walker 0 No ?Furniture 0 No ?Intravenous therapy Access/Saline/Heparin Lock 0 No ?Gait/Transferring ?Normal/ bed rest/ wheelchair 0 Yes ?Weak (short steps with or without shuffle, stooped but able to lift head while walking, may seek  0 No ?support from furniture) ?Impaired (short steps with shuffle, may have difficulty arising from chair, head down, impaired 0 No ?balance) ?Mental Status ?Oriented to own ability 0 Yes ?Electronic Signature(s) ?Signed: 11/28/2021 6:01:57 PM By: Zandra Abts RN, BSN ?Entered By: Zandra Abts on 11/28/2021 14:05:59 ?-------------------------------------------------------------------------------- ?Nutrition Risk Screening Details ?Patient Name: ?Date of Service: ?[name] [name], [name] [name] 11/28/2021 1:30 PM ?Medical Record Number: [id number] ?Patient Account Number: [id number] ?Date of Birth/Sex: ?Treating RN: ?Apr 29, 1971 (51 y.o. Female) Zandra Abts ?Primary Care Soraiya Ahner: Theophilus Kinds ?Other Clinician: ?Referring Ivo Moga: ?Treating Chavon Lucarelli/Extender: Duanne Guess ?Weeks in Treatment: 0 ?Height (in): 62 ?Weight (lbs): 235 ?Body Mass Index (BMI): 43 ?Nutrition Risk Screening Items ?Score Screening ?NUTRITION RISK SCREEN: ?I have an illness or condition that made me change the kind and/or amount of food I eat 2 Yes ?I eat fewer than two meals per day 0 No ?I eat [name] [name] fruits and vegetables, or milk products 0 No ?I have three or more drinks of beer, liquor or wine almost every day 0 No ?I have tooth or mouth problems that make it hard for me to  eat 0 No ?I don't always have enough money to buy the food I need 0 No ?I eat alone most of the time 0 No ?I take three or more different prescribed or over-the-counter drugs a day 1 Yes ?Without wanting to, I have lost or gained 10 pounds in the last six months 0 No ?I am not always physically able to shop, cook and/or feed myself 0 No ?Nutrition Protocols ?Good Risk Protocol ?Moderate Risk Protocol 0 Provide education on nutrition ?High Risk Proctocol ?Risk Level: Moderate Risk ?Score: 3 ?Electronic Signature(s) ?Signed: 11/28/2021 6:01:57 PM By: Zandra Abts RN, BSN ?Entered By: Zandra Abts on 11/28/2021 14:06:16 ?

## 2021-11-28 NOTE — Progress Notes (Signed)
[name] [name], [name] [name] ([id number]) ?Visit Report for 11/28/2021 ?Allergy List Details ?Patient Name: Date of Service: ?[name] [name], [name] [name] 11/28/2021 1:30 PM ?Medical Record Number: [id number] ?Patient Account Number: [id number] ?Date of Birth/Sex: Treating RN: ?Dec 14, 1970 (51 y.o. Female) Zandra Abts ?Primary Care Tanayia Wahlquist: Theophilus Kinds Other Clinician: ?Referring Lassie Demorest: ?Treating [name] [name]: [name] [name] ?Weeks in Treatment: 0 ?Allergies ?Active Allergies ?No Known Allergies ?Allergy Notes ?Electronic Signature(s) ?Signed: 11/28/2021 6:01:57 PM By: Zandra Abts RN, BSN ?Entered By: Zandra Abts on 11/28/2021 13:59:12 ?-------------------------------------------------------------------------------- ?Arrival Information Details ?Patient Name: Date of Service: ?[name] [name], [name] [name] 11/28/2021 1:30 PM ?Medical Record Number: [id number] ?Patient Account Number: [id number] ?Date of Birth/Sex: Treating RN: ?1970/12/13 (51 y.o. Female) Zandra Abts ?Primary Care Alaina Donati: Theophilus Kinds Other Clinician: ?Referring Sadonna Kotara: ?Treating Kostas Marrow/Extender: [name] [name] ?Weeks in Treatment: 0 ?Visit Information ?Patient Arrived: Ambulatory ?Arrival Time: 13:55 ?Accompanied By: son ?Transfer Assistance: None ?Patient Identification Verified: Yes ?Secondary Verification Process Completed: Yes ?Patient Requires Transmission-Based Precautions: No ?Patient Has Alerts: No ?History Since Last Visit ?Added or deleted any medications: No ?Any new allergies or adverse reactions: No ?Had a fall or experienced change in activities of daily living that may affect risk of falls: No ?Signs or symptoms of abuse/neglect since last visito No ?Hospitalized since last visit: No ?Implantable device outside of the clinic excluding cellular tissue based products placed in the center since last visit: No ?Has Dressing in Place as Prescribed: Yes ?Pain Present Now: Yes ?Electronic Signature(s) ?Signed: 11/28/2021 6:01:57 PM By: Zandra Abts RN,  BSN ?Entered By: Zandra Abts on 11/28/2021 13:57:41 ?-------------------------------------------------------------------------------- ?Clinic Level of Care Assessment Details ?Patient Name: Date of Service: ?[name] [name], [name] [name] 11/28/2021 1:30 PM ?Medical Record Number: [id number] ?Patient Account Number: [id number] ?Date of Birth/Sex: Treating RN: ?June 01, 1971 (51 y.o. Female) Zandra Abts ?Primary Care Shanora Christensen: Theophilus Kinds Other Clinician: ?Referring Trumaine Wimer: ?Treating [name] [name]: [name] [name] ?Weeks in Treatment: 0 ?Clinic Level of Care Assessment Items ?TOOL 2 Quantity Score ?X- 1 0 ?Use when only an EandM is performed on the INITIAL visit ?ASSESSMENTS - Nursing Assessment / Reassessment ?X- 1 20 ?General Physical Exam (combine w/ comprehensive assessment (listed just below) when performed on new pt. evals) ?X- 1 25 ?Comprehensive Assessment (HX, ROS, Risk Assessments, Wounds Hx, etc.) ?ASSESSMENTS - Wound and Skin A ssessment / Reassessment ?[]  - 0 ?Simple Wound Assessment / Reassessment - one wound ?[]  - 0 ?Complex Wound Assessment / Reassessment - multiple wounds ?[]  - 0 ?Dermatologic / Skin Assessment (not related to wound area) ?ASSESSMENTS - Ostomy and/or Continence Assessment and Care ?[]  - 0 ?Incontinence Assessment and Management ?[]  - 0 ?Ostomy Care Assessment and Management (repouching, etc.) ?PROCESS - Coordination of Care ?X - Simple Patient / Family Education for ongoing care 1 15 ?[]  - 0 ?Complex (extensive) Patient / Family Education for ongoing care ?X- 1 10 ?Staff obtains Consents, Records, T Results / Process Orders ?est ?[]  - 0 ?Staff telephones HHA, Nursing Homes / Clarify orders / etc ?[]  - 0 ?Routine Transfer to another Facility (non-emergent condition) ?[]  - 0 ?Routine Hospital Admission (non-emergent condition) ?[]  - 0 ?New Admissions / / Ordering NPWT Apligraf, etc. ?, ?[]  - 0 ?Emergency Hospital Admission (emergent condition) ?X- 1 10 ?Simple  Discharge Coordination ?[]  - 0 ?Complex (extensive) Discharge Coordination ?PROCESS - Special Needs ?[]  - 0 ?Pediatric / Minor Patient Management ?[]  - 0 ?Isolation Patient Management ?[]  - 0 ?Hearing / Language / Visual special needs ?[]  - 0 ?Assessment of Community assistance (transportation, D/C planning, etc.) ?[]  - 0 ?Additional assistance /  Altered mentation ?[]  - 0 ?Support Surface(s) Assessment (bed, cushion, seat, etc.) ?INTERVENTIONS - Wound Cleansing / Measurement ?[]  - 0 ?Wound Imaging (photographs - any number of wounds) ?[]  - 0 ?Wound Tracing (instead of photographs) ?[]  - 0 ?Simple Wound Measurement - one wound ?[]  - 0 ?Complex Wound Measurement - multiple wounds ?[]  - 0 ?Simple Wound Cleansing - one wound ?[]  - 0 ?Complex Wound Cleansing - multiple wounds ?INTERVENTIONS - Wound Dressings ?[]  - 0 ?Small Wound Dressing one or multiple wounds ?[]  - 0 ?Medium Wound Dressing one or multiple wounds ?[]  - 0 ?Large Wound Dressing one or multiple wounds ?[]  - 0 ?Application of Medications - injection ?INTERVENTIONS - Miscellaneous ?[]  - 0 ?External ear exam ?[]  - 0 ?Specimen Collection (cultures, biopsies, blood, body fluids, etc.) ?[]  - 0 ?Specimen(s) / Culture(s) sent or taken to Lab for analysis ?[]  - 0 ?Patient Transfer (multiple staff / / Similar devices) ?[]  - 0 ?Simple Staple / Suture removal (25 or less) ?[]  - 0 ?Complex Staple / Suture removal (26 or more) ?[]  - 0 ?Hypo / Hyperglycemic Management (close monitor of Blood Glucose) ?[]  - 0 ?Ankle / Brachial Index (ABI) - do not check if billed separately ?Has the patient been seen at the hospital within the last three years: Yes ?Total Score: 80 ?Level Of Care: New/Established - Level 3 ?Electronic Signature(s) ?Signed: 11/28/2021 6:01:57 PM By: RN, BSN ?Entered By: on 11/28/2021 17:10:56 ?-------------------------------------------------------------------------------- ?Encounter Discharge Information  Details ?Patient Name: Date of Service: ?[name] [name], [name] [name] 11/28/2021 1:30 PM ?Medical Record Number: ?Patient Account Number: ?Date of Birth/Sex: Treating RN: ?Apr 02, 1971 (51 y.o. Female) ?Primary Care Khiyan Crace: Other Clinician: ?Referring Micaela Stith: ?Treating Chonte Ricke/Extender: ?Weeks in Treatment: 0 ?Encounter Discharge Information Items ?Discharge Condition: Stable ?Ambulatory Status: Ambulatory ?Discharge Destination: Home ?Transportation: Private Auto ?Accompanied By: son ?Schedule Follow-up Appointment: Yes ?Clinical Summary of Care: Patient Declined ?Electronic Signature(s) ?Signed: 11/28/2021 6:01:57 PM By: Nurse, adult RN, BSN ?Entered By: on 11/28/2021 17:11:30 ?-------------------------------------------------------------------------------- ?Multi Wound Chart Details ?Patient Name: Date of Service: ?[name] [name], [name] [name] 11/28/2021 1:30 PM ?Medical Record Number: 01/28/2022 ?Patient Account Number: Zandra Abts ?Date of Birth/Sex: ?Treating RN: ?19-Feb-1971 (51 y.o. Female) ?Primary Care Letricia Krinsky: Elayne Guerin ?Other Clinician: ?Referring Senai Ramnath: ?Treating Dmiya Malphrus/Extender: 01/28/2022 ?Weeks in Treatment: 0 ?Vital Signs ?Height(in): 62 ?Capillary Blood Glucose(mg/dl): 619509326 ?Weight(lbs): 235 ?Pulse(bpm): 81 ?Body Mass Index(BMI): 43 ?Blood Pressure(mmHg): 130/83 ?Temperature(??F): 98.2 ?Respiratory Rate(breaths/min): 18 ?Wound Assessments ?Treatment Notes ?Electronic Signature(s) ?Signed: 11/28/2021 2:19:55 PM By: 03/14/1971 MD FACS ?Entered By: (47 on 11/28/2021 14:19:55 ?-------------------------------------------------------------------------------- ?Pain Assessment Details ?Patient Name: ?Date of Service: ?[name] [name], [name] [name] 11/28/2021 1:30 PM ?Medical Record Number: 01/28/2022 ?Patient Account Number: Zandra Abts ?Date of Birth/Sex: ?Treating RN: ?[birth year] (51 y.o. Female) Elayne Guerin ?Primary Care Chellsea Beckers:  01/28/2022 ?Other Clinician: ?Referring Deangleo Passage: ?Treating Jayke Caul/Extender: [phone] ?Weeks in Treatment: 0 ?Active Problems ?Location of Pain Severity and Description of Pain ?Patient Has Paino No ?Site Locations ?Pain

## 2021-11-29 NOTE — Progress Notes (Signed)
[name], [name] ([id number]) ?Visit Report for 11/28/2021 ?Chief Complaint Document Details ?Patient Name: Date of Service: ?[name], [name] 11/28/2021 1:30 PM ?Medical Record Number: [id number] ?Patient Account Number: [id number] ?Date of Birth/Sex: Treating RN: ?1970/10/16 (51 y.o. Female) ?Primary Care Provider: Corena [name] Other Clinician: ?Referring Provider: ?Treating Provider/Extender: [name] [name] ?Weeks in Treatment: 0 ?Information Obtained from: Patient ?Chief Complaint ?01/31/2021; patient is here for review of a very large surgical wound on the left buttock ?09/17/21: patient is here for ongoing management of her surgical wound ?Electronic Signature(s) ?Signed: 11/28/2021 2:20:03 PM By: [name] Maudlin MD FACS ?Entered By: [name] [name] on 11/28/2021 14:20:03 ?-------------------------------------------------------------------------------- ?HPI Details ?Patient Name: Date of Service: ?[name], [name] 11/28/2021 1:30 PM ?Medical Record Number: [id number] ?Patient Account Number: [id number] ?Date of Birth/Sex: Treating RN: ?Dec 29, 1970 (51 y.o. Female) ?Primary Care Provider: Corena [name] Other Clinician: ?Referring Provider: ?Treating Provider/Extender: [name] [name] ?Weeks in Treatment: 0 ?History of Present Illness ?HPI Description: ADMISSION ?12/31/2020 ?This is a 51 year old woman with poorly controlled type 2 diabetes on insulin. Her problem began after a fall in late April. She developed a pimple area on her ?left buttock that rapidly expanded. She was admitted to hospital from 12/28/2020 through 01/19/2021 initially I believed to [facility] subsequently ?transferred to [facility] when she developed clear septic shock. 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 Her wound culture ?grew grew group B strep. She required operative debridements on 6/3 and 6/6 and subsequent bedside debridements as well. I have another culture result that ?showed E. coli. She was discharged  on cephalexin and Augmentin she is long since finished this. She is now at home using Silvadene cream covered with ?ABDs. ?Past medical history includes new onset A. fib on Eliquis type 2 diabetes on insulin with a recent hemoglobin A1c of 11.1. As far as I can tell she is eating and ?drinking well she is taking Ensure at home. She has had significant weight loss dating back prior to this infection I am not exactly sure of the cause here ?8/15; patient presents for follow-up. Its been 1 month since patient has last been seen. She is currently using Dakin's wet-to-dry dressings. She has no issues ?or complaints today. She denies signs of infection. ?8/30; this is a patient have not seen in almost 2 months. She has a pressure ulcer on her left buttock and reasonably close proximity to her anal opening. Most ?of this appears fairly superficial however most proximally there is an area with more depth. She apparently has poorly controlled diabetes. She has been using ?Dakin's wet-to-dry for about 7 weeks now. She is changing this multiple times a day her son does the dressing. She says out of concern for urine but this does ?not look like an area that should be getting contaminated with urine. She claims not to be incontinent. She has a level 3 pressure relief surface I think by her ?description ?9/13; we attempted to get this patient Medihoney to aid in surface debridement however she still has not gotten this to date and has been using wet-to-dry. ?9/27; patient is using Medihoney gauze to the wound surface. They are apparently changing this 2-3 times a day due to difficulty keeping dressings in place. ?This is on the left buttock in close proximity to the gluteal cleft. The wound measures smaller and certainly has a much cleaner surface than I was expecting to ?see. ?10/11; patient presents for follow-up. She has been using Medihoney daily to the wound surface.  She has no issues or complaints today. She tries  to ?aggressively offload the area. She denies signs of infection. ?10/25; wound surface looks healthier today. I am going to try to use silver alginate. Medihoney is cleaned up the surface of the wound ?11/1; this continues to contract at his steady albeit slow rate. Healthy looking granulation we have been using silver alginate we have given her pieces of this ?because of her status has Medicaid changing every 2 days. There is no evidence of concurrent infection. Initially this was because of a necrotizing soft tissue ?infection ?11/16; wound continues to contract. We have been using silver alginate she is changing this every 2 days. ?She comes into clinic today asking me about right leg weakness when she is walking. Its not really clear to me when this began but it seems sometime either ?after she came home from the hospital in June or thereafter. She has a walker. She does not complain of sensory loss. She tells me that she "was told she did ?not have a stroke" ?12/7; not really much change in her wound surface area. Using silver alginate and she is changing this twice a day for reasons that I think are most to do with ?sanitation of the dressing. In any case there is not a lot of options here. I was hoping to change to [facility] at some point but we certainly could not ?supply enough Hydrofera Blue to change this twice a day. ?1/24; the wound is split into 2 proximal and distal. The proximal 1 still has more depth there is healing between the 2. Using silver alginate they are changing it ?twice a day ?09/17/2021: Both wounds continue to heal. The more cranial continues to be slightly deeper than the caudal. They continue to use silver alginate and are ?changing it once a day. ?CONSULT ONLY ?11/28/2021 ?After multiple no-shows, the patient was discharged from our clinic only to be put back on the schedule today. Fortunately, her wound has healed. ?Electronic Signature(s) ?Signed: 11/28/2021 2:20:43 PM By: [name] Maudlin MD FACS ?Entered By: [name] [name] on 11/28/2021 14:20:43 ?-------------------------------------------------------------------------------- ?Physical Exam Details ?Patient Name: Date of Service: ?[name], [name] 11/28/2021 1:30 PM ?Medical Record Number: [id number] ?Patient Account Number: [id number] ?Date of Birth/Sex: Treating RN: ?[birth year] (51 y.o. Female) ?Primary Care Provider: Corena [name] Other Clinician: ?Referring Provider: ?Treating Provider/Extender: [name] [name] ?Weeks in Treatment: 0 ?Constitutional ?. . . . No acute distress. ?Respiratory ?Normal work of breathing on room air.Marland Kitchen ?Notes ?11/28/2021: Her wound is closed. ?Electronic Signature(s) ?Signed: 11/28/2021 2:21:57 PM By: [name] Maudlin MD FACS ?Entered By: [name] [name] on 11/28/2021 14:21:57 ?-------------------------------------------------------------------------------- ?Physician Orders Details ?Patient Name: Date of Service: ?[name], [name] 11/28/2021 1:30 PM ?Medical Record Number: [id number] ?Patient Account Number: [id number] ?Date of Birth/Sex: Treating RN: ?[birth year] (51 y.o. Female) Levan Hurst ?Primary Care Provider: Corena [name] Other Clinician: ?Referring Provider: ?Treating Provider/Extender: [name] [name] ?Weeks in Treatment: 0 ?Verbal / Phone Orders: No ?Diagnosis Coding ?ICD-10 Coding ?Code Description ?Z87.2 Personal history of diseases of the skin and subcutaneous tissue ?Discharge From [facility] ?Discharge from [place] healed, congratulations!! ?Electronic Signature(s) ?Signed: 11/28/2021 2:23:01 PM By: [name] Maudlin MD FACS ?Entered By: [name] [name] on 11/28/2021 14:23:00 ?-------------------------------------------------------------------------------- ?Problem List Details ?Patient Name: ?Date of Service: ?[name], [name] 11/28/2021 1:30 PM ?Medical Record Number: [id number] ?Patient Account Number: [id number] ?Date of Birth/Sex: ?Treating RN: ?1970-10-20 (51 y.o. Female) ?Primary  Care Provider: Corena [name] ?Other Clinician: ?Referring Provider: ?Treating Provider/Extender: [name] [name] ?  Weeks in Treatment: 0 ?Active Problems ?ICD-10 ?Encounter ?Code Description Active Date MDM

## 2022-03-02 IMAGING — DX DG CHEST 1V PORT
1 series · 1 of 1 positions shown · non-contrast
Comparison: December 28, 2020 study obtained earlier in the day

CLINICAL DATA: Hypoxia

EXAM:
PORTABLE CHEST 1 VIEW

[chest]
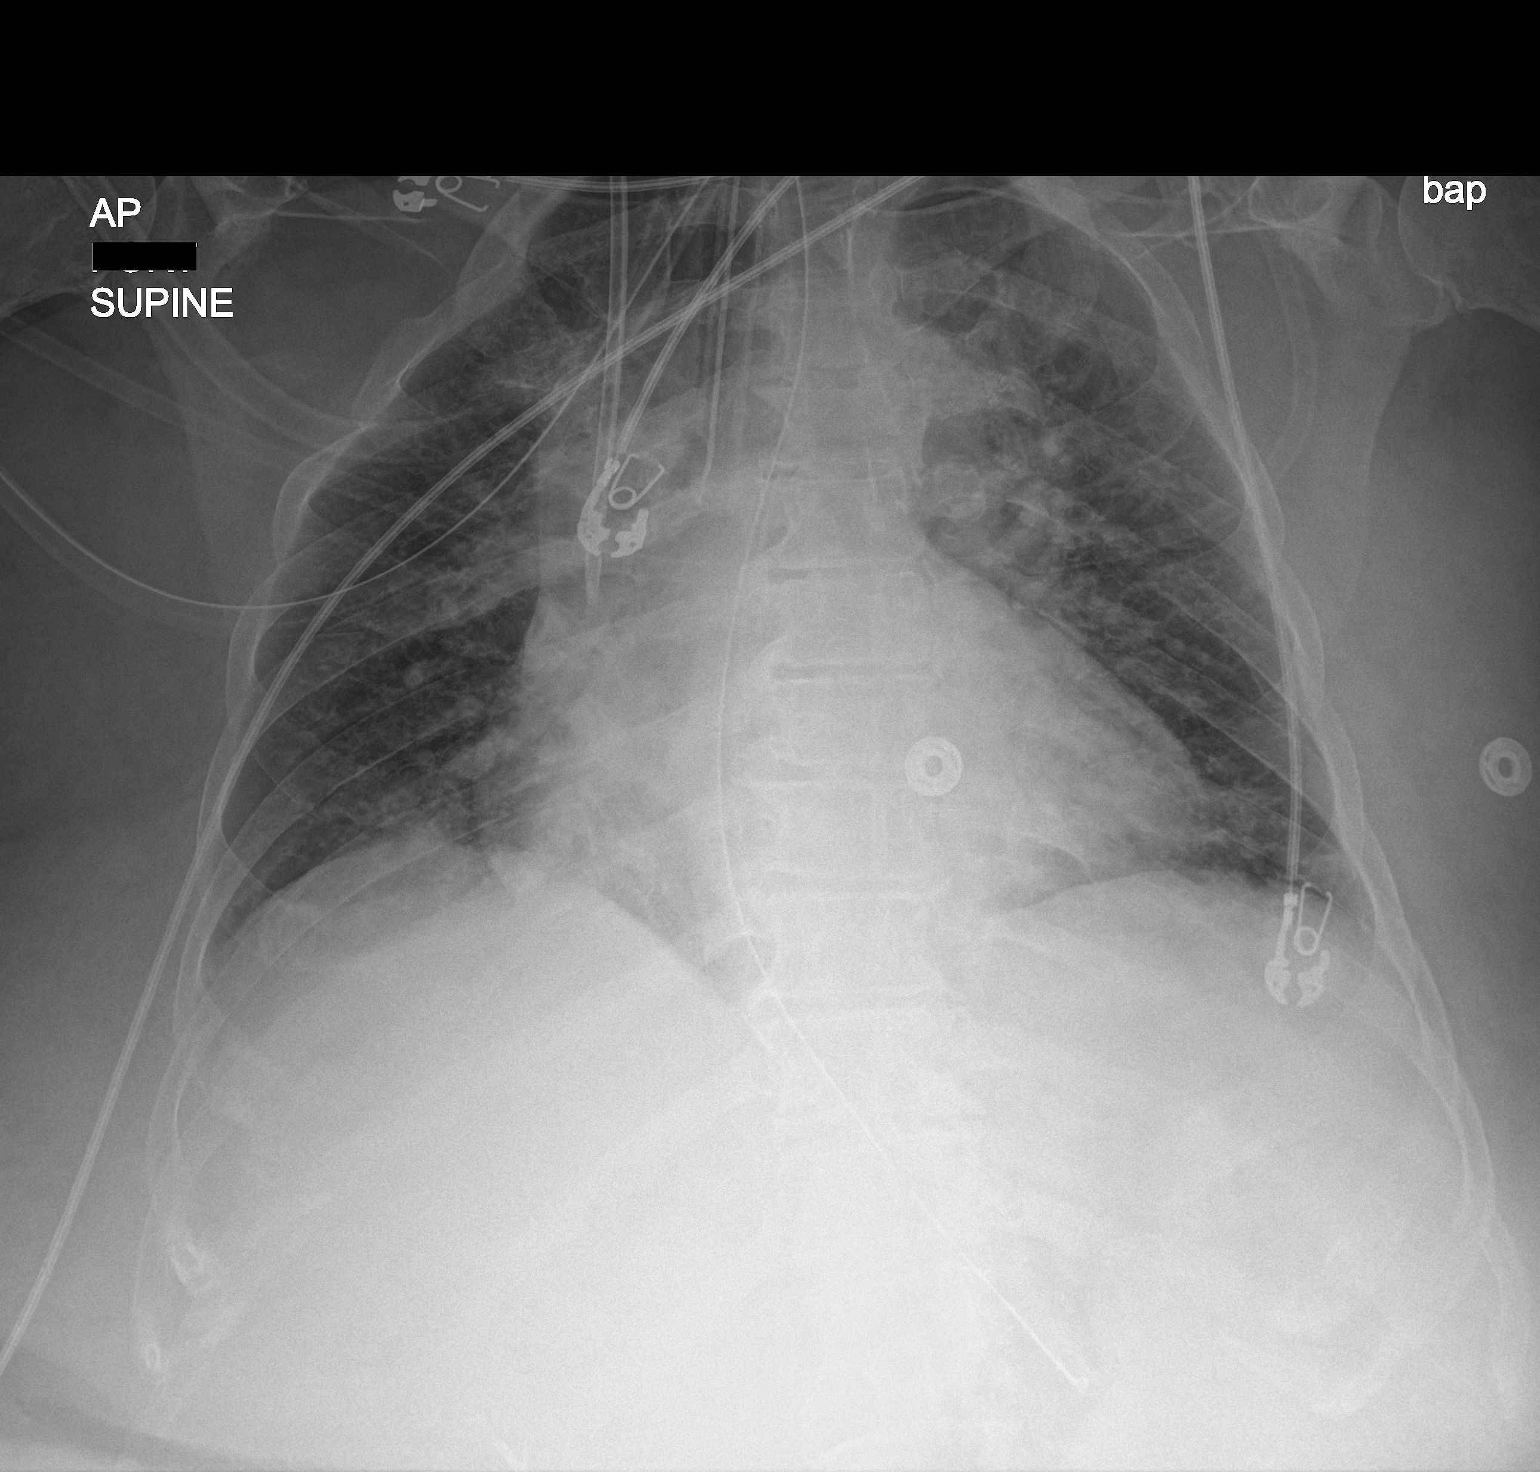

[1 of 1 positions shown; findings below may reference images not displayed]

FINDINGS: Endotracheal tube tip is in the proximal right main bronchus.
Nasogastric tube tip and side port are in the stomach. Central
catheter tip is in the superior vena cava. No pneumothorax. There is
mild left base atelectasis. No edema or airspace opacity. There is
stable cardiomegaly with pulmonary vascularity normal. No
adenopathy. No bone lesions.
IMPRESSION: Tube and catheter positions as described without pneumothorax. The
endotracheal tube tip is in the proximal right main bronchus. Advise
withdrawing endotracheal tube approximately 4 cm.

Mild left base atelectasis. No edema or consolidation. Stable
cardiomegaly.

Critical Value/emergent results were called by telephone at the time
of interpretation on 12/28/2020 at [DATE] to provider Mosely Ver,
RN, who verbally acknowledged these results.

## 2022-03-14 IMAGING — CT CT HEAD W/O CM
3 of 5 series · 15 of 47 positions shown, 18 images · non-contrast
Comparison: October 10, 2007

CLINICAL DATA: Pain following fall

EXAM:
CT HEAD WITHOUT CONTRAST
TECHNIQUE: Contiguous axial images were obtained from the base of the skull
through the vertex without intravenous contrast.

[Series 4: head 2.0 h70h · axial · 0.43mm/px · z∈[-125,+5]mm · 9 of 79 slices shown, 12 images]
[im 9/79  brain]
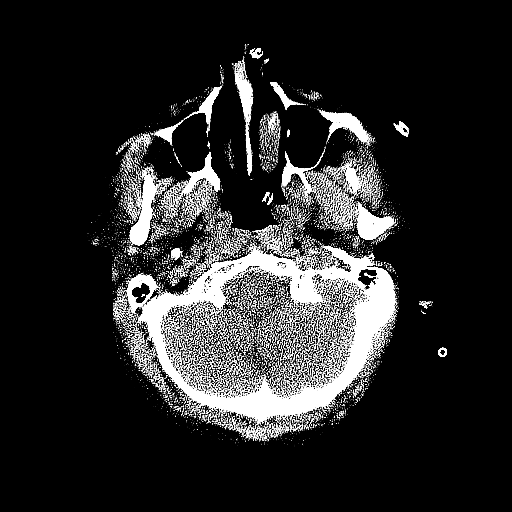
[im 9/79  bone]
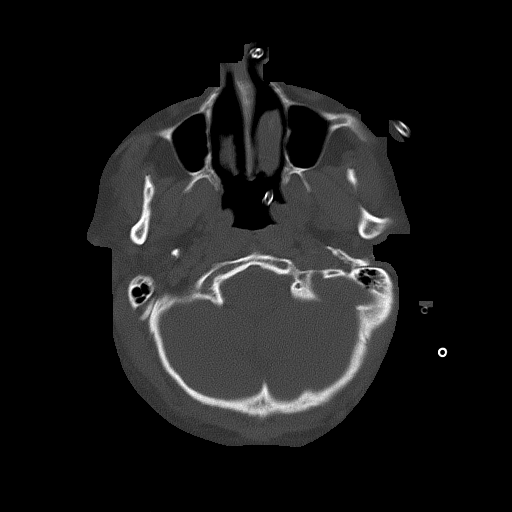
[im 17/79  brain]
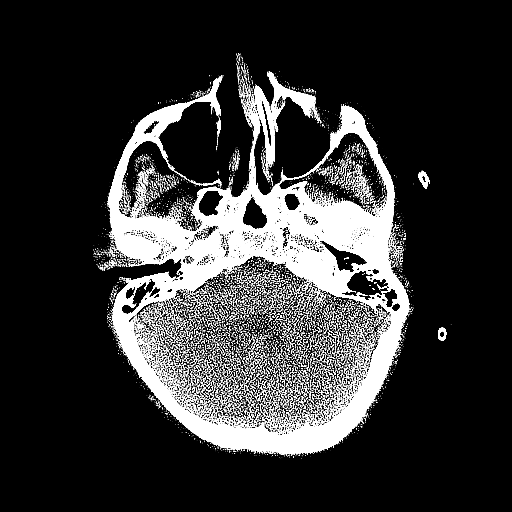
[im 25/79  brain]
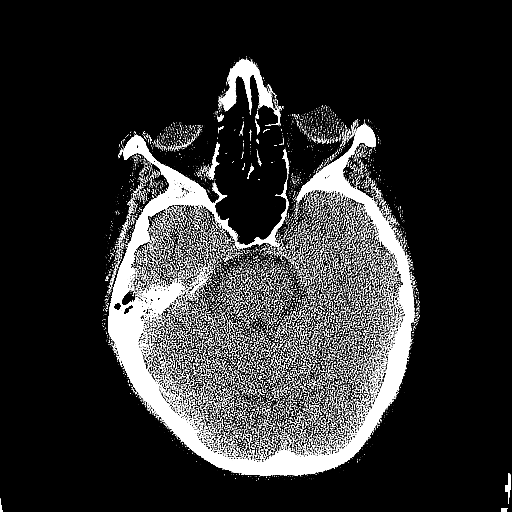
[im 33/79  brain]
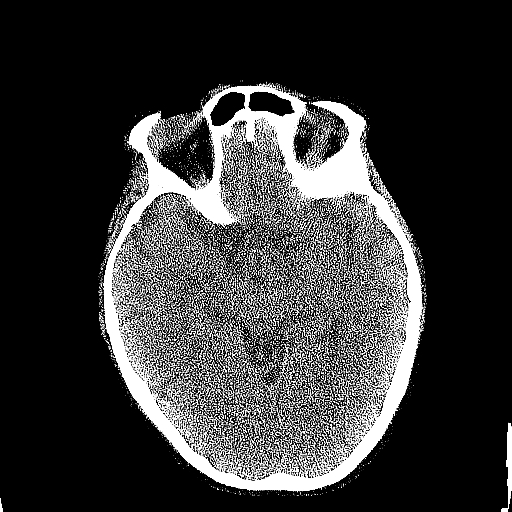
[im 42/79  brain]
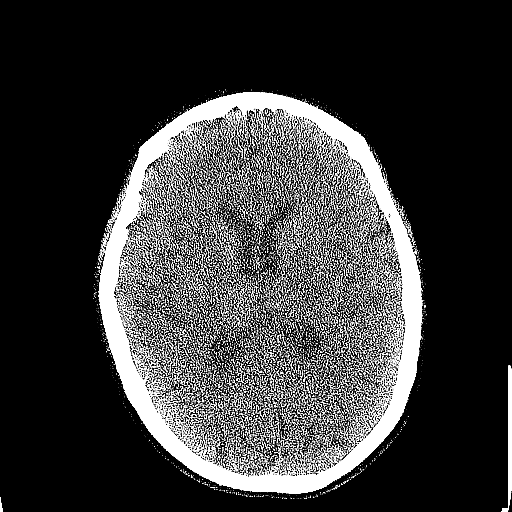
[im 42/79  bone]
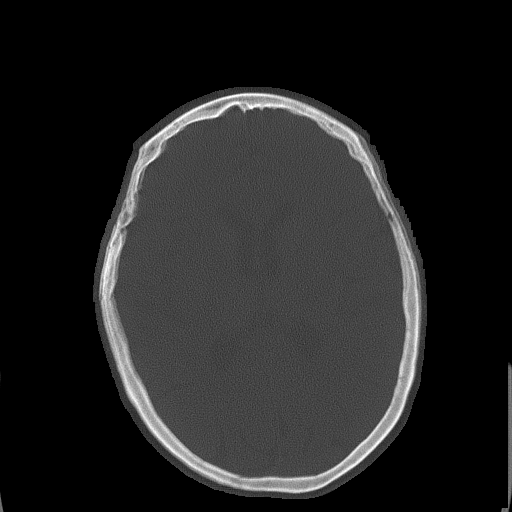
[im 50/79  brain]
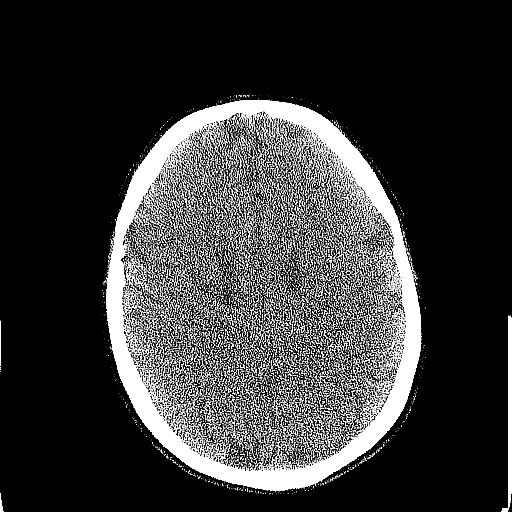
[im 58/79  brain]
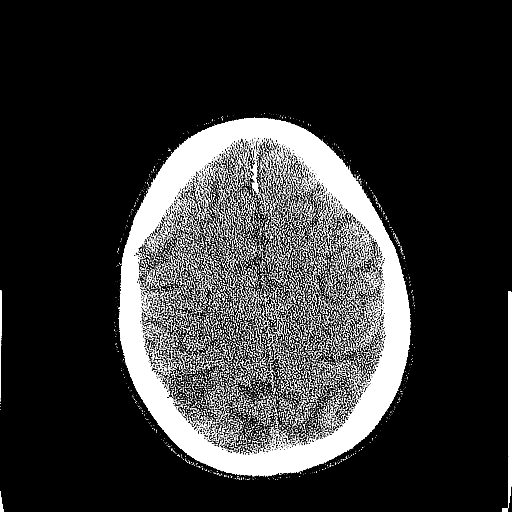
[im 66/79  brain]
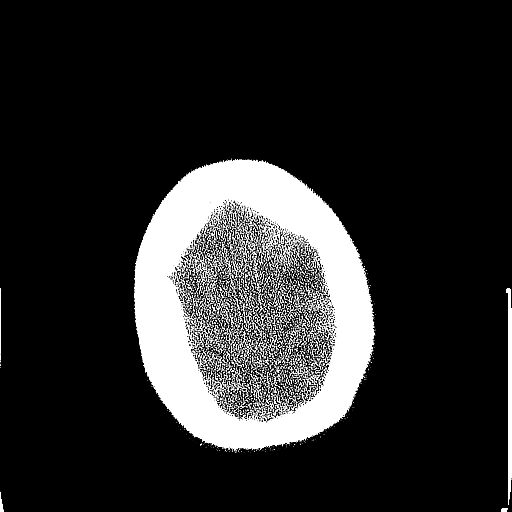
[im 74/79  brain]
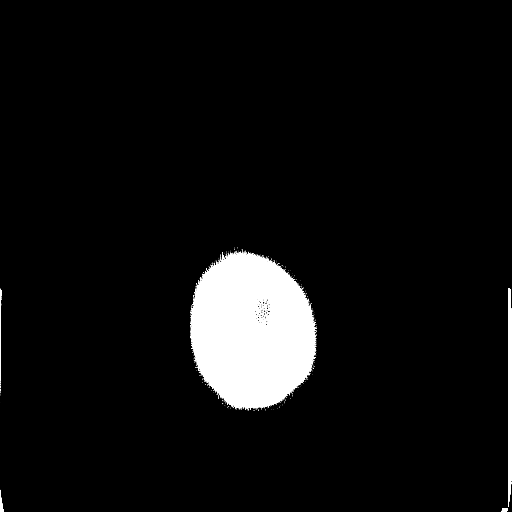
[im 74/79  bone]
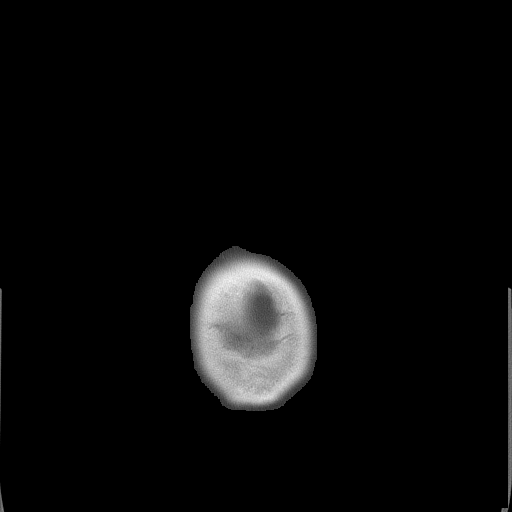

[Series 5: head 3.0 mpr cor · coronal · 0.31mm/px · 3 of 73 slices shown]
[im 25/73  brain]
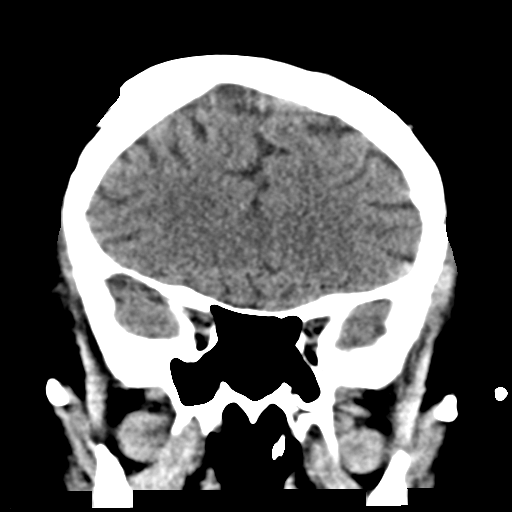
[im 33/73  brain]
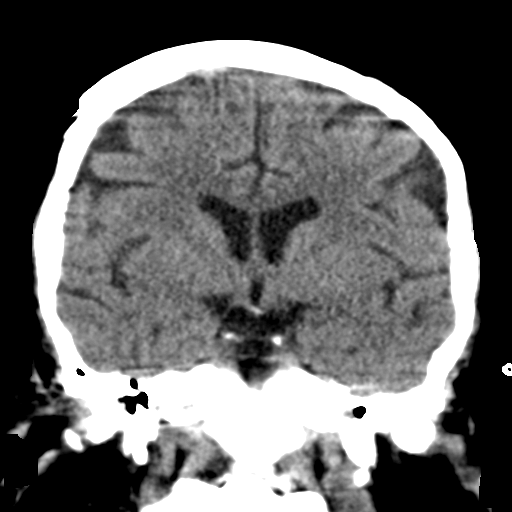
[im 41/73  brain]
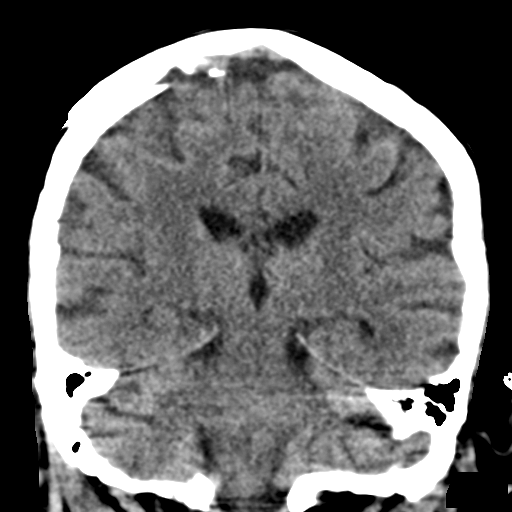

[Series 6: head 3.0 mpr sag · sagittal · 0.31mm/px · 3 of 67 slices shown]
[im 23/67  brain]
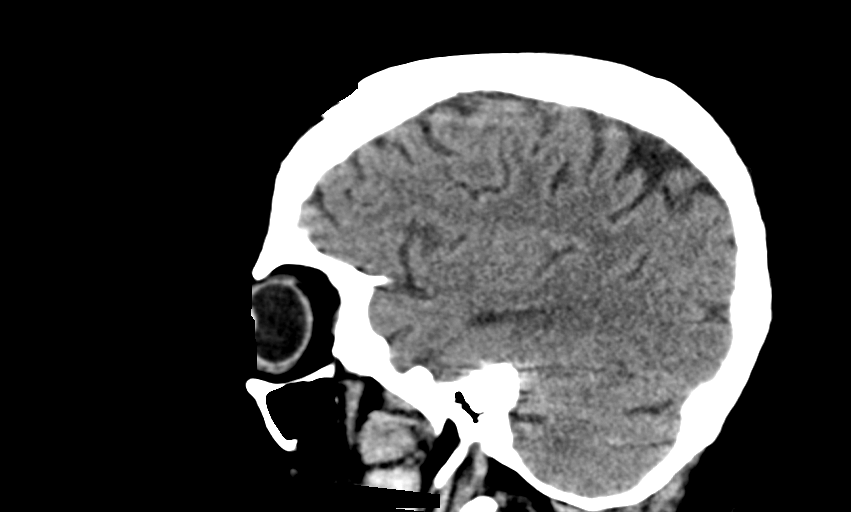
[im 34/67  brain]
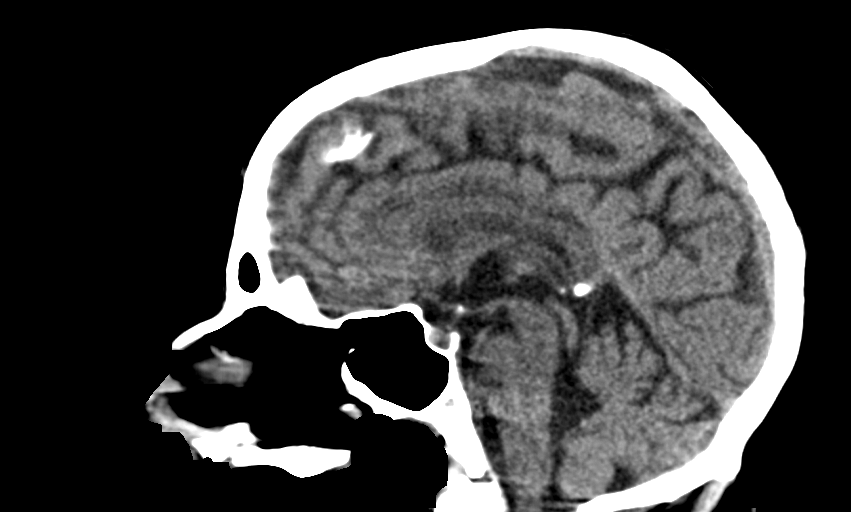
[im 45/67  brain]
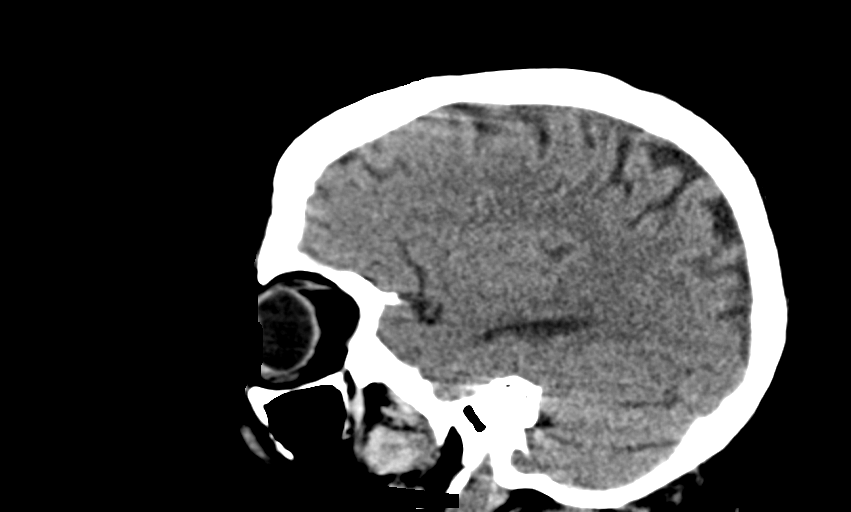

[15 of 47 positions shown; findings below may reference images not displayed]

FINDINGS: Brain: Ventricles and sulci are normal in size and configuration.
There is no intracranial mass, hemorrhage, extra-axial fluid
collection, or midline shift. Brain parenchyma appears unremarkable.
No appreciable acute infarct.

Vascular: No hyperdense vessel. Mild calcification in the carotid
siphon regions.

Skull: Bony calvarium appears intact.

Sinuses/Orbits: Paranasal sinuses are clear. Orbits appear symmetric
bilaterally.

Other: Mastoid air cells clear.
IMPRESSION: Normal appearing brain parenchyma. No mass, hemorrhage, extra-axial
fluid collection. Mild arterial vascular calcification noted.

## 2024-09-06 ENCOUNTER — Encounter (HOSPITAL_BASED_OUTPATIENT_CLINIC_OR_DEPARTMENT_OTHER): Admitting: General Surgery
